# Patient Record
Sex: Female | Born: 1950
Health system: Southern US, Community
[De-identification: ages and names within clinical notes are randomized; demographics above are authoritative.]

## PROBLEM LIST (undated history)

## (undated) DIAGNOSIS — Z8042 Family history of malignant neoplasm of prostate: Secondary | ICD-10-CM

## (undated) DIAGNOSIS — C569 Malignant neoplasm of unspecified ovary: Secondary | ICD-10-CM

## (undated) DIAGNOSIS — J329 Chronic sinusitis, unspecified: Secondary | ICD-10-CM

## (undated) DIAGNOSIS — M779 Enthesopathy, unspecified: Secondary | ICD-10-CM

## (undated) DIAGNOSIS — M1990 Unspecified osteoarthritis, unspecified site: Secondary | ICD-10-CM

## (undated) DIAGNOSIS — C561 Malignant neoplasm of right ovary: Secondary | ICD-10-CM

## (undated) DIAGNOSIS — H04123 Dry eye syndrome of bilateral lacrimal glands: Secondary | ICD-10-CM

## (undated) DIAGNOSIS — K469 Unspecified abdominal hernia without obstruction or gangrene: Secondary | ICD-10-CM

## (undated) DIAGNOSIS — G629 Polyneuropathy, unspecified: Secondary | ICD-10-CM

## (undated) DIAGNOSIS — T451X5A Adverse effect of antineoplastic and immunosuppressive drugs, initial encounter: Principal | ICD-10-CM

## (undated) DIAGNOSIS — I2699 Other pulmonary embolism without acute cor pulmonale: Secondary | ICD-10-CM

## (undated) DIAGNOSIS — Z8051 Family history of malignant neoplasm of kidney: Secondary | ICD-10-CM

## (undated) DIAGNOSIS — I10 Essential (primary) hypertension: Secondary | ICD-10-CM

## (undated) DIAGNOSIS — D6481 Anemia due to antineoplastic chemotherapy: Secondary | ICD-10-CM

## (undated) HISTORY — DX: Essential (primary) hypertension: I10

## (undated) HISTORY — DX: Chronic sinusitis, unspecified: J32.9

## (undated) HISTORY — DX: Family history of malignant neoplasm of prostate: Z80.42

## (undated) HISTORY — DX: Anemia due to antineoplastic chemotherapy: D64.81

## (undated) HISTORY — PX: ROTATOR CUFF REPAIR: SHX139

## (undated) HISTORY — DX: Enthesopathy, unspecified: M77.9

## (undated) HISTORY — DX: Malignant neoplasm of unspecified ovary: C56.9

## (undated) HISTORY — DX: Malignant neoplasm of right ovary: C56.1

## (undated) HISTORY — DX: Adverse effect of antineoplastic and immunosuppressive drugs, initial encounter: T45.1X5A

## (undated) HISTORY — DX: Polyneuropathy, unspecified: G62.9

## (undated) HISTORY — DX: Family history of malignant neoplasm of kidney: Z80.51

## (undated) HISTORY — DX: Unspecified abdominal hernia without obstruction or gangrene: K46.9

## (undated) HISTORY — PX: TONSILLECTOMY: SUR1361

## (undated) HISTORY — DX: Other pulmonary embolism without acute cor pulmonale: I26.99

---

## 1979-01-05 HISTORY — PX: TUBAL LIGATION: SHX77

## 1988-01-05 HISTORY — PX: CHOLECYSTECTOMY: SHX55

## 2002-11-15 ENCOUNTER — Ambulatory Visit (HOSPITAL_COMMUNITY): Admission: RE | Admit: 2002-11-15 | Discharge: 2002-11-15 | Payer: Self-pay | Admitting: Orthopaedic Surgery

## 2006-03-15 ENCOUNTER — Emergency Department (HOSPITAL_COMMUNITY): Admission: EM | Admit: 2006-03-15 | Discharge: 2006-03-15 | Payer: Self-pay | Admitting: Emergency Medicine

## 2008-09-12 ENCOUNTER — Ambulatory Visit (HOSPITAL_COMMUNITY): Admission: RE | Admit: 2008-09-12 | Discharge: 2008-09-12 | Payer: Self-pay | Admitting: Orthopaedic Surgery

## 2010-12-09 ENCOUNTER — Telehealth: Payer: Self-pay

## 2010-12-09 NOTE — Telephone Encounter (Signed)
LMOM for pt to call. 

## 2010-12-22 NOTE — Telephone Encounter (Signed)
Letter mailed to call.  

## 2010-12-23 ENCOUNTER — Ambulatory Visit: Payer: Self-pay | Admitting: Gastroenterology

## 2011-01-06 ENCOUNTER — Ambulatory Visit: Payer: Self-pay | Admitting: Urgent Care

## 2011-01-13 ENCOUNTER — Ambulatory Visit: Payer: Self-pay | Admitting: Urgent Care

## 2011-01-18 ENCOUNTER — Ambulatory Visit: Payer: Self-pay | Admitting: Urgent Care

## 2011-01-21 ENCOUNTER — Other Ambulatory Visit (HOSPITAL_COMMUNITY): Payer: Self-pay | Admitting: Physician Assistant

## 2011-01-21 DIAGNOSIS — Z139 Encounter for screening, unspecified: Secondary | ICD-10-CM

## 2011-01-27 ENCOUNTER — Ambulatory Visit: Payer: Self-pay | Admitting: Gastroenterology

## 2011-01-28 ENCOUNTER — Ambulatory Visit (HOSPITAL_COMMUNITY)
Admission: RE | Admit: 2011-01-28 | Discharge: 2011-01-28 | Disposition: A | Discharge status: Home / Self Care | Payer: BC Managed Care – PPO | Source: Ambulatory Visit | Attending: Physician Assistant | Admitting: Physician Assistant

## 2011-01-28 DIAGNOSIS — Z1231 Encounter for screening mammogram for malignant neoplasm of breast: Secondary | ICD-10-CM | POA: Insufficient documentation

## 2011-01-28 DIAGNOSIS — Z139 Encounter for screening, unspecified: Secondary | ICD-10-CM

## 2011-03-11 ENCOUNTER — Telehealth: Payer: Self-pay

## 2011-03-11 NOTE — Telephone Encounter (Signed)
Letter sent to Dr. Sherwood Gambler in [place] regarding pt's appt. She had 5 appointments for OV scheduled and each one was cancelled.

## 2011-04-21 ENCOUNTER — Telehealth: Payer: Self-pay

## 2011-04-21 NOTE — Telephone Encounter (Signed)
Called pt. She is still not ready to schedule ov for diarrhea and colonoscopy. She is having some GYN problems and going to [place]. When she is ready she will call.

## 2011-05-24 HISTORY — PX: OTHER SURGICAL HISTORY: SHX169

## 2011-05-25 HISTORY — PX: ABDOMINAL HYSTERECTOMY: SHX81

## 2011-06-16 HISTORY — PX: PORTACATH PLACEMENT: SHX2246

## 2011-06-17 ENCOUNTER — Encounter (HOSPITAL_COMMUNITY): Payer: BC Managed Care – PPO | Attending: Hematology and Oncology

## 2011-06-17 ENCOUNTER — Encounter (HOSPITAL_COMMUNITY): Payer: Self-pay

## 2011-06-17 VITALS — BP 111/74 | HR 67 | Temp 98.0°F | Ht 62.0 in | Wt 212.2 lb

## 2011-06-17 DIAGNOSIS — I10 Essential (primary) hypertension: Secondary | ICD-10-CM | POA: Insufficient documentation

## 2011-06-17 DIAGNOSIS — C561 Malignant neoplasm of right ovary: Secondary | ICD-10-CM

## 2011-06-17 DIAGNOSIS — IMO0002 Reserved for concepts with insufficient information to code with codable children: Secondary | ICD-10-CM | POA: Insufficient documentation

## 2011-06-17 DIAGNOSIS — Z86711 Personal history of pulmonary embolism: Secondary | ICD-10-CM | POA: Insufficient documentation

## 2011-06-17 DIAGNOSIS — I2699 Other pulmonary embolism without acute cor pulmonale: Secondary | ICD-10-CM | POA: Insufficient documentation

## 2011-06-17 DIAGNOSIS — C569 Malignant neoplasm of unspecified ovary: Secondary | ICD-10-CM | POA: Insufficient documentation

## 2011-06-17 HISTORY — DX: Malignant neoplasm of right ovary: C56.1

## 2011-06-17 MED ORDER — LIDOCAINE-PRILOCAINE 2.5-2.5 % EX CREA: TOPICAL_CREAM | CUTANEOUS | Status: DC | PRN

## 2011-06-17 MED ORDER — DEXAMETHASONE 4 MG PO TABS: ORAL_TABLET | ORAL | Status: DC

## 2011-06-17 MED ORDER — PREDNISONE (PAK) 10 MG PO TABS: 20.0000 mg | ORAL_TABLET | ORAL | Status: DC

## 2011-06-17 MED ORDER — ONDANSETRON HCL 8 MG PO TABS: ORAL_TABLET | ORAL | Status: DC

## 2011-06-17 NOTE — Patient Instructions (Addendum)
Sarah Walker  [id number]  [facility] Discharge Instructions Sarah Walker  [id number] 1950-01-16 Dr. Glenford Peers    RECOMMENDATIONS MADE BY THE CONSULTANT AND ANY TEST RESULTS WILL BE SENT TO YOUR REFERRING DOCTOR.   EXAM FINDINGS BY MD TODAY AND SIGNS AND SYMPTOMS TO REPORT TO CLINIC OR PRIMARY MD:   Your current list of medications are: Current Outpatient Prescriptions  Medication Sig Dispense Refill  . bisoprolol-hydrochlorothiazide (ZIAC) 5-6.25 MG per tablet Take 1 tablet by mouth daily.      Marland Kitchen HYDROcodone-acetaminophen (NORCO) 5-325 MG per tablet Take 1 tablet by mouth every 8 (eight) hours as needed.      . valsartan (DIOVAN) 80 MG tablet Take 80 mg by mouth daily.      Marland Kitchen enoxaparin (LOVENOX) 150 MG/ML injection Inject 150 mg into the skin daily.       Marland Kitchen warfarin (COUMADIN) 5 MG tablet Starts 6/14         INSTRUCTIONS GIVEN AND DISCUSSED: Please refer to appt list for all appts. Chemo teaching same day as being seen by Dr. Mariel Sleet. Pre and post meds to be called in the same day.   SPECIAL INSTRUCTIONS/FOLLOW-UP:  See above.  I acknowledge that I have been informed and understand all the instructions given to me and received a copy. I do not have any more questions at this time, but understand that I may call the [facility] at [phone] during business hours should I have any further questions or need assistance in obtaining follow-up care.

## 2011-06-17 NOTE — Progress Notes (Signed)
CC:   [name], M.D. Jolayne Haines, M.D. Isabella Stalling II, M.D.  IDENTIFYING STATEMENT:  The patient is a 61 year old woman seen at request of Dr. Marcie Mowers with ovarian cancer.  HISTORY OF PRESENT ILLNESS:  The patient was diagnosed with ovarian cancer and had her initial evaluation and surgery at [facility].  The patient has requested to have treatment closer to home as she lives here in [place].  She is here with her husband to follow up.  In summary, in January 2013, she had presented with the vaginal bleeding.  She was seen by Dr. Regino Schultze at [facility] and a Pap smear at that time was unremarkable.  She was then referred to Dr. Arlana Pouch for further workup.  He performed an endometrial biopsy on 02/02/2011 that showed proliferative phase endometrium but no evidence of malignancy.  She was subsequently placed on a 2-week course of progesterone.  At the followup visit with him, she had complained of pelvic pain with bloating for 2 weeks.  An ultrasound performed at his office had shown enlargement of the right ovary with a complex cyst measuring 16 x 17 x 19 cm in size.  The left ovary was not visualized. The patient was subsequently referred to [facility] for further evaluation.  A CA 125 was elevated.  I do not presently have those results.  She then received a CT scan of the abdomen and pelvis on 05/10/2011 at [facility]  The CT scan did confirm the complex cystic right ovarian mass measuring 17.4 cm x 19 cm x 14 cm. The uterus was normal.  There was a 7 mm left ovarian cyst.  There is no evidence of hydronephrosis.  There was a 5 mm cystic lesion on the anterior superior spleen.  There was no ascites or adenopathy or omental thickening.  The pancreas, adrenal glands and kidneys were within normal limits.  There multiple cystic lesions scattered throughout all lobes of the liver.  The lung bases were clear but  there appeared to be a filling defect in the right lower lobe pulmonary artery.  No definite DVT in the pelvis.  As a result of this, the patient received a chest CT angiography.  This showed bilateral pulmonary emboli with multifocal right lower lobe pulmonary emboli, small right upper lobe pulmonary emboli, lingula and left lower lobe pulmonary emboli.  There was no thoracic adenopathy or pulmonary masses or infiltrates.  The patient was subsequently anticoagulated with Lovenox.  On 05/25/2011, she had an IVC filter placed and then proceeded to receive an exploratory laparotomy with excision of the abdominal pelvic mass with total abdominal hysterectomy, bilateral salpingo-oophorectomy and bilateral pelvic lymph node dissection, right periaortic lymph node dissection and multiple pelvic and abdominal peritoneal biopsies with aspiration of ascites was taken.  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  The capsule was intact.  The cyst was not involved.  Resection margins were negative for carcinoma.  There was no evidence of angiolymphatic invasion, omental or peritoneal metastases.  None of the 17 lymph nodes sampled had evidence of malignancy.  Multiple peritoneal biopsies were negative for carcinoma.  The uterus revealed the cervix with acute and chronic cervicitis, endometrium with simple hyperplasia without atypia and myometrium with adenomyosis.  The patient is recovering well from surgery, has very minimal pain at the surgical incision.  Denies vaginal discharge, is not short of breath and is moving her bowels without difficulty.  She is afebrile.  She is tolerating Lovenox with very minimal complaints.  PAST MEDICAL HISTORY: 1. Hypertension. 2. Status post cholecystectomy. 3. Status post tubal ligation. 4. Status post tonsillectomy. 5. Status post total hysterectomy 05/25/2011 for right ovarian  cancer.  ALLERGIES:  Codeine.  MEDICATIONS:  Ziac 5/6.25 one daily, Lovenox 150 mg daily, Norco 5/325 one daily, Diovan 80 mg daily.  SOCIAL HISTORY:  The patient is married with 2 children, son age 40, daughter age 74.  She lives in [place].  Denies alcohol or tobacco use.  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  FAMILY HISTORY:  Patient's mother had cervical cancer.  Father had renal cancer.  No family history for breast or ovarian cancer.  HEALTH MAINTENANCE:  Due for colonoscopy.  Had a mammogram in January.  REVIEW OF SYSTEMS:  Constitutional:  Denies fever, chills, night sweats, anorexia, or weight loss.  GI:  Denies nausea, vomiting, abdominal pain, diarrhea, melena, hematochezia.  Cardiovascular:  Denies chest pain, PND, orthopnea, ankle swelling.  Respirations:  Denies cough, hemoptysis, wheeze, shortness of breath.  Skin:  No bruising or bleeding.  Neurologic:  Denies headaches, vision changes, extremity weakness.  Besides minimal pain at the surgical site, rest of review of systems negative.  PHYSICAL EXAM:  General:  The patient is a well-appearing, well- nourished, woman in no distress.  Vitals:  Pulse 67, blood pressure 101/74, temperature 98, respirations 20, weight 212 pounds.  HEENT: Head is atraumatic, normocephalic.  Sclerae anicteric.  Pupils equal, round, reactive to light.  Mouth moist without ulcerations, thrush, or lesions.  Neck:  Supple without adenopathy.  Chest:  Clear to both percussion and auscultation.  Chest area revealed a Port-A-Cath recently placed with a little erythema but no discharge.  Cardiovascular:  First and second heart sounds present.  No added sounds or murmurs.  Abdomen: Soft, nontender.  Bowel sounds present.  Well-healed surgical incision. Extremities:  No calf tenderness.  Pulses present symmetrical.  Lymph nodes:  No palpable cervical, axillary, inguinal adenopathy.  CNS: Nonfocal.  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  She is recovering well from surgery.  She presents to discuss and receive adjuvant treatment in the form of carboplatin with an AUC of 5 and Taxol 175 mg/sq m dosed every 3 weeks for approximately 6 cycles.  We discussed the need for therapy to decrease relapse and improve upon survival.  We discussed the logistics and side effects of therapy.  Side effects include but are not limited to infusion-related reactions from Taxol, thus the patient will require to take prednisone 10 mg the night before and the morning off of chemotherapy, both drugs causing myelosuppression, nausea, vomiting, alopecia, dermatologic reactions, diarrhea, renal failure.  She was given literature to review and will also attend chemotherapy teaching. She has already had a Port-A-Cath placed at [facility] and thus will need a chest x-ray to locate placement.  A prescription will be written for antiemetics to be taken after chemotherapy and EMLA cream to be applied to port area prior to therapy.  Chemotherapy has tentatively been scheduled to begin on July 15, 2011.  Prior to that visit, the patient has been scheduled for a followup visit and she will be obtaining a CBC, C-MET and a CA 125.  She also has history of bilateral pulmonary  emboli status post IVC filter placement on 05/24/2011.  She is currently on Lovenox but due to begin Coumadin in a.m.  She tells me that she will be having her PT/INR levels monitored at [facility].  I spent more than half the time coordinating care.    ______________________________ Laurice Record, M.D. LIO/MEDQ  D:  06/17/2011  T:  06/17/2011  Job:  [id number]

## 2011-06-17 NOTE — Progress Notes (Signed)
This office note has been dictated.

## 2011-06-22 ENCOUNTER — Encounter (HOSPITAL_COMMUNITY): Payer: Self-pay | Admitting: Oncology

## 2011-06-22 NOTE — Progress Notes (Signed)
Sarah Walker stating that we get records for her from [facility] so that she does not need to repeat a CXR to confirm port placement as requested by Dr. Dalene Carrow.  Interventional radiology at [facility] contacted [phone]), and report was faxed over from them with the power port-a-cath placement confirmation - a copy was placed in the pt's chart and a copy was sent to be scanned into [facility].

## 2011-06-24 ENCOUNTER — Other Ambulatory Visit (HOSPITAL_COMMUNITY): Payer: Self-pay | Admitting: Oncology

## 2011-06-24 ENCOUNTER — Encounter (HOSPITAL_BASED_OUTPATIENT_CLINIC_OR_DEPARTMENT_OTHER): Payer: BC Managed Care – PPO

## 2011-06-24 DIAGNOSIS — C569 Malignant neoplasm of unspecified ovary: Secondary | ICD-10-CM

## 2011-06-24 DIAGNOSIS — I2699 Other pulmonary embolism without acute cor pulmonale: Secondary | ICD-10-CM

## 2011-06-24 LAB — PROTIME-INR
INR: 2.96 — ABNORMAL HIGH (ref 0.00–1.49)
Prothrombin Time: 31.3 seconds — ABNORMAL HIGH (ref 11.6–15.2)

## 2011-06-24 NOTE — Progress Notes (Signed)
Labs drawn today for pt.  Patient on 5mg .  Call patient on [phone]

## 2011-06-25 ENCOUNTER — Telehealth (HOSPITAL_COMMUNITY): Payer: Self-pay | Admitting: Oncology

## 2011-06-25 NOTE — Telephone Encounter (Signed)
[employer] [phone] ? IF CPT [phone] REQUIRE PER [facility] SERVICES DO NOT REQUIRE PRE Rudy Jew REF# [phone]  April Manson [facility] [phone]

## 2011-06-29 ENCOUNTER — Encounter (HOSPITAL_COMMUNITY): Payer: BC Managed Care – PPO

## 2011-06-29 ENCOUNTER — Other Ambulatory Visit (HOSPITAL_COMMUNITY): Payer: Self-pay | Admitting: Oncology

## 2011-06-29 DIAGNOSIS — I2699 Other pulmonary embolism without acute cor pulmonale: Secondary | ICD-10-CM

## 2011-06-29 DIAGNOSIS — C569 Malignant neoplasm of unspecified ovary: Secondary | ICD-10-CM

## 2011-06-29 LAB — PROTIME-INR
INR: 5.08 (ref 0.00–1.49)
Prothrombin Time: 55 seconds — ABNORMAL HIGH (ref 11.6–15.2)

## 2011-07-01 ENCOUNTER — Encounter (HOSPITAL_BASED_OUTPATIENT_CLINIC_OR_DEPARTMENT_OTHER): Payer: BC Managed Care – PPO

## 2011-07-01 ENCOUNTER — Other Ambulatory Visit (HOSPITAL_COMMUNITY): Payer: Self-pay | Admitting: Oncology

## 2011-07-01 DIAGNOSIS — I2699 Other pulmonary embolism without acute cor pulmonale: Secondary | ICD-10-CM

## 2011-07-01 LAB — PROTIME-INR
INR: 4.24 — ABNORMAL HIGH (ref 0.00–1.49)
Prothrombin Time: 41.4 seconds — ABNORMAL HIGH (ref 11.6–15.2)

## 2011-07-01 NOTE — Progress Notes (Signed)
Pt notified to hold coumadin x 3 more nights then take 2.5 mg on Sunday night. Pt/inr one Monday am

## 2011-07-02 ENCOUNTER — Other Ambulatory Visit (HOSPITAL_COMMUNITY): Payer: Self-pay | Admitting: Oncology

## 2011-07-02 NOTE — Patient Instructions (Signed)
[facility] Sarah Walker  [id number] [birth year] Dr. Glenford Peers    CHEMOTHERAPY INSTRUCTIONS Carboplatin - this medication can be hard on your kidneys - this is why we need you to drink 64 oz of fluid (preferably water/decaff fluids) 2 days prior to chemo and for up to 4-5 days after chemo. Drink more if you can. This will help to keep your kidneys flushed. This can cause mild hair loss, lower your platelets (which make your blood clot), lower your white blood cells (fight infection), and cause nausea/vomiting.    Taxol - the first time you receive this drug we will titrate it very slowly to ensure that you do not have or are not having an allergic reaction to the chemo. You will also be responsible for taking a steroid called Dexamethasone before and after chemo. This is to reduce your risk of having an allergic reaction to the Taxol. Side Effects: hair loss, lowers your white blood cells (fight infection), muscle aches, nausea/vomiting, irritation to the mouth (mouth sores, pain in your mouth) *neuropathy - numbness/tingling/burning in hands/fingers/feet/toes. We need to know as soon as this begins to happen so that we can monitor it and treat if necessary. The numbness generally begins in the fingertips of tips of toes and then begins to travel up the finger/toe/hand/foot. We never want you getting to where you can't pick up a pen, coin, zip a zipper, button a button, or have trouble walking. You must tell us immediately if you are experiencing peripheral neuropathy!   Neulasta - (not chemo) but given 20-24 hours after chemo to boost your body's white blood cells. White blood cells are what protect your body from infection. Chemo kills cancer cells and healthy cells. And so your white blood cells get destroyed in the process. This is why you need the Neulasta injection. The most common side effect reported with this injection is bone pain. Some patients have it and  some do not have it at all. If you develop bone pain, it can occur as soon as a few hours after the injection to several days after the injection. And the bone pain can last several days. Please start taking Aleve or Ibuprofen as prescribed on the bottle for the bone pain. This is the drug of choice. There is no other drug that will work as good as the Aleve or Ibuprofen. Please take this medication with food if you have to take. Also, we need to know if you develop bone pain and what the severity of it is.   POTENTIAL SIDE EFFECTS OF TREATMENT: Increased Susceptibility to Infection, Vomiting, Constipation, Hair Thinning, Changes in Character of Skin and Nails (brittleness, dryness,etc.), Pigment Changes (darkening of veins, nail beds, palms of hands, soles of feet, etc.), Bone Marrow Suppression, Abdominal Cramping, Urinary Frequency, Blood in Urine, Complete Hair Loss, Nausea, Diarrhea, Painful Urination, Pus in Urine, Sun Sensitivity and Mouth Sores   SELF IMAGE NEEDS AND REFERRALS MADE: Obtain hair accessories as soon as possible (wigs, scarves, turbans,caps,etc.) and Referral to Look Good, Feel Better consultant   EDUCATIONAL MATERIALS GIVEN AND REVIEWED: Chemotherapy and You and Specific Instructions Sheets on chemo drugs and pre/post meds   SELF CARE ACTIVITIES WHILE ON CHEMOTHERAPY: Increase your fluid intake 48 hours prior to treatment and drink at least 2 quarts per day after treatment., No alcohol intake., No aspirin or other medications unless approved by your oncologist., Eat foods that are light and easy to digest., Eat foods  at cold or room temperature., No fried, fatty, or spicy foods immediately before or after treatment., Have teeth cleaned professionally before starting treatment. Keep dentures and partial plates clean., Use soft toothbrush and do not use mouthwashes that contain alcohol. Biotene is a good mouthwash that is available at most pharmacies or may be ordered by calling  [phone]., Use warm salt water gargles (1 teaspoon salt per 1 quart warm water) before and after meals and at bedtime. Or you may rinse with 2 tablespoons of three -percent hydrogen peroxide mixed in eight ounces of water., Always use sunscreen with SPF (Sun Protection Factor) of 30 or higher., Use your nausea medication as directed to prevent nausea., Use your stool softener or laxative as directed to prevent constipation. and Use your anti-diarrheal medication as directed to stop diarrhea.  Please wash your hands for at least 30 seconds using warm soapy water. Handwashing is the #1 way to prevent the spread of germs. Stay away from sick people or people who are getting over a cold. If you develop respiratory systems such as green/yellow mucus production or productive cough or persistent cough let us know and we will see if you need an antibiotic. It is a good idea to keep a pair of gloves on when going into grocery stores/[employer] to decrease your risk of coming into contact with germs on the carts, etc. Carry alcohol hand gel with you at all times and use it frequently if out in public. All foods need to be cooked thoroughly. No raw foods. No medium or undercooked meats, eggs. If your food is cooked medium well, it does not need to be hot pink or saturated with bloody liquid at all. Vegetables and fruits need to be washed/rinsed under the faucet with a dish detergent before being consumed. You can eat raw fruits and vegetables unless we tell you otherwise but it would be best if you cooked them or bought frozen. Do not eat off of salad bars or hot bars unless you really trust the cleanliness of the restaurant. If you need dental work, please let Dr. Mariel Sleet know before you go for your appointment so that we can coordinate the best possible time for you in regards to your chemo regimen. You need to also let your dentist know that you are actively taking chemo. We may need to do labs prior to your dental  appointment. We also want your bowels moving at least every other day. If this is not happening, we need to know so that we can get you on a bowel regimen to help you go.     MEDICATIONS:  Dexamethasone 4mg  tablet. Starting the night before chemo, take 2 tablets at 9pm. Then take 2 tablets @ 3am the morning of chemo. Starting the day after chemo, take 2 tablets in the am and 2 tablets in the pm for 3 days. This medication can cause you to be irritable, nervous, jittery, anxious, and to have difficulty sleeping. This medication can also cause you to turn red in your face/neck/chest area. This will go away. You are taking this drug before chemo to lessen your likelihood of developing an allergic reaction to the Taxol. You are taking it after chemo to lessen your likelihood of developing severe nausea/vomiting. Take as prescribed.  Zofran 8mg  tablet. Starting the day after chemo, take 1 tablet in the am and 1 tablet in the pm for 3 days. Then may take up to two times a day if needed for nausea/vomiting.  This medication is to decrease your nausea/vomiting. This medication can also constipate you.   Ativan 1 mg tablet. May take 1 tablet every 4 hours if needed for nausea/vomiting. This medication is being prescribed for its ability to decrease nausea/vomiting. It will make you sleepy. Do not drive while taking this medication. If the whole tablet is too much for you to take (d/t side effect of being sleepy) take 1/2 a tablet. However, if you are severely nauseated we would rather you take the whole tablet and sleep rather than be awake and vomiting.  Compazine 25mg  suppository. Insert 1 rectally every 6 hours if needed for nausea/vomiting. Keep refrigerated until ready to use.   EMLA cream. Apply a quarter sized amount to port site 1 hour prior to chemotherapy. Do not rub in . Cover with plastic.  (All of these nausea medications given to you work differently in your body to prevent and treat  nausea/vomiting. Please stagger the times that you give these medications - and if you need to take these medications often - do so as the prescription tells you to. If this doesn't work -call the [facility] and we will instruct you on a different way to take your medications).  Over-the-Counter Meds that you can take:  Colace 100mg  capsules. May take up to 6 a day. If you need to take more call the Cancer Center. This simply softens your stool.  Senna - S. May take 2 tablets a day. This is a mild laxative.  Milk of Magnesia. May take up to a 1x dose of 8 tablespoons for constipation. If no BM, call the Cancer Center. It is best to start with a lower dose and increase if needed. For example, you may take 1-2 tbsp every 6-8 hours if needed and if no BM increase to 2-4 tbsp every 6-8 hours if needed and if no BM increase to 4-6 tbsp every 6-8 hours. Then may increase to a 1x dose of 8 tbsp.   SYMPTOMS TO REPORT AS SOON AS POSSIBLE AFTER TREATMENT:  FEVER GREATER THAN 100.5 F  CHILLS WITH OR WITHOUT FEVER  NAUSEA AND VOMITING THAT IS NOT CONTROLLED WITH YOUR NAUSEA MEDICATION  UNUSUAL SHORTNESS OF BREATH  UNUSUAL BRUISING OR BLEEDING  TENDERNESS IN MOUTH AND THROAT WITH OR WITHOUT PRESENCE OF ULCERS  URINARY PROBLEMS  BOWEL PROBLEMS  UNUSUAL RASH    Wear comfortable clothing and clothing appropriate for easy access to any Portacath or PICC line. Let us know if there is anything that we can do to make your therapy better!      I have been informed and understand all of the instructions given to me and have received a copy. I have been instructed to call the clinic [phone] or my family physician as soon as possible for continued medical care, if indicated. I do not have any more questions at this time but understand that I may call the Cancer Center or the Patient Navigator at [phone] during office hours should I have questions or need assistance in obtaining  follow-up care.      _________________________________________      _______________     __________ Signature of Patient or Authorized Representative        Date                            Time      _________________________________________ Nurse's Signature

## 2011-07-02 NOTE — Progress Notes (Signed)
meds called into [facility] today. Taxol/carbo consent signed and teaching done.

## 2011-07-05 ENCOUNTER — Encounter (HOSPITAL_COMMUNITY): Payer: Self-pay | Admitting: Oncology

## 2011-07-05 ENCOUNTER — Other Ambulatory Visit (HOSPITAL_COMMUNITY): Payer: Self-pay | Admitting: Oncology

## 2011-07-05 ENCOUNTER — Encounter (HOSPITAL_COMMUNITY): Payer: BC Managed Care – PPO

## 2011-07-05 ENCOUNTER — Encounter (HOSPITAL_COMMUNITY): Payer: BC Managed Care – PPO | Attending: Oncology | Admitting: Oncology

## 2011-07-05 VITALS — BP 100/70 | HR 62 | Temp 97.7°F | Ht 60.75 in | Wt 210.8 lb

## 2011-07-05 DIAGNOSIS — C569 Malignant neoplasm of unspecified ovary: Secondary | ICD-10-CM | POA: Insufficient documentation

## 2011-07-05 DIAGNOSIS — I2699 Other pulmonary embolism without acute cor pulmonale: Secondary | ICD-10-CM | POA: Insufficient documentation

## 2011-07-05 DIAGNOSIS — C561 Malignant neoplasm of right ovary: Secondary | ICD-10-CM

## 2011-07-05 LAB — PROTIME-INR
INR: 1.44 (ref 0.00–1.49)
Prothrombin Time: 17.8 seconds — ABNORMAL HIGH (ref 11.6–15.2)

## 2011-07-05 NOTE — Progress Notes (Signed)
Problem #1 stage I a ovarian cancer status post resection.  Problem #2 history of hypertension  Problem #3 pulmonary emboli on Coumadin and we will continue that for at least 6 months possibly 12 months.  On the saw Dr.Odogwu in consultation who recommended adjuvant chemotherapy with carboplatin and Taxol. She's had a Port-A-Cath placed and is here for chemotherapy teaching and to meet me.  Her vital signs are in the chart. She is in no acute distress. She is alert and oriented. Her Port-A-Cath is intact. Her lungs are clear. Lymph nodes are negative throughout. Heart shows a regular rhythm and rate without murmur rub or gallop. Abdomen is obese but without obvious masses or ascites. Her incision is healing nicely. She has no peripheral edema.  Her review of systems is positive for vaginal fluid discharge starting Friday continuing partially in the Saturday early Sunday and disappearing Sunday night and no longer present today. It was not foul-smelling it was clear in color she states. She has not been running fevers or chills.  Other than that her review of systems oncologicall he is negative. She of course was also treated for her pulmonary emboli and we will continue that therapy. Her INR was high last week, he is low today. She was on 5 mg we will change her to 5 mg alternating with 2.5 mg. Her next INR will be this Friday and we will do her pre-chemotherapy blood work then. We will see her in 3 weeks but I agree with Dr. Dalene Carrow that we should aim for 6 cycles of chemotherapy and a minimum of 3- 4 unless she has severe toxicity. She is agreeable to this plan

## 2011-07-05 NOTE — Progress Notes (Signed)
Patient to alternate 5/2.5 daily til next INR

## 2011-07-05 NOTE — Progress Notes (Signed)
  Sarah Walker presented for labwork. Labs per MD order drawn via Peripheral Line 25 gauge needle inserted in rt ac.  Pt held coumadin thursday- fri-sat- and took 2.5mg  on Sunday Procedure without incident.  Needle removed intact. Patient tolerated procedure well.

## 2011-07-09 ENCOUNTER — Encounter (HOSPITAL_BASED_OUTPATIENT_CLINIC_OR_DEPARTMENT_OTHER): Payer: BC Managed Care – PPO

## 2011-07-09 ENCOUNTER — Telehealth (HOSPITAL_COMMUNITY): Payer: Self-pay

## 2011-07-09 ENCOUNTER — Other Ambulatory Visit (HOSPITAL_COMMUNITY): Payer: BC Managed Care – PPO

## 2011-07-09 DIAGNOSIS — I2699 Other pulmonary embolism without acute cor pulmonale: Secondary | ICD-10-CM

## 2011-07-09 DIAGNOSIS — C569 Malignant neoplasm of unspecified ovary: Secondary | ICD-10-CM

## 2011-07-09 LAB — COMPREHENSIVE METABOLIC PANEL
ALT: 15 U/L (ref 0–35)
AST: 15 U/L (ref 0–37)
Albumin: 3.1 g/dL — ABNORMAL LOW (ref 3.5–5.2)
Alkaline Phosphatase: 59 U/L (ref 39–117)
BUN: 14 mg/dL (ref 6–23)
CO2: 27 mEq/L (ref 19–32)
Calcium: 9 mg/dL (ref 8.4–10.5)
Chloride: 106 mEq/L (ref 96–112)
Creatinine, Ser: 0.69 mg/dL (ref 0.50–1.10)
GFR calc Af Amer: >90 mL/min (ref >90)
GFR calc non Af Amer: >90 mL/min (ref >90)
Glucose, Bld: 88 mg/dL (ref 70–99)
Potassium: 3.9 mEq/L (ref 3.5–5.1)
Sodium: 141 mEq/L (ref 135–145)
Total Bilirubin: 0.2 mg/dL — ABNORMAL LOW (ref 0.3–1.2)
Total Protein: 6.2 g/dL (ref 6.0–8.3)

## 2011-07-09 LAB — DIFFERENTIAL
Basophils Absolute: 0 10*3/uL (ref 0.0–0.1)
Basophils Relative: 0 % (ref 0–1)
Eosinophils Absolute: 0.2 10*3/uL (ref 0.0–0.7)
Eosinophils Relative: 5 % (ref 0–5)
Lymphocytes Relative: 37 % (ref 12–46)
Lymphs Abs: 1.5 10*3/uL (ref 0.7–4.0)
Monocytes Absolute: 0.3 10*3/uL (ref 0.1–1.0)
Monocytes Relative: 8 % (ref 3–12)
Neutro Abs: 2.1 10*3/uL (ref 1.7–7.7)
Neutrophils Relative %: 50 % (ref 43–77)

## 2011-07-09 LAB — PROTIME-INR
INR: 2.29 — ABNORMAL HIGH (ref 0.00–1.49)
Prothrombin Time: 25.6 seconds — ABNORMAL HIGH (ref 11.6–15.2)

## 2011-07-09 LAB — CBC
HCT: 36.9 % (ref 36.0–46.0)
Hemoglobin: 11.8 g/dL — ABNORMAL LOW (ref 12.0–15.0)
MCH: 28.8 pg (ref 26.0–34.0)
MCHC: 32 g/dL (ref 30.0–36.0)
MCV: 90 fL (ref 78.0–100.0)
Platelets: 203 10*3/uL (ref 150–400)
RBC: 4.1 MIL/uL (ref 3.87–5.11)
RDW: 15.3 % (ref 11.5–15.5)
WBC: 4.1 10*3/uL (ref 4.0–10.5)

## 2011-07-09 LAB — CA 125: CA 125: 165.1 U/mL — ABNORMAL HIGH (ref 0.0–30.2)

## 2011-07-09 NOTE — Telephone Encounter (Signed)
Patient instructed to continue coumadin 5mg  alternating with 2.5mg  and to return for next PT/INR on 07/15/11.  Verbalizes understanding.

## 2011-07-09 NOTE — Progress Notes (Signed)
Lab draw

## 2011-07-15 ENCOUNTER — Encounter (HOSPITAL_BASED_OUTPATIENT_CLINIC_OR_DEPARTMENT_OTHER): Payer: BC Managed Care – PPO

## 2011-07-15 DIAGNOSIS — Z5111 Encounter for antineoplastic chemotherapy: Secondary | ICD-10-CM

## 2011-07-15 DIAGNOSIS — I2699 Other pulmonary embolism without acute cor pulmonale: Secondary | ICD-10-CM

## 2011-07-15 DIAGNOSIS — C569 Malignant neoplasm of unspecified ovary: Secondary | ICD-10-CM

## 2011-07-15 LAB — PROTIME-INR: INR: 3.34 — ABNORMAL HIGH (ref 0.00–1.49)

## 2011-07-15 MED ORDER — SODIUM CHLORIDE 0.9 % IV SOLN
Freq: Once | INTRAVENOUS | Status: AC
  Administered 2011-07-15: 16 mg via INTRAVENOUS
  Filled 2011-07-15: qty 8

## 2011-07-15 MED ORDER — DEXAMETHASONE SODIUM PHOSPHATE 4 MG/ML IJ SOLN: 20.0000 mg | Freq: Once | INTRAMUSCULAR | Status: DC

## 2011-07-15 MED ORDER — SODIUM CHLORIDE 0.9 % IJ SOLN
10.0000 mL | INTRAMUSCULAR | Status: DC | PRN
  Filled 2011-07-15: qty 10

## 2011-07-15 MED ORDER — SODIUM CHLORIDE 0.9 % IV SOLN
750.0000 mg | Freq: Once | INTRAVENOUS | Status: AC
  Administered 2011-07-15: 750 mg via INTRAVENOUS
  Filled 2011-07-15: qty 75

## 2011-07-15 MED ORDER — FAMOTIDINE IN NACL 20-0.9 MG/50ML-% IV SOLN
INTRAVENOUS | Status: AC
  Filled 2011-07-15: qty 50

## 2011-07-15 MED ORDER — SODIUM CHLORIDE 0.9 % IV SOLN
Freq: Once | INTRAVENOUS | Status: AC
  Administered 2011-07-15: 09:00:00 via INTRAVENOUS

## 2011-07-15 MED ORDER — SODIUM CHLORIDE 0.9 % IJ SOLN
INTRAMUSCULAR | Status: AC
  Filled 2011-07-15: qty 10

## 2011-07-15 MED ORDER — SODIUM CHLORIDE 0.9 % IV SOLN: 16.0000 mg | Freq: Once | INTRAVENOUS | Status: DC

## 2011-07-15 MED ORDER — FAMOTIDINE IN NACL 20-0.9 MG/50ML-% IV SOLN
20.0000 mg | Freq: Once | INTRAVENOUS | Status: AC
  Administered 2011-07-15: 20 mg via INTRAVENOUS

## 2011-07-15 MED ORDER — DIPHENHYDRAMINE HCL 50 MG/ML IJ SOLN
50.0000 mg | Freq: Once | INTRAMUSCULAR | Status: AC
  Administered 2011-07-15: 50 mg via INTRAVENOUS

## 2011-07-15 MED ORDER — HEPARIN SOD (PORK) LOCK FLUSH 100 UNIT/ML IV SOLN
INTRAVENOUS | Status: AC
  Filled 2011-07-15: qty 5

## 2011-07-15 MED ORDER — DIPHENHYDRAMINE HCL 50 MG/ML IJ SOLN
INTRAMUSCULAR | Status: AC
  Filled 2011-07-15: qty 1

## 2011-07-15 MED ORDER — PACLITAXEL CHEMO INJECTION 300 MG/50ML
175.0000 mg/m2 | Freq: Once | INTRAVENOUS | Status: AC
  Administered 2011-07-15: 360 mg via INTRAVENOUS
  Filled 2011-07-15: qty 60

## 2011-07-15 MED ORDER — HEPARIN SOD (PORK) LOCK FLUSH 100 UNIT/ML IV SOLN
500.0000 [IU] | Freq: Once | INTRAVENOUS | Status: AC | PRN
  Administered 2011-07-15: 500 [IU]
  Filled 2011-07-15: qty 5

## 2011-07-15 NOTE — Progress Notes (Signed)
Taxol infusion infusing and tolerating well.  Titrated and vitals taken per protocol.  Vitals as follows:  5 minutes-97.5, 64, 16, 111/69, 15 minutes-97.3, 61, 18, 123/68, 30 minutes-97.6, 52, 18, 116/63, 45 minutes-98.0, 67, 18, 102/56.  Infusion running at full rate at this time.     Coumadin addressed with patient.  Written instructions given to patient about Coumadin dosage changes and follow up labs.

## 2011-07-16 ENCOUNTER — Other Ambulatory Visit: Payer: Self-pay | Admitting: Certified Registered Nurse Anesthetist

## 2011-07-16 ENCOUNTER — Encounter (HOSPITAL_BASED_OUTPATIENT_CLINIC_OR_DEPARTMENT_OTHER): Payer: BC Managed Care – PPO

## 2011-07-16 ENCOUNTER — Other Ambulatory Visit (HOSPITAL_COMMUNITY): Payer: Self-pay | Admitting: Oncology

## 2011-07-16 VITALS — BP 128/78 | HR 60

## 2011-07-16 DIAGNOSIS — C569 Malignant neoplasm of unspecified ovary: Secondary | ICD-10-CM

## 2011-07-16 DIAGNOSIS — Z5189 Encounter for other specified aftercare: Secondary | ICD-10-CM

## 2011-07-16 MED ORDER — PEGFILGRASTIM INJECTION 6 MG/0.6ML
6.0000 mg | Freq: Once | SUBCUTANEOUS | Status: AC
  Administered 2011-07-16: 6 mg via SUBCUTANEOUS

## 2011-07-16 NOTE — Progress Notes (Signed)
Tolerated injection well. 

## 2011-07-19 ENCOUNTER — Telehealth (HOSPITAL_COMMUNITY): Payer: Self-pay | Admitting: *Deleted

## 2011-07-19 NOTE — Telephone Encounter (Signed)
Dr. Mariel Sleet wants patient to call me next Monday July 22 and let me know how bad the pain is in her feet. She was instructed that she could take her nabumetone and that she was not to rub her feet in anything that trapped heat such as vaseline, to avoid friction and heat to her feet, to wear loose fitting shoes that allowed her feet plenty of room, and to wear cotton socks. I told patient that if her feet felt like the linament was making her feet hot to quit using it. Patient verbalized understanding of all of the above instructions.

## 2011-07-19 NOTE — Telephone Encounter (Signed)
I talked to patient on Saturday and today regarding post chemo follow up. Patient was having some pain on Saturday all over and wanted to know if it was safe for her to take her Hydrocodone in which I told her yes. She was otherwise doing good. I talked to her this am and she was wondering if she could take her pain med prescribed by Dr. Hilda Lias called nabumetone 750mg  bid. I passed this along to Dr. Mariel Sleet. She states that her pain is settling in from her knees down to her feet. Her feet pain was rated at a 9/10 on Sunday pm and it had her in tears. Her pain was bad whether she had her feet elevated or putting pressure on them. Today the pain in her feet is a 5/10 at the moment. Her husband rubbed a linament-type rub on them this am. She is currently at work. No other symptoms that she has voiced. No nausea, etc. I am awaiting a response from Dr. Mariel Sleet regarding the foot pain & the nabumetone medication.

## 2011-07-21 ENCOUNTER — Ambulatory Visit (HOSPITAL_COMMUNITY): Payer: BC Managed Care – PPO | Attending: Oncology

## 2011-07-21 ENCOUNTER — Telehealth (HOSPITAL_COMMUNITY): Payer: Self-pay | Admitting: *Deleted

## 2011-07-21 ENCOUNTER — Telehealth (HOSPITAL_COMMUNITY): Payer: Self-pay

## 2011-07-21 VITALS — BP 114/84 | HR 78 | Temp 98.0°F | Wt 205.3 lb

## 2011-07-21 DIAGNOSIS — I2699 Other pulmonary embolism without acute cor pulmonale: Secondary | ICD-10-CM

## 2011-07-21 DIAGNOSIS — C569 Malignant neoplasm of unspecified ovary: Secondary | ICD-10-CM

## 2011-07-21 NOTE — Progress Notes (Signed)
CRITICAL VALUE ALERT Critical value received: INR of 5.62   /Date of notification:  07/21/11 Time of notification: 10:24am Critical value read back:  yes Nurse who received alert:  Tobie Lords, RN MD notified (1st page):  Spoke with PA and instructed to call in  Vitamin K 10 mg and Hold coumadin x 2 days and to repeat PT/INR in 2 days.

## 2011-07-21 NOTE — Telephone Encounter (Signed)
Patient notified that INR was 5.2 and that she needed to hold her coumadin for 2 days and to pick up Vitamin K 10 mg from her pharmacy and take as soon as she gets it and to return on 7/19 for next INR as instructed by Dellis Anes, PA.

## 2011-07-21 NOTE — Progress Notes (Signed)
Current dose of Coumadin 2.5mg /2.5mg /5mg . Pt states she missed 5mg  dose last night. Reports she just wasn't feeling good and forgot. Complaints post chemo are "puffy" feet and fingertips are numb. Appetite and fluid intake are good. States she doesn't feel well and is maybe a little tired because of that.

## 2011-07-21 NOTE — Telephone Encounter (Signed)
Sarah Walker has been keeping me informed on her pain in her feet status. As of today, her feet are feeling puffy "like she is walking on cotton" and the pain is a 5/10. Pain is better when she is off of them. Also, she has developed numbness in her fingers that is "aggravating". It is not affecting her ability to pick of things but she does have to pay extra special attention to her writing so "she doesn't mess up".   Just letting you know!

## 2011-07-23 ENCOUNTER — Other Ambulatory Visit (HOSPITAL_COMMUNITY): Payer: Self-pay | Admitting: Oncology

## 2011-07-23 ENCOUNTER — Telehealth (HOSPITAL_COMMUNITY): Payer: Self-pay

## 2011-07-23 ENCOUNTER — Encounter (HOSPITAL_BASED_OUTPATIENT_CLINIC_OR_DEPARTMENT_OTHER): Payer: BC Managed Care – PPO

## 2011-07-23 DIAGNOSIS — I2699 Other pulmonary embolism without acute cor pulmonale: Secondary | ICD-10-CM

## 2011-07-23 LAB — PROTIME-INR: Prothrombin Time: 13.7 seconds (ref 11.6–15.2)

## 2011-07-23 NOTE — Progress Notes (Signed)
Labs drawn today for pt.  Patient has been off coud for 3days.  Call patient at [phone]

## 2011-07-23 NOTE — Telephone Encounter (Signed)
Patient  instructed to restart coumadin @ 2.5 mg each evening and to return for next PT/INR on 07/28/11.  Verbalizes understanding.

## 2011-07-26 ENCOUNTER — Encounter (HOSPITAL_BASED_OUTPATIENT_CLINIC_OR_DEPARTMENT_OTHER): Payer: BC Managed Care – PPO | Admitting: Oncology

## 2011-07-26 VITALS — BP 114/76 | HR 86 | Temp 98.4°F | Wt 210.4 lb

## 2011-07-26 DIAGNOSIS — I2699 Other pulmonary embolism without acute cor pulmonale: Secondary | ICD-10-CM

## 2011-07-26 DIAGNOSIS — C569 Malignant neoplasm of unspecified ovary: Secondary | ICD-10-CM

## 2011-07-26 MED ORDER — HYDROCODONE-ACETAMINOPHEN 5-325 MG PO TABS: 1.0000 | ORAL_TABLET | Freq: Three times a day (TID) | ORAL | Status: DC | PRN

## 2011-07-26 MED ORDER — WARFARIN SODIUM 5 MG PO TABS: 5.0000 mg | ORAL_TABLET | Freq: Every day | ORAL | Status: DC

## 2011-07-26 NOTE — Progress Notes (Signed)
Pt states that she did have another gush of water from her vagina that lasted a half a day. This happened a few weeks ago but lasted over a period of 2-3 days. Upon returning to Dr. Raford Pitcher, he said that this was due to cutting out the lymph nodes. The fluid gathers in your body and has to be expelled from somewhere and it evidently gathered and was expelled through the vagina. He said that this could happen again. Patient noticed this when pushing to have a BM.

## 2011-07-26 NOTE — Patient Instructions (Signed)
[facility]  Discharge Instructions Sarah Walker  [id number] [birth year] Dr. Glenford Peers    RECOMMENDATIONS MADE BY THE CONSULTANT AND ANY TEST RESULTS WILL BE SENT TO YOUR REFERRING DOCTOR.  Avoid buffets and fresh vegetables especially around the 7-10 days after chemo.   Handwash!!!!!! Carry alcohol hand gel around or wash your hands for at least 30 seconds with warm soap and water after coming into contact with people.   We want you to take all 6 cycles of chemo. This is the goal.  We will especially be looking at the cancer marker (Ca 125) after the 3rd cycle of chemo. Sometimes after the 1st or 2nd cycle of chemo the proteins in the cancer cells get released into the bloodstream as the cancer cell is dying and this is why the Ca 125 increases.   Return August 1 for chemo and labs.  I acknowledge that I have been informed and understand all the instructions given to me and received a copy. I do not have any more questions at this time, but understand that I may call the Specialty Clinic at [facility] at [phone] during business hours should I have any further questions or need assistance in obtaining follow-up care.    __________________________________________  _____________  __________ Signature of Patient or Authorized Representative            Date                   Time    __________________________________________ Nurse's Signature

## 2011-07-26 NOTE — Progress Notes (Signed)
[name], MD [address] [place] [address]  1. Ovarian cancer     CURRENT THERAPY: S/P Cycle 1 Carbo/Taxol followed by Neulasta support. Started on 07/15/2011  INTERVAL HISTORY: Sarah Walker 61 y.o. female returns for  regular  visit for followup of Stage IA Ovarian Ca.  S/P total hysterectomy and now undergoing systemic chemotherapy  With Carbo/Taxol.  In summary, in January 2013, she had presented with the vaginal bleeding. She was seen by Dr. Regino Schultze at [facility] and a Pap smear at that time was unremarkable. She was then referred to Dr. Arlana Pouch for further workup. He performed an endometrial biopsy on 02/02/2011 that showed proliferative phase endometrium but no evidence of malignancy. She was subsequently placed on a 2-week course of progesterone. At the followup visit with him, she had complained of pelvic pain with bloating for 2 weeks. An ultrasound performed at his office had shown enlargement of the right ovary with a complex cyst measuring 16 x 17 x 19 cm in size. The left ovary was not visualized. The patient was subsequently referred to [facility] for further evaluation. A CA 125 was elevated. She then received a CT scan of the abdomen and pelvis on 05/10/2011 at [facility]. The CT scan did confirm the complex cystic right ovarian mass measuring 17.4 cm x 19 cm x 14 cm. The uterus was normal. There was a 7 mm left ovarian cyst. There is no evidence of hydronephrosis. There was a 5 mm cystic lesion on the anterior superior spleen. There was no ascites or adenopathy or omental thickening. The pancreas, adrenal glands and kidneys were within normal limits. There multiple cystic lesions scattered throughout all lobes of the liver. The lung bases were clear but there appeared to be a filling defect in the right lower lobe pulmonary artery. No definite DVT in the pelvis. As a result of this, the patient received a chest CT  angiography. This showed bilateral pulmonary emboli with multifocal right lower lobe pulmonary emboli, small right upper lobe pulmonary emboli, lingula and left lower lobe pulmonary emboli. There was no thoracic adenopathy or pulmonary masses or infiltrates. The patient was subsequently anticoagulated with Lovenox. On 05/25/2011, she had an IVC filter placed and then proceeded to receive an exploratory laparotomy with excision of the abdominal pelvic mass with total abdominal hysterectomy, bilateral salpingo-oophorectomy and bilateral pelvic lymph node dissection, right periaortic lymph node dissection and multiple pelvic and abdominal peritoneal biopsies with aspiration of ascites was taken. 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 The capsule was intact. The cyst was not involved. Resection margins were negative for carcinoma. There was no evidence of angiolymphatic invasion, omental or peritoneal metastases. None of the 17 lymph nodes sampled had evidence of malignancy. Multiple peritoneal biopsies were negative for carcinoma. The uterus revealed the cervix with acute and chronic cervicitis, endometrium with simple hyperplasia without atypia and myometrium with adenomyosis.   Jesseca has a number of issues that we will address today.  1. Unfortunately she has some fleeting lites on her back. There are multiple erythematous small lesions. Not appear to be infected. I've encouraged the patient to have her husband notify me of the to these areas to prevent any infection. He also has sleep it on his lower extremities. Their dog was recently treated for fleas.    2. The patient reports that after Neulasta, she had difficulty with seeing. She felt  as though she was in the daze.  This is likely the dexamethasone induced and not from Neulasta. Therefore we will decrease her by mouth intake of dexamethasone for future cycles. She will  continue with the same dexamethasone regimen prior to chemotherapy. She will continue to get dexamethasone zone at the same dose twice a day the day of therapy. However, we will decrease her oral intake of dexamethasone following chemotherapy administration. She will decrease to 12 mg daily (compared to 60 mg daily). She was doing this for 3 days and we will decrease this to 2 days. Therefore, her dexamethasone regimen will change following chemotherapy administration to 12 mg daily for 2 days. She does admit to some muscle soreness which was relieved with anti-inflammatories such as Aleve and ibuprofen.  3. She requests a refill on her warfarin and hydrocodone. A hydrocodone prescription was provided to her with 1 refill #30 and her warfarin was E. scribed to [facility].  4. She admits to an episode of vaginal leaking. This was her second episode but only lasted one half of a day. She followed up with Dr. Raford Pitcher, at [facility], and he reported to her that this could happen. According to patient, Dr. Raford Pitcher told her this was lymphatic build up and he is not surprised that this happened. Discharge from her vagina was clear without any smell.  5. She has some peripheral neuropathy that occurred following her first chemotherapy administration. It has not resolved to date. She reports it the peripheral neuropathy is localized to her fingertips and tips of toes. She was tested today, and she is able to button the buttons of her blouse on her own without any difficulties. I explained to her this is a side effect of the Taxol chemotherapy and is to be expected. We'll keep a close eye on her peripheral neuropathy. Right now it is a grade 1 peripheral neuropathy.  I have asked her to inform us of her peripheral neuropathy before each cycle of chemotherapy.  6.  She wants to know if she needs to take all 6 cycles of chemotherapy. We spent some time reviewing her laboratory work.  I personally reviewed and  went over laboratory results with the patient.  Her CEA 125 was impressive and elevated at 165.  With this information, I informed her that despite having surgery, there remains some cancerous cells within her body and therefore she will require 6 full cycles chemotherapy. She is agreeable to this.  We spent some time going over education regarding the nature of her chemotherapy regimen. An informed her that between day 7 and 10 return later at this point time she will require superb hand hygiene and neutropenic diet. She was also informed to avoid large crowds particular people who are sick. She is also asked to avoid buffet foods.  I've also provided the patient with education regarding Neulasta. I explained how this can affect her nadir and hopefully keep it from being severe and shortened the light of her nadir.  I also provided patient with education regarding her CA 125.   We will see the patient in 3 weeks. She would like to continue getting her prechemotherapy laboratory work the day of therapy. I place those orders for her prechemotherapy laboratory work. She is due for her cycle 2 on 08/05/2011. We will push along with 6 full cycles of chemotherapy.   Past Medical History  Diagnosis Date  . Allergic rhinitis   . Sinusitis   . Hypertension   .  Ovarian cancer   . Pulmonary embolism     has Ovarian cancer; Hypertension; H/O hysterectomy with oophorectomy; and Pulmonary embolism on her problem list.     is allergic to codeine.  Ms. Strough does not currently have medications on file.  Past Surgical History  Procedure Date  . Tonsillectomy     age 78's  . Cholecystectomy 1990  . Abdominal hysterectomy 05/25/2011    [place] bs&o and cancer staging  . Tubal ligation 1981  . Portacath placement 06/16/11    Done at [facility]  . Ivc filter 05/24/11    Denies any headaches, dizziness, double vision, fevers, chills, night sweats, nausea, vomiting, diarrhea, constipation, chest pain,  heart palpitations, shortness of breath, blood in stool, black tarry stool, urinary pain, urinary burning, urinary frequency, hematuria.   PHYSICAL EXAMINATION  ECOG PERFORMANCE STATUS: 1 - Symptomatic but completely ambulatory  Filed Vitals:   07/26/11 1335  BP: 114/76  Pulse: 86  Temp: 98.4 F (36.9 C)    GENERAL:alert, no distress, well nourished, well developed, comfortable, cooperative, obese and smiling SKIN: skin color, texture, turgor are normal, no rashes or significant lesions HEAD: Normocephalic, No masses, lesions, tenderness or abnormalities EYES: normal, Conjunctiva are pink and non-injected EARS: External ears normal OROPHARYNX:lips, buccal mucosa, and tongue normal and mucous membranes are moist  NECK: supple, no adenopathy, trachea midline LYMPH:  no palpable lymphadenopathy, no hepatosplenomegaly BREAST:not examined LUNGS: clear to auscultation and percussion HEART: regular rate & rhythm, no murmurs, no gallops, S1 normal and S2 normal ABDOMEN:abdomen soft, non-tender, obese, normal bowel sounds, no masses or organomegaly, abdominal incision is well-healed and no hepatosplenomegaly BACK: Back symmetric, no curvature., No CVA tenderness.  Multiple erythematous lesions that do not appear to be infected and are healing. EXTREMITIES:less then 2 second capillary refill, no joint deformities, effusion, or inflammation, no edema, no skin discoloration, no clubbing, no cyanosis  NEURO: alert & oriented x 3 with fluent speech, no focal motor/sensory deficits, gait normal    LABORATORY DATA: CBC    Component Value Date/Time   WBC 4.1 07/09/2011 0822   RBC 4.10 07/09/2011 0822   HGB 11.8* 07/09/2011 0822   HCT 36.9 07/09/2011 0822   PLT 203 07/09/2011 0822   MCV 90.0 07/09/2011 0822   MCH 28.8 07/09/2011 0822   MCHC 32.0 07/09/2011 0822   RDW 15.3 07/09/2011 0822   LYMPHSABS 1.5 07/09/2011 0822   MONOABS 0.3 07/09/2011 0822   EOSABS 0.2 07/09/2011 0822   BASOSABS 0.0 07/09/2011 0822       Chemistry      Component Value Date/Time   NA 141 07/09/2011 0822   K 3.9 07/09/2011 0822   CL 106 07/09/2011 0822   CO2 27 07/09/2011 0822   BUN 14 07/09/2011 0822   CREATININE 0.69 07/09/2011 0822      Component Value Date/Time   CALCIUM 9.0 07/09/2011 0822   ALKPHOS 59 07/09/2011 0822   AST 15 07/09/2011 0822   ALT 15 07/09/2011 0822   BILITOT 0.2* 07/09/2011 0822     Lab Results  Component Value Date   CA125 165.1* 07/09/2011       ASSESSMENT:  1. Stage I a ovarian cancer status post resection with elevated CA 125 (165.1) at baseline before chemotherapy administration.  2. History of hypertension  3. Pulmonary emboli on Coumadin and we will continue that for at least 6 months possibly 12 months. 4. Grade 1 peripheral neuropathy 5. Flea bites on back, non-infected 6. Steroid induced worsened  vision, resolved.   PLAN:  1. I personally reviewed and went over laboratory results with the patient. 2. Patient education regarding her CA 125 and our recommendation for 6 full cycles of chemotherapy.  3. Patient education regarding her steroid regimen 4. Patient educated regarding side effects of high dose Decadron 5. Patient education regarding Neulasta and its role in her therapy.  6. Patient education regarding chemotherapy nadir. 7. Recommended neosporin to flea bites to back.  8. Pre-chemo lab work ordered: CBC diff, BMET, CA-125 9. Decrease Decadron regimen.  All pre-chemo Decadron and day of therapy decadron will remain unaltered.  PO Decadron following chemotherapy will be reduced to 12 mg PO daily (compared to 16 mg) and for two days following chemotherapy (compared to 3 days).  10. Return in 3 weeks for follow-up.  We will move on with cycle 2 of chemotherapy as scheduled.  11. Patient education regarding peripheral neuropathy and she is asked to keep informing us of her peripheral neuropathy.   All questions were answered. The patient knows to call the clinic with any problems,  questions or concerns. We can certainly see the patient much sooner if necessary.  The patient and plan discussed with Glenford Peers, MD and he is in agreement with the aforementioned.  I spent 30 minutes counseling the patient face to face. The total time spent in the appointment was 40 minutes.  Kaleea Penner

## 2011-07-28 ENCOUNTER — Other Ambulatory Visit (HOSPITAL_COMMUNITY): Payer: Self-pay | Admitting: Oncology

## 2011-07-28 ENCOUNTER — Encounter (HOSPITAL_BASED_OUTPATIENT_CLINIC_OR_DEPARTMENT_OTHER): Payer: BC Managed Care – PPO

## 2011-07-28 ENCOUNTER — Other Ambulatory Visit (HOSPITAL_COMMUNITY): Payer: BC Managed Care – PPO

## 2011-07-28 DIAGNOSIS — I2699 Other pulmonary embolism without acute cor pulmonale: Secondary | ICD-10-CM

## 2011-07-28 NOTE — Progress Notes (Signed)
Labs drawn today for pt.  Patient on 2.5mg  of coud.  Call patient at [phone]

## 2011-08-02 ENCOUNTER — Encounter (HOSPITAL_BASED_OUTPATIENT_CLINIC_OR_DEPARTMENT_OTHER): Payer: BC Managed Care – PPO

## 2011-08-02 ENCOUNTER — Other Ambulatory Visit (HOSPITAL_COMMUNITY): Payer: Self-pay | Admitting: Oncology

## 2011-08-02 DIAGNOSIS — I2699 Other pulmonary embolism without acute cor pulmonale: Secondary | ICD-10-CM

## 2011-08-02 LAB — PROTIME-INR
INR: 2.19 — ABNORMAL HIGH (ref 0.00–1.49)
Prothrombin Time: 24.7 seconds — ABNORMAL HIGH (ref 11.6–15.2)

## 2011-08-02 NOTE — Progress Notes (Signed)
Labs drawn today for pt 

## 2011-08-05 ENCOUNTER — Encounter (HOSPITAL_COMMUNITY): Payer: BC Managed Care – PPO

## 2011-08-05 ENCOUNTER — Other Ambulatory Visit (HOSPITAL_COMMUNITY): Payer: Self-pay | Admitting: Oncology

## 2011-08-05 ENCOUNTER — Encounter (HOSPITAL_COMMUNITY): Payer: BC Managed Care – PPO | Attending: Oncology

## 2011-08-05 VITALS — BP 107/70 | HR 76 | Temp 98.1°F | Ht 60.75 in | Wt 209.4 lb

## 2011-08-05 DIAGNOSIS — C569 Malignant neoplasm of unspecified ovary: Secondary | ICD-10-CM | POA: Insufficient documentation

## 2011-08-05 DIAGNOSIS — G609 Hereditary and idiopathic neuropathy, unspecified: Secondary | ICD-10-CM | POA: Insufficient documentation

## 2011-08-05 DIAGNOSIS — I2699 Other pulmonary embolism without acute cor pulmonale: Secondary | ICD-10-CM

## 2011-08-05 DIAGNOSIS — Z5111 Encounter for antineoplastic chemotherapy: Secondary | ICD-10-CM

## 2011-08-05 DIAGNOSIS — Z7901 Long term (current) use of anticoagulants: Secondary | ICD-10-CM | POA: Insufficient documentation

## 2011-08-05 DIAGNOSIS — I10 Essential (primary) hypertension: Secondary | ICD-10-CM | POA: Insufficient documentation

## 2011-08-05 LAB — BASIC METABOLIC PANEL
CO2: 25 mEq/L (ref 19–32)
Calcium: 9.4 mg/dL (ref 8.4–10.5)
Chloride: 103 mEq/L (ref 96–112)
GFR calc Af Amer: >90 mL/min (ref >90)
GFR calc non Af Amer: >90 mL/min (ref >90)
Glucose, Bld: 144 mg/dL — ABNORMAL HIGH (ref 70–99)
Potassium: 3.6 mEq/L (ref 3.5–5.1)
Sodium: 138 mEq/L (ref 135–145)

## 2011-08-05 LAB — CBC
MCV: 86 fL (ref 78.0–100.0)
Platelets: 248 10*3/uL (ref 150–400)
RBC: 3.64 MIL/uL — ABNORMAL LOW (ref 3.87–5.11)
WBC: 3.6 10*3/uL — ABNORMAL LOW (ref 4.0–10.5)

## 2011-08-05 LAB — CA 125: CA 125: 193.1 U/mL — ABNORMAL HIGH (ref 0.0–30.2)

## 2011-08-05 LAB — DIFFERENTIAL
Eosinophils Relative: 0 % (ref 0–5)
Lymphocytes Relative: 16 % (ref 12–46)
Lymphs Abs: 0.6 10*3/uL — ABNORMAL LOW (ref 0.7–4.0)
Neutro Abs: 3 10*3/uL (ref 1.7–7.7)

## 2011-08-05 MED ORDER — SODIUM CHLORIDE 0.9 % IJ SOLN
INTRAMUSCULAR | Status: AC
  Filled 2011-08-05: qty 10

## 2011-08-05 MED ORDER — DEXAMETHASONE SODIUM PHOSPHATE 4 MG/ML IJ SOLN: 20.0000 mg | Freq: Once | INTRAMUSCULAR | Status: DC

## 2011-08-05 MED ORDER — HEPARIN SOD (PORK) LOCK FLUSH 100 UNIT/ML IV SOLN
INTRAVENOUS | Status: AC
  Filled 2011-08-05: qty 5

## 2011-08-05 MED ORDER — SODIUM CHLORIDE 0.9 % IV SOLN
750.0000 mg | Freq: Once | INTRAVENOUS | Status: AC
  Administered 2011-08-05: 750 mg via INTRAVENOUS
  Filled 2011-08-05: qty 75

## 2011-08-05 MED ORDER — SODIUM CHLORIDE 0.9 % IV SOLN
Freq: Once | INTRAVENOUS | Status: AC
  Administered 2011-08-05: 09:00:00 via INTRAVENOUS

## 2011-08-05 MED ORDER — SODIUM CHLORIDE 0.9 % IJ SOLN
10.0000 mL | INTRAMUSCULAR | Status: DC | PRN
  Administered 2011-08-05: 10 mL
  Filled 2011-08-05: qty 10

## 2011-08-05 MED ORDER — PACLITAXEL CHEMO INJECTION 300 MG/50ML
175.0000 mg/m2 | Freq: Once | INTRAVENOUS | Status: AC
  Administered 2011-08-05: 360 mg via INTRAVENOUS
  Filled 2011-08-05: qty 60

## 2011-08-05 MED ORDER — DIPHENHYDRAMINE HCL 50 MG/ML IJ SOLN
50.0000 mg | Freq: Once | INTRAMUSCULAR | Status: AC
  Administered 2011-08-05: 50 mg via INTRAVENOUS

## 2011-08-05 MED ORDER — SODIUM CHLORIDE 0.9 % IV SOLN
Freq: Once | INTRAVENOUS | Status: AC
  Administered 2011-08-05: 16 mg via INTRAVENOUS
  Filled 2011-08-05: qty 8

## 2011-08-05 MED ORDER — SODIUM CHLORIDE 0.9 % IV SOLN: 16.0000 mg | Freq: Once | INTRAVENOUS | Status: DC

## 2011-08-05 MED ORDER — FAMOTIDINE IN NACL 20-0.9 MG/50ML-% IV SOLN
INTRAVENOUS | Status: AC
  Filled 2011-08-05: qty 50

## 2011-08-05 MED ORDER — FAMOTIDINE IN NACL 20-0.9 MG/50ML-% IV SOLN
20.0000 mg | Freq: Once | INTRAVENOUS | Status: AC
  Administered 2011-08-05: 20 mg via INTRAVENOUS

## 2011-08-05 MED ORDER — HEPARIN SOD (PORK) LOCK FLUSH 100 UNIT/ML IV SOLN
500.0000 [IU] | Freq: Once | INTRAVENOUS | Status: AC | PRN
  Administered 2011-08-05: 500 [IU]
  Filled 2011-08-05: qty 5

## 2011-08-05 MED ORDER — DIPHENHYDRAMINE HCL 50 MG/ML IJ SOLN
INTRAMUSCULAR | Status: AC
  Filled 2011-08-05: qty 1

## 2011-08-05 NOTE — Progress Notes (Signed)
Tolerated well

## 2011-08-05 NOTE — Progress Notes (Signed)
Patient informed of PT/INR results and instructed on Coumadin dosage.  Appointment for PT/INR follow up given as well.  Patient verbalized understanding.

## 2011-08-06 ENCOUNTER — Other Ambulatory Visit (HOSPITAL_COMMUNITY): Payer: BC Managed Care – PPO

## 2011-08-06 ENCOUNTER — Encounter (HOSPITAL_BASED_OUTPATIENT_CLINIC_OR_DEPARTMENT_OTHER): Payer: BC Managed Care – PPO

## 2011-08-06 VITALS — BP 132/70 | HR 68 | Temp 96.9°F | Resp 16

## 2011-08-06 DIAGNOSIS — C569 Malignant neoplasm of unspecified ovary: Secondary | ICD-10-CM

## 2011-08-06 DIAGNOSIS — Z5189 Encounter for other specified aftercare: Secondary | ICD-10-CM

## 2011-08-06 MED ORDER — PEGFILGRASTIM INJECTION 6 MG/0.6ML
SUBCUTANEOUS | Status: AC
  Filled 2011-08-06: qty 0.6

## 2011-08-06 MED ORDER — PEGFILGRASTIM INJECTION 6 MG/0.6ML
6.0000 mg | Freq: Once | SUBCUTANEOUS | Status: AC
  Administered 2011-08-06: 6 mg via SUBCUTANEOUS

## 2011-08-06 NOTE — Progress Notes (Signed)
Sarah Walker presents today for injection per MD orders. Neulasta 6mg  administered SQ in right Abdomen. Administration without incident. Patient tolerated well.

## 2011-08-12 ENCOUNTER — Encounter (HOSPITAL_COMMUNITY): Payer: BC Managed Care – PPO

## 2011-08-12 ENCOUNTER — Other Ambulatory Visit (HOSPITAL_COMMUNITY): Payer: Self-pay | Admitting: Oncology

## 2011-08-12 DIAGNOSIS — I2699 Other pulmonary embolism without acute cor pulmonale: Secondary | ICD-10-CM

## 2011-08-12 NOTE — Progress Notes (Signed)
Labs drawn today for pt 

## 2011-08-17 ENCOUNTER — Encounter (HOSPITAL_COMMUNITY): Payer: Self-pay | Admitting: Oncology

## 2011-08-17 ENCOUNTER — Encounter (HOSPITAL_COMMUNITY): Payer: BC Managed Care – PPO | Admitting: Oncology

## 2011-08-17 ENCOUNTER — Encounter (HOSPITAL_BASED_OUTPATIENT_CLINIC_OR_DEPARTMENT_OTHER): Payer: BC Managed Care – PPO | Admitting: Oncology

## 2011-08-17 VITALS — BP 128/82 | HR 75 | Temp 97.7°F | Resp 18 | Wt 205.4 lb

## 2011-08-17 DIAGNOSIS — C569 Malignant neoplasm of unspecified ovary: Secondary | ICD-10-CM

## 2011-08-17 DIAGNOSIS — G609 Hereditary and idiopathic neuropathy, unspecified: Secondary | ICD-10-CM

## 2011-08-17 DIAGNOSIS — I2699 Other pulmonary embolism without acute cor pulmonale: Secondary | ICD-10-CM

## 2011-08-17 NOTE — Patient Instructions (Addendum)
Sarah Walker  DOB 08-08-1950 CSN [id number]  MRN [id number] Dr. Glenford Peers   [facility]  Discharge Instructions  RECOMMENDATIONS MADE BY THE CONSULTANT AND ANY TEST RESULTS WILL BE SENT TO YOUR REFERRING DOCTOR.   EXAM FINDINGS BY MD TODAY AND SIGNS AND SYMPTOMS TO REPORT TO CLINIC OR PRIMARY MD: Exam and discussion by PA.  You are doing well.  MEDICATIONS PRESCRIBED: none   INSTRUCTIONS GIVEN AND DISCUSSED: Other :  Report uncontrolled nausea, vomiting, pain, shortness of breath, increased fatigue or fevers.   SPECIAL INSTRUCTIONS/FOLLOW-UP: Return to Clinic as scheduled.   I acknowledge that I have been informed and understand all the instructions given to me and received a copy. I do not have any more questions at this time, but understand that I may call the Specialty Clinic at [facility] at [phone] during business hours should I have any further questions or need assistance in obtaining follow-up care.    __________________________________________  _____________  __________ Signature of Patient or Authorized Representative            Date                   Time    __________________________________________ Nurse's Signature

## 2011-08-17 NOTE — Progress Notes (Signed)
[name], MD [address] [place] [address]  1. Ovarian cancer     CURRENT THERAPY:S/P Cycle 2 Carbo/Taxol followed by Neulasta support. Started on 07/15/2011   INTERVAL HISTORY: Sarah Walker 61 y.o. female returns for  regular  visit for followup of Stage IA Ovarian Ca. S/P total hysterectomy and now undergoing systemic chemotherapy With Carbo/Taxol.   The patient is here for follow-up to see how cycle 2 of chemotherapy went.  She is due for cycle 3 on 08/26/2011.  She tolerated cycle II very well with the decreased dose of PO Decadron.  She was pleased with her tolerance of cycle II.    She questions when she may have her IVC filter removed and I have asked her to wait until after chemotherapy.  She is agreeable to this.   I personally reviewed and went over laboratory results with the patient.  We did discuss her increased CA 125 but she understands that this may be from an increased release of this protein with the cytotoxic chemotherapy.    She denies any complaints.  Her peripheral neuropathy is stable and she is able to button buttons and it does not interfere with ADLs.  She reports that around this time of the year, she becomes ill with sputum production.  Usually she is treated by her PCP with an antibiotic injection, likley Rocephin.  She is free to call us or her PCP if she becomes ill.  We can treat accordingly.   Past Medical History  Diagnosis Date  . Allergic rhinitis   . Sinusitis   . Hypertension   . Ovarian cancer   . Pulmonary embolism     has Ovarian cancer; Hypertension; H/O hysterectomy with oophorectomy; and Pulmonary embolism on her problem list.     is allergic to codeine.  Ms. Sharpless had no medications administered during this visit.  Past Surgical History  Procedure Date  . Tonsillectomy     age 4's  . Cholecystectomy 1990  . Abdominal hysterectomy 05/25/2011    [place] bs&o and cancer staging  . Tubal ligation  1981  . Portacath placement 06/16/11    Done at [facility]  . Ivc filter 05/24/11    Denies any headaches, dizziness, double vision, fevers, chills, night sweats, nausea, vomiting, diarrhea, constipation, chest pain, heart palpitations, shortness of breath, blood in stool, black tarry stool, urinary pain, urinary burning, urinary frequency, hematuria.   PHYSICAL EXAMINATION  ECOG PERFORMANCE STATUS: 1 - Symptomatic but completely ambulatory  Filed Vitals:   08/17/11 1516  BP: 128/82  Pulse: 75  Temp: 97.7 F (36.5 C)  Resp: 18    GENERAL:alert, no distress, well nourished, well developed, comfortable, cooperative, obese, smiling and alopecia SKIN: skin color, texture, turgor are normal, no rashes or significant lesions HEAD: Normocephalic, No masses, lesions, tenderness or abnormalities EYES: normal, Conjunctiva are pink and non-injected EARS: External ears normal OROPHARYNX:lips, buccal mucosa, and tongue normal and mucous membranes are moist  NECK: supple, no adenopathy, thyroid normal size, non-tender, without nodularity, no stridor, non-tender, trachea midline LYMPH:  no palpable lymphadenopathy, no hepatosplenomegaly BREAST:not examined LUNGS: clear to auscultation and percussion HEART: regular rate & rhythm, no murmurs, no gallops, S1 normal and S2 normal ABDOMEN:abdomen soft, non-tender, obese, normal bowel sounds, no masses or organomegaly and no hepatosplenomegaly BACK: Back symmetric, no curvature. EXTREMITIES:less then 2 second capillary refill, no joint deformities, effusion, or inflammation, no edema, no skin discoloration, no clubbing, no cyanosis  NEURO: alert & oriented x 3 with fluent speech, no focal motor/sensory deficits, gait normal   LABORATORY DATA: Lab Results  Component Value Date   INR 2.42* 08/12/2011   INR 2.45* 08/05/2011   INR 2.19* 08/02/2011   CBC    Component Value Date/Time   WBC 3.6* 08/05/2011 0824   RBC 3.64* 08/05/2011 0824   HGB 10.4*  08/05/2011 0824   HCT 31.3* 08/05/2011 0824   PLT 248 08/05/2011 0824   MCV 86.0 08/05/2011 0824   MCH 28.6 08/05/2011 0824   MCHC 33.2 08/05/2011 0824   RDW 15.9* 08/05/2011 0824   LYMPHSABS 0.6* 08/05/2011 0824   MONOABS 0.0* 08/05/2011 0824   EOSABS 0.0 08/05/2011 0824   BASOSABS 0.0 08/05/2011 0824      Chemistry      Component Value Date/Time   NA 138 08/05/2011 0824   K 3.6 08/05/2011 0824   CL 103 08/05/2011 0824   CO2 25 08/05/2011 0824   BUN 21 08/05/2011 0824   CREATININE 0.65 08/05/2011 0824      Component Value Date/Time   CALCIUM 9.4 08/05/2011 0824   ALKPHOS 59 07/09/2011 0822   AST 15 07/09/2011 0822   ALT 15 07/09/2011 0822   BILITOT 0.2* 07/09/2011 0822     Lab Results  Component Value Date   CA125 193.1* 08/05/2011      ASSESSMENT:  1. Stage I a ovarian cancer status post resection with elevated CA 125 (165.1) at baseline before chemotherapy administration.  2. History of hypertension  3. Pulmonary emboli on Coumadin and we will continue that for at least 6 months possibly 12 months.  4. Grade 1 peripheral neuropathy  5. Flea bites on back, non-infected  6. Steroid induced worsened vision, resolved.   PLAN:  1. I personally reviewed and went over laboratory results with the patient. 2. She tolerated therapy this time better than her 1st cycle.  Will continue with decreased dose of decadron at home.  3. Recommend IVC filter removal wait until after chemotherapy.  4. Cycle III of chemotherapy as scheduled with pre-chemo lab work. 5. Return as scheduled following cycle III of chemtherapy for follow-up.   All questions were answered. The patient knows to call the clinic with any problems, questions or concerns. We can certainly see the patient much sooner if necessary.  Baley Shands

## 2011-08-23 ENCOUNTER — Other Ambulatory Visit (HOSPITAL_COMMUNITY): Payer: Self-pay | Admitting: Oncology

## 2011-08-23 ENCOUNTER — Encounter (HOSPITAL_BASED_OUTPATIENT_CLINIC_OR_DEPARTMENT_OTHER): Payer: BC Managed Care – PPO

## 2011-08-23 DIAGNOSIS — I2699 Other pulmonary embolism without acute cor pulmonale: Secondary | ICD-10-CM

## 2011-08-23 LAB — PROTIME-INR
INR: 1.57 — ABNORMAL HIGH (ref 0.00–1.49)
Prothrombin Time: 19.1 seconds — ABNORMAL HIGH (ref 11.6–15.2)

## 2011-08-23 NOTE — Progress Notes (Signed)
Labs drawn today for pt 

## 2011-08-23 NOTE — Progress Notes (Signed)
INR 1.57. New dose of coumadin 2.5/2.5/5. Pt notified. INR return appt made for 8/26

## 2011-08-26 ENCOUNTER — Encounter (HOSPITAL_BASED_OUTPATIENT_CLINIC_OR_DEPARTMENT_OTHER): Payer: BC Managed Care – PPO

## 2011-08-26 ENCOUNTER — Encounter (HOSPITAL_COMMUNITY): Payer: BC Managed Care – PPO

## 2011-08-26 ENCOUNTER — Encounter (HOSPITAL_COMMUNITY): Payer: Self-pay

## 2011-08-26 ENCOUNTER — Other Ambulatory Visit (HOSPITAL_COMMUNITY): Payer: Self-pay | Admitting: Oncology

## 2011-08-26 DIAGNOSIS — Z5111 Encounter for antineoplastic chemotherapy: Secondary | ICD-10-CM

## 2011-08-26 DIAGNOSIS — G609 Hereditary and idiopathic neuropathy, unspecified: Secondary | ICD-10-CM

## 2011-08-26 DIAGNOSIS — E876 Hypokalemia: Secondary | ICD-10-CM

## 2011-08-26 DIAGNOSIS — G629 Polyneuropathy, unspecified: Secondary | ICD-10-CM

## 2011-08-26 DIAGNOSIS — R42 Dizziness and giddiness: Secondary | ICD-10-CM

## 2011-08-26 DIAGNOSIS — C569 Malignant neoplasm of unspecified ovary: Secondary | ICD-10-CM

## 2011-08-26 HISTORY — DX: Polyneuropathy, unspecified: G62.9

## 2011-08-26 LAB — CBC WITH DIFFERENTIAL/PLATELET
Basophils Absolute: 0 10*3/uL (ref 0.0–0.1)
Basophils Relative: 0 % (ref 0–1)
Hemoglobin: 9.3 g/dL — ABNORMAL LOW (ref 12.0–15.0)
MCHC: 34.1 g/dL (ref 30.0–36.0)
Monocytes Relative: 1 % — ABNORMAL LOW (ref 3–12)
Neutro Abs: 3.2 10*3/uL (ref 1.7–7.7)
Neutrophils Relative %: 84 % — ABNORMAL HIGH (ref 43–77)
RDW: 17.4 % — ABNORMAL HIGH (ref 11.5–15.5)

## 2011-08-26 LAB — COMPREHENSIVE METABOLIC PANEL
AST: 13 U/L (ref 0–37)
BUN: 21 mg/dL (ref 6–23)
CO2: 26 mEq/L (ref 19–32)
Calcium: 9.7 mg/dL (ref 8.4–10.5)
Chloride: 102 mEq/L (ref 96–112)
Creatinine, Ser: 0.93 mg/dL (ref 0.50–1.10)
GFR calc Af Amer: 75 mL/min — ABNORMAL LOW (ref >90)
GFR calc non Af Amer: 65 mL/min — ABNORMAL LOW (ref >90)
Glucose, Bld: 159 mg/dL — ABNORMAL HIGH (ref 70–99)
Total Bilirubin: 0.2 mg/dL — ABNORMAL LOW (ref 0.3–1.2)

## 2011-08-26 MED ORDER — SODIUM CHLORIDE 0.9 % IV SOLN: 16.0000 mg | Freq: Once | INTRAVENOUS | Status: DC

## 2011-08-26 MED ORDER — HEPARIN SOD (PORK) LOCK FLUSH 100 UNIT/ML IV SOLN
500.0000 [IU] | Freq: Once | INTRAVENOUS | Status: DC | PRN
  Filled 2011-08-26: qty 5

## 2011-08-26 MED ORDER — SODIUM CHLORIDE 0.9 % IV SOLN
750.0000 mg | Freq: Once | INTRAVENOUS | Status: AC
  Administered 2011-08-26: 750 mg via INTRAVENOUS
  Filled 2011-08-26: qty 75

## 2011-08-26 MED ORDER — DIPHENHYDRAMINE HCL 50 MG/ML IJ SOLN
INTRAMUSCULAR | Status: AC
  Filled 2011-08-26: qty 1

## 2011-08-26 MED ORDER — DEXTROSE 5 % IV SOLN
175.0000 mg/m2 | Freq: Once | INTRAVENOUS | Status: AC
  Administered 2011-08-26: 360 mg via INTRAVENOUS
  Filled 2011-08-26: qty 60

## 2011-08-26 MED ORDER — SODIUM CHLORIDE 0.9 % IV SOLN
Freq: Once | INTRAVENOUS | Status: AC
  Administered 2011-08-26: 10:00:00 via INTRAVENOUS

## 2011-08-26 MED ORDER — DIPHENHYDRAMINE HCL 50 MG/ML IJ SOLN
50.0000 mg | Freq: Once | INTRAMUSCULAR | Status: AC
  Administered 2011-08-26: 50 mg via INTRAVENOUS

## 2011-08-26 MED ORDER — HEPARIN SOD (PORK) LOCK FLUSH 100 UNIT/ML IV SOLN
INTRAVENOUS | Status: AC
  Filled 2011-08-26: qty 5

## 2011-08-26 MED ORDER — DEXAMETHASONE SODIUM PHOSPHATE 10 MG/ML IJ SOLN: 20.0000 mg | Freq: Once | INTRAMUSCULAR | Status: DC

## 2011-08-26 MED ORDER — POTASSIUM CHLORIDE ER 10 MEQ PO TBCR: EXTENDED_RELEASE_TABLET | ORAL | Status: DC

## 2011-08-26 MED ORDER — GABAPENTIN 300 MG PO CAPS: ORAL_CAPSULE | ORAL | Status: DC

## 2011-08-26 MED ORDER — FAMOTIDINE IN NACL 20-0.9 MG/50ML-% IV SOLN
20.0000 mg | Freq: Once | INTRAVENOUS | Status: AC
  Administered 2011-08-26: 20 mg via INTRAVENOUS

## 2011-08-26 MED ORDER — SODIUM CHLORIDE 0.9 % IV SOLN
Freq: Once | INTRAVENOUS | Status: AC
  Administered 2011-08-26: 16 mg via INTRAVENOUS
  Filled 2011-08-26: qty 8

## 2011-08-26 MED ORDER — FAMOTIDINE IN NACL 20-0.9 MG/50ML-% IV SOLN
INTRAVENOUS | Status: AC
  Filled 2011-08-26: qty 50

## 2011-08-26 NOTE — Progress Notes (Signed)
Sarah Walker reports that her allergies become an issue for her this time of year.  She has not been taking her usual allegra.  She reports some inner ear issues including dizziness.  I encouraged her to take her allegra daily and she should consider benadryl at HS as well.  Physical exam was unimpressive with a pearly white tympanic membrane that was not bulging or retracted.  No external canal pain or erythema.  Minimal nonocclusive cerumen appreciated.   She also notes some grade 1-2 peripheral neuropathy.  She explains that it is only in her fingertips most of the time, but occasionally she reports that her fingers are involved.  More importantly, her tips of her toes are affected and this migrates to the balls of her feet.  She reports that "I feel like I am walking on cotton."  So we will start Gabapentin and titrate up to a dose of 1200 mg at HS. She was informed to take this at [facility] because it can cause drowsiness.  This medication was e-scribed.  I encouraged her to continue to inform us of her peripheral neuropathy and this should be a topic of conversation at each encounter.   KEFALAS,THOMAS

## 2011-08-27 ENCOUNTER — Encounter (HOSPITAL_BASED_OUTPATIENT_CLINIC_OR_DEPARTMENT_OTHER): Payer: BC Managed Care – PPO

## 2011-08-27 DIAGNOSIS — C569 Malignant neoplasm of unspecified ovary: Secondary | ICD-10-CM

## 2011-08-27 DIAGNOSIS — Z5189 Encounter for other specified aftercare: Secondary | ICD-10-CM

## 2011-08-27 MED ORDER — PEGFILGRASTIM INJECTION 6 MG/0.6ML
6.0000 mg | Freq: Once | SUBCUTANEOUS | Status: AC
  Administered 2011-08-27: 6 mg via SUBCUTANEOUS

## 2011-08-27 MED ORDER — PEGFILGRASTIM INJECTION 6 MG/0.6ML
SUBCUTANEOUS | Status: AC
  Filled 2011-08-27: qty 0.6

## 2011-08-27 NOTE — Progress Notes (Signed)
Sarah Walker presents today for injection per MD orders. Neulasta 6mg  administered SQ in left Abdomen. Administration without incident. Patient tolerated well.

## 2011-08-30 ENCOUNTER — Ambulatory Visit (HOSPITAL_COMMUNITY): Payer: BC Managed Care – PPO

## 2011-08-30 ENCOUNTER — Encounter (HOSPITAL_BASED_OUTPATIENT_CLINIC_OR_DEPARTMENT_OTHER): Payer: BC Managed Care – PPO

## 2011-08-30 ENCOUNTER — Other Ambulatory Visit (HOSPITAL_COMMUNITY): Payer: BC Managed Care – PPO

## 2011-08-30 ENCOUNTER — Other Ambulatory Visit (HOSPITAL_COMMUNITY): Payer: Self-pay | Admitting: Oncology

## 2011-08-30 DIAGNOSIS — I2699 Other pulmonary embolism without acute cor pulmonale: Secondary | ICD-10-CM

## 2011-08-30 LAB — PROTIME-INR: Prothrombin Time: 36.7 seconds — ABNORMAL HIGH (ref 11.6–15.2)

## 2011-08-30 NOTE — Progress Notes (Signed)
Labs drawn today for pt 

## 2011-08-30 NOTE — Progress Notes (Signed)
Patient  instructed to hold coumadin tonight then take 2.5 mg daily and to return for next PT/INR on 09/07/11.  Verbalizes understanding.

## 2011-08-31 ENCOUNTER — Other Ambulatory Visit (HOSPITAL_COMMUNITY): Payer: BC Managed Care – PPO

## 2011-09-07 ENCOUNTER — Encounter (HOSPITAL_BASED_OUTPATIENT_CLINIC_OR_DEPARTMENT_OTHER): Payer: BC Managed Care – PPO

## 2011-09-07 ENCOUNTER — Other Ambulatory Visit (HOSPITAL_COMMUNITY): Payer: BC Managed Care – PPO

## 2011-09-07 ENCOUNTER — Ambulatory Visit (HOSPITAL_COMMUNITY): Payer: BC Managed Care – PPO | Admitting: Oncology

## 2011-09-07 ENCOUNTER — Encounter (HOSPITAL_COMMUNITY): Payer: BC Managed Care – PPO | Attending: Oncology | Admitting: Oncology

## 2011-09-07 VITALS — BP 121/84 | HR 84 | Temp 98.1°F | Resp 18 | Wt 205.6 lb

## 2011-09-07 DIAGNOSIS — I2699 Other pulmonary embolism without acute cor pulmonale: Secondary | ICD-10-CM

## 2011-09-07 DIAGNOSIS — C569 Malignant neoplasm of unspecified ovary: Secondary | ICD-10-CM

## 2011-09-07 DIAGNOSIS — G609 Hereditary and idiopathic neuropathy, unspecified: Secondary | ICD-10-CM

## 2011-09-07 LAB — PROTIME-INR: INR: 1.52 — ABNORMAL HIGH (ref 0.00–1.49)

## 2011-09-07 NOTE — Patient Instructions (Addendum)
Sarah Walker  DOB 02/17/1950 CSN [id number]  MRN [id number] Dr. Glenford Peers   [facility]  Discharge Instructions  RECOMMENDATIONS MADE BY THE CONSULTANT AND ANY TEST RESULTS WILL BE SENT TO YOUR REFERRING DOCTOR.   EXAM FINDINGS BY MD TODAY AND SIGNS AND SYMPTOMS TO REPORT TO CLINIC OR PRIMARY MD: you are doing well.  We will change your injection from neulasta to neupogen for 7 days beginning with your next cycle.  Report fevers, chills, uncontrolled nausea or vomiting, etc.  MEDICATIONS PRESCRIBED: non3   INSTRUCTIONS GIVEN AND DISCUSSED: Other :  Continue coumadin same dosage and come back on Friday for your next PT level.  SPECIAL INSTRUCTIONS/FOLLOW-UP: Lab work Needed Friday and Return to Clinic as scheduled.   I acknowledge that I have been informed and understand all the instructions given to me and received a copy. I do not have any more questions at this time, but understand that I may call the Specialty Clinic at [facility] at [phone] during business hours should I have any further questions or need assistance in obtaining follow-up care.    __________________________________________  _____________  __________ Signature of Patient or Authorized Representative            Date                   Time    __________________________________________ Nurse's Signature

## 2011-09-07 NOTE — Progress Notes (Deleted)
Labs drawn today for cbc/diff,cmp,cea 

## 2011-09-07 NOTE — Progress Notes (Signed)
Problem #1 ovarian cancer stage IA though she had a very elevated CA 125 level postoperatively which actually rose prior to the start of chemotherapy. She is now status post 3 cycles of carboplatin and Taxol. Problem #2 grade 1 peripheral neuropathy Problem #3 pulmonary emboli on Coumadin. We had to hold her dose last week and reduced her dosage. Her INR is not therapeutic but we will stay with the reduced dose and check it again Friday.  She has significant pain after the Neulasta shot. We will change her to Neupogen 480 mcg daily for 7 days starting this Saturday morning after her chemotherapy. She states that Motrin and Aleve have not helped her. She took 2 Aleve twice a day without any improvement.  She otherwise is doing well no breathing get difficulties. She is not aware of any bowel problems.  Vital signs are very stable she is in no acute distress nail color is intact vital signs are without change. She has no lymphadenopathy in the cervical, supraclavicular, infraclavicular, axillary, or inguinal areas. She has no leg edema. Skin color is normal though she is slightly pale. Lungs are clear to auscultation and percussion. Heart shows a regular rhythm and rate without murmur rub or gallop. Port-A-Cath is intact. Abdomen is obese but without obvious ascites or hepatosplenomegaly. She has no tenderness. Incisions well-healed.  We will continue therapy check her cancer marker Which was not done with cycle 3. We'll see what Neupogen does for her this time and see if it is tolerated better. She's also on the gabapentin and we'll start 1200 mg tonight.  We will adjust her Coumadin based upon her INR this Friday.

## 2011-09-07 NOTE — Progress Notes (Signed)
Labs drawn today for pt 

## 2011-09-10 ENCOUNTER — Encounter (HOSPITAL_BASED_OUTPATIENT_CLINIC_OR_DEPARTMENT_OTHER): Payer: BC Managed Care – PPO

## 2011-09-10 ENCOUNTER — Other Ambulatory Visit (HOSPITAL_COMMUNITY): Payer: Self-pay | Admitting: Oncology

## 2011-09-10 ENCOUNTER — Telehealth (HOSPITAL_COMMUNITY): Payer: Self-pay | Admitting: Oncology

## 2011-09-10 DIAGNOSIS — C569 Malignant neoplasm of unspecified ovary: Secondary | ICD-10-CM

## 2011-09-10 DIAGNOSIS — I2699 Other pulmonary embolism without acute cor pulmonale: Secondary | ICD-10-CM

## 2011-09-10 LAB — PROTIME-INR: INR: 1.48 (ref 0.00–1.49)

## 2011-09-10 NOTE — Progress Notes (Signed)
Labs drawn today for pt 

## 2011-09-10 NOTE — Telephone Encounter (Signed)
[employer] OOS [phone] ? IF NEUPOGEN ([id number]) REQUIRES AUTH AND WHAT THE BENEFITS ARE FOR OUT PT/PHARMACY. CSR-AMY FOR OT PT PLAN PAYS 20 % AFTER DED AND 100% AFTER OOP IS MET. PT HAS MET BOTH DED/OOP FOR THE CALENDER YR. FOR OT PT RX THE PT MUST PAY 25% OF THE DRUG COST AND AFTER THE 3RD REFILL IT MUST BE PRESCRIBED TO A MAIL ORDER RX FOR A 90 DAY SUPPLY AFTER WHICH THE PT WILL PAY 20% OF THE DRUG COST. I WILL CALL PT AND LEAVE A VM RQSTING SHE CALL ME BACK FOR THE DETAILS LISTED ABOVE.  April Manson Database administrator Medical Oncology [phone]

## 2011-09-10 NOTE — Addendum Note (Signed)
Addended by: Evelena Leyden on: 09/10/2011 05:28 PM   Modules accepted: Orders

## 2011-09-14 ENCOUNTER — Other Ambulatory Visit (HOSPITAL_COMMUNITY): Payer: BC Managed Care – PPO

## 2011-09-15 ENCOUNTER — Telehealth (HOSPITAL_COMMUNITY): Payer: Self-pay | Admitting: Oncology

## 2011-09-15 NOTE — Telephone Encounter (Signed)
RCVD A CALL FROM FELISA -CSR @ [facility] ASKING IF WE HAVE RCVD THE PRE AUTH FORM THEY FAXED TO [place]. I ADVSD HER THAT THE PT WILL BE GIVEN THE DRUG HERE AND NOT AT HOME. PER  FELISA [phone] NO PRE AUTH IS NEEDED. PT [phone].  April Manson Database administrator Medical Oncology [phone]

## 2011-09-16 ENCOUNTER — Encounter (HOSPITAL_COMMUNITY): Payer: Self-pay | Admitting: Oncology

## 2011-09-16 ENCOUNTER — Other Ambulatory Visit (HOSPITAL_COMMUNITY): Payer: BC Managed Care – PPO

## 2011-09-16 ENCOUNTER — Encounter (HOSPITAL_BASED_OUTPATIENT_CLINIC_OR_DEPARTMENT_OTHER): Payer: BC Managed Care – PPO

## 2011-09-16 ENCOUNTER — Other Ambulatory Visit (HOSPITAL_COMMUNITY): Payer: Self-pay | Admitting: Oncology

## 2011-09-16 VITALS — BP 113/75 | HR 85 | Temp 97.9°F | Resp 16 | Wt 205.4 lb

## 2011-09-16 DIAGNOSIS — Z5111 Encounter for antineoplastic chemotherapy: Secondary | ICD-10-CM

## 2011-09-16 DIAGNOSIS — I2699 Other pulmonary embolism without acute cor pulmonale: Secondary | ICD-10-CM

## 2011-09-16 DIAGNOSIS — C569 Malignant neoplasm of unspecified ovary: Secondary | ICD-10-CM

## 2011-09-16 DIAGNOSIS — D6481 Anemia due to antineoplastic chemotherapy: Secondary | ICD-10-CM

## 2011-09-16 DIAGNOSIS — D649 Anemia, unspecified: Secondary | ICD-10-CM

## 2011-09-16 DIAGNOSIS — T451X5A Adverse effect of antineoplastic and immunosuppressive drugs, initial encounter: Secondary | ICD-10-CM

## 2011-09-16 HISTORY — DX: Adverse effect of antineoplastic and immunosuppressive drugs, initial encounter: D64.81

## 2011-09-16 LAB — CBC WITH DIFFERENTIAL/PLATELET
Basophils Absolute: 0 10*3/uL (ref 0.0–0.1)
Basophils Relative: 0 % (ref 0–1)
Eosinophils Absolute: 0 10*3/uL (ref 0.0–0.7)
Eosinophils Relative: 0 % (ref 0–5)
HCT: 22.8 % — ABNORMAL LOW (ref 36.0–46.0)
Hemoglobin: 7.8 g/dL — ABNORMAL LOW (ref 12.0–15.0)
MCH: 30.7 pg (ref 26.0–34.0)
MCHC: 34.2 g/dL (ref 30.0–36.0)
MCV: 89.8 fL (ref 78.0–100.0)
Monocytes Absolute: 0 10*3/uL — ABNORMAL LOW (ref 0.1–1.0)
Monocytes Relative: 0 % — ABNORMAL LOW (ref 3–12)
RDW: 22 % — ABNORMAL HIGH (ref 11.5–15.5)

## 2011-09-16 LAB — COMPREHENSIVE METABOLIC PANEL
AST: 12 U/L (ref 0–37)
Albumin: 3.4 g/dL — ABNORMAL LOW (ref 3.5–5.2)
BUN: 22 mg/dL (ref 6–23)
Calcium: 9.3 mg/dL (ref 8.4–10.5)
Chloride: 102 mEq/L (ref 96–112)
Creatinine, Ser: 0.66 mg/dL (ref 0.50–1.10)
Total Bilirubin: 0.2 mg/dL — ABNORMAL LOW (ref 0.3–1.2)

## 2011-09-16 LAB — PROTIME-INR
INR: 2.07 — ABNORMAL HIGH (ref 0.00–1.49)
Prothrombin Time: 23.7 seconds — ABNORMAL HIGH (ref 11.6–15.2)

## 2011-09-16 MED ORDER — SODIUM CHLORIDE 0.9 % IJ SOLN
10.0000 mL | INTRAMUSCULAR | Status: DC | PRN
  Administered 2011-09-16: 10 mL
  Filled 2011-09-16: qty 10

## 2011-09-16 MED ORDER — EPOETIN ALFA 40000 UNIT/ML IJ SOLN
INTRAMUSCULAR | Status: AC
  Filled 2011-09-16: qty 1

## 2011-09-16 MED ORDER — HEPARIN SOD (PORK) LOCK FLUSH 100 UNIT/ML IV SOLN
500.0000 [IU] | Freq: Once | INTRAVENOUS | Status: AC | PRN
  Administered 2011-09-16: 500 [IU]
  Filled 2011-09-16: qty 5

## 2011-09-16 MED ORDER — DIPHENHYDRAMINE HCL 50 MG/ML IJ SOLN
50.0000 mg | Freq: Once | INTRAMUSCULAR | Status: AC
  Administered 2011-09-16: 50 mg via INTRAVENOUS

## 2011-09-16 MED ORDER — EPOETIN ALFA 20000 UNIT/ML IJ SOLN
INTRAMUSCULAR | Status: AC
  Filled 2011-09-16: qty 1

## 2011-09-16 MED ORDER — EPOETIN ALFA 20000 UNIT/ML IJ SOLN
60000.0000 [IU] | Freq: Once | INTRAMUSCULAR | Status: AC
  Administered 2011-09-16: 60000 [IU] via SUBCUTANEOUS

## 2011-09-16 MED ORDER — DIPHENHYDRAMINE HCL 50 MG/ML IJ SOLN
INTRAMUSCULAR | Status: AC
  Filled 2011-09-16: qty 1

## 2011-09-16 MED ORDER — SODIUM CHLORIDE 0.9 % IV SOLN
Freq: Once | INTRAVENOUS | Status: AC
  Administered 2011-09-16: 16 mg via INTRAVENOUS
  Filled 2011-09-16: qty 8

## 2011-09-16 MED ORDER — SODIUM CHLORIDE 0.9 % IJ SOLN
INTRAMUSCULAR | Status: AC
  Filled 2011-09-16: qty 10

## 2011-09-16 MED ORDER — DEXAMETHASONE SODIUM PHOSPHATE 10 MG/ML IJ SOLN: 20.0000 mg | Freq: Once | INTRAMUSCULAR | Status: DC

## 2011-09-16 MED ORDER — SODIUM CHLORIDE 0.9 % IV SOLN
750.0000 mg | Freq: Once | INTRAVENOUS | Status: AC
  Administered 2011-09-16: 750 mg via INTRAVENOUS
  Filled 2011-09-16: qty 75

## 2011-09-16 MED ORDER — HEPARIN SOD (PORK) LOCK FLUSH 100 UNIT/ML IV SOLN
INTRAVENOUS | Status: AC
  Filled 2011-09-16: qty 5

## 2011-09-16 MED ORDER — FAMOTIDINE IN NACL 20-0.9 MG/50ML-% IV SOLN
20.0000 mg | Freq: Once | INTRAVENOUS | Status: AC
  Administered 2011-09-16: 20 mg via INTRAVENOUS

## 2011-09-16 MED ORDER — FAMOTIDINE IN NACL 20-0.9 MG/50ML-% IV SOLN
INTRAVENOUS | Status: AC
  Filled 2011-09-16: qty 50

## 2011-09-16 MED ORDER — SODIUM CHLORIDE 0.9 % IV SOLN
Freq: Once | INTRAVENOUS | Status: AC
  Administered 2011-09-16: 09:00:00 via INTRAVENOUS

## 2011-09-16 MED ORDER — PACLITAXEL CHEMO INJECTION 300 MG/50ML
175.0000 mg/m2 | Freq: Once | INTRAVENOUS | Status: AC
  Administered 2011-09-16: 360 mg via INTRAVENOUS
  Filled 2011-09-16: qty 60

## 2011-09-16 MED ORDER — SODIUM CHLORIDE 0.9 % IV SOLN: 16.0000 mg | Freq: Once | INTRAVENOUS | Status: DC

## 2011-09-16 NOTE — Progress Notes (Signed)
Hemoglobin discussed with T. Kefalas PA and he will discuss aranesp/procrit with pt.   Sarah Walker presents today for injection per MD orders. Procrit [id number] administered SQ in divided doses right Abdomen. Administration without incident. Patient tolerated  injection and chemo well.

## 2011-09-16 NOTE — Progress Notes (Deleted)
Patient is here to get chemotherapy.  Labs reviewed and decision was to push forward with therapy.  I personally reviewed and went over laboratory results with the patient. Her Hgb is low at 7.8 g/dL.  She reports that she is asymptomatic.  We discussed a blood transfusion but she declined.  We spoke about Procrit injections.  We discussed the risks, benefits, alternatives, and adverse reactions per FDA guidelines.  Consent was attained.  Supportive therapy plan built to consist of 60,000 units Procrit weekly.  CBC the Wednesday before Procrit since she will be here for a neupogen injection.   Sarah Walker

## 2011-09-16 NOTE — Progress Notes (Signed)
Patient is here to get chemotherapy.  Labs reviewed and decision was to push forward with therapy.  I personally reviewed and went over laboratory results with the patient. Her Hgb is low at 7.8 g/dL.  She reports that she is asymptomatic.  We discussed a blood transfusion but she declined.  We spoke about Procrit injections.  We discussed the risks, benefits, alternatives, and adverse reactions per FDA guidelines.  Consent was attained.  Supportive therapy plan built to consist of 60,000 units Procrit weekly.  CBC the Wednesday before Procrit since she will be here for a neupogen injection.   Knight Oelkers  

## 2011-09-17 ENCOUNTER — Ambulatory Visit (HOSPITAL_COMMUNITY): Payer: BC Managed Care – PPO

## 2011-09-17 LAB — CA 125: CA 125: 77.4 U/mL — ABNORMAL HIGH (ref 0.0–30.2)

## 2011-09-18 ENCOUNTER — Encounter (HOSPITAL_BASED_OUTPATIENT_CLINIC_OR_DEPARTMENT_OTHER): Payer: BC Managed Care – PPO

## 2011-09-18 DIAGNOSIS — Z5111 Encounter for antineoplastic chemotherapy: Secondary | ICD-10-CM

## 2011-09-18 DIAGNOSIS — C569 Malignant neoplasm of unspecified ovary: Secondary | ICD-10-CM

## 2011-09-18 MED ORDER — FILGRASTIM 480 MCG/0.8ML IJ SOLN
480.0000 ug | Freq: Once | INTRAMUSCULAR | Status: AC
  Administered 2011-09-18: 480 ug via SUBCUTANEOUS

## 2011-09-18 NOTE — Progress Notes (Signed)
Sarah Walker presents today for injection per the provider's orders.  Neupogen administered administration without incident; see MAR for injection details.  Patient tolerated procedure well and without incident.  No questions or complaints noted at this time; patient did convey that she is feeling better with less shortness of breath and fatigue.

## 2011-09-19 ENCOUNTER — Encounter (HOSPITAL_BASED_OUTPATIENT_CLINIC_OR_DEPARTMENT_OTHER): Payer: BC Managed Care – PPO

## 2011-09-19 DIAGNOSIS — C569 Malignant neoplasm of unspecified ovary: Secondary | ICD-10-CM

## 2011-09-19 DIAGNOSIS — Z5111 Encounter for antineoplastic chemotherapy: Secondary | ICD-10-CM

## 2011-09-19 MED ORDER — FILGRASTIM 480 MCG/0.8ML IJ SOLN
480.0000 ug | Freq: Once | INTRAMUSCULAR | Status: AC
  Administered 2011-09-19: 480 ug via SUBCUTANEOUS

## 2011-09-19 NOTE — Progress Notes (Signed)
Sarah Walker presents today for injection per the provider's orders.  Neupogen administered administration without incident; see MAR for injection details.  Patient tolerated procedure well and without incident.  No questions or complaints noted at this time.

## 2011-09-20 ENCOUNTER — Encounter (HOSPITAL_BASED_OUTPATIENT_CLINIC_OR_DEPARTMENT_OTHER): Payer: BC Managed Care – PPO

## 2011-09-20 VITALS — BP 107/81 | HR 87 | Temp 97.3°F | Resp 18

## 2011-09-20 DIAGNOSIS — Z5189 Encounter for other specified aftercare: Secondary | ICD-10-CM

## 2011-09-20 DIAGNOSIS — C569 Malignant neoplasm of unspecified ovary: Secondary | ICD-10-CM

## 2011-09-20 MED ORDER — FILGRASTIM 480 MCG/1.6ML IJ SOLN
480.0000 ug | Freq: Once | INTRAMUSCULAR | Status: AC
  Administered 2011-09-20: 480 ug via SUBCUTANEOUS
  Filled 2011-09-20: qty 1.6

## 2011-09-20 MED FILL — Filgrastim Inj 480 MCG/1.6ML (300 MCG/ML): INTRAMUSCULAR | Qty: 1.6 | Status: AC

## 2011-09-20 NOTE — Progress Notes (Signed)
Sarah Walker presents today for injection per MD orders. Neupogen 480 mcg administered SQ in left Abdomen. Administration without incident. Patient tolerated well.  

## 2011-09-21 ENCOUNTER — Encounter (HOSPITAL_BASED_OUTPATIENT_CLINIC_OR_DEPARTMENT_OTHER): Payer: BC Managed Care – PPO

## 2011-09-21 VITALS — BP 104/78 | HR 77

## 2011-09-21 DIAGNOSIS — Z5189 Encounter for other specified aftercare: Secondary | ICD-10-CM

## 2011-09-21 DIAGNOSIS — C569 Malignant neoplasm of unspecified ovary: Secondary | ICD-10-CM

## 2011-09-21 MED ORDER — FILGRASTIM 480 MCG/1.6ML IJ SOLN
480.0000 ug | Freq: Once | INTRAMUSCULAR | Status: AC
  Administered 2011-09-21: 480 ug via SUBCUTANEOUS
  Filled 2011-09-21: qty 1.6

## 2011-09-21 NOTE — Progress Notes (Signed)
Tolerated injection to right lower abd well.

## 2011-09-22 ENCOUNTER — Other Ambulatory Visit (HOSPITAL_COMMUNITY): Payer: Self-pay | Admitting: Oncology

## 2011-09-22 ENCOUNTER — Encounter (HOSPITAL_COMMUNITY): Payer: BC Managed Care – PPO

## 2011-09-22 ENCOUNTER — Encounter (HOSPITAL_BASED_OUTPATIENT_CLINIC_OR_DEPARTMENT_OTHER): Payer: BC Managed Care – PPO

## 2011-09-22 VITALS — BP 97/68 | HR 75 | Temp 97.9°F | Resp 16

## 2011-09-22 DIAGNOSIS — I2699 Other pulmonary embolism without acute cor pulmonale: Secondary | ICD-10-CM

## 2011-09-22 DIAGNOSIS — C569 Malignant neoplasm of unspecified ovary: Secondary | ICD-10-CM

## 2011-09-22 DIAGNOSIS — Z5189 Encounter for other specified aftercare: Secondary | ICD-10-CM

## 2011-09-22 LAB — CBC
HCT: 23.8 % — ABNORMAL LOW (ref 36.0–46.0)
MCV: 93.3 fL (ref 78.0–100.0)
Platelets: 287 10*3/uL (ref 150–400)
RBC: 2.55 MIL/uL — ABNORMAL LOW (ref 3.87–5.11)
WBC: 6.3 10*3/uL (ref 4.0–10.5)

## 2011-09-22 LAB — PROTIME-INR
INR: 2.54 — ABNORMAL HIGH (ref 0.00–1.49)
Prothrombin Time: 26.1 seconds — ABNORMAL HIGH (ref 11.6–15.2)

## 2011-09-22 MED ORDER — FILGRASTIM 480 MCG/1.6ML IJ SOLN
480.0000 ug | Freq: Once | INTRAMUSCULAR | Status: AC
  Administered 2011-09-22: 480 ug via SUBCUTANEOUS
  Filled 2011-09-22: qty 1.6

## 2011-09-22 NOTE — Progress Notes (Signed)
Sarah Walker presented for labwork. Labs per MD order drawn via Peripheral Line 23 gauge needle inserted in left antecubital.  Good blood return present. Procedure without incident.  Needle removed intact. Patient tolerated procedure well.  Sarah Walker presents today for injection per MD orders. Neupogen 480 mcg administered SQ in right Abdomen. Administration without incident. Patient tolerated well.  Current Coumadin dose 2.5 mg daily, denies any missed doses. Pt reports tolerating Neupogen much better than Neulasta.  Spoke with pt instruct to continue same dose Coumadin 2.5 mg daily. Verbalized understanding. Pt/inr 09/30/11.

## 2011-09-23 ENCOUNTER — Encounter (HOSPITAL_BASED_OUTPATIENT_CLINIC_OR_DEPARTMENT_OTHER): Payer: BC Managed Care – PPO

## 2011-09-23 VITALS — BP 109/75 | HR 79

## 2011-09-23 DIAGNOSIS — D649 Anemia, unspecified: Secondary | ICD-10-CM

## 2011-09-23 DIAGNOSIS — C569 Malignant neoplasm of unspecified ovary: Secondary | ICD-10-CM

## 2011-09-23 DIAGNOSIS — Z5189 Encounter for other specified aftercare: Secondary | ICD-10-CM

## 2011-09-23 MED ORDER — EPOETIN ALFA 20000 UNIT/ML IJ SOLN
60000.0000 [IU] | Freq: Once | INTRAMUSCULAR | Status: AC
  Administered 2011-09-23: 60000 [IU] via SUBCUTANEOUS

## 2011-09-23 MED ORDER — EPOETIN ALFA 20000 UNIT/ML IJ SOLN
INTRAMUSCULAR | Status: AC
  Filled 2011-09-23: qty 1

## 2011-09-23 MED ORDER — EPOETIN ALFA 40000 UNIT/ML IJ SOLN
INTRAMUSCULAR | Status: AC
  Filled 2011-09-23: qty 1

## 2011-09-23 MED ORDER — FILGRASTIM 480 MCG/1.6ML IJ SOLN
480.0000 ug | Freq: Once | INTRAMUSCULAR | Status: AC
  Administered 2011-09-23: 480 ug via SUBCUTANEOUS
  Filled 2011-09-23: qty 1.6

## 2011-09-23 NOTE — Progress Notes (Signed)
Sarah Walker presents today for injection per MD orders. Procrit 60,000 units administered SQ in left Abdomen in divided doses.  Neupogen 480mg  given SQ in right abdomen.   Administration without incident. Patient tolerated well.

## 2011-09-24 ENCOUNTER — Encounter (HOSPITAL_BASED_OUTPATIENT_CLINIC_OR_DEPARTMENT_OTHER): Payer: BC Managed Care – PPO

## 2011-09-24 DIAGNOSIS — C569 Malignant neoplasm of unspecified ovary: Secondary | ICD-10-CM

## 2011-09-24 DIAGNOSIS — Z5189 Encounter for other specified aftercare: Secondary | ICD-10-CM

## 2011-09-24 MED ORDER — FILGRASTIM 480 MCG/1.6ML IJ SOLN
480.0000 ug | Freq: Once | INTRAMUSCULAR | Status: AC
  Administered 2011-09-24: 480 ug via SUBCUTANEOUS
  Filled 2011-09-24: qty 1.6

## 2011-09-24 NOTE — Progress Notes (Signed)
Sarah Walker presents today for injection per MD orders. Neupogen 480 mcg administered SQ in left Abdomen. Administration without incident. Patient tolerated well.  

## 2011-09-25 ENCOUNTER — Encounter (HOSPITAL_COMMUNITY): Payer: BC Managed Care – PPO

## 2011-09-30 ENCOUNTER — Encounter (HOSPITAL_COMMUNITY): Payer: BC Managed Care – PPO

## 2011-09-30 ENCOUNTER — Encounter (HOSPITAL_BASED_OUTPATIENT_CLINIC_OR_DEPARTMENT_OTHER): Payer: BC Managed Care – PPO

## 2011-09-30 ENCOUNTER — Other Ambulatory Visit (HOSPITAL_COMMUNITY): Payer: Self-pay | Admitting: Oncology

## 2011-09-30 DIAGNOSIS — I2699 Other pulmonary embolism without acute cor pulmonale: Secondary | ICD-10-CM

## 2011-09-30 DIAGNOSIS — C569 Malignant neoplasm of unspecified ovary: Secondary | ICD-10-CM

## 2011-09-30 DIAGNOSIS — Z5189 Encounter for other specified aftercare: Secondary | ICD-10-CM

## 2011-09-30 LAB — CBC WITH DIFFERENTIAL/PLATELET
Basophils Relative: 0 % (ref 0–1)
HCT: 21.8 % — ABNORMAL LOW (ref 36.0–46.0)
Hemoglobin: 7.2 g/dL — ABNORMAL LOW (ref 12.0–15.0)
MCHC: 33 g/dL (ref 30.0–36.0)
Monocytes Absolute: 0.3 10*3/uL (ref 0.1–1.0)
Monocytes Relative: 9 % (ref 3–12)
Neutro Abs: 1.1 10*3/uL — ABNORMAL LOW (ref 1.7–7.7)

## 2011-09-30 LAB — PREPARE RBC (CROSSMATCH)

## 2011-09-30 LAB — ABO/RH: ABO/RH(D): A POS

## 2011-09-30 MED ORDER — EPOETIN ALFA 40000 UNIT/ML IJ SOLN
INTRAMUSCULAR | Status: AC
  Filled 2011-09-30: qty 2

## 2011-09-30 MED ORDER — EPOETIN ALFA 40000 UNIT/ML IJ SOLN
80000.0000 [IU] | Freq: Once | INTRAMUSCULAR | Status: AC
  Administered 2011-09-30: 80000 [IU] via SUBCUTANEOUS

## 2011-09-30 NOTE — Progress Notes (Signed)
Sarah Walker presents today for injection per MD orders. Procrit [id number] units administered SQ in left and right Abdomen in divided doses. Administration without incident. Patient tolerated well. Sarah Walker presented for labwork. Labs per MD order drawn via Peripheral Line 25 gauge needle inserted in left forearm.  Procedure without incident.  Patient tolerated procedure well.

## 2011-10-01 ENCOUNTER — Encounter (HOSPITAL_BASED_OUTPATIENT_CLINIC_OR_DEPARTMENT_OTHER): Payer: BC Managed Care – PPO

## 2011-10-01 VITALS — BP 115/65 | HR 68 | Temp 98.1°F | Resp 18

## 2011-10-01 DIAGNOSIS — C569 Malignant neoplasm of unspecified ovary: Secondary | ICD-10-CM

## 2011-10-01 DIAGNOSIS — D649 Anemia, unspecified: Secondary | ICD-10-CM

## 2011-10-01 MED ORDER — SODIUM CHLORIDE 0.9 % IJ SOLN
10.0000 mL | INTRAMUSCULAR | Status: AC | PRN
  Administered 2011-10-01: 10 mL
  Filled 2011-10-01: qty 10

## 2011-10-01 MED ORDER — HEPARIN SOD (PORK) LOCK FLUSH 100 UNIT/ML IV SOLN
500.0000 [IU] | Freq: Every day | INTRAVENOUS | Status: DC | PRN
Start: 2011-10-01 — End: 2011-10-01
  Filled 2011-10-01: qty 5

## 2011-10-01 MED ORDER — SODIUM CHLORIDE 0.9 % IV SOLN
250.0000 mL | Freq: Once | INTRAVENOUS | Status: AC
  Administered 2011-10-01: 250 mL via INTRAVENOUS

## 2011-10-01 MED ORDER — HEPARIN SOD (PORK) LOCK FLUSH 100 UNIT/ML IV SOLN
INTRAVENOUS | Status: AC
  Filled 2011-10-01: qty 5

## 2011-10-01 NOTE — Progress Notes (Signed)
Tolerated well

## 2011-10-02 LAB — TYPE AND SCREEN: Unit division: 0

## 2011-10-07 ENCOUNTER — Ambulatory Visit (HOSPITAL_COMMUNITY): Payer: BC Managed Care – PPO | Admitting: Oncology

## 2011-10-07 ENCOUNTER — Encounter (HOSPITAL_COMMUNITY): Payer: BC Managed Care – PPO | Attending: Oncology | Admitting: Oncology

## 2011-10-07 ENCOUNTER — Encounter (HOSPITAL_BASED_OUTPATIENT_CLINIC_OR_DEPARTMENT_OTHER): Payer: BC Managed Care – PPO

## 2011-10-07 VITALS — BP 98/57 | HR 71 | Wt 205.4 lb

## 2011-10-07 DIAGNOSIS — D702 Other drug-induced agranulocytosis: Secondary | ICD-10-CM | POA: Insufficient documentation

## 2011-10-07 DIAGNOSIS — C569 Malignant neoplasm of unspecified ovary: Secondary | ICD-10-CM | POA: Insufficient documentation

## 2011-10-07 DIAGNOSIS — D649 Anemia, unspecified: Secondary | ICD-10-CM

## 2011-10-07 DIAGNOSIS — I2699 Other pulmonary embolism without acute cor pulmonale: Secondary | ICD-10-CM | POA: Insufficient documentation

## 2011-10-07 DIAGNOSIS — R110 Nausea: Secondary | ICD-10-CM | POA: Insufficient documentation

## 2011-10-07 DIAGNOSIS — G629 Polyneuropathy, unspecified: Secondary | ICD-10-CM

## 2011-10-07 DIAGNOSIS — T451X5A Adverse effect of antineoplastic and immunosuppressive drugs, initial encounter: Secondary | ICD-10-CM | POA: Insufficient documentation

## 2011-10-07 DIAGNOSIS — G609 Hereditary and idiopathic neuropathy, unspecified: Secondary | ICD-10-CM

## 2011-10-07 DIAGNOSIS — Z5111 Encounter for antineoplastic chemotherapy: Secondary | ICD-10-CM

## 2011-10-07 LAB — CBC WITH DIFFERENTIAL/PLATELET
Eosinophils Relative: 0 % (ref 0–5)
HCT: 27.9 % — ABNORMAL LOW (ref 36.0–46.0)
Lymphocytes Relative: 15 % (ref 12–46)
Lymphs Abs: 0.7 10*3/uL (ref 0.7–4.0)
MCV: 96.9 fL (ref 78.0–100.0)
Platelets: 123 10*3/uL — ABNORMAL LOW (ref 150–400)
RBC: 2.88 MIL/uL — ABNORMAL LOW (ref 3.87–5.11)
WBC: 4.8 10*3/uL (ref 4.0–10.5)

## 2011-10-07 LAB — COMPREHENSIVE METABOLIC PANEL
ALT: 14 U/L (ref 0–35)
Alkaline Phosphatase: 56 U/L (ref 39–117)
CO2: 23 mEq/L (ref 19–32)
Calcium: 9.3 mg/dL (ref 8.4–10.5)
Chloride: 101 mEq/L (ref 96–112)
GFR calc Af Amer: >90 mL/min (ref >90)
GFR calc non Af Amer: >90 mL/min (ref >90)
Glucose, Bld: 160 mg/dL — ABNORMAL HIGH (ref 70–99)
Sodium: 138 mEq/L (ref 135–145)
Total Bilirubin: 0.2 mg/dL — ABNORMAL LOW (ref 0.3–1.2)

## 2011-10-07 LAB — PROTIME-INR: INR: 1.65 — ABNORMAL HIGH (ref 0.00–1.49)

## 2011-10-07 MED ORDER — FAMOTIDINE IN NACL 20-0.9 MG/50ML-% IV SOLN
INTRAVENOUS | Status: AC
  Filled 2011-10-07: qty 50

## 2011-10-07 MED ORDER — DEXAMETHASONE SODIUM PHOSPHATE 10 MG/ML IJ SOLN: 20.0000 mg | Freq: Once | INTRAMUSCULAR | Status: DC

## 2011-10-07 MED ORDER — HEPARIN SOD (PORK) LOCK FLUSH 100 UNIT/ML IV SOLN
INTRAVENOUS | Status: AC
  Filled 2011-10-07: qty 5

## 2011-10-07 MED ORDER — DIPHENHYDRAMINE HCL 50 MG/ML IJ SOLN
50.0000 mg | Freq: Once | INTRAMUSCULAR | Status: AC
  Administered 2011-10-07: 50 mg via INTRAVENOUS

## 2011-10-07 MED ORDER — SODIUM CHLORIDE 0.9 % IV SOLN
Freq: Once | INTRAVENOUS | Status: AC
  Administered 2011-10-07: 10:00:00 via INTRAVENOUS

## 2011-10-07 MED ORDER — SODIUM CHLORIDE 0.9 % IV SOLN
750.0000 mg | Freq: Once | INTRAVENOUS | Status: AC
  Administered 2011-10-07: 750 mg via INTRAVENOUS
  Filled 2011-10-07: qty 75

## 2011-10-07 MED ORDER — HEPARIN SOD (PORK) LOCK FLUSH 100 UNIT/ML IV SOLN
500.0000 [IU] | Freq: Once | INTRAVENOUS | Status: AC | PRN
  Administered 2011-10-07: 500 [IU]
  Filled 2011-10-07: qty 5

## 2011-10-07 MED ORDER — SODIUM CHLORIDE 0.9 % IJ SOLN
10.0000 mL | INTRAMUSCULAR | Status: DC | PRN
  Filled 2011-10-07: qty 10

## 2011-10-07 MED ORDER — EPOETIN ALFA 40000 UNIT/ML IJ SOLN
80000.0000 [IU] | Freq: Once | INTRAMUSCULAR | Status: AC
  Administered 2011-10-07: 80000 [IU] via SUBCUTANEOUS

## 2011-10-07 MED ORDER — FAMOTIDINE IN NACL 20-0.9 MG/50ML-% IV SOLN: 20.0000 mg | Freq: Once | INTRAVENOUS | Status: DC

## 2011-10-07 MED ORDER — SODIUM CHLORIDE 0.9 % IJ SOLN
INTRAMUSCULAR | Status: AC
  Filled 2011-10-07: qty 10

## 2011-10-07 MED ORDER — DIPHENHYDRAMINE HCL 50 MG/ML IJ SOLN
INTRAMUSCULAR | Status: AC
  Filled 2011-10-07: qty 1

## 2011-10-07 MED ORDER — SODIUM CHLORIDE 0.9 % IV SOLN: 16.0000 mg | Freq: Once | INTRAVENOUS | Status: DC

## 2011-10-07 MED ORDER — FAMOTIDINE IN NACL 20-0.9 MG/50ML-% IV SOLN
20.0000 mg | Freq: Two times a day (BID) | INTRAVENOUS | Status: DC
  Administered 2011-10-07: 20 mg via INTRAVENOUS

## 2011-10-07 MED ORDER — EPOETIN ALFA 40000 UNIT/ML IJ SOLN
INTRAMUSCULAR | Status: AC
  Filled 2011-10-07: qty 2

## 2011-10-07 MED ORDER — SODIUM CHLORIDE 0.9 % IV SOLN
Freq: Once | INTRAVENOUS | Status: AC
  Administered 2011-10-07: 16 mg via INTRAVENOUS
  Filled 2011-10-07: qty 8

## 2011-10-07 MED ORDER — PACLITAXEL CHEMO INJECTION 300 MG/50ML
175.0000 mg/m2 | Freq: Once | INTRAVENOUS | Status: AC
  Administered 2011-10-07: 360 mg via INTRAVENOUS
  Filled 2011-10-07: qty 60

## 2011-10-07 NOTE — Progress Notes (Signed)
Sarah Ruths, MD [address] [place] [address]  1. Ovarian cancer  Protime-INR    CURRENT THERAPY: S/P Cycle 4 Carbo/Taxol followed by Neupogen support. Started on 07/15/2011   INTERVAL HISTORY: Sarah Walker 61 y.o. female returns for  regular  visit for followup of Stage IA Ovarian Ca. S/P total hysterectomy and had an elevated CA 125 level postoperatively which actually rose prior to the start of chemotherapy.  Now undergoing systemic chemotherapy with Carbo/Taxol.  Dezirea continues to tolerate therapy well.  She denies any nausea or vomiting.  She recently received 2 units of PRBCs for her anemia and she reports that she feels much better.    I personally reviewed and went over laboratory results with the patient.  Her CA 125 is still pending and we will likely get that tomorrow.  Her Hgb increased appropriately after the blood transfusion.  Her K+ is mildly low at 3.2 or 3.3.  We will address in the plan below.  She is also sub therapeutic with regards to her Coumadin so this will be adjusted and is described in the plan below.   She does reports mild fingertip and tips of toes peripheral neuropathy that resolved 2-3 days prior to her next round of chemotherapy.  It does not interfer with ADLs and she was able to button  The button of her blouse today.  She denies any gait disturbances.   She is due for her 6th cycle in 3 weeks.  Complete ROS questioning is negative. She continues to work.     Past Medical History  Diagnosis Date  . Allergic rhinitis   . Sinusitis   . Hypertension   . Ovarian cancer   . Pulmonary embolism   . Peripheral neuropathy 08/26/2011    Chemotherapy-induced  . Anemia associated with chemotherapy 09/16/2011    has Ovarian cancer; Hypertension; H/O hysterectomy with oophorectomy; Pulmonary embolism; Peripheral neuropathy; and Anemia associated with chemotherapy on her problem list.     is allergic to codeine.  Ms.  Walker does not currently have medications on file.  Past Surgical History  Procedure Date  . Tonsillectomy     age 37's  . Cholecystectomy 1990  . Abdominal hysterectomy 05/25/2011    [place] bs&o and cancer staging  . Tubal ligation 1981  . Portacath placement 06/16/11    Done at [facility]  . Ivc filter 05/24/11    Denies any headaches, dizziness, double vision, fevers, chills, night sweats, nausea, vomiting, diarrhea, constipation, chest pain, heart palpitations, shortness of breath, blood in stool, black tarry stool, urinary pain, urinary burning, urinary frequency, hematuria.   PHYSICAL EXAMINATION  ECOG PERFORMANCE STATUS: 1 - Symptomatic but completely ambulatory  There were no vitals filed for this visit.  GENERAL:alert, no distress, well nourished, well developed, comfortable, cooperative, obese and smiling SKIN: skin color, texture, turgor are normal, no rashes or significant lesions HEAD: Normocephalic, No masses, lesions, tenderness or abnormalities EYES: normal, Conjunctiva are pink and non-injected EARS: External ears normal OROPHARYNX:lips, buccal mucosa, and tongue normal and mucous membranes are moist  NECK: supple, no adenopathy, trachea midline LYMPH:  no palpable lymphadenopathy BREAST:not examined LUNGS: clear to auscultation and percussion HEART: regular rate & rhythm, no murmurs, no gallops, S1 normal and S2 normal ABDOMEN:abdomen soft, non-tender, obese, normal bowel sounds, no masses or organomegaly and no hepatosplenomegaly BACK: Back symmetric, no curvature., No CVA tenderness EXTREMITIES:less then 2 second capillary refill, no joint deformities, effusion, or inflammation, no  edema, no skin discoloration, no clubbing, no cyanosis  NEURO: alert & oriented x 3 with fluent speech, no focal motor/sensory deficits, gait normal   LABORATORY DATA: CBC    Component Value Date/Time   WBC 4.8 10/07/2011 0913   RBC 2.88* 10/07/2011 0913   HGB 9.4* 10/07/2011  0913   HCT 27.9* 10/07/2011 0913   PLT 123* 10/07/2011 0913   MCV 96.9 10/07/2011 0913   MCH 32.6 10/07/2011 0913   MCHC 33.7 10/07/2011 0913   RDW 24.4* 10/07/2011 0913   LYMPHSABS 0.7 10/07/2011 0913   MONOABS 0.1 10/07/2011 0913   EOSABS 0.0 10/07/2011 0913   BASOSABS 0.0 10/07/2011 0913      Chemistry      Component Value Date/Time   NA 138 10/07/2011 0913   K 3.3* 10/07/2011 0913   CL 101 10/07/2011 0913   CO2 23 10/07/2011 0913   BUN 18 10/07/2011 0913   CREATININE 0.65 10/07/2011 0913      Component Value Date/Time   CALCIUM 9.3 10/07/2011 0913   ALKPHOS 56 10/07/2011 0913   AST 13 10/07/2011 0913   ALT 14 10/07/2011 0913   BILITOT 0.2* 10/07/2011 0913     Lab Results  Component Value Date   CA125 77.4* 09/16/2011    Lab Results  Component Value Date   INR 1.65* 10/07/2011   INR 1.93* 09/30/2011   INR 2.54* 09/22/2011     PENDING LABS: CA-125   ASSESSMENT:  1. Ovarian cancer stage IA though she had a very elevated CA 125 level postoperatively which actually rose prior to the start of chemotherapy. She is now status post 4 cycles of carboplatin and Taxol.  2. Grade 1 peripheral neuropathy  3. Pulmonary emboli on Coumadin.  4. Anemia, S/P 2 units PRBC transfusion on 09/30/2011 when her Hgb was 7.2 g/dL  PLAN:  1. I personally reviewed and went over laboratory results with the patient. 2. Patient is receiving cycle 5 of chemotherapy today.  3. Pre-chemo lab work today: CBC diff, CMET, CA 125. 4. Cycle 6 is scheduled for 10/28/2011. 5. Pre-chemo lab work ordered as a standing order for cycle 6. 6. Continue with weekly Procrit as ordered in the supportive therapy plan. 7. Take Coumadin 5 mg x 2 days and then 2.5 / 2.5 / 5 mg repeating. 8. Increase K+ to two daily x 14 days then back to daily.  9. INR on 10/10 with last Neupogen. 10. Return in 3 weeks for follow-up.    All questions were answered. The patient knows to call the clinic with any problems, questions or concerns.  We can certainly see the patient much sooner if necessary.  KEFALAS,THOMAS

## 2011-10-07 NOTE — Progress Notes (Signed)
Pt tolerated infusion well.

## 2011-10-07 NOTE — Patient Instructions (Addendum)
[facility]  Discharge Instructions Sarah Walker  DOB 1950-09-06 CSN [id number]  MRN [id number] Dr. Glenford Peers    RECOMMENDATIONS MADE BY THE CONSULTANT AND ANY TEST RESULTS WILL BE SENT TO YOUR REFERRING DOCTOR.   EXAM FINDINGS BY MD TODAY AND SIGNS AND SYMPTOMS TO REPORT TO CLINIC OR PRIMARY MD:   Coumadin 5mg  x 2 days then Coumadin 2.5/2.5/5 alternating.   Return for INR (coumadin check) on Thursday October 14, 2011  Neupogen daily at 8:30 (Fr, M, T, W, Th)  Return in 3 weeks to see Dr. Mariel Sleet  Increase your Potassium to 1 tablet twice a day x 14 days then return to taking 1 tablet a day   I acknowledge that I have been informed and understand all the instructions given to me and received a copy. I do not have any more questions at this time, but understand that I may call the Specialty Clinic at [facility] at [phone] during business hours should I have any further questions or need assistance in obtaining follow-up care.    __________________________________________  _____________  __________ Signature of Patient or Authorized Representative            Date                   Time    __________________________________________ Nurse's Signature

## 2011-10-08 ENCOUNTER — Encounter (HOSPITAL_BASED_OUTPATIENT_CLINIC_OR_DEPARTMENT_OTHER): Payer: BC Managed Care – PPO

## 2011-10-08 ENCOUNTER — Ambulatory Visit (HOSPITAL_COMMUNITY): Payer: BC Managed Care – PPO

## 2011-10-08 VITALS — BP 124/70 | HR 60

## 2011-10-08 DIAGNOSIS — C569 Malignant neoplasm of unspecified ovary: Secondary | ICD-10-CM

## 2011-10-08 DIAGNOSIS — Z5189 Encounter for other specified aftercare: Secondary | ICD-10-CM

## 2011-10-08 MED ORDER — FILGRASTIM 480 MCG/1.6ML IJ SOLN
480.0000 ug | Freq: Once | INTRAMUSCULAR | Status: AC
  Administered 2011-10-08: 480 ug via SUBCUTANEOUS
  Filled 2011-10-08: qty 1.6

## 2011-10-08 NOTE — Progress Notes (Signed)
Tolerated injection well. 

## 2011-10-09 ENCOUNTER — Encounter (HOSPITAL_BASED_OUTPATIENT_CLINIC_OR_DEPARTMENT_OTHER): Payer: BC Managed Care – PPO

## 2011-10-09 VITALS — BP 97/65 | HR 65 | Temp 97.1°F | Resp 18

## 2011-10-09 DIAGNOSIS — C569 Malignant neoplasm of unspecified ovary: Secondary | ICD-10-CM

## 2011-10-09 DIAGNOSIS — Z5189 Encounter for other specified aftercare: Secondary | ICD-10-CM

## 2011-10-09 MED ORDER — FILGRASTIM 480 MCG/1.6ML IJ SOLN
480.0000 ug | Freq: Once | INTRAMUSCULAR | Status: AC
  Administered 2011-10-09: 480 ug via SUBCUTANEOUS
  Filled 2011-10-09: qty 1.6

## 2011-10-09 NOTE — Progress Notes (Signed)
Sarah Walker presents today for injection per MD orders. Neupogen 480 mcg administered SQ in right Abdomen. Administration without incident. Patient tolerated well. No problems post chemo

## 2011-10-10 ENCOUNTER — Encounter (HOSPITAL_COMMUNITY): Payer: BC Managed Care – PPO

## 2011-10-10 DIAGNOSIS — T451X5A Adverse effect of antineoplastic and immunosuppressive drugs, initial encounter: Secondary | ICD-10-CM

## 2011-10-10 DIAGNOSIS — D701 Agranulocytosis secondary to cancer chemotherapy: Secondary | ICD-10-CM

## 2011-10-10 DIAGNOSIS — C569 Malignant neoplasm of unspecified ovary: Secondary | ICD-10-CM

## 2011-10-10 MED ORDER — FILGRASTIM 480 MCG/1.6ML IJ SOLN
480.0000 ug | Freq: Once | INTRAMUSCULAR | Status: AC
  Administered 2011-10-10: 480 ug via SUBCUTANEOUS

## 2011-10-10 MED ORDER — FILGRASTIM 480 MCG/1.6ML IJ SOLN: 480.0000 ug | Freq: Once | INTRAMUSCULAR | Status: AC

## 2011-10-10 NOTE — Progress Notes (Signed)
Sarah Walker presents today for injection per MD orders. Neupogen 480 mcg administered SQ in left Abdomen. Administration without incident. Patient tolerated well.  

## 2011-10-11 ENCOUNTER — Encounter (HOSPITAL_BASED_OUTPATIENT_CLINIC_OR_DEPARTMENT_OTHER): Payer: BC Managed Care – PPO

## 2011-10-11 VITALS — BP 97/66 | HR 69 | Temp 96.8°F | Resp 18

## 2011-10-11 DIAGNOSIS — Z5189 Encounter for other specified aftercare: Secondary | ICD-10-CM

## 2011-10-11 DIAGNOSIS — C569 Malignant neoplasm of unspecified ovary: Secondary | ICD-10-CM

## 2011-10-11 MED ORDER — FILGRASTIM 480 MCG/1.6ML IJ SOLN
480.0000 ug | Freq: Once | INTRAMUSCULAR | Status: AC
  Administered 2011-10-11: 480 ug via SUBCUTANEOUS
  Filled 2011-10-11: qty 1.6

## 2011-10-11 NOTE — Progress Notes (Signed)
Sarah Walker presents today for injection per MD orders. Neupogen 480 mcg administered SQ in right Abdomen. Administration without incident. Patient tolerated well.  

## 2011-10-12 ENCOUNTER — Encounter (HOSPITAL_BASED_OUTPATIENT_CLINIC_OR_DEPARTMENT_OTHER): Payer: BC Managed Care – PPO

## 2011-10-12 VITALS — BP 119/71 | HR 86 | Temp 98.1°F | Resp 16

## 2011-10-12 DIAGNOSIS — C569 Malignant neoplasm of unspecified ovary: Secondary | ICD-10-CM

## 2011-10-12 DIAGNOSIS — Z5189 Encounter for other specified aftercare: Secondary | ICD-10-CM

## 2011-10-12 MED ORDER — FILGRASTIM 480 MCG/1.6ML IJ SOLN
480.0000 ug | Freq: Once | INTRAMUSCULAR | Status: AC
  Administered 2011-10-12: 480 ug via SUBCUTANEOUS
  Filled 2011-10-12: qty 0.8

## 2011-10-12 NOTE — Progress Notes (Signed)
Tolerated injection well. 

## 2011-10-13 ENCOUNTER — Encounter (HOSPITAL_BASED_OUTPATIENT_CLINIC_OR_DEPARTMENT_OTHER): Payer: BC Managed Care – PPO

## 2011-10-13 VITALS — BP 114/74 | HR 80 | Temp 97.7°F | Resp 20

## 2011-10-13 DIAGNOSIS — C569 Malignant neoplasm of unspecified ovary: Secondary | ICD-10-CM

## 2011-10-13 DIAGNOSIS — Z5189 Encounter for other specified aftercare: Secondary | ICD-10-CM

## 2011-10-13 MED ORDER — FILGRASTIM 480 MCG/1.6ML IJ SOLN
480.0000 ug | Freq: Once | INTRAMUSCULAR | Status: AC
  Administered 2011-10-13: 480 ug via SUBCUTANEOUS
  Filled 2011-10-13: qty 1.6

## 2011-10-13 NOTE — Addendum Note (Signed)
Addended by: Evelena Leyden on: 10/13/2011 07:02 PM   Modules accepted: Orders

## 2011-10-13 NOTE — Progress Notes (Signed)
Sarah Walker presents today for injection per MD orders. Neupogen 480 mcg administered SQ in right Abdomen. Administration without incident. Patient tolerated well.  

## 2011-10-14 ENCOUNTER — Other Ambulatory Visit (HOSPITAL_COMMUNITY): Payer: Self-pay | Admitting: Oncology

## 2011-10-14 ENCOUNTER — Encounter (HOSPITAL_BASED_OUTPATIENT_CLINIC_OR_DEPARTMENT_OTHER): Payer: BC Managed Care – PPO

## 2011-10-14 ENCOUNTER — Encounter (HOSPITAL_COMMUNITY): Payer: BC Managed Care – PPO

## 2011-10-14 DIAGNOSIS — C569 Malignant neoplasm of unspecified ovary: Secondary | ICD-10-CM

## 2011-10-14 DIAGNOSIS — I2699 Other pulmonary embolism without acute cor pulmonale: Secondary | ICD-10-CM

## 2011-10-14 DIAGNOSIS — Z5189 Encounter for other specified aftercare: Secondary | ICD-10-CM

## 2011-10-14 LAB — CBC WITH DIFFERENTIAL/PLATELET
Hemoglobin: 10.1 g/dL — ABNORMAL LOW (ref 12.0–15.0)
Lymphocytes Relative: 31 % (ref 12–46)
Lymphs Abs: 1.6 10*3/uL (ref 0.7–4.0)
Monocytes Relative: 29 % — ABNORMAL HIGH (ref 3–12)
Neutro Abs: 2.1 10*3/uL (ref 1.7–7.7)
Neutrophils Relative %: 40 % — ABNORMAL LOW (ref 43–77)
RBC: 3.01 MIL/uL — ABNORMAL LOW (ref 3.87–5.11)

## 2011-10-14 LAB — PROTIME-INR
INR: 3.01 — ABNORMAL HIGH (ref 0.00–1.49)
Prothrombin Time: 29.6 seconds — ABNORMAL HIGH (ref 11.6–15.2)

## 2011-10-14 MED ORDER — FILGRASTIM 480 MCG/1.6ML IJ SOLN
480.0000 ug | Freq: Once | INTRAMUSCULAR | Status: AC
  Administered 2011-10-14: 480 ug via SUBCUTANEOUS
  Filled 2011-10-14: qty 1.6

## 2011-10-14 NOTE — Progress Notes (Signed)
Sarah Walker presents today for injection per MD orders. Neupogen 480 mcg administered SQ in left Abdomen. Administration without incident. Patient tolerated well.  

## 2011-10-14 NOTE — Progress Notes (Signed)
Labs drawn today for pt,cbc/diff 

## 2011-10-15 ENCOUNTER — Encounter (HOSPITAL_COMMUNITY): Payer: BC Managed Care – PPO

## 2011-10-18 ENCOUNTER — Encounter (HOSPITAL_COMMUNITY): Payer: BC Managed Care – PPO

## 2011-10-21 ENCOUNTER — Encounter (HOSPITAL_BASED_OUTPATIENT_CLINIC_OR_DEPARTMENT_OTHER): Payer: BC Managed Care – PPO

## 2011-10-21 ENCOUNTER — Encounter (HOSPITAL_COMMUNITY): Payer: BC Managed Care – PPO

## 2011-10-21 ENCOUNTER — Other Ambulatory Visit (HOSPITAL_COMMUNITY): Payer: Self-pay | Admitting: Oncology

## 2011-10-21 VITALS — BP 117/80 | HR 78 | Temp 98.1°F | Resp 16

## 2011-10-21 DIAGNOSIS — I2699 Other pulmonary embolism without acute cor pulmonale: Secondary | ICD-10-CM

## 2011-10-21 DIAGNOSIS — C569 Malignant neoplasm of unspecified ovary: Secondary | ICD-10-CM

## 2011-10-21 DIAGNOSIS — D649 Anemia, unspecified: Secondary | ICD-10-CM

## 2011-10-21 LAB — CBC WITH DIFFERENTIAL/PLATELET
Eosinophils Relative: 0 % (ref 0–5)
HCT: 28.4 % — ABNORMAL LOW (ref 36.0–46.0)
Hemoglobin: 9.4 g/dL — ABNORMAL LOW (ref 12.0–15.0)
Lymphs Abs: 1.5 10*3/uL (ref 0.7–4.0)
MCV: 102.2 fL — ABNORMAL HIGH (ref 78.0–100.0)
Monocytes Relative: 9 % (ref 3–12)
Neutro Abs: 0.8 10*3/uL — ABNORMAL LOW (ref 1.7–7.7)
Platelets: 44 10*3/uL — ABNORMAL LOW (ref 150–400)
RBC: 2.78 MIL/uL — ABNORMAL LOW (ref 3.87–5.11)
WBC: 2.5 10*3/uL — ABNORMAL LOW (ref 4.0–10.5)

## 2011-10-21 MED ORDER — EPOETIN ALFA 40000 UNIT/ML IJ SOLN
80000.0000 [IU] | Freq: Once | INTRAMUSCULAR | Status: AC
  Administered 2011-10-21: 80000 [IU] via SUBCUTANEOUS

## 2011-10-21 MED ORDER — EPOETIN ALFA 40000 UNIT/ML IJ SOLN
INTRAMUSCULAR | Status: AC
  Filled 2011-10-21: qty 2

## 2011-10-21 NOTE — Progress Notes (Signed)
Sarah Walker presents today for injection per MD orders. Procrit 80,000 units administered SQ in 2 equal divided doses in right Abdomen. Administration without incident. Patient tolerated well.  Pt instructed to continue Coumadin dose 2.5mg  2.5mg  5mg  alternating. Pt/inr next week with pre chemo labs. Verbalized understanding.

## 2011-10-21 NOTE — Progress Notes (Signed)
Labs drawn today for cbc/diff,pt 

## 2011-10-25 ENCOUNTER — Other Ambulatory Visit (HOSPITAL_COMMUNITY): Payer: Self-pay | Admitting: Oncology

## 2011-10-28 ENCOUNTER — Encounter (HOSPITAL_BASED_OUTPATIENT_CLINIC_OR_DEPARTMENT_OTHER): Payer: BC Managed Care – PPO

## 2011-10-28 ENCOUNTER — Other Ambulatory Visit (HOSPITAL_COMMUNITY): Payer: Self-pay | Admitting: Oncology

## 2011-10-28 VITALS — BP 121/74 | HR 88 | Temp 98.0°F | Resp 18

## 2011-10-28 DIAGNOSIS — I2699 Other pulmonary embolism without acute cor pulmonale: Secondary | ICD-10-CM

## 2011-10-28 DIAGNOSIS — D649 Anemia, unspecified: Secondary | ICD-10-CM

## 2011-10-28 DIAGNOSIS — E876 Hypokalemia: Secondary | ICD-10-CM

## 2011-10-28 DIAGNOSIS — R110 Nausea: Secondary | ICD-10-CM

## 2011-10-28 DIAGNOSIS — C569 Malignant neoplasm of unspecified ovary: Secondary | ICD-10-CM

## 2011-10-28 LAB — COMPREHENSIVE METABOLIC PANEL
ALT: 18 U/L (ref 0–35)
BUN: 16 mg/dL (ref 6–23)
CO2: 24 mEq/L (ref 19–32)
Calcium: 9.1 mg/dL (ref 8.4–10.5)
Creatinine, Ser: 0.7 mg/dL (ref 0.50–1.10)
GFR calc Af Amer: >90 mL/min (ref >90)
GFR calc non Af Amer: >90 mL/min (ref >90)
Glucose, Bld: 185 mg/dL — ABNORMAL HIGH (ref 70–99)
Sodium: 139 mEq/L (ref 135–145)
Total Protein: 6.6 g/dL (ref 6.0–8.3)

## 2011-10-28 LAB — CBC WITH DIFFERENTIAL/PLATELET
Eosinophils Absolute: 0 10*3/uL (ref 0.0–0.7)
Eosinophils Relative: 0 % (ref 0–5)
Lymphs Abs: 0.4 10*3/uL — ABNORMAL LOW (ref 0.7–4.0)
MCH: 34.1 pg — ABNORMAL HIGH (ref 26.0–34.0)
MCHC: 33.2 g/dL (ref 30.0–36.0)
MCV: 102.7 fL — ABNORMAL HIGH (ref 78.0–100.0)
Monocytes Absolute: 0 10*3/uL — ABNORMAL LOW (ref 0.1–1.0)
Neutrophils Relative %: 80 % — ABNORMAL HIGH (ref 43–77)
Platelets: 72 10*3/uL — ABNORMAL LOW (ref 150–400)
Smear Review: DECREASED

## 2011-10-28 LAB — PROTIME-INR: Prothrombin Time: 24.6 seconds — ABNORMAL HIGH (ref 11.6–15.2)

## 2011-10-28 LAB — CA 125: CA 125: 64.8 U/mL — ABNORMAL HIGH (ref 0.0–30.2)

## 2011-10-28 MED ORDER — EPOETIN ALFA 40000 UNIT/ML IJ SOLN
INTRAMUSCULAR | Status: AC
  Filled 2011-10-28: qty 2

## 2011-10-28 MED ORDER — HEPARIN SOD (PORK) LOCK FLUSH 100 UNIT/ML IV SOLN
500.0000 [IU] | Freq: Once | INTRAVENOUS | Status: AC | PRN
  Administered 2011-10-28: 500 [IU]
  Filled 2011-10-28: qty 5

## 2011-10-28 MED ORDER — EPOETIN ALFA 40000 UNIT/ML IJ SOLN
80000.0000 [IU] | Freq: Once | INTRAMUSCULAR | Status: AC
  Administered 2011-10-28: 80000 [IU] via SUBCUTANEOUS

## 2011-10-28 MED ORDER — POTASSIUM CHLORIDE CRYS ER 20 MEQ PO TBCR: EXTENDED_RELEASE_TABLET | ORAL | Status: DC

## 2011-10-28 MED ORDER — SODIUM CHLORIDE 0.9 % IV SOLN
Freq: Once | INTRAVENOUS | Status: AC
  Administered 2011-10-28: 09:00:00 via INTRAVENOUS

## 2011-10-28 MED ORDER — LORAZEPAM 1 MG PO TABS: ORAL_TABLET | ORAL | Status: DC

## 2011-10-28 MED ORDER — SODIUM CHLORIDE 0.9 % IJ SOLN
10.0000 mL | INTRAMUSCULAR | Status: DC | PRN
  Filled 2011-10-28: qty 10

## 2011-10-28 MED ORDER — HEPARIN SOD (PORK) LOCK FLUSH 100 UNIT/ML IV SOLN
INTRAVENOUS | Status: AC
  Filled 2011-10-28: qty 5

## 2011-10-28 NOTE — Addendum Note (Signed)
Addended by: Edythe Lynn A on: 10/28/2011 12:04 PM   Modules accepted: Orders

## 2011-10-28 NOTE — Progress Notes (Signed)
Chemo held due to platelets 72000.   Sarah Walker presents today for injection per MD orders. Procrit 16109 units administered SQ in left Abdomen. Administration without incident. Patient tolerated well.

## 2011-10-28 NOTE — Patient Instructions (Addendum)
[facility] Discharge Instructions for Patients Receiving Chemotherapy  Today your chemo was held because your platelets were 72000, they need to be 100000. Your pt level is good. INR is 2.34. Take your same dose coumadin. Will recheck next week. Your potassium was low and we want you to take your potassium pill 3 times a day for 2 weeks then take twice a day.     If you develop nausea and vomiting that is not controlled by your nausea medication, call the clinic. If it is after clinic hours your family physician or the after hours number for the clinic or go to the Emergency Department.   BELOW ARE SYMPTOMS THAT SHOULD BE REPORTED IMMEDIATELY:  *FEVER GREATER THAN 101.0 F  *CHILLS WITH OR WITHOUT FEVER  NAUSEA AND VOMITING THAT IS NOT CONTROLLED WITH YOUR NAUSEA MEDICATION  *UNUSUAL SHORTNESS OF BREATH  *UNUSUAL BRUISING OR BLEEDING  TENDERNESS IN MOUTH AND THROAT WITH OR WITHOUT PRESENCE OF ULCERS  *URINARY PROBLEMS  *BOWEL PROBLEMS  UNUSUAL RASH Items with * indicate a potential emergency and should be followed up as soon as possible.  One of the nurses will contact you 24 hours after your treatment. Please let the nurse know about any problems that you may have experienced. Feel free to call the clinic you have any questions or concerns. The clinic phone number is [phone].   I have been informed and understand all the instructions given to me. I know to contact the clinic, my physician, or go to the Emergency Department if any problems should occur. I do not have any questions at this time, but understand that I may call the clinic during office hours or the Patient Navigator at [phone] should I have any questions or need assistance in obtaining follow up care.    __________________________________________  _____________  __________ Signature of Patient or Authorized Representative            Date                    Time    __________________________________________ Nurse's Signature

## 2011-10-29 ENCOUNTER — Ambulatory Visit (HOSPITAL_COMMUNITY): Payer: BC Managed Care – PPO

## 2011-10-30 ENCOUNTER — Ambulatory Visit (HOSPITAL_COMMUNITY): Payer: BC Managed Care – PPO

## 2011-10-31 ENCOUNTER — Ambulatory Visit (HOSPITAL_COMMUNITY): Payer: BC Managed Care – PPO

## 2011-11-01 ENCOUNTER — Encounter (HOSPITAL_BASED_OUTPATIENT_CLINIC_OR_DEPARTMENT_OTHER): Payer: BC Managed Care – PPO | Admitting: Oncology

## 2011-11-01 ENCOUNTER — Ambulatory Visit (HOSPITAL_COMMUNITY): Payer: BC Managed Care – PPO | Admitting: Oncology

## 2011-11-01 ENCOUNTER — Ambulatory Visit (HOSPITAL_COMMUNITY): Payer: BC Managed Care – PPO

## 2011-11-01 VITALS — BP 111/78 | HR 72 | Temp 97.0°F | Resp 20 | Wt 203.0 lb

## 2011-11-01 DIAGNOSIS — D696 Thrombocytopenia, unspecified: Secondary | ICD-10-CM

## 2011-11-01 DIAGNOSIS — G620 Drug-induced polyneuropathy: Secondary | ICD-10-CM

## 2011-11-01 DIAGNOSIS — R971 Elevated cancer antigen 125 [CA 125]: Secondary | ICD-10-CM

## 2011-11-01 DIAGNOSIS — C569 Malignant neoplasm of unspecified ovary: Secondary | ICD-10-CM

## 2011-11-01 DIAGNOSIS — Z86711 Personal history of pulmonary embolism: Secondary | ICD-10-CM

## 2011-11-01 NOTE — Patient Instructions (Addendum)
[facility] Specialty Clinic  Discharge Instructions  RECOMMENDATIONS MADE BY THE CONSULTANT AND ANY TEST RESULTS WILL BE SENT TO YOUR REFERRING DOCTOR.   EXAM FINDINGS BY MD TODAY AND SIGNS AND SYMPTOMS TO REPORT TO CLINIC OR PRIMARY MD: Exam per Dr. Mariel Sleet. When they call you about removal of filter, ask them to call Dr. Mariel Sleet- [phone] You may have flu shot 2 weeks after chemo Pneumonia shot is optional for you INSTRUCTIONS GIVEN AND DISCUSSED: Pt level in 3 weeks Ct scan in 4 weeks See Dr. Mariel Sleet in 6 weeks SPECIAL INSTRUCTIONS/FOLLOW-UP:    I acknowledge that I have been informed and understand all the instructions given to me and received a copy. I do not have any more questions at this time, but understand that I may call the Specialty Clinic at [facility] at [phone] during business hours should I have any further questions or need assistance in obtaining follow-up care.    __________________________________________  _____________  __________ Signature of Patient or Authorized Representative            Date                   Time    __________________________________________ Nurse's Signature

## 2011-11-01 NOTE — Progress Notes (Signed)
Problem #1 stage IA ovarian cancer though she had a persistently elevated CA 125 level. It remains mildly elevated however status post 5 cycles of carboplatin and Taxol Problem #2 thrombocytopenia delaying her chemotherapy one week Problem #3 grade 1 peripheral neuropathy secondary to Taxol Problem #4 pulmonary emboli at presentation with her ovarian cancer. There are multiple pulmonary emboli and she is on Coumadin. She is status post IVC filter placement in May 2013 and it will be removed in the next 4-5 weeks. She is at much less risk for recurrent disease and she is had surgery and chemotherapy. When she receives a phone call from the physician who we will remove it I have asked her to have them call me since we could either to stop the Coumadin for a few days or transition her based upon our conversation. Problem #5 obesity Very pleasant lady who had to have chemotherapy delayed last week due to thrombocytopenia. Her platelets hopefully will be recovered by Thursday. She looks good today without shortness of breath or leg swelling etc. Bowels are working well she states. She has no bowel pain. Vital signs are stable and recorded. Lymph nodes are negative throughout. She is slightly pale. Nails are unremarkable. Lungs are clear. Heart shows a regular rhythm and rate without murmur rub or gallop. Abdomen is soft nontender without organomegaly without ascites and without obvious masses. She has no leg edema. She still has tingling of her fingertips sometimes down to the MCP joints and upper toes down to the MTP joints but not upper ankles or upper arms.  The neuropathy should improve hopefully.  I am concerned about her elevated CA 125 level but we will perform a CT of the abdomen and pelvis with contrast 4 weeks after cycle 6 as our new baseline. We will then have to monitor her counts on a regular basis. I will see her in 6 weeks. By then she should have had her chemotherapy, her CAT scan, and her IVC  filter removed. I personally would recommend anticoagulation for 12 months since she had multiple pulmonary emboli

## 2011-11-03 ENCOUNTER — Ambulatory Visit (HOSPITAL_COMMUNITY): Payer: BC Managed Care – PPO

## 2011-11-04 ENCOUNTER — Ambulatory Visit (HOSPITAL_COMMUNITY): Payer: BC Managed Care – PPO

## 2011-11-04 ENCOUNTER — Other Ambulatory Visit (HOSPITAL_COMMUNITY): Payer: Self-pay | Admitting: Oncology

## 2011-11-04 ENCOUNTER — Encounter (HOSPITAL_BASED_OUTPATIENT_CLINIC_OR_DEPARTMENT_OTHER): Payer: BC Managed Care – PPO

## 2011-11-04 VITALS — BP 104/60 | HR 91 | Temp 97.7°F | Resp 18 | Wt 203.7 lb

## 2011-11-04 DIAGNOSIS — I2699 Other pulmonary embolism without acute cor pulmonale: Secondary | ICD-10-CM

## 2011-11-04 DIAGNOSIS — Z5111 Encounter for antineoplastic chemotherapy: Secondary | ICD-10-CM

## 2011-11-04 DIAGNOSIS — D649 Anemia, unspecified: Secondary | ICD-10-CM

## 2011-11-04 DIAGNOSIS — C569 Malignant neoplasm of unspecified ovary: Secondary | ICD-10-CM

## 2011-11-04 LAB — CBC WITH DIFFERENTIAL/PLATELET
Basophils Absolute: 0 10*3/uL (ref 0.0–0.1)
Basophils Relative: 0 % (ref 0–1)
Eosinophils Relative: 0 % (ref 0–5)
HCT: 30.4 % — ABNORMAL LOW (ref 36.0–46.0)
Hemoglobin: 10 g/dL — ABNORMAL LOW (ref 12.0–15.0)
Lymphocytes Relative: 16 % (ref 12–46)
MCHC: 32.9 g/dL (ref 30.0–36.0)
MCV: 107.4 fL — ABNORMAL HIGH (ref 78.0–100.0)
Monocytes Absolute: 0 10*3/uL — ABNORMAL LOW (ref 0.1–1.0)
Monocytes Relative: 1 % — ABNORMAL LOW (ref 3–12)
Neutro Abs: 2.1 10*3/uL (ref 1.7–7.7)
RDW: 23.9 % — ABNORMAL HIGH (ref 11.5–15.5)

## 2011-11-04 LAB — BASIC METABOLIC PANEL
BUN: 14 mg/dL (ref 6–23)
CO2: 24 mEq/L (ref 19–32)
Calcium: 9 mg/dL (ref 8.4–10.5)
Chloride: 103 mEq/L (ref 96–112)
Creatinine, Ser: 0.77 mg/dL (ref 0.50–1.10)

## 2011-11-04 LAB — PROTIME-INR: INR: 2.54 — ABNORMAL HIGH (ref 0.00–1.49)

## 2011-11-04 MED ORDER — PACLITAXEL CHEMO INJECTION 300 MG/50ML
175.0000 mg/m2 | Freq: Once | INTRAVENOUS | Status: AC
  Administered 2011-11-04: 360 mg via INTRAVENOUS
  Filled 2011-11-04: qty 60

## 2011-11-04 MED ORDER — HEPARIN SOD (PORK) LOCK FLUSH 100 UNIT/ML IV SOLN
INTRAVENOUS | Status: AC
  Filled 2011-11-04: qty 5

## 2011-11-04 MED ORDER — DIPHENHYDRAMINE HCL 50 MG/ML IJ SOLN
50.0000 mg | Freq: Once | INTRAMUSCULAR | Status: AC
  Administered 2011-11-04: 50 mg via INTRAVENOUS

## 2011-11-04 MED ORDER — EPOETIN ALFA 40000 UNIT/ML IJ SOLN
INTRAMUSCULAR | Status: AC
  Filled 2011-11-04: qty 2

## 2011-11-04 MED ORDER — SODIUM CHLORIDE 0.9 % IV SOLN: 16.0000 mg | Freq: Once | INTRAVENOUS | Status: DC

## 2011-11-04 MED ORDER — FAMOTIDINE IN NACL 20-0.9 MG/50ML-% IV SOLN
20.0000 mg | Freq: Once | INTRAVENOUS | Status: AC
  Administered 2011-11-04: 20 mg via INTRAVENOUS

## 2011-11-04 MED ORDER — SODIUM CHLORIDE 0.9 % IJ SOLN
INTRAMUSCULAR | Status: AC
  Filled 2011-11-04: qty 10

## 2011-11-04 MED ORDER — SODIUM CHLORIDE 0.9 % IV SOLN
750.0000 mg | Freq: Once | INTRAVENOUS | Status: AC
  Administered 2011-11-04: 750 mg via INTRAVENOUS
  Filled 2011-11-04: qty 75

## 2011-11-04 MED ORDER — DEXAMETHASONE SODIUM PHOSPHATE 10 MG/ML IJ SOLN: 20.0000 mg | Freq: Once | INTRAMUSCULAR | Status: DC

## 2011-11-04 MED ORDER — SODIUM CHLORIDE 0.9 % IV SOLN
Freq: Once | INTRAVENOUS | Status: AC
  Administered 2011-11-04: 10:00:00 via INTRAVENOUS

## 2011-11-04 MED ORDER — HEPARIN SOD (PORK) LOCK FLUSH 100 UNIT/ML IV SOLN
500.0000 [IU] | Freq: Once | INTRAVENOUS | Status: AC | PRN
  Administered 2011-11-04: 500 [IU]
  Filled 2011-11-04: qty 5

## 2011-11-04 MED ORDER — SODIUM CHLORIDE 0.9 % IV SOLN
Freq: Once | INTRAVENOUS | Status: AC
  Administered 2011-11-04: 16 mg via INTRAVENOUS
  Filled 2011-11-04: qty 8

## 2011-11-04 MED ORDER — EPOETIN ALFA 40000 UNIT/ML IJ SOLN
80000.0000 [IU] | Freq: Once | INTRAMUSCULAR | Status: AC
  Administered 2011-11-04: 80000 [IU] via SUBCUTANEOUS

## 2011-11-04 MED ORDER — FAMOTIDINE IN NACL 20-0.9 MG/50ML-% IV SOLN
INTRAVENOUS | Status: AC
  Filled 2011-11-04: qty 50

## 2011-11-04 MED ORDER — DIPHENHYDRAMINE HCL 50 MG/ML IJ SOLN
INTRAMUSCULAR | Status: AC
  Filled 2011-11-04: qty 1

## 2011-11-05 ENCOUNTER — Encounter (HOSPITAL_COMMUNITY): Payer: BC Managed Care – PPO | Attending: Oncology

## 2011-11-05 VITALS — BP 109/79 | HR 65 | Temp 97.9°F | Resp 18

## 2011-11-05 DIAGNOSIS — C569 Malignant neoplasm of unspecified ovary: Secondary | ICD-10-CM

## 2011-11-05 DIAGNOSIS — I2699 Other pulmonary embolism without acute cor pulmonale: Secondary | ICD-10-CM | POA: Insufficient documentation

## 2011-11-05 DIAGNOSIS — Z5189 Encounter for other specified aftercare: Secondary | ICD-10-CM

## 2011-11-05 DIAGNOSIS — T451X5A Adverse effect of antineoplastic and immunosuppressive drugs, initial encounter: Secondary | ICD-10-CM | POA: Insufficient documentation

## 2011-11-05 MED ORDER — FILGRASTIM 480 MCG/1.6ML IJ SOLN
480.0000 ug | Freq: Once | INTRAMUSCULAR | Status: AC
  Administered 2011-11-05: 480 ug via SUBCUTANEOUS
  Filled 2011-11-05: qty 1.6

## 2011-11-05 NOTE — Progress Notes (Signed)
Sarah Walker presents today for injection per MD orders. Neupogen 480 mcg administered SQ in left Abdomen. Administration without incident. Patient tolerated well.

## 2011-11-06 ENCOUNTER — Encounter (HOSPITAL_BASED_OUTPATIENT_CLINIC_OR_DEPARTMENT_OTHER): Payer: BC Managed Care – PPO

## 2011-11-06 VITALS — BP 114/73 | HR 65

## 2011-11-06 DIAGNOSIS — C569 Malignant neoplasm of unspecified ovary: Secondary | ICD-10-CM

## 2011-11-06 DIAGNOSIS — Z5189 Encounter for other specified aftercare: Secondary | ICD-10-CM

## 2011-11-06 MED ORDER — FILGRASTIM 480 MCG/1.6ML IJ SOLN
480.0000 ug | Freq: Once | INTRAMUSCULAR | Status: AC
  Administered 2011-11-06: 480 ug via SUBCUTANEOUS

## 2011-11-06 NOTE — Progress Notes (Signed)
Neupogen 480 mcg to lower abd tissue.  Tolerated well.

## 2011-11-07 ENCOUNTER — Encounter (HOSPITAL_BASED_OUTPATIENT_CLINIC_OR_DEPARTMENT_OTHER): Payer: BC Managed Care – PPO

## 2011-11-07 VITALS — BP 118/78 | HR 70

## 2011-11-07 DIAGNOSIS — Z5189 Encounter for other specified aftercare: Secondary | ICD-10-CM

## 2011-11-07 DIAGNOSIS — C569 Malignant neoplasm of unspecified ovary: Secondary | ICD-10-CM

## 2011-11-07 MED ORDER — FILGRASTIM 480 MCG/1.6ML IJ SOLN
480.0000 ug | Freq: Once | INTRAMUSCULAR | Status: AC
  Administered 2011-11-07: 480 ug via SUBCUTANEOUS

## 2011-11-07 NOTE — Progress Notes (Signed)
Tolerated inj well.

## 2011-11-08 ENCOUNTER — Encounter (HOSPITAL_BASED_OUTPATIENT_CLINIC_OR_DEPARTMENT_OTHER): Payer: BC Managed Care – PPO

## 2011-11-08 ENCOUNTER — Telehealth (HOSPITAL_COMMUNITY): Payer: Self-pay | Admitting: *Deleted

## 2011-11-08 VITALS — BP 113/77 | HR 72 | Temp 97.3°F | Resp 18

## 2011-11-08 DIAGNOSIS — C569 Malignant neoplasm of unspecified ovary: Secondary | ICD-10-CM

## 2011-11-08 DIAGNOSIS — Z5189 Encounter for other specified aftercare: Secondary | ICD-10-CM

## 2011-11-08 MED ORDER — FILGRASTIM 480 MCG/1.6ML IJ SOLN
480.0000 ug | Freq: Once | INTRAMUSCULAR | Status: AC
  Administered 2011-11-08: 480 ug via SUBCUTANEOUS
  Filled 2011-11-08: qty 1.6

## 2011-11-08 NOTE — Telephone Encounter (Signed)
Pt is set up for flu vaccine on 11/15. She wants to know when and if she should take shingles vaccine.

## 2011-11-08 NOTE — Progress Notes (Signed)
[name] presents today for injection per MD orders. Neupogen 480 mcg administered SQ in right Abdomen. Administration without incident. Patient tolerated well.

## 2011-11-08 NOTE — Telephone Encounter (Signed)
Pt notified that she could get the shingles vaccination 3-6 months from the completion of chemo. She said ok. Pt verified that she had chickenpox as a child.

## 2011-11-09 ENCOUNTER — Encounter (HOSPITAL_BASED_OUTPATIENT_CLINIC_OR_DEPARTMENT_OTHER): Payer: BC Managed Care – PPO

## 2011-11-09 VITALS — BP 117/78 | HR 83 | Temp 97.3°F | Resp 18

## 2011-11-09 DIAGNOSIS — Z5189 Encounter for other specified aftercare: Secondary | ICD-10-CM

## 2011-11-09 DIAGNOSIS — C569 Malignant neoplasm of unspecified ovary: Secondary | ICD-10-CM

## 2011-11-09 MED ORDER — FILGRASTIM 480 MCG/1.6ML IJ SOLN
480.0000 ug | Freq: Once | INTRAMUSCULAR | Status: AC
  Administered 2011-11-09: 480 ug via SUBCUTANEOUS
  Filled 2011-11-09: qty 1.6

## 2011-11-09 NOTE — Progress Notes (Signed)
Sarah Walker presents today for injection per MD orders. Neupogen 480 mcg administered SQ in left Abdomen. Administration without incident. Patient tolerated well.  

## 2011-11-10 ENCOUNTER — Encounter (HOSPITAL_BASED_OUTPATIENT_CLINIC_OR_DEPARTMENT_OTHER): Payer: BC Managed Care – PPO

## 2011-11-10 VITALS — BP 119/80 | HR 82 | Temp 98.4°F | Resp 18

## 2011-11-10 DIAGNOSIS — Z5189 Encounter for other specified aftercare: Secondary | ICD-10-CM

## 2011-11-10 DIAGNOSIS — C569 Malignant neoplasm of unspecified ovary: Secondary | ICD-10-CM

## 2011-11-10 MED ORDER — FILGRASTIM 480 MCG/1.6ML IJ SOLN
480.0000 ug | Freq: Once | INTRAMUSCULAR | Status: AC
  Administered 2011-11-10: 480 ug via SUBCUTANEOUS
  Filled 2011-11-10: qty 1.6

## 2011-11-10 NOTE — Progress Notes (Signed)
Sarah Walker presents today for injection per MD orders. Neupogen 480 mcg administered SQ in left Abdomen. Administration without incident. Patient tolerated well.  

## 2011-11-11 ENCOUNTER — Encounter (HOSPITAL_BASED_OUTPATIENT_CLINIC_OR_DEPARTMENT_OTHER): Payer: BC Managed Care – PPO

## 2011-11-11 VITALS — BP 111/69 | HR 79 | Temp 97.7°F | Resp 16

## 2011-11-11 DIAGNOSIS — Z5189 Encounter for other specified aftercare: Secondary | ICD-10-CM

## 2011-11-11 DIAGNOSIS — C569 Malignant neoplasm of unspecified ovary: Secondary | ICD-10-CM

## 2011-11-11 MED ORDER — FILGRASTIM 480 MCG/1.6ML IJ SOLN
480.0000 ug | Freq: Once | INTRAMUSCULAR | Status: AC
  Administered 2011-11-11: 480 ug via SUBCUTANEOUS
  Filled 2011-11-11: qty 1.6

## 2011-11-11 NOTE — Progress Notes (Signed)
Sarah Walker presents today for injection per MD orders. Neupogen 480 mcg administered SQ in left Abdomen. Administration without incident. Patient tolerated well.  

## 2011-11-17 ENCOUNTER — Other Ambulatory Visit (HOSPITAL_COMMUNITY): Payer: Self-pay | Admitting: Oncology

## 2011-11-17 DIAGNOSIS — C569 Malignant neoplasm of unspecified ovary: Secondary | ICD-10-CM

## 2011-11-17 DIAGNOSIS — J329 Chronic sinusitis, unspecified: Secondary | ICD-10-CM

## 2011-11-17 MED ORDER — AMOXICILLIN-POT CLAVULANATE 875-125 MG PO TABS: 1.0000 | ORAL_TABLET | Freq: Two times a day (BID) | ORAL | Status: AC

## 2011-11-17 MED ORDER — FLUCONAZOLE 100 MG PO TABS: ORAL_TABLET | ORAL | Status: AC

## 2011-11-19 ENCOUNTER — Encounter (HOSPITAL_BASED_OUTPATIENT_CLINIC_OR_DEPARTMENT_OTHER): Payer: BC Managed Care – PPO

## 2011-11-19 ENCOUNTER — Encounter (HOSPITAL_COMMUNITY): Payer: BC Managed Care – PPO

## 2011-11-19 VITALS — BP 118/84 | HR 79 | Resp 18

## 2011-11-19 DIAGNOSIS — C569 Malignant neoplasm of unspecified ovary: Secondary | ICD-10-CM

## 2011-11-19 DIAGNOSIS — D649 Anemia, unspecified: Secondary | ICD-10-CM

## 2011-11-19 DIAGNOSIS — I2699 Other pulmonary embolism without acute cor pulmonale: Secondary | ICD-10-CM

## 2011-11-19 LAB — CBC WITH DIFFERENTIAL/PLATELET
Basophils Absolute: 0 10*3/uL (ref 0.0–0.1)
Basophils Relative: 0 % (ref 0–1)
Lymphocytes Relative: 52 % — ABNORMAL HIGH (ref 12–46)
MCHC: 33.5 g/dL (ref 30.0–36.0)
Neutro Abs: 0.9 10*3/uL — ABNORMAL LOW (ref 1.7–7.7)
Neutrophils Relative %: 40 % — ABNORMAL LOW (ref 43–77)
Platelets: 26 10*3/uL — CL (ref 150–400)
RDW: 19.9 % — ABNORMAL HIGH (ref 11.5–15.5)
WBC: 2.3 10*3/uL — ABNORMAL LOW (ref 4.0–10.5)

## 2011-11-19 LAB — COMPREHENSIVE METABOLIC PANEL
ALT: 18 U/L (ref 0–35)
AST: 14 U/L (ref 0–37)
Albumin: 3.7 g/dL (ref 3.5–5.2)
Calcium: 9.2 mg/dL (ref 8.4–10.5)
GFR calc Af Amer: >90 mL/min (ref >90)
Glucose, Bld: 92 mg/dL (ref 70–99)
Potassium: 3.8 mEq/L (ref 3.5–5.1)
Sodium: 138 mEq/L (ref 135–145)
Total Protein: 6.6 g/dL (ref 6.0–8.3)

## 2011-11-19 LAB — PROTIME-INR
INR: 2.42 — ABNORMAL HIGH (ref 0.00–1.49)
Prothrombin Time: 25.2 seconds — ABNORMAL HIGH (ref 11.6–15.2)

## 2011-11-19 MED ORDER — EPOETIN ALFA 40000 UNIT/ML IJ SOLN
80000.0000 [IU] | Freq: Once | INTRAMUSCULAR | Status: AC
  Administered 2011-11-19: 80000 [IU] via SUBCUTANEOUS

## 2011-11-19 MED ORDER — INFLUENZA VIRUS VACC SPLIT PF IM SUSP
INTRAMUSCULAR | Status: AC
  Filled 2011-11-19: qty 0.5

## 2011-11-19 MED ORDER — EPOETIN ALFA 40000 UNIT/ML IJ SOLN
INTRAMUSCULAR | Status: AC
  Filled 2011-11-19: qty 2

## 2011-11-19 NOTE — Addendum Note (Signed)
Addended by: Corena Herter D on: 11/19/2011 10:49 AM   Modules accepted: Orders, SmartSet

## 2011-11-19 NOTE — Progress Notes (Signed)
Labs drawn today for pt,cmp,cbc/diff

## 2011-11-19 NOTE — Progress Notes (Signed)
Sarah Walker presents today for injection per MD orders. Procrit 80,000 administered SQ in right Abdomen. Administration without incident. Patient tolerated well.

## 2011-11-19 NOTE — Progress Notes (Signed)
CRITICAL VALUE ALERT Critical value received:  Platelets 26K Date of notification:  11/19/2011  Time of notification: 1020 Critical value read back:  yes Nurse who received alert:  Payton Doughty, RN MD notified (1st page):  Dr. Mariel Sleet  11/19/2011 1025 Per Dr. Mariel Sleet, patient needs to be placed on bleeding precautions and informed of appropriate follow-up should bleeding occur; assess for bleeding as well.  I spoke w/ [name], and she reports only a small amt of occasional bleeding from her nose, which resolves quickly.  Sarah Walker was also advised to come in for a repeat CBC on Monday, repeat INR in 3 weeks, and to continue current coumadin dosage.  Patient scheduled accordingly and verbalized understanding of education and follow-up.  She is also aware she needs to come in for her procrit injection this afternoon.

## 2011-11-22 ENCOUNTER — Other Ambulatory Visit (HOSPITAL_COMMUNITY): Payer: BC Managed Care – PPO

## 2011-11-22 ENCOUNTER — Encounter (HOSPITAL_BASED_OUTPATIENT_CLINIC_OR_DEPARTMENT_OTHER): Payer: BC Managed Care – PPO

## 2011-11-22 ENCOUNTER — Other Ambulatory Visit (HOSPITAL_COMMUNITY): Payer: Self-pay | Admitting: Oncology

## 2011-11-22 DIAGNOSIS — C569 Malignant neoplasm of unspecified ovary: Secondary | ICD-10-CM

## 2011-11-22 LAB — CBC
MCH: 36.3 pg — ABNORMAL HIGH (ref 26.0–34.0)
MCHC: 34 g/dL (ref 30.0–36.0)
Platelets: 30 10*3/uL — ABNORMAL LOW (ref 150–400)
RDW: 19.9 % — ABNORMAL HIGH (ref 11.5–15.5)

## 2011-11-22 NOTE — Progress Notes (Signed)
Labs drawn today for cbc/diff 

## 2011-11-26 ENCOUNTER — Encounter (HOSPITAL_BASED_OUTPATIENT_CLINIC_OR_DEPARTMENT_OTHER): Payer: BC Managed Care – PPO

## 2011-11-26 VITALS — BP 117/70 | HR 78

## 2011-11-26 DIAGNOSIS — D649 Anemia, unspecified: Secondary | ICD-10-CM

## 2011-11-26 DIAGNOSIS — C569 Malignant neoplasm of unspecified ovary: Secondary | ICD-10-CM

## 2011-11-26 LAB — CBC WITH DIFFERENTIAL/PLATELET
Basophils Absolute: 0 10*3/uL (ref 0.0–0.1)
Eosinophils Relative: 1 % (ref 0–5)
Lymphocytes Relative: 53 % — ABNORMAL HIGH (ref 12–46)
MCV: 108.5 fL — ABNORMAL HIGH (ref 78.0–100.0)
Neutro Abs: 0.7 10*3/uL — ABNORMAL LOW (ref 1.7–7.7)
Neutrophils Relative %: 32 % — ABNORMAL LOW (ref 43–77)
Platelets: 127 10*3/uL — ABNORMAL LOW (ref 150–400)
RDW: 21.2 % — ABNORMAL HIGH (ref 11.5–15.5)
WBC: 2.2 10*3/uL — ABNORMAL LOW (ref 4.0–10.5)

## 2011-11-26 MED ORDER — EPOETIN ALFA 40000 UNIT/ML IJ SOLN
80000.0000 [IU] | Freq: Once | INTRAMUSCULAR | Status: AC
  Administered 2011-11-26: 80000 [IU] via SUBCUTANEOUS

## 2011-11-26 MED ORDER — EPOETIN ALFA 40000 UNIT/ML IJ SOLN
INTRAMUSCULAR | Status: AC
Start: 2011-11-26 — End: 2011-11-26
  Filled 2011-11-26: qty 2

## 2011-11-26 NOTE — Progress Notes (Signed)
Labs drawn today for cbc/diff 

## 2011-11-26 NOTE — Progress Notes (Signed)
Tolerated injection well. 

## 2011-11-29 ENCOUNTER — Other Ambulatory Visit (HOSPITAL_COMMUNITY): Payer: BC Managed Care – PPO

## 2011-12-01 ENCOUNTER — Other Ambulatory Visit (HOSPITAL_COMMUNITY): Payer: Self-pay | Admitting: Oncology

## 2011-12-01 ENCOUNTER — Ambulatory Visit (HOSPITAL_COMMUNITY)
Admission: RE | Admit: 2011-12-01 | Discharge: 2011-12-01 | Disposition: A | Discharge status: Home / Self Care | Payer: BC Managed Care – PPO | Source: Ambulatory Visit | Attending: Oncology | Admitting: Oncology

## 2011-12-01 ENCOUNTER — Telehealth (HOSPITAL_COMMUNITY): Payer: Self-pay | Admitting: Oncology

## 2011-12-01 DIAGNOSIS — R188 Other ascites: Secondary | ICD-10-CM | POA: Insufficient documentation

## 2011-12-01 DIAGNOSIS — C569 Malignant neoplasm of unspecified ovary: Secondary | ICD-10-CM

## 2011-12-01 MED ORDER — IOHEXOL 300 MG/ML  SOLN
100.0000 mL | Freq: Once | INTRAMUSCULAR | Status: AC | PRN
  Administered 2011-12-01: 100 mL via INTRAVENOUS

## 2011-12-01 NOTE — Progress Notes (Signed)
I spoke with the patient today about her CAT scan results. She does have a clean CAT scan except for an 8 x 9 mm area close to the so as muscle on the right that is nonspecific. I reviewed the CAT scan with Dr. Denny Levy in radiology and he also agrees with Dr. Rito Ehrlich in radiology that this is nonspecific and may need followup. So I told the patient that we need to see what her cancer markers to and may need to repeat this CT scan in 4-6 months based upon what they do. She was quite fine with this discussion and will continue see me scheduled in early December

## 2011-12-01 NOTE — Telephone Encounter (Signed)
See other note

## 2011-12-05 ENCOUNTER — Other Ambulatory Visit (HOSPITAL_COMMUNITY): Payer: Self-pay | Admitting: Oncology

## 2011-12-06 ENCOUNTER — Other Ambulatory Visit (HOSPITAL_COMMUNITY): Payer: Self-pay | Admitting: Oncology

## 2011-12-06 ENCOUNTER — Encounter (HOSPITAL_COMMUNITY): Payer: BC Managed Care – PPO | Attending: Oncology

## 2011-12-06 ENCOUNTER — Ambulatory Visit (HOSPITAL_COMMUNITY): Payer: BC Managed Care – PPO

## 2011-12-06 DIAGNOSIS — Z0000 Encounter for general adult medical examination without abnormal findings: Secondary | ICD-10-CM | POA: Insufficient documentation

## 2011-12-06 DIAGNOSIS — T451X5A Adverse effect of antineoplastic and immunosuppressive drugs, initial encounter: Secondary | ICD-10-CM | POA: Insufficient documentation

## 2011-12-06 DIAGNOSIS — C569 Malignant neoplasm of unspecified ovary: Secondary | ICD-10-CM | POA: Insufficient documentation

## 2011-12-06 DIAGNOSIS — I2699 Other pulmonary embolism without acute cor pulmonale: Secondary | ICD-10-CM | POA: Insufficient documentation

## 2011-12-06 LAB — CBC WITH DIFFERENTIAL/PLATELET
Basophils Absolute: 0 10*3/uL (ref 0.0–0.1)
Basophils Relative: 1 % (ref 0–1)
Eosinophils Absolute: 0 10*3/uL (ref 0.0–0.7)
Eosinophils Relative: 1 % (ref 0–5)
HCT: 34.4 % — ABNORMAL LOW (ref 36.0–46.0)
Lymphocytes Relative: 44 % (ref 12–46)
MCH: 35.7 pg — ABNORMAL HIGH (ref 26.0–34.0)
MCHC: 32.3 g/dL (ref 30.0–36.0)
MCV: 110.6 fL — ABNORMAL HIGH (ref 78.0–100.0)
Monocytes Absolute: 0.4 10*3/uL (ref 0.1–1.0)
RDW: 20.2 % — ABNORMAL HIGH (ref 11.5–15.5)

## 2011-12-06 NOTE — Progress Notes (Signed)
Labs drawn today for cbc/diff 

## 2011-12-10 ENCOUNTER — Ambulatory Visit (HOSPITAL_COMMUNITY): Payer: BC Managed Care – PPO

## 2011-12-10 ENCOUNTER — Encounter (HOSPITAL_COMMUNITY): Payer: BC Managed Care – PPO

## 2011-12-10 DIAGNOSIS — C569 Malignant neoplasm of unspecified ovary: Secondary | ICD-10-CM

## 2011-12-10 DIAGNOSIS — I2699 Other pulmonary embolism without acute cor pulmonale: Secondary | ICD-10-CM

## 2011-12-10 NOTE — Progress Notes (Unsigned)
Sarah Walker presented for labwork. Labs per MD order drawn via Peripheral Line 23 gauge needle inserted in right forearm  Good blood return present. Procedure without incident.  Needle removed intact. Patient tolerated procedure well.

## 2011-12-13 ENCOUNTER — Other Ambulatory Visit (HOSPITAL_COMMUNITY): Payer: Self-pay | Admitting: *Deleted

## 2011-12-13 ENCOUNTER — Encounter (HOSPITAL_BASED_OUTPATIENT_CLINIC_OR_DEPARTMENT_OTHER): Payer: BC Managed Care – PPO | Admitting: Oncology

## 2011-12-13 VITALS — BP 140/91 | HR 65 | Temp 97.6°F | Resp 20 | Wt 202.0 lb

## 2011-12-13 DIAGNOSIS — Z803 Family history of malignant neoplasm of breast: Secondary | ICD-10-CM

## 2011-12-13 DIAGNOSIS — G620 Drug-induced polyneuropathy: Secondary | ICD-10-CM

## 2011-12-13 DIAGNOSIS — Z86711 Personal history of pulmonary embolism: Secondary | ICD-10-CM

## 2011-12-13 DIAGNOSIS — C569 Malignant neoplasm of unspecified ovary: Secondary | ICD-10-CM

## 2011-12-13 NOTE — Progress Notes (Signed)
Problem #1 stage IA papillary serous cystadenocarcinoma [place] status post surgery followed by adjuvant chemotherapy consisting of 6 cycles carboplatin and paclitaxel. Interestingly her cancer marker remains mildly elevated.  Problem #2 thrombocytopenia which delayed her chemotherapy one week and that has resolved Problem #3 pulmonary emboli presentation status post IVC filter placement in May 2013 which will be removed this month and the interventional radiologist at [facility] will transition her. Problem #4 grade 1 peripheral neuropathy secondary to paclitaxel Problem #5 obesity weighing 202 pounds on a 5 foot frame  She feels good, energy starting to come back. She is wondering when her hair will grow back I suspect it will start quite nicely in January. She's had her IVC filter removed in the near future. She did bring up the fact that one of her cousins who is my patient and her cousins mother both had breast cancer. She was wondering about BRCA1 and BRCA2 testing. I did discuss her case with our geneticist and the patient will call for appointment. Other than that her oncology review systems is negative. I do believe her Coumadin should be continued for total 12 months. Her vital signs are otherwise stable. She has no lymphadenopathy. Lungs clear. Heart shows a regular rhythm and rate. Port-A-Cath is intact. Abdomen is obese but without organomegaly or ascites. There is no tenderness. Shows no leg edema. She has no arm edema. She is alert and oriented.  We will see her in 3 months and she will continue mammography yearly which is due in January. I want to check her cancer marker once a month for the next 6 months and consider repeating her CT scan in 6 months or sooner based upon her tests

## 2011-12-13 NOTE — Patient Instructions (Addendum)
[facility] Specialty Clinic  Discharge Instructions  RECOMMENDATIONS MADE BY THE CONSULTANT AND ANY TEST RESULTS WILL BE SENT TO YOUR REFERRING DOCTOR.   EXAM FINDINGS BY MD TODAY AND SIGNS AND SYMPTOMS TO REPORT TO CLINIC OR PRIMARY MD: Exam today per Dr. Mariel Sleet.  No procrit shot today. We will recheck in one month with other labs INSTRUCTIONS GIVEN AND DISCUSSED: You should be transitioned with lovenox coverage at time of surgery.  SPECIAL INSTRUCTIONS/FOLLOW-UP: Labs monthly and see Dr. Mariel Sleet in 3 months.   I acknowledge that I have been informed and understand all the instructions given to me and received a copy. I do not have any more questions at this time, but understand that I may call the Specialty Clinic at [facility] at [phone] during business hours should I have any further questions or need assistance in obtaining follow-up care.    __________________________________________  _____________  __________ Signature of Patient or Authorized Representative            Date                   Time    __________________________________________ Nurse's Signature

## 2011-12-14 ENCOUNTER — Encounter (HOSPITAL_BASED_OUTPATIENT_CLINIC_OR_DEPARTMENT_OTHER): Payer: BC Managed Care – PPO

## 2011-12-14 DIAGNOSIS — C569 Malignant neoplasm of unspecified ovary: Secondary | ICD-10-CM

## 2011-12-14 DIAGNOSIS — Z0000 Encounter for general adult medical examination without abnormal findings: Secondary | ICD-10-CM

## 2011-12-14 DIAGNOSIS — Z452 Encounter for adjustment and management of vascular access device: Secondary | ICD-10-CM

## 2011-12-14 MED ORDER — INFLUENZA VIRUS VACC SPLIT PF IM SUSP: 0.5000 mL | Freq: Once | INTRAMUSCULAR | Status: DC

## 2011-12-14 MED ORDER — SODIUM CHLORIDE 0.9 % IJ SOLN
INTRAMUSCULAR | Status: AC
  Filled 2011-12-14: qty 10

## 2011-12-14 MED ORDER — INFLUENZA VIRUS VACC SPLIT PF IM SUSP
INTRAMUSCULAR | Status: AC
  Filled 2011-12-14: qty 0.5

## 2011-12-14 MED ORDER — HEPARIN SOD (PORK) LOCK FLUSH 100 UNIT/ML IV SOLN
500.0000 [IU] | Freq: Once | INTRAVENOUS | Status: AC
  Administered 2011-12-14: 500 [IU] via INTRAVENOUS
  Filled 2011-12-14: qty 5

## 2011-12-14 MED ORDER — SODIUM CHLORIDE 0.9 % IJ SOLN
10.0000 mL | INTRAMUSCULAR | Status: DC | PRN
  Administered 2011-12-14: 10 mL via INTRAVENOUS
  Filled 2011-12-14: qty 10

## 2011-12-14 MED ORDER — HEPARIN SOD (PORK) LOCK FLUSH 100 UNIT/ML IV SOLN
INTRAVENOUS | Status: AC
  Filled 2011-12-14: qty 5

## 2011-12-14 NOTE — Progress Notes (Signed)
Sarah Walker presented for Portacath access and flush.  Proper placement of portacath confirmed by CXR.  Portacath located right chest wall accessed with  H 20 needle.  Good blood return present. Portacath flushed with 20ml NS and 500U/31ml Heparin and needle removed intact.  Procedure tolerated well and without incident.  Pt requested flu vaccine, however, she reported history of allergic reaction (redness and swelling) to the same arm for which she had received the vaccine x 3-4 yrs ago.  Sarah Walker also reports h/o allergy to chicken with rash, redness, swelling symptoms when receiving chicken/chicken by-products; she states she can eat chicken/egg now w/o "any problems."

## 2011-12-27 ENCOUNTER — Telehealth (HOSPITAL_COMMUNITY): Payer: Self-pay | Admitting: *Deleted

## 2011-12-27 ENCOUNTER — Encounter (HOSPITAL_BASED_OUTPATIENT_CLINIC_OR_DEPARTMENT_OTHER): Payer: BC Managed Care – PPO

## 2011-12-27 ENCOUNTER — Other Ambulatory Visit (HOSPITAL_COMMUNITY): Payer: Self-pay | Admitting: Oncology

## 2011-12-27 DIAGNOSIS — I2699 Other pulmonary embolism without acute cor pulmonale: Secondary | ICD-10-CM

## 2011-12-27 DIAGNOSIS — C569 Malignant neoplasm of unspecified ovary: Secondary | ICD-10-CM

## 2011-12-27 LAB — PROTIME-INR: INR: 1.47 (ref 0.00–1.49)

## 2011-12-27 NOTE — Telephone Encounter (Signed)
Bactrim DS called to [facility] Aid # [phone] and #3 diflucan called in as well.

## 2011-12-27 NOTE — Progress Notes (Signed)
Sarah Walker presented for labwork. Labs per MD order drawn via Peripheral Line 25 gauge needle inserted in rt ac.  Good blood return present. Procedure without incident.  Needle removed intact. Patient tolerated procedure well.   Pt c/o yellow drainage, sinus congestion and sore throat. Had Augmentin 875-125 on 11/13 with same symptoms and worked well for her. If called in she will need diflucan as before also. Temp 98.2 today.

## 2011-12-27 NOTE — Telephone Encounter (Signed)
Pt c/o yellow drainage, sinus congestion and sore throat. Had Augmentin 875-125 on 11/13 with same symptoms and worked well for her. If called in she will need diflucan as before also. Temp 98.2 today.

## 2011-12-27 NOTE — Telephone Encounter (Signed)
Per Mr. Lippold, their understanding was for [place] to manage coumadin from this point on.

## 2011-12-31 ENCOUNTER — Ambulatory Visit (HOSPITAL_COMMUNITY): Payer: Self-pay | Admitting: Oncology

## 2011-12-31 ENCOUNTER — Encounter (HOSPITAL_BASED_OUTPATIENT_CLINIC_OR_DEPARTMENT_OTHER): Payer: BC Managed Care – PPO

## 2011-12-31 ENCOUNTER — Other Ambulatory Visit (HOSPITAL_COMMUNITY): Payer: Self-pay | Admitting: Oncology

## 2011-12-31 DIAGNOSIS — I2699 Other pulmonary embolism without acute cor pulmonale: Secondary | ICD-10-CM

## 2011-12-31 LAB — PROTIME-INR: INR: 2.58 — ABNORMAL HIGH (ref 0.00–1.49)

## 2011-12-31 NOTE — Progress Notes (Signed)
Labs drawn today for pt 

## 2012-01-04 ENCOUNTER — Encounter (HOSPITAL_BASED_OUTPATIENT_CLINIC_OR_DEPARTMENT_OTHER): Payer: BC Managed Care – PPO

## 2012-01-04 DIAGNOSIS — I2699 Other pulmonary embolism without acute cor pulmonale: Secondary | ICD-10-CM

## 2012-01-04 DIAGNOSIS — C569 Malignant neoplasm of unspecified ovary: Secondary | ICD-10-CM

## 2012-01-04 LAB — PROTIME-INR
INR: 2.96 — ABNORMAL HIGH (ref 0.00–1.49)
Prothrombin Time: 29.3 seconds — ABNORMAL HIGH (ref 11.6–15.2)

## 2012-01-04 NOTE — Progress Notes (Signed)
Labs drawn today for pt,ca125

## 2012-01-04 NOTE — Addendum Note (Signed)
Addended by: Evelena Leyden on: 01/04/2012 06:07 PM   Modules accepted: Orders

## 2012-01-07 ENCOUNTER — Other Ambulatory Visit (HOSPITAL_COMMUNITY): Payer: BC Managed Care – PPO

## 2012-01-11 ENCOUNTER — Encounter (HOSPITAL_COMMUNITY): Payer: BC Managed Care – PPO | Attending: Oncology

## 2012-01-11 DIAGNOSIS — C569 Malignant neoplasm of unspecified ovary: Secondary | ICD-10-CM

## 2012-01-11 DIAGNOSIS — T451X5A Adverse effect of antineoplastic and immunosuppressive drugs, initial encounter: Secondary | ICD-10-CM | POA: Insufficient documentation

## 2012-01-11 DIAGNOSIS — I2699 Other pulmonary embolism without acute cor pulmonale: Secondary | ICD-10-CM | POA: Insufficient documentation

## 2012-01-11 LAB — CBC WITH DIFFERENTIAL/PLATELET
Basophils Relative: 0 % (ref 0–1)
Eosinophils Absolute: 0.2 10*3/uL (ref 0.0–0.7)
Eosinophils Relative: 6 % — ABNORMAL HIGH (ref 0–5)
HCT: 36.9 % (ref 36.0–46.0)
Hemoglobin: 11.9 g/dL — ABNORMAL LOW (ref 12.0–15.0)
Lymphs Abs: 1.3 10*3/uL (ref 0.7–4.0)
MCH: 33.1 pg (ref 26.0–34.0)
MCHC: 32.2 g/dL (ref 30.0–36.0)
MCV: 102.8 fL — ABNORMAL HIGH (ref 78.0–100.0)
Monocytes Absolute: 0.3 10*3/uL (ref 0.1–1.0)
Monocytes Relative: 9 % (ref 3–12)
Neutrophils Relative %: 51 % (ref 43–77)

## 2012-01-11 LAB — PROTIME-INR: INR: 2.75 — ABNORMAL HIGH (ref 0.00–1.49)

## 2012-01-11 NOTE — Progress Notes (Signed)
Sarah Walker presented for labwork. Labs per MD order drawn via Peripheral Line 25 gauge needle inserted in rt ac.  Good blood return present. Procedure without incident.  Needle removed intact. Patient tolerated procedure well. Coumadin dose is 2.5/2.5/5

## 2012-01-19 ENCOUNTER — Other Ambulatory Visit (HOSPITAL_COMMUNITY): Payer: Self-pay | Admitting: Oncology

## 2012-01-19 DIAGNOSIS — M79673 Pain in unspecified foot: Secondary | ICD-10-CM

## 2012-01-25 ENCOUNTER — Encounter (HOSPITAL_COMMUNITY): Payer: BC Managed Care – PPO

## 2012-01-25 ENCOUNTER — Other Ambulatory Visit (HOSPITAL_COMMUNITY): Payer: Self-pay | Admitting: Oncology

## 2012-01-25 ENCOUNTER — Ambulatory Visit (HOSPITAL_COMMUNITY)
Admission: RE | Admit: 2012-01-25 | Discharge: 2012-01-25 | Disposition: A | Discharge status: Home / Self Care | Payer: BC Managed Care – PPO | Source: Ambulatory Visit | Attending: Oncology | Admitting: Oncology

## 2012-01-25 ENCOUNTER — Encounter (HOSPITAL_BASED_OUTPATIENT_CLINIC_OR_DEPARTMENT_OTHER): Payer: BC Managed Care – PPO

## 2012-01-25 DIAGNOSIS — M79673 Pain in unspecified foot: Secondary | ICD-10-CM

## 2012-01-25 DIAGNOSIS — M7730 Calcaneal spur, unspecified foot: Secondary | ICD-10-CM | POA: Insufficient documentation

## 2012-01-25 DIAGNOSIS — I2699 Other pulmonary embolism without acute cor pulmonale: Secondary | ICD-10-CM

## 2012-01-25 DIAGNOSIS — Z139 Encounter for screening, unspecified: Secondary | ICD-10-CM

## 2012-01-25 DIAGNOSIS — M79609 Pain in unspecified limb: Secondary | ICD-10-CM | POA: Insufficient documentation

## 2012-01-25 DIAGNOSIS — C569 Malignant neoplasm of unspecified ovary: Secondary | ICD-10-CM

## 2012-01-25 MED ORDER — HEPARIN SOD (PORK) LOCK FLUSH 100 UNIT/ML IV SOLN
INTRAVENOUS | Status: AC
  Filled 2012-01-25: qty 5

## 2012-01-25 MED ORDER — SODIUM CHLORIDE 0.9 % IJ SOLN
10.0000 mL | INTRAMUSCULAR | Status: DC | PRN
  Administered 2012-01-25: 10 mL via INTRAVENOUS
  Filled 2012-01-25: qty 10

## 2012-01-25 MED ORDER — HEPARIN SOD (PORK) LOCK FLUSH 100 UNIT/ML IV SOLN
500.0000 [IU] | Freq: Once | INTRAVENOUS | Status: AC
  Administered 2012-01-25: 500 [IU] via INTRAVENOUS
  Filled 2012-01-25: qty 5

## 2012-01-25 NOTE — Progress Notes (Signed)
Sarah Walker presented for Portacath access and flush. Proper placement of portacath confirmed by CXR. Portacath located right chest wall accessed with  H 20 needle. Good blood return present. Portacath flushed with 20ml NS and 500U/5ml Heparin and needle removed intact. Procedure without incident. Patient tolerated procedure well.   

## 2012-02-01 ENCOUNTER — Ambulatory Visit (HOSPITAL_COMMUNITY): Payer: BC Managed Care – PPO

## 2012-02-03 ENCOUNTER — Other Ambulatory Visit (HOSPITAL_COMMUNITY): Payer: BC Managed Care – PPO

## 2012-02-08 ENCOUNTER — Ambulatory Visit (HOSPITAL_COMMUNITY)
Admission: RE | Admit: 2012-02-08 | Discharge: 2012-02-08 | Disposition: A | Discharge status: Home / Self Care | Payer: BC Managed Care – PPO | Source: Ambulatory Visit | Attending: Oncology | Admitting: Oncology

## 2012-02-08 ENCOUNTER — Other Ambulatory Visit (HOSPITAL_COMMUNITY): Payer: BC Managed Care – PPO

## 2012-02-08 DIAGNOSIS — Z139 Encounter for screening, unspecified: Secondary | ICD-10-CM

## 2012-02-08 DIAGNOSIS — Z1231 Encounter for screening mammogram for malignant neoplasm of breast: Secondary | ICD-10-CM | POA: Insufficient documentation

## 2012-02-10 ENCOUNTER — Encounter (HOSPITAL_COMMUNITY): Payer: BC Managed Care – PPO | Attending: Oncology

## 2012-02-10 DIAGNOSIS — C569 Malignant neoplasm of unspecified ovary: Secondary | ICD-10-CM

## 2012-02-10 DIAGNOSIS — T451X5A Adverse effect of antineoplastic and immunosuppressive drugs, initial encounter: Secondary | ICD-10-CM | POA: Insufficient documentation

## 2012-02-10 DIAGNOSIS — I2699 Other pulmonary embolism without acute cor pulmonale: Secondary | ICD-10-CM

## 2012-02-10 LAB — CBC
Hemoglobin: 12.1 g/dL (ref 12.0–15.0)
MCH: 33.7 pg (ref 26.0–34.0)
MCV: 98.3 fL (ref 78.0–100.0)
RBC: 3.59 MIL/uL — ABNORMAL LOW (ref 3.87–5.11)

## 2012-02-10 LAB — PROTIME-INR: Prothrombin Time: 24.1 seconds — ABNORMAL HIGH (ref 11.6–15.2)

## 2012-02-10 NOTE — Progress Notes (Signed)
Cont same dose of coumadin 2.5/2.5/4. INR on 2/20 @ 8:30. Pt verbalized understanding.

## 2012-02-10 NOTE — Progress Notes (Signed)
Labs drawn today for pt,ca125,cbc

## 2012-02-24 ENCOUNTER — Encounter (HOSPITAL_COMMUNITY): Payer: BC Managed Care – PPO

## 2012-02-24 DIAGNOSIS — I2699 Other pulmonary embolism without acute cor pulmonale: Secondary | ICD-10-CM

## 2012-02-24 LAB — PROTIME-INR: Prothrombin Time: 23.7 seconds — ABNORMAL HIGH (ref 11.6–15.2)

## 2012-02-24 NOTE — Progress Notes (Signed)
Labs drawn today for pt 

## 2012-03-02 ENCOUNTER — Other Ambulatory Visit (HOSPITAL_COMMUNITY): Payer: BC Managed Care – PPO

## 2012-03-07 ENCOUNTER — Encounter (HOSPITAL_COMMUNITY): Payer: BC Managed Care – PPO

## 2012-03-09 ENCOUNTER — Encounter (HOSPITAL_COMMUNITY): Payer: BC Managed Care – PPO | Attending: Oncology

## 2012-03-09 DIAGNOSIS — T451X5A Adverse effect of antineoplastic and immunosuppressive drugs, initial encounter: Secondary | ICD-10-CM | POA: Insufficient documentation

## 2012-03-09 DIAGNOSIS — C569 Malignant neoplasm of unspecified ovary: Secondary | ICD-10-CM | POA: Insufficient documentation

## 2012-03-09 DIAGNOSIS — I10 Essential (primary) hypertension: Secondary | ICD-10-CM

## 2012-03-09 DIAGNOSIS — I2699 Other pulmonary embolism without acute cor pulmonale: Secondary | ICD-10-CM | POA: Insufficient documentation

## 2012-03-09 LAB — PROTIME-INR
INR: 1.94 — ABNORMAL HIGH (ref 0.00–1.49)
Prothrombin Time: 21.4 seconds — ABNORMAL HIGH (ref 11.6–15.2)

## 2012-03-09 MED ORDER — HEPARIN SOD (PORK) LOCK FLUSH 100 UNIT/ML IV SOLN
INTRAVENOUS | Status: AC
  Filled 2012-03-09: qty 5

## 2012-03-09 NOTE — Progress Notes (Unsigned)
Sarah Walker presented for Portacath access and flush. Proper placement of portacath confirmed by CXR. Portacath located rt chest wall accessed with  H 20 needle. Good blood return present. Portacath flushed with 20ml NS and 500U/76ml Heparin and needle removed intact. Procedure without incident. Patient tolerated procedure well.  Patient taking Coumadin 2.5/2.5/4

## 2012-03-10 ENCOUNTER — Other Ambulatory Visit (HOSPITAL_COMMUNITY): Payer: BC Managed Care – PPO

## 2012-03-12 NOTE — Progress Notes (Addendum)
Sarah Ruths, MD [address] [place] [address]  Ovarian cancer, unspecified laterality - Plan: CT Abdomen Pelvis W Contrast, warfarin (COUMADIN) 4 MG tablet, Fexofenadine-Pseudoephedrine (ALLEGRA-D 12 HOUR PO), Basic metabolic panel  CURRENT THERAPY: Observation with CA 125  INTERVAL HISTORY: Sarah Walker 62 y.o. female returns for  regular  visit for followup of Stage IA papillary serous cystadenocarcinoma status post surgery followed by adjuvant chemotherapy consisting of 6 cycles carboplatin and paclitaxel (07/15/2011- 11/04/2011). Interestingly her cancer marker remained mildly elevated for some time, but is now WNL.  Sarah Walker is here for followup. We spent some time reviewing her laboratory work.  Her INR was 1.94 so we'll keep her on the same dose and recheck her Coumadin level next week. Her cancer marker is within normal limits at 13.8. We've been watching this closely since following the completion of therapy her cancer marker has been mildly elevated. Most recently however, it has been within normal limits. We'll continue to monitor her cancer marker, namely her CA 125, every 4 weeks. We will restage her with a CT scan of abdomen and pelvis mid-May 2014. She is agreeable to this plan.  Sarah Walker questions how long she'll in require Coumadin. She was diagnosed with a pulmonary embolism in May 2013 which I believe led to her diagnosis. She had an IVC filter placed at that time as well. She completed therapy in October of 2013. Her IVC filter was removed on 12/22/2011 at [facility]. I suspect she will require anticoagulation from the one year of diagnosis which would be May of 2014, but I will defer this to Dr. Mariel Sleet.  She has been seeing Dr. Hilda Lias for an orthopedic issue. She reports to him that her right elbow was discomforting her and he blamed it on chemotherapy. I rectified this with the patient and told her that her right elbow discomfort has  nothing to do with chemotherapy. I believe we'll have to keep an eye on this discomfort. I do not think anything serious and will likely resolve on its own with symptomatically management. I've encouraged her to utilize ice/heat plus or minus NSAIDs for pain control. I suspect this is muscular in nature since it is worsened with supination of forearm.  Sarah Walker reports that she's never had a screening colonoscopy and therefore she will require a referral to Dr. Karilyn Cota for this health maintenance. I sent a medication to her schedule her for this referral.  The patient has a few questions regarding her weight. He weighs 208 pounds on a 5 foot 1 inch frame. She can certainly use to lose weight for her future health. She reports that her primary care physician and reports that her cholesterol is elevated. I provided the patient with education regarding increased exercising which will be healthy for her. I've recommended low impact exercising such as yoga, walking, Pool exercises, and at home exercise that she can pause when needed. We also discussed healthy eating habits.  Sarah Walker requested a shingles vaccination. This would be appropriate for her. I contacted our pharmacy here at the [facility] and we do not have that available but we are able to ordered if needed. The patient will contact her local pharmacy and/or her primary care physician to see if she can have it performed there. She will contact us if she needs [place] to order it and administer her the injection.  Oncologically, the patient denies any complaints and ROS questioning is negative.  Past Medical  History  Diagnosis Date  . Allergic rhinitis   . Sinusitis   . Hypertension   . Ovarian cancer   . Pulmonary embolism   . Peripheral neuropathy 08/26/2011    Chemotherapy-induced  . Anemia associated with chemotherapy 09/16/2011  . Bone spur of other site     left foot    has Ovarian cancer; Hypertension; H/O hysterectomy with  oophorectomy; Pulmonary embolism; Peripheral neuropathy; and Anemia associated with chemotherapy on her problem list.     is allergic to codeine and chicken allergy.  Sarah Walker had no medications administered during this visit.  Past Surgical History  Procedure Laterality Date  . Tonsillectomy      age 88's  . Cholecystectomy  1990  . Abdominal hysterectomy  05/25/2011    [facility] and cancer staging  . Tubal ligation  1981  . Portacath placement  06/16/11    Done at [facility]  . Ivc filter  05/24/11    Denies any headaches, dizziness, double vision, fevers, chills, night sweats, nausea, vomiting, diarrhea, constipation, chest pain, heart palpitations, shortness of breath, blood in stool, black tarry stool, urinary pain, urinary burning, urinary frequency, hematuria.   PHYSICAL EXAMINATION  ECOG PERFORMANCE STATUS: 0 - Asymptomatic  Filed Vitals:   03/13/12 1400  BP: 126/73  Pulse: 67  Temp: 98 F (36.7 C)  Resp: 18    GENERAL:alert, no distress, well nourished, well developed, comfortable, cooperative, obese and smiling SKIN: skin color, texture, turgor are normal, no rashes or significant lesions HEAD: Normocephalic, No masses, lesions, tenderness or abnormalities EYES: normal, Conjunctiva are pink and non-injected EARS: External ears normal OROPHARYNX:lips, buccal mucosa, and tongue normal and mucous membranes are moist  NECK: supple, no adenopathy, thyroid normal size, non-tender, without nodularity, no stridor, non-tender, trachea midline LYMPH:  no palpable lymphadenopathy, no hepatosplenomegaly BREAST:not examined LUNGS: clear to auscultation and percussion HEART: regular rate & rhythm, no murmurs, no gallops, S1 normal and S2 normal ABDOMEN:abdomen soft, non-tender, obese, normal bowel sounds, no masses or organomegaly and no hepatosplenomegaly BACK: Back symmetric, no curvature., No CVA tenderness EXTREMITIES:less then 2 second capillary refill, no joint  deformities, effusion, or inflammation, no edema, no skin discoloration, no clubbing, no cyanosis  NEURO: alert & oriented x 3 with fluent speech, no focal motor/sensory deficits, gait normal    LABORATORY DATA: CBC    Component Value Date/Time   WBC 4.3 02/10/2012 0900   RBC 3.59* 02/10/2012 0900   HGB 12.1 02/10/2012 0900   HCT 35.3* 02/10/2012 0900   PLT 205 02/10/2012 0900   MCV 98.3 02/10/2012 0900   MCH 33.7 02/10/2012 0900   MCHC 34.3 02/10/2012 0900   RDW 13.9 02/10/2012 0900   LYMPHSABS 1.3 01/11/2012 0820   MONOABS 0.3 01/11/2012 0820   EOSABS 0.2 01/11/2012 0820   BASOSABS 0.0 01/11/2012 0820      Chemistry      Component Value Date/Time   NA 138 11/19/2011 0858   K 3.8 11/19/2011 0858   CL 100 11/19/2011 0858   CO2 26 11/19/2011 0858   BUN 19 11/19/2011 0858   CREATININE 0.64 11/19/2011 0858      Component Value Date/Time   CALCIUM 9.2 11/19/2011 0858   ALKPHOS 59 11/19/2011 0858   AST 14 11/19/2011 0858   ALT 18 11/19/2011 0858   BILITOT 0.3 11/19/2011 0858     Lab Results  Component Value Date   CA125 13.8 03/09/2012   Lab Results  Component Value Date   INR  1.94* 03/09/2012   INR 2.23* 02/24/2012   INR 2.28* 02/10/2012     RADIOGRAPHIC STUDIES:  02/08/2012  *RADIOLOGY REPORT*  Clinical Data: Screening.  DIGITAL BILATERAL SCREENING MAMMOGRAM WITH CAD  Comparison: Previous exams.  FINDINGS:  ACR Breast Density Category 2: There is a scattered fibroglandular  pattern.  No suspicious masses, architectural distortion, or calcifications  are present.  Images were processed with CAD.  IMPRESSION:  No mammographic evidence of malignancy.  A result letter of this screening mammogram will be mailed directly  to the patient.  RECOMMENDATION:  Screening mammogram in one year. (Code:SM-B-01Y)  BI-RADS CATEGORY 1: Negative.  Original Report Authenticated By: Esperanza Heir, M.D.     ASSESSMENT:  1. Stage IA papillary serous cystadenocarcinoma status post surgery followed  by adjuvant chemotherapy consisting of 6 cycles carboplatin and paclitaxel (07/15/2011- 11/04/2011). Interestingly her cancer marker remained mildly elevated for some time, but is now WNL.  2. Thrombocytopenia which delayed her chemotherapy one week and that has resolved  3. Pulmonary emboli presentation status post IVC filter placement in May 2013 which was removed on 12/22/2011.  4. Grade 1 peripheral neuropathy secondary to paclitaxel  5. Obesity weighing 202 pounds on a 5 foot frame   PLAN:  1. I personally reviewed and went over laboratory results with the patient. 2. I personally reviewed and went over radiographic studies with the patient. 3. Screening mammogram will be due again in Feb 2015 4. CA 125 every 4 weeks. 5. CT abd/pelvis with contrast in May 2014. 6. Referral to Dr. Karilyn Cota for screening colonoscopy. 7. BMET added to next week's PT/INR 8. Patient education regarding weight loss and healthy eating habits.  9. Return in May after CT scan.   All questions were answered. The patient knows to call the clinic with any problems, questions or concerns. We can certainly see the patient much sooner if necessary.  Patient and plan will be discussed with Dr. Mariel Sleet within the next 24 hours.   KEFALAS,THOMAS   ADDENDUM:  Discussed anticoagulation with Dr. Mariel Sleet.  Sarah Walker will need to continue Coumadin through May 2014 and then D/C after completing 1 year worth of Coumadin.  KEFALAS,THOMAS

## 2012-03-13 ENCOUNTER — Encounter (HOSPITAL_BASED_OUTPATIENT_CLINIC_OR_DEPARTMENT_OTHER): Payer: BC Managed Care – PPO | Admitting: Oncology

## 2012-03-13 ENCOUNTER — Encounter (HOSPITAL_COMMUNITY): Payer: Self-pay | Admitting: Oncology

## 2012-03-13 VITALS — BP 126/73 | HR 67 | Temp 98.0°F | Resp 18 | Wt 208.2 lb

## 2012-03-13 DIAGNOSIS — Z7901 Long term (current) use of anticoagulants: Secondary | ICD-10-CM

## 2012-03-13 DIAGNOSIS — Z86711 Personal history of pulmonary embolism: Secondary | ICD-10-CM

## 2012-03-13 DIAGNOSIS — Z8543 Personal history of malignant neoplasm of ovary: Secondary | ICD-10-CM

## 2012-03-13 DIAGNOSIS — G620 Drug-induced polyneuropathy: Secondary | ICD-10-CM

## 2012-03-13 DIAGNOSIS — C569 Malignant neoplasm of unspecified ovary: Secondary | ICD-10-CM

## 2012-03-13 NOTE — Patient Instructions (Addendum)
[facility] Discharge Instructions  RECOMMENDATIONS MADE BY THE CONSULTANT AND ANY TEST RESULTS WILL BE SENT TO YOUR REFERRING PHYSICIAN.  EXAM FINDINGS BY THE PHYSICIAN TODAY AND SIGNS OR SYMPTOMS TO REPORT TO CLINIC OR PRIMARY PHYSICIAN: Exam and discussion by PA.  You are doing well your blood work is stable.  We will make a referral to Dr. Karilyn Cota for screening colonoscopy. Will check your CA 125 every 4 weeks, and do CT scans of your abdomen and pelvis in May.  MEDICATIONS PRESCRIBED:  none  INSTRUCTIONS GIVEN AND DISCUSSED: Report abdominal discomfort, swelling in abdomen, shortness of breath or other problems.  SPECIAL INSTRUCTIONS/FOLLOW-UP: CA 125 every 4 weeks, Port flush every 6 weeks, CT of Abdomen and pelvis in May and to be seen in follow-up after scans in May.  Thank you for choosing [facility] to provide your oncology and hematology care.  To afford each patient quality time with our providers, please arrive at least 15 minutes before your scheduled appointment time.  With your help, our goal is to use those 15 minutes to complete the necessary work-up to ensure our physicians have the information they need to help with your evaluation and healthcare recommendations.    Effective January 1st, 2014, we ask that you re-schedule your appointment with our physicians should you arrive 10 or more minutes late for your appointment.  We strive to give you quality time with our providers, and arriving late affects you and other patients whose appointments are after yours.    Again, thank you for choosing [facility].  Our hope is that these requests will decrease the amount of time that you wait before being seen by our physicians.       _____________________________________________________________  Should you have questions after your visit to [facility], please contact our office at [phone] between the hours of 8:30  a.m. and 5:00 p.m.  Voicemails left after 4:30 p.m. will not be returned until the following business day.  For prescription refill requests, have your pharmacy contact our office with your prescription refill request.

## 2012-03-14 ENCOUNTER — Telehealth (HOSPITAL_COMMUNITY): Payer: Self-pay

## 2012-03-14 NOTE — Telephone Encounter (Signed)
Message copied by Evelena Leyden on Tue Mar 14, 2012  6:15 PM ------      Message from: Ellouise Newer      Created: Tue Mar 14, 2012  6:11 PM       Someone may have already called her, but Dr. Mariel Sleet wants to stop Coumadin after her CT scan in May.  She should continue with Coumadin and we will talk about a D/C date when she returns after CT scan ------

## 2012-03-14 NOTE — Telephone Encounter (Signed)
Patient notified that she will need to continue her coumadin until after scans are done in May and that when seen by MD after scans he will let her know when to stop it.  Verbalized understanding of instructions.

## 2012-03-16 ENCOUNTER — Encounter (INDEPENDENT_AMBULATORY_CARE_PROVIDER_SITE_OTHER): Payer: Self-pay | Admitting: *Deleted

## 2012-03-20 ENCOUNTER — Other Ambulatory Visit (HOSPITAL_COMMUNITY): Payer: BC Managed Care – PPO

## 2012-03-22 ENCOUNTER — Other Ambulatory Visit (HOSPITAL_COMMUNITY): Payer: Self-pay | Admitting: Oncology

## 2012-03-22 ENCOUNTER — Encounter (HOSPITAL_COMMUNITY): Payer: BC Managed Care – PPO

## 2012-03-22 DIAGNOSIS — C569 Malignant neoplasm of unspecified ovary: Secondary | ICD-10-CM

## 2012-03-22 DIAGNOSIS — I2699 Other pulmonary embolism without acute cor pulmonale: Secondary | ICD-10-CM

## 2012-03-22 LAB — BASIC METABOLIC PANEL
CO2: 30 mEq/L (ref 19–32)
Chloride: 105 mEq/L (ref 96–112)
GFR calc non Af Amer: 88 mL/min — ABNORMAL LOW (ref >90)
Glucose, Bld: 99 mg/dL (ref 70–99)
Potassium: 4.7 mEq/L (ref 3.5–5.1)
Sodium: 142 mEq/L (ref 135–145)

## 2012-03-22 LAB — CBC
HCT: 36 % (ref 36.0–46.0)
Hemoglobin: 12 g/dL (ref 12.0–15.0)
MCH: 32.3 pg (ref 26.0–34.0)
RBC: 3.71 MIL/uL — ABNORMAL LOW (ref 3.87–5.11)

## 2012-03-22 NOTE — Progress Notes (Signed)
Labs drawn today for cbc,pt,bmp

## 2012-03-22 NOTE — Progress Notes (Signed)
Patient notified to change coumadin to 4/4/2.5  Inr on 3/28

## 2012-03-28 ENCOUNTER — Other Ambulatory Visit (HOSPITAL_COMMUNITY): Payer: Self-pay | Admitting: Oncology

## 2012-03-31 ENCOUNTER — Other Ambulatory Visit (HOSPITAL_COMMUNITY): Payer: Self-pay | Admitting: Oncology

## 2012-03-31 ENCOUNTER — Encounter (HOSPITAL_BASED_OUTPATIENT_CLINIC_OR_DEPARTMENT_OTHER): Payer: BC Managed Care – PPO

## 2012-03-31 DIAGNOSIS — C569 Malignant neoplasm of unspecified ovary: Secondary | ICD-10-CM

## 2012-03-31 DIAGNOSIS — I2699 Other pulmonary embolism without acute cor pulmonale: Secondary | ICD-10-CM

## 2012-03-31 LAB — CBC
HCT: 36.5 % (ref 36.0–46.0)
Hemoglobin: 12.2 g/dL (ref 12.0–15.0)
MCH: 32.3 pg (ref 26.0–34.0)
RBC: 3.78 MIL/uL — ABNORMAL LOW (ref 3.87–5.11)

## 2012-03-31 LAB — PROTIME-INR: Prothrombin Time: 26.9 seconds — ABNORMAL HIGH (ref 11.6–15.2)

## 2012-03-31 NOTE — Progress Notes (Signed)
Spoke with husband and instructed that Chasta to continue coumadin same dosage and to return for next PT/INR on 04/05/12.  Verbalizes understanding.

## 2012-03-31 NOTE — Progress Notes (Signed)
Labs drawn today for cbc,pt 

## 2012-04-05 ENCOUNTER — Encounter: Payer: Self-pay | Admitting: Oncology

## 2012-04-05 ENCOUNTER — Encounter (HOSPITAL_COMMUNITY): Payer: BC Managed Care – PPO | Attending: Oncology

## 2012-04-05 DIAGNOSIS — I2699 Other pulmonary embolism without acute cor pulmonale: Secondary | ICD-10-CM

## 2012-04-05 DIAGNOSIS — T451X5A Adverse effect of antineoplastic and immunosuppressive drugs, initial encounter: Secondary | ICD-10-CM | POA: Insufficient documentation

## 2012-04-05 DIAGNOSIS — Z9889 Other specified postprocedural states: Secondary | ICD-10-CM | POA: Insufficient documentation

## 2012-04-05 DIAGNOSIS — C569 Malignant neoplasm of unspecified ovary: Secondary | ICD-10-CM

## 2012-04-05 LAB — PROTIME-INR
INR: 2.82 — ABNORMAL HIGH (ref 0.00–1.49)
Prothrombin Time: 28.2 seconds — ABNORMAL HIGH (ref 11.6–15.2)

## 2012-04-05 LAB — CA 125: CA 125: 10.6 U/mL (ref 0.0–30.2)

## 2012-04-05 NOTE — Progress Notes (Signed)
Labs drawn today for CA125, pt

## 2012-04-10 ENCOUNTER — Encounter (HOSPITAL_COMMUNITY): Payer: BC Managed Care – PPO

## 2012-04-19 ENCOUNTER — Encounter (HOSPITAL_BASED_OUTPATIENT_CLINIC_OR_DEPARTMENT_OTHER): Payer: BC Managed Care – PPO

## 2012-04-19 DIAGNOSIS — I2699 Other pulmonary embolism without acute cor pulmonale: Secondary | ICD-10-CM

## 2012-04-19 LAB — PROTIME-INR: INR: 2.26 — ABNORMAL HIGH (ref 0.00–1.49)

## 2012-04-19 NOTE — Progress Notes (Signed)
Labs drawn today for pt 

## 2012-04-27 ENCOUNTER — Encounter (HOSPITAL_BASED_OUTPATIENT_CLINIC_OR_DEPARTMENT_OTHER): Payer: BC Managed Care – PPO

## 2012-04-27 DIAGNOSIS — Z452 Encounter for adjustment and management of vascular access device: Secondary | ICD-10-CM

## 2012-04-27 DIAGNOSIS — C569 Malignant neoplasm of unspecified ovary: Secondary | ICD-10-CM

## 2012-04-27 DIAGNOSIS — Z95828 Presence of other vascular implants and grafts: Secondary | ICD-10-CM

## 2012-04-27 MED ORDER — HEPARIN SOD (PORK) LOCK FLUSH 100 UNIT/ML IV SOLN
INTRAVENOUS | Status: AC
  Filled 2012-04-27: qty 5

## 2012-04-27 MED ORDER — HEPARIN SOD (PORK) LOCK FLUSH 100 UNIT/ML IV SOLN
500.0000 [IU] | Freq: Once | INTRAVENOUS | Status: AC
  Administered 2012-04-27: 500 [IU] via INTRAVENOUS
  Filled 2012-04-27: qty 5

## 2012-04-27 MED ORDER — SODIUM CHLORIDE 0.9 % IJ SOLN
10.0000 mL | INTRAMUSCULAR | Status: DC | PRN
  Administered 2012-04-27: 10 mL via INTRAVENOUS
  Filled 2012-04-27: qty 10

## 2012-04-27 NOTE — Progress Notes (Signed)
Shaleen M Ernster presented for Portacath access and flush. Proper placement of portacath confirmed by CXR. Portacath located rt chest wall accessed with  H 20 needle. Good blood return present. Portacath flushed with 20ml NS and 500U/5ml Heparin and needle removed intact. Procedure without incident. Patient tolerated procedure well.   

## 2012-05-08 ENCOUNTER — Encounter (HOSPITAL_COMMUNITY): Payer: BC Managed Care – PPO | Attending: Oncology

## 2012-05-08 DIAGNOSIS — I2699 Other pulmonary embolism without acute cor pulmonale: Secondary | ICD-10-CM

## 2012-05-08 DIAGNOSIS — T451X5A Adverse effect of antineoplastic and immunosuppressive drugs, initial encounter: Secondary | ICD-10-CM | POA: Insufficient documentation

## 2012-05-08 DIAGNOSIS — C569 Malignant neoplasm of unspecified ovary: Secondary | ICD-10-CM | POA: Insufficient documentation

## 2012-05-08 LAB — PROTIME-INR: Prothrombin Time: 34.1 seconds — ABNORMAL HIGH (ref 11.6–15.2)

## 2012-05-08 LAB — CA 125: CA 125: 11.2 U/mL (ref 0.0–30.2)

## 2012-05-08 NOTE — Progress Notes (Signed)
Labs drawn today CA125,pt

## 2012-05-09 ENCOUNTER — Other Ambulatory Visit (HOSPITAL_COMMUNITY): Payer: BC Managed Care – PPO

## 2012-05-15 ENCOUNTER — Ambulatory Visit (HOSPITAL_COMMUNITY)
Admission: RE | Admit: 2012-05-15 | Discharge: 2012-05-15 | Disposition: A | Discharge status: Home / Self Care | Payer: BC Managed Care – PPO | Source: Ambulatory Visit | Attending: Oncology | Admitting: Oncology

## 2012-05-15 DIAGNOSIS — Z9221 Personal history of antineoplastic chemotherapy: Secondary | ICD-10-CM | POA: Insufficient documentation

## 2012-05-15 DIAGNOSIS — C569 Malignant neoplasm of unspecified ovary: Secondary | ICD-10-CM

## 2012-05-15 LAB — POCT I-STAT, CHEM 8
BUN: 24 mg/dL — ABNORMAL HIGH (ref 6–23)
Hemoglobin: 11.9 g/dL — ABNORMAL LOW (ref 12.0–15.0)
Sodium: 138 mEq/L (ref 135–145)
TCO2: 27 mmol/L (ref 0–100)

## 2012-05-15 MED ORDER — IOHEXOL 300 MG/ML  SOLN
100.0000 mL | Freq: Once | INTRAMUSCULAR | Status: AC | PRN
  Administered 2012-05-15: 100 mL via INTRAVENOUS

## 2012-05-15 NOTE — Progress Notes (Signed)
Blood sample obtained from right arm IV for Creatnine level.  

## 2012-05-16 ENCOUNTER — Encounter (HOSPITAL_COMMUNITY): Payer: Self-pay | Admitting: Oncology

## 2012-05-16 ENCOUNTER — Encounter (HOSPITAL_BASED_OUTPATIENT_CLINIC_OR_DEPARTMENT_OTHER): Payer: BC Managed Care – PPO | Admitting: Oncology

## 2012-05-16 VITALS — BP 135/87 | HR 70 | Temp 98.5°F | Resp 18 | Wt 215.0 lb

## 2012-05-16 DIAGNOSIS — C569 Malignant neoplasm of unspecified ovary: Secondary | ICD-10-CM

## 2012-05-16 DIAGNOSIS — I2699 Other pulmonary embolism without acute cor pulmonale: Secondary | ICD-10-CM

## 2012-05-16 NOTE — Patient Instructions (Addendum)
.  [facility] Discharge Instructions  RECOMMENDATIONS MADE BY THE CONSULTANT AND ANY TEST RESULTS WILL BE SENT TO YOUR REFERRING PHYSICIAN.  EXAM FINDINGS BY THE PHYSICIAN TODAY AND SIGNS OR SYMPTOMS TO REPORT TO CLINIC OR PRIMARY PHYSICIAN: Exam is great   MEDICATIONS PRESCRIBED:  You can stop coumadin now Let us know if you experience swelling in legs or shortness of breath or chest pain  INSTRUCTIONS GIVEN AND DISCUSSED:  We will do your port flush every 6 weeks and labs  SPECIAL INSTRUCTIONS/FOLLOW-UP: 3 months to see Elijah Birk  Thank you for choosing [facility] to provide your oncology and hematology care.  To afford each patient quality time with our providers, please arrive at least 15 minutes before your scheduled appointment time.  With your help, our goal is to use those 15 minutes to complete the necessary work-up to ensure our physicians have the information they need to help with your evaluation and healthcare recommendations.    Effective January 1st, 2014, we ask that you re-schedule your appointment with our physicians should you arrive 10 or more minutes late for your appointment.  We strive to give you quality time with our providers, and arriving late affects you and other patients whose appointments are after yours.    Again, thank you for choosing [facility].  Our hope is that these requests will decrease the amount of time that you wait before being seen by our physicians.       _____________________________________________________________  Should you have questions after your visit to [facility], please contact our office at [phone] between the hours of 8:30 a.m. and 5:00 p.m.  Voicemails left after 4:30 p.m. will not be returned until the following business day.  For prescription refill requests, have your pharmacy contact our office with your prescription refill request.

## 2012-05-16 NOTE — Progress Notes (Signed)
This lady had stage IA papillary serous cystadenocarcinoma status post surgical resection followed by adjuvant chemotherapy consisting of 6 cycles of carboplatin and paclitaxel from 07/15/2011 through 11/04/2011. At the time of presentation her cancer marker was elevated and remained elevated after surgery. It normalized with chemotherapy.  This lady's presentation in May of 2013 was complicated by pulmonary emboli. An IVC filter was placed initially prior surgery and was removed in December 2013. She has now been on anti-coagulation therapy since 05/10/2011. She has been very therapeutic the entire year essentially.  Her cancer marker remains well within the normal range and a CAT scan of the day shows no evidence for persistence of disease, ascites, etc.  Her physical exam today shows stable vital signs but still excessive weight. She is in no acute distress. She remains mildly pale. She has no obvious thyromegaly. Her lungs are clear to auscultation and percussion. Respiratory rate is 16 and unlabored at rest. Pulse right around 8286 and regular. She has no lymphadenopathy in any location heart shows a regular rhythm and rate without murmur rub or gallop. Port-A-Cath in the right upper chest walls intact. Abdomen is obese, nontender, nondistended, without obvious ascites or masses. She also has no inguinal nodes no leg edema.  She would like to switch her followup ovarian oncology care to Dr. Duard Brady or Dr. Loree Fee in [place]. I think we can arrange for consultation. In the meantime we will continue CA 125 levels every 6 weeks with a port flush, a cmet CBC every 12 weeks for right now but that we'll change in the future to every 6 months, followup visit in 3 months with [place], and I think she can discontinue her Coumadin since she has been on therapy for at least 12 months and a week now. Her risk has been diminished  since the treatment of her ovarian cancer has been put in remission.

## 2012-05-19 ENCOUNTER — Other Ambulatory Visit (HOSPITAL_COMMUNITY): Payer: BC Managed Care – PPO

## 2012-06-05 ENCOUNTER — Encounter (HOSPITAL_COMMUNITY): Payer: BC Managed Care – PPO | Attending: Oncology

## 2012-06-05 DIAGNOSIS — T451X5A Adverse effect of antineoplastic and immunosuppressive drugs, initial encounter: Secondary | ICD-10-CM | POA: Insufficient documentation

## 2012-06-05 DIAGNOSIS — I2699 Other pulmonary embolism without acute cor pulmonale: Secondary | ICD-10-CM | POA: Insufficient documentation

## 2012-06-05 DIAGNOSIS — C569 Malignant neoplasm of unspecified ovary: Secondary | ICD-10-CM | POA: Insufficient documentation

## 2012-07-13 ENCOUNTER — Ambulatory Visit: Payer: BC Managed Care – PPO | Admitting: Gynecologic Oncology

## 2012-07-17 ENCOUNTER — Encounter (HOSPITAL_COMMUNITY): Payer: BC Managed Care – PPO | Attending: Oncology

## 2012-07-17 DIAGNOSIS — C569 Malignant neoplasm of unspecified ovary: Secondary | ICD-10-CM | POA: Insufficient documentation

## 2012-07-17 DIAGNOSIS — T451X5A Adverse effect of antineoplastic and immunosuppressive drugs, initial encounter: Secondary | ICD-10-CM | POA: Insufficient documentation

## 2012-07-17 DIAGNOSIS — I2699 Other pulmonary embolism without acute cor pulmonale: Secondary | ICD-10-CM | POA: Insufficient documentation

## 2012-07-17 LAB — CBC WITH DIFFERENTIAL/PLATELET
Basophils Relative: 0 % (ref 0–1)
Eosinophils Absolute: 0.2 10*3/uL (ref 0.0–0.7)
HCT: 37.2 % (ref 36.0–46.0)
Hemoglobin: 12.7 g/dL (ref 12.0–15.0)
MCH: 33.3 pg (ref 26.0–34.0)
MCHC: 34.1 g/dL (ref 30.0–36.0)
Monocytes Absolute: 0.4 10*3/uL (ref 0.1–1.0)
Monocytes Relative: 7 % (ref 3–12)

## 2012-07-17 LAB — COMPREHENSIVE METABOLIC PANEL
ALT: 19 U/L (ref 0–35)
AST: 16 U/L (ref 0–37)
Albumin: 3.8 g/dL (ref 3.5–5.2)
Calcium: 9.8 mg/dL (ref 8.4–10.5)
Sodium: 139 mEq/L (ref 135–145)
Total Protein: 7.5 g/dL (ref 6.0–8.3)

## 2012-07-17 MED ORDER — HEPARIN SOD (PORK) LOCK FLUSH 100 UNIT/ML IV SOLN
INTRAVENOUS | Status: AC
  Filled 2012-07-17: qty 5

## 2012-07-17 MED ORDER — LIDOCAINE-PRILOCAINE 2.5-2.5 % EX CREA: TOPICAL_CREAM | CUTANEOUS | Status: DC

## 2012-07-17 MED ORDER — SODIUM CHLORIDE 0.9 % IJ SOLN
10.0000 mL | INTRAMUSCULAR | Status: DC | PRN
  Administered 2012-07-17: 10 mL via INTRAVENOUS
  Filled 2012-07-17: qty 10

## 2012-07-17 MED ORDER — HEPARIN SOD (PORK) LOCK FLUSH 100 UNIT/ML IV SOLN
500.0000 [IU] | Freq: Once | INTRAVENOUS | Status: AC
  Administered 2012-07-17: 500 [IU] via INTRAVENOUS
  Filled 2012-07-17: qty 5

## 2012-07-17 NOTE — Progress Notes (Signed)
Sarah Walker presented for Portacath access and flush. Proper placement of portacath confirmed by CXR. Portacath located left chest wall accessed with  H 20 needle. Good blood return present. Portacath flushed with 20ml NS and 500U/31ml Heparin and needle removed intact. Procedure without incident. Patient tolerated procedure well.

## 2012-07-18 ENCOUNTER — Other Ambulatory Visit (HOSPITAL_COMMUNITY): Payer: Self-pay | Admitting: Oncology

## 2012-07-18 DIAGNOSIS — E876 Hypokalemia: Secondary | ICD-10-CM

## 2012-07-18 LAB — CA 125: CA 125: 9.3 U/mL (ref 0.0–30.2)

## 2012-07-18 MED ORDER — POTASSIUM CHLORIDE CRYS ER 20 MEQ PO TBCR: 20.0000 meq | EXTENDED_RELEASE_TABLET | Freq: Every day | ORAL | Status: DC

## 2012-07-26 ENCOUNTER — Telehealth (HOSPITAL_COMMUNITY): Payer: Self-pay | Admitting: Oncology

## 2012-07-26 ENCOUNTER — Other Ambulatory Visit (HOSPITAL_COMMUNITY): Payer: Self-pay | Admitting: Oncology

## 2012-07-26 DIAGNOSIS — C569 Malignant neoplasm of unspecified ovary: Secondary | ICD-10-CM

## 2012-07-26 MED ORDER — HYDROCODONE-ACETAMINOPHEN 7.5-750 MG PO TABS: 1.0000 | ORAL_TABLET | Freq: Four times a day (QID) | ORAL | Status: DC | PRN

## 2012-08-02 ENCOUNTER — Other Ambulatory Visit (HOSPITAL_COMMUNITY): Payer: Self-pay | Admitting: Oncology

## 2012-08-02 DIAGNOSIS — C569 Malignant neoplasm of unspecified ovary: Secondary | ICD-10-CM

## 2012-08-02 MED ORDER — HYDROCODONE-ACETAMINOPHEN 7.5-300 MG PO TABS: 1.0000 | ORAL_TABLET | Freq: Four times a day (QID) | ORAL | Status: DC | PRN

## 2012-08-15 ENCOUNTER — Ambulatory Visit (HOSPITAL_COMMUNITY): Payer: BC Managed Care – PPO

## 2012-08-15 ENCOUNTER — Telehealth (HOSPITAL_COMMUNITY): Payer: Self-pay | Admitting: Oncology

## 2012-08-15 NOTE — Telephone Encounter (Signed)
Called to check on patient and follow-up about missed appt today - Sarah Walker states she simply forgot.  Sarah Walker was rescheduled and verbalized need for follow-up.

## 2012-08-23 ENCOUNTER — Ambulatory Visit: Payer: BC Managed Care – PPO | Admitting: Gynecologic Oncology

## 2012-08-25 ENCOUNTER — Ambulatory Visit: Payer: BC Managed Care – PPO | Admitting: Gynecology

## 2012-08-25 ENCOUNTER — Ambulatory Visit (HOSPITAL_COMMUNITY): Payer: BC Managed Care – PPO | Admitting: Oncology

## 2012-08-31 ENCOUNTER — Encounter (HOSPITAL_BASED_OUTPATIENT_CLINIC_OR_DEPARTMENT_OTHER): Payer: BC Managed Care – PPO

## 2012-08-31 ENCOUNTER — Encounter (HOSPITAL_COMMUNITY): Payer: BC Managed Care – PPO | Attending: Oncology | Admitting: Oncology

## 2012-08-31 VITALS — BP 143/80 | HR 64

## 2012-08-31 VITALS — BP 134/76 | HR 58 | Temp 97.4°F | Resp 18 | Wt 223.3 lb

## 2012-08-31 DIAGNOSIS — C569 Malignant neoplasm of unspecified ovary: Secondary | ICD-10-CM

## 2012-08-31 DIAGNOSIS — G609 Hereditary and idiopathic neuropathy, unspecified: Secondary | ICD-10-CM

## 2012-08-31 DIAGNOSIS — I2699 Other pulmonary embolism without acute cor pulmonale: Secondary | ICD-10-CM

## 2012-08-31 DIAGNOSIS — G629 Polyneuropathy, unspecified: Secondary | ICD-10-CM

## 2012-08-31 DIAGNOSIS — Z86711 Personal history of pulmonary embolism: Secondary | ICD-10-CM

## 2012-08-31 DIAGNOSIS — T451X5A Adverse effect of antineoplastic and immunosuppressive drugs, initial encounter: Secondary | ICD-10-CM | POA: Insufficient documentation

## 2012-08-31 DIAGNOSIS — Z452 Encounter for adjustment and management of vascular access device: Secondary | ICD-10-CM

## 2012-08-31 LAB — CBC WITH DIFFERENTIAL/PLATELET
Basophils Absolute: 0 10*3/uL (ref 0.0–0.1)
Basophils Relative: 0 % (ref 0–1)
HCT: 37.1 % (ref 36.0–46.0)
Lymphocytes Relative: 42 % (ref 12–46)
Monocytes Absolute: 0.3 10*3/uL (ref 0.1–1.0)
Neutro Abs: 2.2 10*3/uL (ref 1.7–7.7)
Neutrophils Relative %: 49 % (ref 43–77)
Platelets: 166 10*3/uL (ref 150–400)
RDW: 13.9 % (ref 11.5–15.5)
WBC: 4.4 10*3/uL (ref 4.0–10.5)

## 2012-08-31 LAB — COMPREHENSIVE METABOLIC PANEL
Alkaline Phosphatase: 67 U/L (ref 39–117)
BUN: 18 mg/dL (ref 6–23)
CO2: 25 mEq/L (ref 19–32)
Calcium: 9.6 mg/dL (ref 8.4–10.5)
GFR calc Af Amer: >90 mL/min (ref >90)
GFR calc non Af Amer: >90 mL/min (ref >90)
Glucose, Bld: 94 mg/dL (ref 70–99)
Potassium: 3.6 mEq/L (ref 3.5–5.1)
Total Protein: 7.1 g/dL (ref 6.0–8.3)

## 2012-08-31 MED ORDER — TRAMADOL HCL 50 MG PO TABS: 50.0000 mg | ORAL_TABLET | Freq: Three times a day (TID) | ORAL | Status: DC | PRN

## 2012-08-31 MED ORDER — SODIUM CHLORIDE 0.9 % IJ SOLN
10.0000 mL | INTRAMUSCULAR | Status: AC | PRN
  Administered 2012-08-31: 10 mL via INTRAVENOUS
  Filled 2012-08-31: qty 10

## 2012-08-31 MED ORDER — HEPARIN SOD (PORK) LOCK FLUSH 100 UNIT/ML IV SOLN
INTRAVENOUS | Status: AC
  Filled 2012-08-31: qty 5

## 2012-08-31 MED ORDER — HEPARIN SOD (PORK) LOCK FLUSH 100 UNIT/ML IV SOLN
500.0000 [IU] | Freq: Once | INTRAVENOUS | Status: AC
  Administered 2012-08-31: 500 [IU] via INTRAVENOUS
  Filled 2012-08-31: qty 5

## 2012-08-31 NOTE — Progress Notes (Signed)
[name], MD [address] [place] [address]  Ovarian cancer, unspecified laterality  CURRENT THERAPY: Surveillance  INTERVAL HISTORY: Sarah Walker 62 y.o. female returns for  regular  visit for followup of stage IA papillary serous cystadenocarcinoma status post surgical resection followed by adjuvant chemotherapy consisting of 6 cycles of carboplatin and paclitaxel from 07/15/2011 through 11/04/2011. At the time of presentation her cancer marker was elevated and remained elevated after surgery. It normalized with chemotherapy.   This lady's presentation in May of 2013 was complicated by pulmonary emboli. An IVC filter was placed initially prior surgery and was removed in December 2013. She has now been on anti-coagulation therapy since 05/10/2011. She has been very therapeutic the entire year essentially.   She is doing well from a oncology standpoint.  She was referred to Dr. Royetta Car for oncology follow-up, but somehow the appointment got cancelled.  She is going to check on her insurance to see if they will cover a referral back to Dr. Royetta Car.  If not, will refer the patient to Dr. Sanjuan Dame for Pap Smear.   She reports that she rarely take Hydrocodone for her peripheral neuropathy, but when she does take it, it is effective for her, but causes some itching.  Codeine is lists as an allergy on her list and therefore she may be experiencing a mild reaction to this medication.  She asks if there is anything else I can give her so I recommended Tramadol.  She reports that it is improving with consecutive days of resolution and then 1 day when it is bothersome.  She denies this side effect interfering with ADLs and IADLs.  She otherwise, denies any complaints.   I reviewed the NCCN guidelines of surveillance with her.  They are outlined below in the "Therapy plan" section.   She asks when she can have her port removed  and I will refer her to a local surgeon for port removal when I see her back in 3 months.    Oncologically, she denies any complaints and ROS questioning is negative.   Past Medical History  Diagnosis Date  . Allergic rhinitis   . Sinusitis   . Hypertension   . Ovarian cancer   . Pulmonary embolism   . Peripheral neuropathy 08/26/2011    Chemotherapy-induced  . Anemia associated with chemotherapy 09/16/2011  . Bone spur of other site     left foot    has Ovarian cancer; Hypertension; H/O hysterectomy with oophorectomy; Pulmonary embolism; Peripheral neuropathy; and Anemia associated with chemotherapy on her problem list.     is allergic to codeine and chicken allergy.  Sarah Walker had no medications administered during this visit.  Past Surgical History  Procedure Laterality Date  . Tonsillectomy      age 38's  . Cholecystectomy  1990  . Abdominal hysterectomy  05/25/2011    [place] bs&o and cancer staging  . Tubal ligation  1981  . Portacath placement  06/16/11    Done at [facility]  . Ivc filter  05/24/11    Denies any headaches, dizziness, double vision, fevers, chills, night sweats, nausea, vomiting, diarrhea, constipation, chest pain, heart palpitations, shortness of breath, blood in stool, black tarry stool, urinary pain, urinary burning, urinary frequency, hematuria.   PHYSICAL EXAMINATION  ECOG PERFORMANCE STATUS: 0 - Asymptomatic  Filed Vitals:   08/31/12 1333  Temp: 97.4 F (36.3 C)  Resp: 18    GENERAL:alert, no distress,  well nourished, well developed, comfortable, cooperative, obese and smiling SKIN: skin color, texture, turgor are normal, no rashes or significant lesions HEAD: Normocephalic, No masses, lesions, tenderness or abnormalities EYES: normal, PERRLA, EOMI, Conjunctiva are pink and non-injected EARS: External ears normal OROPHARYNX:mucous membranes are moist  NECK: supple, no adenopathy, thyroid normal size, non-tender, without nodularity, no  stridor, non-tender, trachea midline LYMPH:  no palpable lymphadenopathy BREAST:not examined LUNGS: clear to auscultation and percussion HEART: regular rate & rhythm, no murmurs, no gallops, S1 normal and S2 normal ABDOMEN:abdomen soft, non-tender, obese, normal bowel sounds, no masses or organomegaly and no hepatosplenomegaly BACK: Back symmetric, no curvature., No CVA tenderness EXTREMITIES:less then 2 second capillary refill, no joint deformities, effusion, or inflammation, no edema, no skin discoloration, no clubbing, no cyanosis  NEURO: alert & oriented x 3 with fluent speech, no focal motor/sensory deficits, gait normal    LABORATORY DATA: CBC    Component Value Date/Time   WBC 5.8 07/17/2012 1352   RBC 3.81* 07/17/2012 1352   HGB 12.7 07/17/2012 1352   HCT 37.2 07/17/2012 1352   PLT 210 07/17/2012 1352   MCV 97.6 07/17/2012 1352   MCH 33.3 07/17/2012 1352   MCHC 34.1 07/17/2012 1352   RDW 13.8 07/17/2012 1352   LYMPHSABS 2.2 07/17/2012 1352   MONOABS 0.4 07/17/2012 1352   EOSABS 0.2 07/17/2012 1352   BASOSABS 0.0 07/17/2012 1352      Chemistry      Component Value Date/Time   NA 139 07/17/2012 1352   K 3.3* 07/17/2012 1352   CL 101 07/17/2012 1352   CO2 31 07/17/2012 1352   BUN 21 07/17/2012 1352   CREATININE 0.71 07/17/2012 1352      Component Value Date/Time   CALCIUM 9.8 07/17/2012 1352   ALKPHOS 82 07/17/2012 1352   AST 16 07/17/2012 1352   ALT 19 07/17/2012 1352   BILITOT 0.2* 07/17/2012 1352        ASSESSMENT:  1. stage IA papillary serous cystadenocarcinoma status post surgical resection followed by adjuvant chemotherapy consisting of 6 cycles of carboplatin and paclitaxel from 07/15/2011 through 11/04/2011. At the time of presentation her cancer marker was elevated and remained elevated after surgery. It normalized with chemotherapy 2. H/O of PE following surgery at [facility] for #1, treated with 12 months of Coumadin anticoagulation (finishing in May 2014).  Patient Active  Problem List   Diagnosis Date Noted  . Anemia associated with chemotherapy 09/16/2011  . Peripheral neuropathy 08/26/2011  . Ovarian cancer 06/17/2011  . Hypertension 06/17/2011  . H/O hysterectomy with oophorectomy 06/17/2011  . Pulmonary embolism 06/17/2011     PLAN:  1. I personally reviewed and went over laboratory results with the patient. 2. Labs today: CBC diff, CMET, Hypercoag panel (per Dr. Mariel Sleet) 3. Labs every 6 weeks with port flushes: CA 125 (while patient has port) 4. Labs every 12 weeks: CBC diff, CMET (with CA 125) 5. Reviewed NCCN guidelines for surveillance.  6. Hypercoagulable panel per Dr. Thornton Papas orders which is reasonable now that she is off Coumadin and had a PE. 7. Return in 3 months for follow-up.  Will refer the patient to a local surgeon at that time for port-a-cath removal since she will be 1 year out from chemotherapy complation.   THERAPY PLAN:  Will continue surveillance by NCCN guidelines:  A. Visits every 2-4 mo x 2 years, then 3-6 months for 3 years, then annually after 5 years  B. CA 125 with each port flush for the  next 3 months, then we will transition to CA-125 every 3 months (after port is removed).   C. Imaging studies as clinically indicated.   D. Pap smears as indicated by Gyn.   All questions were answered. The patient knows to call the clinic with any problems, questions or concerns. We can certainly see the patient much sooner if necessary.  Patient and plan discussed with Dr. Erline Hau and he is in agreement with the aforementioned.   Sarah Walker

## 2012-08-31 NOTE — Progress Notes (Signed)
Numbness in feet are still @ the level of 6 no a scale of 0-10 with 10 being the worst numbness possible. Patient states that the feet no longer hurt.

## 2012-08-31 NOTE — Patient Instructions (Addendum)
.  [facility] Discharge Instructions  RECOMMENDATIONS MADE BY THE CONSULTANT AND ANY TEST RESULTS WILL BE SENT TO YOUR REFERRING PHYSICIAN.  EXAM FINDINGS BY THE PHYSICIAN TODAY AND SIGNS OR SYMPTOMS TO REPORT TO CLINIC OR PRIMARY PHYSICIAN: exam per T. Jacalyn Lefevre PA   INSTRUCTIONS GIVEN AND DISCUSSED: Please follow up with Dr. Duard Brady about your pap smear  SPECIAL INSTRUCTIONS/FOLLOW-UP: Labs every 6 weeks with port flush 3 months   Thank you for choosing [facility] to provide your oncology and hematology care.  To afford each patient quality time with our providers, please arrive at least 15 minutes before your scheduled appointment time.  With your help, our goal is to use those 15 minutes to complete the necessary work-up to ensure our physicians have the information they need to help with your evaluation and healthcare recommendations.    Effective January 1st, 2014, we ask that you re-schedule your appointment with our physicians should you arrive 10 or more minutes late for your appointment.  We strive to give you quality time with our providers, and arriving late affects you and other patients whose appointments are after yours.    Again, thank you for choosing [facility].  Our hope is that these requests will decrease the amount of time that you wait before being seen by our physicians.       _____________________________________________________________  Should you have questions after your visit to [facility], please contact our office at [phone] between the hours of 8:30 a.m. and 5:00 p.m.  Voicemails left after 4:30 p.m. will not be returned until the following business day.  For prescription refill requests, have your pharmacy contact our office with your prescription refill request.

## 2012-08-31 NOTE — Progress Notes (Unsigned)
Sarah Walker presented for Portacath access and flush. Proper placement of portacath confirmed by CXR. Portacath located rt chest wall accessed with  H 20 needle. Good blood return present. Portacath flushed with 20ml NS and 500U/5ml Heparin and needle removed intact. Procedure without incident. Patient tolerated procedure well.   

## 2012-09-01 LAB — PROTEIN C ACTIVITY: Protein C Activity: 169 % — ABNORMAL HIGH (ref 75–133)

## 2012-09-01 LAB — ANTITHROMBIN III: AntiThromb III Func: 115 % (ref 75–120)

## 2012-09-01 LAB — PROTEIN S ACTIVITY: Protein S Activity: 91 % (ref 69–129)

## 2012-09-01 LAB — LUPUS ANTICOAGULANT PANEL: Lupus Anticoagulant: NOT DETECTED

## 2012-09-01 LAB — BETA-2-GLYCOPROTEIN I ABS, IGG/M/A
Beta-2 Glyco I IgG: 1 G Units (ref <20)
Beta-2-Glycoprotein I IgA: 0 A Units (ref <20)
Beta-2-Glycoprotein I IgM: 0 M Units (ref <20)

## 2012-09-01 LAB — CARDIOLIPIN ANTIBODIES, IGG, IGM, IGA: Anticardiolipin IgA: 4 APL U/mL — ABNORMAL LOW (ref <22)

## 2012-09-01 LAB — PROTEIN C, TOTAL: Protein C, Total: 108 % (ref 72–160)

## 2012-09-25 ENCOUNTER — Other Ambulatory Visit (HOSPITAL_COMMUNITY): Payer: Self-pay | Admitting: Oncology

## 2012-10-12 ENCOUNTER — Encounter (HOSPITAL_COMMUNITY): Payer: BC Managed Care – PPO | Attending: Oncology

## 2012-10-12 DIAGNOSIS — I2699 Other pulmonary embolism without acute cor pulmonale: Secondary | ICD-10-CM

## 2012-10-12 DIAGNOSIS — C569 Malignant neoplasm of unspecified ovary: Secondary | ICD-10-CM | POA: Insufficient documentation

## 2012-10-12 DIAGNOSIS — Z452 Encounter for adjustment and management of vascular access device: Secondary | ICD-10-CM

## 2012-10-12 DIAGNOSIS — T451X5A Adverse effect of antineoplastic and immunosuppressive drugs, initial encounter: Secondary | ICD-10-CM | POA: Insufficient documentation

## 2012-10-12 MED ORDER — HEPARIN SOD (PORK) LOCK FLUSH 100 UNIT/ML IV SOLN
500.0000 [IU] | Freq: Once | INTRAVENOUS | Status: AC
  Administered 2012-10-12: 500 [IU] via INTRAVENOUS
  Filled 2012-10-12: qty 5

## 2012-10-12 MED ORDER — SODIUM CHLORIDE 0.9 % IJ SOLN
10.0000 mL | INTRAMUSCULAR | Status: DC | PRN
  Administered 2012-10-12: 10 mL via INTRAVENOUS

## 2012-10-12 NOTE — Progress Notes (Signed)
Sarah Walker presented for Portacath access and flush. Proper placement of portacath confirmed by CXR. Portacath located right chest wall accessed with  H 20 needle. Good blood return present. Portacath flushed with 20ml NS and 500U/5ml Heparin and needle removed intact. Procedure without incident. Patient tolerated procedure well.   

## 2012-10-13 LAB — CA 125: CA 125: 10.6 U/mL (ref 0.0–30.2)

## 2012-11-09 ENCOUNTER — Other Ambulatory Visit: Payer: Self-pay

## 2012-11-23 ENCOUNTER — Encounter (HOSPITAL_COMMUNITY): Payer: BC Managed Care – PPO | Attending: Oncology

## 2012-11-23 DIAGNOSIS — I2699 Other pulmonary embolism without acute cor pulmonale: Secondary | ICD-10-CM | POA: Insufficient documentation

## 2012-11-23 DIAGNOSIS — C569 Malignant neoplasm of unspecified ovary: Secondary | ICD-10-CM | POA: Insufficient documentation

## 2012-11-23 DIAGNOSIS — Z452 Encounter for adjustment and management of vascular access device: Secondary | ICD-10-CM

## 2012-11-23 DIAGNOSIS — T451X5A Adverse effect of antineoplastic and immunosuppressive drugs, initial encounter: Secondary | ICD-10-CM | POA: Insufficient documentation

## 2012-11-23 LAB — COMPREHENSIVE METABOLIC PANEL
BUN: 17 mg/dL (ref 6–23)
CO2: 27 mEq/L (ref 19–32)
Calcium: 9.6 mg/dL (ref 8.4–10.5)
Chloride: 101 mEq/L (ref 96–112)
Creatinine, Ser: 0.74 mg/dL (ref 0.50–1.10)
GFR calc non Af Amer: 89 mL/min — ABNORMAL LOW (ref >90)
Glucose, Bld: 86 mg/dL (ref 70–99)
Total Bilirubin: 0.2 mg/dL — ABNORMAL LOW (ref 0.3–1.2)

## 2012-11-23 LAB — CBC WITH DIFFERENTIAL/PLATELET
Basophils Relative: 0 % (ref 0–1)
HCT: 38.3 % (ref 36.0–46.0)
Hemoglobin: 12.9 g/dL (ref 12.0–15.0)
Lymphocytes Relative: 37 % (ref 12–46)
Lymphs Abs: 1.8 10*3/uL (ref 0.7–4.0)
MCV: 99.5 fL (ref 78.0–100.0)
Monocytes Absolute: 0.4 10*3/uL (ref 0.1–1.0)
Neutro Abs: 2.6 10*3/uL (ref 1.7–7.7)
Neutrophils Relative %: 52 % (ref 43–77)
Platelets: 186 10*3/uL (ref 150–400)
RBC: 3.85 MIL/uL — ABNORMAL LOW (ref 3.87–5.11)
WBC: 4.9 10*3/uL (ref 4.0–10.5)

## 2012-11-23 MED ORDER — HEPARIN SOD (PORK) LOCK FLUSH 100 UNIT/ML IV SOLN
500.0000 [IU] | Freq: Once | INTRAVENOUS | Status: AC
  Administered 2012-11-23: 500 [IU] via INTRAVENOUS

## 2012-11-23 MED ORDER — HEPARIN SOD (PORK) LOCK FLUSH 100 UNIT/ML IV SOLN
INTRAVENOUS | Status: AC
  Filled 2012-11-23: qty 5

## 2012-11-23 MED ORDER — SODIUM CHLORIDE 0.9 % IJ SOLN
10.0000 mL | INTRAMUSCULAR | Status: DC | PRN
  Administered 2012-11-23: 10 mL via INTRAVENOUS

## 2012-11-23 NOTE — Progress Notes (Signed)
Sarah Walker presented for Portacath access and flush. Proper placement of portacath confirmed by CXR. Portacath located lt chest wall accessed with  H 20 needle. Good blood return present. Portacath flushed with 20ml NS and 500U/23ml Heparin and needle removed intact. Procedure without incident. Patient tolerated procedure well.

## 2012-12-03 ENCOUNTER — Encounter (HOSPITAL_COMMUNITY): Payer: Self-pay | Admitting: Oncology

## 2012-12-03 NOTE — Progress Notes (Signed)
[name], MD [address] [place] [address]  Ovarian cancer, unspecified laterality - Plan: CBC with Differential, Comprehensive metabolic panel, CA 125  Hypokalemia - Plan: potassium chloride SA (K-DUR,KLOR-CON) 20 MEQ tablet  Peripheral neuropathy - Plan: HYDROcodone-acetaminophen (NORCO) 7.5-325 MG per tablet  CURRENT THERAPY:Surveillance per NCCN guidelines  INTERVAL HISTORY: Sarah Walker 62 y.o. female returns for  regular  visit for followup of stage IA papillary serous cystadenocarcinoma status post surgical resection followed by adjuvant chemotherapy consisting of 6 cycles of carboplatin and paclitaxel from 07/15/2011 through 11/04/2011. At the time of presentation her cancer marker was elevated and remained elevated after surgery. It normalized with chemotherapy.   This lady's presentation in May of 2013 was complicated by pulmonary emboli. An IVC filter was placed initially prior surgery and was removed in December 2013. She has now been on anti-coagulation therapy since 05/10/2011. She has been very therapeutic the entire year essentially.     Ovarian cancer on right   05/25/2011 Surgery Debulking surgery at [facility] in [facility]   05/25/2011 Initial Diagnosis Ovarian cancer   07/15/2011 - 11/04/2011 Chemotherapy Carboplatin/Pacliatexel x 6 cycles   12/01/2011 Remission CT scan negative for disease   I personally reviewed and went over laboratory results with the patient.  Labs from 11/23/2012 which demonstrated a K+ of 3.1.  She was started on Kdur appropriately.  CBC was unremarkable.   CA 125 is slowly increasing, but remains WNL at 12.0.  We reviewed the NCCN guidelines. NCCN guidelines for surveillance of Stage I-IV Epithelial Ovarian Cancer/ Fallopian Tube Cancer/ Primary Peritoneal Cancer recommends the following:  A. Vists every 2-4 months for first 2 years, then 3-6 months for years 3-5, and then annually after 5  years.  B. Physical exam including pelvic exam  C. CA-125 or other tumor markers every visit if initially elevated.  D. Refer for genetic risk evaluation if not previously performed.   E. CBC and chemistry profile as indicated.   F. CT Chest/Abdomen/Pelvis, MRI, PET-CT, or PET (Category 2B for PET) as clinically indicated.  G. Chest X-ray as indicted.   She does not want her port removed at this time as we will be performing lab work frequently for her 1st three years.    She had a few questions about cancer markers and therefore, I spent time reviewing cancer marker information with her.   Oncologically, she denies any complaints and ROS questioning is negative.   Past Medical History  Diagnosis Date  . Allergic rhinitis   . Sinusitis   . Hypertension   . Ovarian cancer   . Pulmonary embolism   . Peripheral neuropathy 08/26/2011    Chemotherapy-induced  . Anemia associated with chemotherapy 09/16/2011  . Bone spur of other site     left foot  . Ovarian cancer on right 06/17/2011    has Ovarian cancer on right; Hypertension; H/O hysterectomy with oophorectomy; Pulmonary embolism; Peripheral neuropathy; and Anemia associated with chemotherapy on her problem list.     is allergic to codeine and chicken allergy.  Ms. Nass had no medications administered during this visit.  Past Surgical History  Procedure Laterality Date  . Tonsillectomy      age 58's  . Cholecystectomy  1990  . Abdominal hysterectomy  05/25/2011    [place] bs&o and cancer staging  . Tubal ligation  1981  . Portacath placement  06/16/11    Done at [facility]  . Ivc  filter  05/24/11    Denies any headaches, dizziness, double vision, fevers, chills, night sweats, nausea, vomiting, diarrhea, constipation, chest pain, heart palpitations, shortness of breath, blood in stool, black tarry stool, urinary pain, urinary burning, urinary frequency, hematuria.   PHYSICAL EXAMINATION  ECOG PERFORMANCE STATUS: 0 -  Asymptomatic  Filed Vitals:   12/04/12 1500  BP: 122/74  Pulse: 70  Temp: 98.9 F (37.2 C)  Resp: 16    GENERAL:alert, no distress, well nourished, well developed, comfortable, cooperative, obese and smiling SKIN: skin color, texture, turgor are normal, no rashes or significant lesions HEAD: Normocephalic, No masses, lesions, tenderness or abnormalities EYES: normal, PERRLA, EOMI, Conjunctiva are pink and non-injected EARS: External ears normal OROPHARYNX:mucous membranes are moist  NECK: supple, no adenopathy, thyroid normal size, non-tender, without nodularity, no stridor, non-tender, trachea midline LYMPH:  no palpable lymphadenopathy, no hepatosplenomegaly BREAST:not examined LUNGS: clear to auscultation and percussion HEART: regular rate & rhythm, no murmurs, no gallops, S1 normal and S2 normal ABDOMEN:abdomen soft, non-tender, obese, normal bowel sounds, no masses or organomegaly and no hepatosplenomegaly BACK: Back symmetric, no curvature., No CVA tenderness EXTREMITIES:less then 2 second capillary refill, no joint deformities, effusion, or inflammation, no edema, no skin discoloration, no clubbing, no cyanosis  NEURO: alert & oriented x 3 with fluent speech, no focal motor/sensory deficits, gait normal   LABORATORY DATA: CBC    Component Value Date/Time   WBC 4.9 11/23/2012 1520   RBC 3.85* 11/23/2012 1520   HGB 12.9 11/23/2012 1520   HCT 38.3 11/23/2012 1520   PLT 186 11/23/2012 1520   MCV 99.5 11/23/2012 1520   MCH 33.5 11/23/2012 1520   MCHC 33.7 11/23/2012 1520   RDW 13.6 11/23/2012 1520   LYMPHSABS 1.8 11/23/2012 1520   MONOABS 0.4 11/23/2012 1520   EOSABS 0.1 11/23/2012 1520   BASOSABS 0.0 11/23/2012 1520      Chemistry      Component Value Date/Time   NA 137 11/23/2012 1520   K 3.1* 11/23/2012 1520   CL 101 11/23/2012 1520   CO2 27 11/23/2012 1520   BUN 17 11/23/2012 1520   CREATININE 0.74 11/23/2012 1520      Component Value Date/Time    CALCIUM 9.6 11/23/2012 1520   ALKPHOS 69 11/23/2012 1520   AST 14 11/23/2012 1520   ALT 17 11/23/2012 1520   BILITOT 0.2* 11/23/2012 1520     Lab Results  Component Value Date   CA125 12.0 11/23/2012      ASSESSMENT:  1. Stage IA papillary serous cystadenocarcinoma status post surgical resection followed by adjuvant chemotherapy consisting of 6 cycles of carboplatin and paclitaxel from 07/15/2011 through 11/04/2011. At the time of presentation her cancer marker was elevated and remained elevated after surgery. It normalized with chemotherapy  2. H/O of PE following surgery at [facility] for #1, treated with 12 months of Coumadin anticoagulation (finishing in May 2014). 3. Grade 1, peripheral neuropathy, improving.  Patient Active Problem List   Diagnosis Date Noted  . Anemia associated with chemotherapy 09/16/2011  . Peripheral neuropathy 08/26/2011  . Ovarian cancer on right 06/17/2011  . Hypertension 06/17/2011  . H/O hysterectomy with oophorectomy 06/17/2011  . Pulmonary embolism 06/17/2011     PLAN:  1. I personally reviewed and went over laboratory results with the patient. 2. Rx for Kdur 10 mEq 3. Rx for Hydrocodone 7.5 mg 4. Reviewed NCCN guidelines regarding surveillance 5. Patient education regarding cancer markers 6. Labs in 3 months: CBC diff, CMET, CA-125 7.  Return in 3 months for follow-up   THERAPY PLAN:  We will continue to follow NCCN guidelines for surveillance. NCCN guidelines for surveillance of Stage I-IV Epithelial Ovarian Cancer/ Fallopian Tube Cancer/ Primary Peritoneal Cancer recommends the following:  A. Vists every 2-4 months for first 2 years, then 3-6 months for years 3-5, and then annually after 5 years.  B. Physical exam including pelvic exam  C. CA-125 or other tumor markers every visit if initially elevated.  D. Refer for genetic risk evaluation if not previously performed.   E. CBC and chemistry profile as indicated.   F. CT  Chest/Abdomen/Pelvis, MRI, PET-CT, or PET (Category 2B for PET) as clinically indicated.  G. Chest X-ray as indicted.    All questions were answered. The patient knows to call the clinic with any problems, questions or concerns. We can certainly see the patient much sooner if necessary.  Patient and plan discussed with Dr. Alla German and he is in agreement with the aforementioned.   KEFALAS,THOMAS

## 2012-12-04 ENCOUNTER — Encounter (HOSPITAL_COMMUNITY): Payer: Self-pay | Admitting: Oncology

## 2012-12-04 ENCOUNTER — Encounter (HOSPITAL_COMMUNITY): Payer: BC Managed Care – PPO | Attending: Oncology | Admitting: Oncology

## 2012-12-04 VITALS — BP 122/74 | HR 70 | Temp 98.9°F | Resp 16 | Wt 220.3 lb

## 2012-12-04 DIAGNOSIS — Z86711 Personal history of pulmonary embolism: Secondary | ICD-10-CM

## 2012-12-04 DIAGNOSIS — C569 Malignant neoplasm of unspecified ovary: Secondary | ICD-10-CM | POA: Insufficient documentation

## 2012-12-04 DIAGNOSIS — G629 Polyneuropathy, unspecified: Secondary | ICD-10-CM

## 2012-12-04 DIAGNOSIS — T451X5A Adverse effect of antineoplastic and immunosuppressive drugs, initial encounter: Secondary | ICD-10-CM | POA: Insufficient documentation

## 2012-12-04 DIAGNOSIS — G609 Hereditary and idiopathic neuropathy, unspecified: Secondary | ICD-10-CM

## 2012-12-04 DIAGNOSIS — E876 Hypokalemia: Secondary | ICD-10-CM

## 2012-12-04 MED ORDER — POTASSIUM CHLORIDE CRYS ER 20 MEQ PO TBCR: EXTENDED_RELEASE_TABLET | ORAL | Status: DC

## 2012-12-04 MED ORDER — HYDROCODONE-ACETAMINOPHEN 7.5-325 MG PO TABS: 1.0000 | ORAL_TABLET | Freq: Four times a day (QID) | ORAL | Status: DC | PRN

## 2012-12-04 NOTE — Patient Instructions (Addendum)
  [facility] Discharge Instructions  RECOMMENDATIONS MADE BY THE CONSULTANT AND ANY TEST RESULTS WILL BE SENT TO YOUR REFERRING PHYSICIAN.  EXAM FINDINGS BY THE PHYSICIAN TODAY AND SIGNS OR SYMPTOMS TO REPORT TO CLINIC OR PRIMARY PHYSICIAN: Exam and findings as discussed by Dellis Anes, PA- C.  You are doing well.  Report unexplained weight loss or weigh gain, abdominal discomfort or other problems.  MEDICATIONS PRESCRIBED:  Refills for potassium and hydrocodone  INSTRUCTIONS/FOLLOW-UP: Port flushes every 6 weeks and follow-up in 3 months.  Thank you for choosing [facility] to provide your oncology and hematology care.  To afford each patient quality time with our providers, please arrive at least 15 minutes before your scheduled appointment time.  With your help, our goal is to use those 15 minutes to complete the necessary work-up to ensure our physicians have the information they need to help with your evaluation and healthcare recommendations.    Effective January 1st, 2014, we ask that you re-schedule your appointment with our physicians should you arrive 10 or more minutes late for your appointment.  We strive to give you quality time with our providers, and arriving late affects you and other patients whose appointments are after yours.    Again, thank you for choosing [facility].  Our hope is that these requests will decrease the amount of time that you wait before being seen by our physicians.       _____________________________________________________________  Should you have questions after your visit to [facility], please contact our office at [phone] between the hours of 8:30 a.m. and 5:00 p.m.  Voicemails left after 4:30 p.m. will not be returned until the following business day.  For prescription refill requests, have your pharmacy contact our office with your prescription refill request.

## 2013-01-03 ENCOUNTER — Encounter (HOSPITAL_BASED_OUTPATIENT_CLINIC_OR_DEPARTMENT_OTHER): Payer: BC Managed Care – PPO

## 2013-01-03 ENCOUNTER — Encounter (HOSPITAL_COMMUNITY): Payer: BC Managed Care – PPO

## 2013-01-03 DIAGNOSIS — C569 Malignant neoplasm of unspecified ovary: Secondary | ICD-10-CM

## 2013-01-03 DIAGNOSIS — Z452 Encounter for adjustment and management of vascular access device: Secondary | ICD-10-CM

## 2013-01-03 MED ORDER — HEPARIN SOD (PORK) LOCK FLUSH 100 UNIT/ML IV SOLN
INTRAVENOUS | Status: AC
  Filled 2013-01-03: qty 5

## 2013-01-03 MED ORDER — HEPARIN SOD (PORK) LOCK FLUSH 100 UNIT/ML IV SOLN
500.0000 [IU] | Freq: Once | INTRAVENOUS | Status: AC
  Administered 2013-01-03: 500 [IU] via INTRAVENOUS

## 2013-01-03 MED ORDER — SODIUM CHLORIDE 0.9 % IJ SOLN
10.0000 mL | INTRAMUSCULAR | Status: DC | PRN
  Administered 2013-01-03: 10 mL via INTRAVENOUS

## 2013-01-03 NOTE — Progress Notes (Signed)
Sarah Walker presented for Portacath access and flush. Portacath located rt chest wall accessed with  H 20 needle. Good blood return present. Portacath flushed with 20ml NS and 500U/69ml Heparin and needle removed intact. Procedure without incident. Patient tolerated procedure well.

## 2013-01-11 ENCOUNTER — Other Ambulatory Visit (HOSPITAL_COMMUNITY): Payer: Self-pay | Admitting: Oncology

## 2013-01-11 ENCOUNTER — Encounter (HOSPITAL_COMMUNITY): Payer: BC Managed Care – PPO

## 2013-01-11 DIAGNOSIS — J069 Acute upper respiratory infection, unspecified: Secondary | ICD-10-CM

## 2013-01-11 MED ORDER — AZITHROMYCIN 250 MG PO TABS: ORAL_TABLET | ORAL | Status: AC

## 2013-01-19 ENCOUNTER — Other Ambulatory Visit (HOSPITAL_COMMUNITY): Payer: Self-pay | Admitting: Oncology

## 2013-01-24 ENCOUNTER — Other Ambulatory Visit (HOSPITAL_COMMUNITY): Payer: Self-pay | Admitting: Oncology

## 2013-01-24 DIAGNOSIS — Z139 Encounter for screening, unspecified: Secondary | ICD-10-CM

## 2013-02-12 ENCOUNTER — Ambulatory Visit (HOSPITAL_COMMUNITY)
Admission: RE | Admit: 2013-02-12 | Discharge: 2013-02-12 | Disposition: A | Discharge status: Home / Self Care | Payer: BC Managed Care – PPO | Source: Ambulatory Visit | Attending: Oncology | Admitting: Oncology

## 2013-02-12 DIAGNOSIS — Z1231 Encounter for screening mammogram for malignant neoplasm of breast: Secondary | ICD-10-CM | POA: Insufficient documentation

## 2013-02-12 DIAGNOSIS — Z139 Encounter for screening, unspecified: Secondary | ICD-10-CM

## 2013-02-14 ENCOUNTER — Other Ambulatory Visit (HOSPITAL_COMMUNITY): Payer: BC Managed Care – PPO

## 2013-02-15 ENCOUNTER — Encounter (HOSPITAL_COMMUNITY): Payer: BC Managed Care – PPO | Attending: Oncology

## 2013-02-15 DIAGNOSIS — T451X5A Adverse effect of antineoplastic and immunosuppressive drugs, initial encounter: Secondary | ICD-10-CM | POA: Insufficient documentation

## 2013-02-15 DIAGNOSIS — Z95828 Presence of other vascular implants and grafts: Secondary | ICD-10-CM

## 2013-02-15 DIAGNOSIS — Z9889 Other specified postprocedural states: Secondary | ICD-10-CM | POA: Insufficient documentation

## 2013-02-15 DIAGNOSIS — Z452 Encounter for adjustment and management of vascular access device: Secondary | ICD-10-CM

## 2013-02-15 DIAGNOSIS — E876 Hypokalemia: Secondary | ICD-10-CM

## 2013-02-15 DIAGNOSIS — C569 Malignant neoplasm of unspecified ovary: Secondary | ICD-10-CM | POA: Insufficient documentation

## 2013-02-15 LAB — CBC WITH DIFFERENTIAL/PLATELET
Basophils Absolute: 0 10*3/uL (ref 0.0–0.1)
Basophils Relative: 0 % (ref 0–1)
EOS ABS: 0.1 10*3/uL (ref 0.0–0.7)
Eosinophils Relative: 3 % (ref 0–5)
HCT: 37 % (ref 36.0–46.0)
Hemoglobin: 12.5 g/dL (ref 12.0–15.0)
Lymphocytes Relative: 34 % (ref 12–46)
Lymphs Abs: 1.6 10*3/uL (ref 0.7–4.0)
MCH: 34 pg (ref 26.0–34.0)
MCHC: 33.8 g/dL (ref 30.0–36.0)
MCV: 100.5 fL — AB (ref 78.0–100.0)
MONOS PCT: 6 % (ref 3–12)
Monocytes Absolute: 0.3 10*3/uL (ref 0.1–1.0)
Neutro Abs: 2.6 10*3/uL (ref 1.7–7.7)
Neutrophils Relative %: 56 % (ref 43–77)
PLATELETS: 174 10*3/uL (ref 150–400)
RBC: 3.68 MIL/uL — ABNORMAL LOW (ref 3.87–5.11)
RDW: 14.1 % (ref 11.5–15.5)
WBC: 4.7 10*3/uL (ref 4.0–10.5)

## 2013-02-15 LAB — COMPREHENSIVE METABOLIC PANEL
ALT: 21 U/L (ref 0–35)
AST: 26 U/L (ref 0–37)
Albumin: 3.6 g/dL (ref 3.5–5.2)
Alkaline Phosphatase: 67 U/L (ref 39–117)
BUN: 21 mg/dL (ref 6–23)
CALCIUM: 9.2 mg/dL (ref 8.4–10.5)
CO2: 23 mEq/L (ref 19–32)
Chloride: 101 mEq/L (ref 96–112)
Creatinine, Ser: 0.68 mg/dL (ref 0.50–1.10)
GLUCOSE: 155 mg/dL — AB (ref 70–99)
Potassium: 3.8 mEq/L (ref 3.7–5.3)
SODIUM: 139 meq/L (ref 137–147)
TOTAL PROTEIN: 6.8 g/dL (ref 6.0–8.3)
Total Bilirubin: 0.3 mg/dL (ref 0.3–1.2)

## 2013-02-15 MED ORDER — HEPARIN SOD (PORK) LOCK FLUSH 100 UNIT/ML IV SOLN
500.0000 [IU] | Freq: Once | INTRAVENOUS | Status: AC
  Administered 2013-02-15: 500 [IU] via INTRAVENOUS
  Filled 2013-02-15: qty 5

## 2013-02-15 MED ORDER — SODIUM CHLORIDE 0.9 % IJ SOLN
10.0000 mL | INTRAMUSCULAR | Status: DC | PRN
  Administered 2013-02-15: 10 mL via INTRAVENOUS

## 2013-02-15 NOTE — Progress Notes (Signed)
Sarah Walker presented for Portacath access and flush. Proper placement of portacath confirmed by CXR. Portacath located right chest wall accessed with  H 20 needle. Good blood return present. Portacath flushed with 20ml NS and 500U/5ml Heparin and needle removed intact. Procedure without incident. Patient tolerated procedure well.   

## 2013-02-16 LAB — CA 125: CA 125: 13.1 U/mL (ref 0.0–30.2)

## 2013-02-22 ENCOUNTER — Encounter (HOSPITAL_COMMUNITY): Payer: BC Managed Care – PPO

## 2013-02-22 ENCOUNTER — Other Ambulatory Visit (HOSPITAL_COMMUNITY): Payer: Self-pay | Admitting: Oncology

## 2013-03-04 NOTE — Progress Notes (Signed)
[name], MD [address] [place] [address]  Ovarian cancer on right - Plan: CBC with Differential, Comprehensive metabolic panel, CA 125  Peripheral neuropathy - Plan: HYDROcodone-acetaminophen (NORCO) 7.5-325 MG per tablet  CURRENT THERAPY: Surveillance per NCCN guidelines  INTERVAL HISTORY: Sarah Walker 63 y.o. female returns for  regular  visit for followup of stage IA papillary serous cystadenocarcinoma status post surgical resection followed by adjuvant chemotherapy consisting of 6 cycles of carboplatin and paclitaxel from 07/15/2011 through 11/04/2011. At the time of presentation her cancer marker was elevated and remained elevated after surgery. It normalized with chemotherapy.   This lady's presentation in May of 2013 was complicated by pulmonary emboli. An IVC filter was placed initially prior surgery and was removed in December 2013. She has now been on anti-coagulation therapy since 05/10/2011. She has been very therapeutic the entire year essentially.    Ovarian cancer on right   05/25/2011 Surgery Debulking surgery at [facility] in [facility]   05/25/2011 Initial Diagnosis Ovarian cancer   07/15/2011 - 11/04/2011 Chemotherapy Carboplatin/Pacliatexel x 6 cycles   12/01/2011 Remission CT scan negative for disease   I personally reviewed and went over laboratory results with the patient.  The results are noted within this dictation.  I personally reviewed and went over radiographic studies with the patient.  The results are noted within this dictation.  Screening mammogram performed on 02/12/2013 was BIRADS 1.  She will be due for her next screening mammogram in Feb 2016.   NCCN guidelines for surveillance of Stage I-IV Epithelial Ovarian Cancer/ Fallopian Tube Cancer/ Primary Peritoneal Cancer recommends the following:  A. Vists every 2-4 months for first 2 years, then 3-6 months for years 3-5, and then annually after 5  years.  B. Physical exam including pelvic exam  C. CA-125 or other tumor markers every visit if initially elevated.  D. Refer for genetic risk evaluation if not previously performed.   E. CBC and chemistry profile as indicated.   F. CT Chest/Abdomen/Pelvis, MRI, PET-CT, or PET (Category 2B for PET) as clinically indicated.  G. Chest X-ray as indicted.   The patient is doing very well. Her only complaint is continued peripheral neuropathy which she admits is much better today compared to last year. I provided patient education regarding peripheral neuropathy. Her peripheral neuropathy is chemotherapy induced from prior therapy for her ovarian cancer. She notes that it affects her fingertips and her toes. She denies any interference with ADLs. This seems like a grade 1 peripheral neuropathy. She does take hydrocodone for this discomfort which is beneficial for her. She was given 60 tablets of hydrocodone and has lasted over 3 months.  She needs to see a gynecologic oncologist for an annual internal vaginal exam. She reports that she will make an appointment with her physician at [facility].  I educated the patient regarding the importance of having a pelvic examination. If she has any issues, I would not hesitate refer her to [facility] to see the gynecologic oncologists at [facility].  She notes that her husband has had bilateral arthroplasty of knees. It sounds like her husband is due to see Dr. Thurston Hole at [facility]  Oncologically, the patient otherwise denies any complaints and ROS questioning is negative.  Past Medical History  Diagnosis Date  . Allergic rhinitis   . Sinusitis   . Hypertension   . Ovarian cancer   . Pulmonary embolism   .  Peripheral neuropathy 08/26/2011    Chemotherapy-induced  . Anemia associated with chemotherapy 09/16/2011  . Bone spur of other site     left foot  . Ovarian cancer on right 06/17/2011    has Ovarian cancer on right;  Hypertension; H/O hysterectomy with oophorectomy; Pulmonary embolism; and Peripheral neuropathy on her problem list.     is allergic to codeine and chicken allergy.  Sarah Walker had no medications administered during this visit.  Past Surgical History  Procedure Laterality Date  . Tonsillectomy      age 11's  . Cholecystectomy  1990  . Abdominal hysterectomy  05/25/2011    [facility] bs&o and cancer staging  . Tubal ligation  1981  . Portacath placement  06/16/11    Done at [facility]  . Ivc filter  05/24/11    Denies any headaches, dizziness, double vision, fevers, chills, night sweats, nausea, vomiting, diarrhea, constipation, chest pain, heart palpitations, shortness of breath, blood in stool, black tarry stool, urinary pain, urinary burning, urinary frequency, hematuria.   PHYSICAL EXAMINATION  ECOG PERFORMANCE STATUS: 0 - Asymptomatic  Filed Vitals:   03/05/13 1400  BP: 119/79  Pulse: 66  Temp: 98.4 F (36.9 C)  Resp: 20    GENERAL:alert, no distress, well nourished, well developed, comfortable, cooperative, obese and smiling SKIN: skin color, texture, turgor are normal, no rashes or significant lesions HEAD: Normocephalic, No masses, lesions, tenderness or abnormalities EYES: normal, PERRLA, EOMI, Conjunctiva are pink and non-injected EARS: External ears normal OROPHARYNX:mucous membranes are moist  NECK: supple, no adenopathy, thyroid normal size, non-tender, without nodularity, no stridor, non-tender, trachea midline LYMPH:  no palpable lymphadenopathy BREAST:not examined LUNGS: clear to auscultation and percussion HEART: regular rate & rhythm, no murmurs, no gallops, S1 normal and S2 normal ABDOMEN:abdomen soft, non-tender, obese and normal bowel sounds BACK: Back symmetric, no curvature. EXTREMITIES:less then 2 second capillary refill, no joint deformities, effusion, or inflammation, no edema, no skin discoloration, no clubbing, no cyanosis  NEURO: alert & oriented  x 3 with fluent speech, no focal motor/sensory deficits, gait normal    LABORATORY DATA: CBC    Component Value Date/Time   WBC 4.7 02/15/2013 1440   RBC 3.68* 02/15/2013 1440   HGB 12.5 02/15/2013 1440   HCT 37.0 02/15/2013 1440   PLT 174 02/15/2013 1440   MCV 100.5* 02/15/2013 1440   MCH 34.0 02/15/2013 1440   MCHC 33.8 02/15/2013 1440   RDW 14.1 02/15/2013 1440   LYMPHSABS 1.6 02/15/2013 1440   MONOABS 0.3 02/15/2013 1440   EOSABS 0.1 02/15/2013 1440   BASOSABS 0.0 02/15/2013 1440      Chemistry      Component Value Date/Time   NA 139 02/15/2013 1440   K 3.8 02/15/2013 1440   CL 101 02/15/2013 1440   CO2 23 02/15/2013 1440   BUN 21 02/15/2013 1440   CREATININE 0.68 02/15/2013 1440      Component Value Date/Time   CALCIUM 9.2 02/15/2013 1440   ALKPHOS 67 02/15/2013 1440   AST 26 02/15/2013 1440   ALT 21 02/15/2013 1440   BILITOT 0.3 02/15/2013 1440     Lab Results  Component Value Date   CA125 13.1 02/15/2013     RADIOGRAPHIC STUDIES:  02/12/2013  CLINICAL DATA: Screening.  EXAM:  DIGITAL SCREENING BILATERAL MAMMOGRAM WITH CAD  COMPARISON: Previous exam(s).  ACR Breast Density Category b: There are scattered areas of  fibroglandular density.  FINDINGS:  There are no findings suspicious for malignancy. Images were  processed with CAD.  IMPRESSION:  No mammographic evidence of malignancy. A result letter of this  screening mammogram will be mailed directly to the patient.  RECOMMENDATION:  Screening mammogram in one year. (Code:[id number]-B-01Y)  BI-RADS CATEGORY 1: Negative.  Electronically Signed  By: Luberta Robertson M.D.  On: 02/14/2013 09:25    ASSESSMENT:  1. Stage IA papillary serous cystadenocarcinoma status post surgical resection followed by adjuvant chemotherapy consisting of 6 cycles of carboplatin and paclitaxel from 07/15/2011 through 11/04/2011. At the time of presentation her cancer marker was elevated and remained elevated after surgery. It normalized with  chemotherapy  2. H/O of PE following surgery at [facility] for #1, treated with 12 months of Coumadin anticoagulation (finishing in May 2014).  3. Grade 1, peripheral neuropathy, improving. 4. Increasing CA 125, but WNL and only small increases 5. Grade 1 peripheral neuropathy  Patient Active Problem List   Diagnosis Date Noted  . Peripheral neuropathy 08/26/2011  . Ovarian cancer on right 06/17/2011  . Hypertension 06/17/2011  . H/O hysterectomy with oophorectomy 06/17/2011  . Pulmonary embolism 06/17/2011      PLAN:  1. I personally reviewed and went over laboratory results with the patient.  The results are noted within this dictation. 2. I personally reviewed and went over radiographic studies with the patient.  The results are noted within this dictation.   3. Next screening mammogram is due in Feb 2016. 4. Review of NCCN guidelines pertaining to surveillance 5. Labs in 12 weeks: CBC diff, CMET, CA-125 6. Return in 12 weeks for follow-up.  Near Sept/Oct 2015 we will space out appointments to every 3-6 months per NCCN guidelines for years 3-5.    THERAPY PLAN:  CA-125 is WNL, but trending upwards in very small increments.  Will continue to monitor closely.  Will continue with surveillance per NCCN guidelines at this time.  I would have a low threshold for performing CT CAP with contrast for an increasing CA-125.  NCCN guidelines for surveillance of Stage I-IV Epithelial Ovarian Cancer/ Fallopian Tube Cancer/ Primary Peritoneal Cancer recommends the following:  A. Vists every 2-4 months for first 2 years, then 3-6 months for years 3-5, and then annually after 5 years.  B. Physical exam including pelvic exam  C. CA-125 or other tumor markers every visit if initially elevated.  D. Refer for genetic risk evaluation if not previously performed.   E. CBC and chemistry profile as indicated.   F. CT Chest/Abdomen/Pelvis, MRI, PET-CT, or PET (Category 2B for PET) as clinically indicated.  G.  Chest X-ray as indicted.    All questions were answered. The patient knows to call the clinic with any problems, questions or concerns. We can certainly see the patient much sooner if necessary.  Patient and plan discussed with Dr. Farrel Gobble and he is in agreement with the aforementioned.   Sarah Walker

## 2013-03-05 ENCOUNTER — Encounter (HOSPITAL_COMMUNITY): Payer: Self-pay | Admitting: Oncology

## 2013-03-05 ENCOUNTER — Ambulatory Visit (HOSPITAL_COMMUNITY): Payer: BC Managed Care – PPO | Admitting: Oncology

## 2013-03-05 ENCOUNTER — Encounter (HOSPITAL_COMMUNITY): Payer: BC Managed Care – PPO | Attending: Oncology | Admitting: Oncology

## 2013-03-05 VITALS — BP 119/79 | HR 66 | Temp 98.4°F | Resp 20 | Wt 226.1 lb

## 2013-03-05 DIAGNOSIS — C569 Malignant neoplasm of unspecified ovary: Secondary | ICD-10-CM | POA: Insufficient documentation

## 2013-03-05 DIAGNOSIS — G629 Polyneuropathy, unspecified: Secondary | ICD-10-CM

## 2013-03-05 DIAGNOSIS — T451X5A Adverse effect of antineoplastic and immunosuppressive drugs, initial encounter: Secondary | ICD-10-CM | POA: Insufficient documentation

## 2013-03-05 DIAGNOSIS — Z9889 Other specified postprocedural states: Secondary | ICD-10-CM | POA: Insufficient documentation

## 2013-03-05 DIAGNOSIS — C561 Malignant neoplasm of right ovary: Secondary | ICD-10-CM

## 2013-03-05 DIAGNOSIS — G609 Hereditary and idiopathic neuropathy, unspecified: Secondary | ICD-10-CM | POA: Insufficient documentation

## 2013-03-05 MED ORDER — HYDROCODONE-ACETAMINOPHEN 7.5-325 MG PO TABS: 1.0000 | ORAL_TABLET | Freq: Four times a day (QID) | ORAL | Status: DC | PRN

## 2013-03-05 NOTE — Patient Instructions (Signed)
[facility] Discharge Instructions  RECOMMENDATIONS MADE BY THE CONSULTANT AND ANY TEST RESULTS WILL BE SENT TO YOUR REFERRING PHYSICIAN.  EXAM FINDINGS BY THE PHYSICIAN TODAY AND SIGNS OR SYMPTOMS TO REPORT TO CLINIC OR PRIMARY PHYSICIAN: Exam and findings as discussed by Robynn Pane, PA-C.  Blood work is stable.  You are doing well.  Continue follow-up with your GYN oncologist and see Dr. Laural Golden for your colonoscopy.  Report any unusual symptoms or concerns.  MEDICATIONS PRESCRIBED:  none  INSTRUCTIONS/FOLLOW-UP: Follow-up with blood work and office visit in 3 months.  Thank you for choosing [facility] to provide your oncology and hematology care.  To afford each patient quality time with our providers, please arrive at least 15 minutes before your scheduled appointment time.  With your help, our goal is to use those 15 minutes to complete the necessary work-up to ensure our physicians have the information they need to help with your evaluation and healthcare recommendations.    Effective January 1st, 2014, we ask that you re-schedule your appointment with our physicians should you arrive 10 or more minutes late for your appointment.  We strive to give you quality time with our providers, and arriving late affects you and other patients whose appointments are after yours.    Again, thank you for choosing [facility].  Our hope is that these requests will decrease the amount of time that you wait before being seen by our physicians.       _____________________________________________________________  Should you have questions after your visit to [facility], please contact our office at [phone] between the hours of 8:30 a.m. and 5:00 p.m.  Voicemails left after 4:30 p.m. will not be returned until the following business day.  For prescription refill requests, have your pharmacy contact our office with your prescription  refill request.

## 2013-03-26 ENCOUNTER — Telehealth (HOSPITAL_COMMUNITY): Payer: Self-pay | Admitting: *Deleted

## 2013-03-26 ENCOUNTER — Other Ambulatory Visit (HOSPITAL_COMMUNITY): Payer: Self-pay | Admitting: Oncology

## 2013-03-26 DIAGNOSIS — C561 Malignant neoplasm of right ovary: Secondary | ICD-10-CM

## 2013-03-26 DIAGNOSIS — E876 Hypokalemia: Secondary | ICD-10-CM

## 2013-03-26 MED ORDER — POTASSIUM CHLORIDE CRYS ER 20 MEQ PO TBCR: 20.0000 meq | EXTENDED_RELEASE_TABLET | Freq: Once | ORAL | Status: DC

## 2013-03-26 NOTE — Telephone Encounter (Signed)
Sarah Walker called and said she needs refill on her Potassium (the kind that melts in your mouth, as she cannot swallow the other kind) called in to [facility].

## 2013-03-28 ENCOUNTER — Encounter (HOSPITAL_BASED_OUTPATIENT_CLINIC_OR_DEPARTMENT_OTHER): Payer: BC Managed Care – PPO

## 2013-03-28 DIAGNOSIS — C569 Malignant neoplasm of unspecified ovary: Secondary | ICD-10-CM

## 2013-03-28 DIAGNOSIS — Z5111 Encounter for antineoplastic chemotherapy: Secondary | ICD-10-CM

## 2013-03-28 DIAGNOSIS — Z95828 Presence of other vascular implants and grafts: Secondary | ICD-10-CM

## 2013-03-28 MED ORDER — HEPARIN SOD (PORK) LOCK FLUSH 100 UNIT/ML IV SOLN
500.0000 [IU] | Freq: Once | INTRAVENOUS | Status: AC
  Administered 2013-03-28: 500 [IU] via INTRAVENOUS

## 2013-03-28 MED ORDER — SODIUM CHLORIDE 0.9 % IJ SOLN
10.0000 mL | INTRAMUSCULAR | Status: DC | PRN
  Administered 2013-03-28: 10 mL via INTRAVENOUS

## 2013-03-28 MED ORDER — HEPARIN SOD (PORK) LOCK FLUSH 100 UNIT/ML IV SOLN
INTRAVENOUS | Status: AC
  Filled 2013-03-28: qty 5

## 2013-03-28 NOTE — Progress Notes (Signed)
Sarah Walker presented for Portacath access and flush.  Proper placement of portacath confirmed by CXR.  Portacath located right  chest wall accessed with  H 20 needle.  Good blood return present. Portacath flushed with 27ml NS and 500U/66ml Heparin and needle removed intact.  Procedure tolerated well and without incident.

## 2013-04-05 ENCOUNTER — Encounter (HOSPITAL_COMMUNITY): Payer: BC Managed Care – PPO

## 2013-05-09 ENCOUNTER — Other Ambulatory Visit (HOSPITAL_COMMUNITY): Payer: BC Managed Care – PPO

## 2013-05-21 ENCOUNTER — Other Ambulatory Visit (HOSPITAL_COMMUNITY): Payer: Self-pay | Admitting: Oncology

## 2013-05-29 ENCOUNTER — Ambulatory Visit (HOSPITAL_COMMUNITY): Payer: BC Managed Care – PPO | Admitting: Oncology

## 2013-05-29 ENCOUNTER — Encounter (HOSPITAL_COMMUNITY): Payer: BC Managed Care – PPO | Attending: Oncology

## 2013-05-29 DIAGNOSIS — Z452 Encounter for adjustment and management of vascular access device: Secondary | ICD-10-CM

## 2013-05-29 DIAGNOSIS — C561 Malignant neoplasm of right ovary: Secondary | ICD-10-CM

## 2013-05-29 DIAGNOSIS — T451X5A Adverse effect of antineoplastic and immunosuppressive drugs, initial encounter: Secondary | ICD-10-CM | POA: Insufficient documentation

## 2013-05-29 DIAGNOSIS — C569 Malignant neoplasm of unspecified ovary: Secondary | ICD-10-CM | POA: Insufficient documentation

## 2013-05-29 LAB — CBC WITH DIFFERENTIAL/PLATELET
BASOS ABS: 0 10*3/uL (ref 0.0–0.1)
Basophils Relative: 1 % (ref 0–1)
EOS PCT: 2 % (ref 0–5)
Eosinophils Absolute: 0.1 10*3/uL (ref 0.0–0.7)
HEMATOCRIT: 38.2 % (ref 36.0–46.0)
HEMOGLOBIN: 12.9 g/dL (ref 12.0–15.0)
LYMPHS PCT: 42 % (ref 12–46)
Lymphs Abs: 1.8 10*3/uL (ref 0.7–4.0)
MCH: 33.2 pg (ref 26.0–34.0)
MCHC: 33.8 g/dL (ref 30.0–36.0)
MCV: 98.5 fL (ref 78.0–100.0)
MONO ABS: 0.3 10*3/uL (ref 0.1–1.0)
MONOS PCT: 8 % (ref 3–12)
Neutro Abs: 2 10*3/uL (ref 1.7–7.7)
Neutrophils Relative %: 47 % (ref 43–77)
Platelets: 169 10*3/uL (ref 150–400)
RBC: 3.88 MIL/uL (ref 3.87–5.11)
RDW: 13.8 % (ref 11.5–15.5)
WBC: 4.3 10*3/uL (ref 4.0–10.5)

## 2013-05-29 LAB — COMPREHENSIVE METABOLIC PANEL
ALT: 17 U/L (ref 0–35)
AST: 16 U/L (ref 0–37)
Albumin: 3.8 g/dL (ref 3.5–5.2)
Alkaline Phosphatase: 69 U/L (ref 39–117)
BILIRUBIN TOTAL: 0.5 mg/dL (ref 0.3–1.2)
BUN: 19 mg/dL (ref 6–23)
CALCIUM: 9.3 mg/dL (ref 8.4–10.5)
CHLORIDE: 100 meq/L (ref 96–112)
CO2: 26 mEq/L (ref 19–32)
CREATININE: 0.7 mg/dL (ref 0.50–1.10)
GFR calc Af Amer: >90 mL/min (ref >90)
GFR, EST NON AFRICAN AMERICAN: 90 mL/min — AB (ref >90)
Glucose, Bld: 89 mg/dL (ref 70–99)
Potassium: 3.5 mEq/L — ABNORMAL LOW (ref 3.7–5.3)
Sodium: 140 mEq/L (ref 137–147)
Total Protein: 6.9 g/dL (ref 6.0–8.3)

## 2013-05-29 MED ORDER — SODIUM CHLORIDE 0.9 % IJ SOLN
10.0000 mL | INTRAMUSCULAR | Status: DC | PRN
  Administered 2013-05-29: 10 mL via INTRAVENOUS

## 2013-05-29 MED ORDER — HEPARIN SOD (PORK) LOCK FLUSH 100 UNIT/ML IV SOLN
500.0000 [IU] | Freq: Once | INTRAVENOUS | Status: AC
  Administered 2013-05-29: 500 [IU] via INTRAVENOUS
  Filled 2013-05-29: qty 5

## 2013-05-29 NOTE — Progress Notes (Signed)
Sarah Walker presented for Portacath access and flush.  Portacath located right chest wall accessed with  H 20 needle.  Good blood return present. Portacath flushed with 18ml NS and 500U/21ml Heparin and needle removed intact.  Procedure tolerated well and without incident.

## 2013-05-30 LAB — CA 125: CA 125: 9.8 U/mL (ref 0.0–30.2)

## 2013-06-03 NOTE — Progress Notes (Signed)
Sarah Grills, MD [place] [address]  Ovarian cancer on right  URI (upper respiratory infection) - Plan: amoxicillin-clavulanate (AUGMENTIN) 875-125 MG per tablet  Vaginal candidiasis - Plan: fluconazole (DIFLUCAN) 100 MG tablet  CURRENT THERAPY: Surveillance per NCCN guidelines  INTERVAL HISTORY: Sarah Walker 63 y.o. female returns for  regular  visit for followup of stage IA papillary serous cystadenocarcinoma status post surgical resection followed by adjuvant chemotherapy consisting of 6 cycles of carboplatin and paclitaxel from 07/15/2011 through 11/04/2011. At the time of presentation her cancer marker was elevated and remained elevated after surgery. It normalized with chemotherapy.   This lady's presentation in May of 1191 was complicated by pulmonary emboli. An IVC filter was placed initially prior surgery and was removed in December 2013. She has now been on anti-coagulation therapy since 05/10/2011. She has been very therapeutic the entire year essentially.  Anticoagulation was discontinued following one year's worth of anticoagulation.    Ovarian cancer on right   05/25/2011 Surgery Debulking surgery at [facility] in [facility]   05/25/2011 Initial Diagnosis Ovarian cancer   07/15/2011 - 11/04/2011 Chemotherapy Carboplatin/Pacliatexel x 6 cycles   12/01/2011 Remission CT scan negative for disease   05/15/2012 Imaging CT abd/pelvis- Negative for mass or adenopathy.     I personally reviewed and went over laboratory results with the patient.  The results are noted within this dictation.  Ileta complains of URI-symptoms including a sore throat and a productive cough of yellow sputum.  She reports that it began 4 days ago and she started Augmentin at home from a previous Rx she did not finish.  She took 3 tablets starting yesterday of Augmentin.  Since she has started Augmentin, I will give her a 7 day supply of this antibiotic.  She notes that  when she takes an antibiotic, she usually gets vaginal candidiasis, therefore, I gave her an Rx for diflucan.    She questions the role of deodorant and its association with breast cancer.  There is not clear-cut data regarding this association and therefore I will defer that decision to the patient.   With this being said, she should maintain recommended breast cancer surveillance as directed with annual mammograms and self-breast exams.   Her husband recently underwent knee arthroplasty and he was given Hydrocodone 10 mg.  She reports that she took one of those tablets and this was more effective for her than the 7.5/325 we have been prescribing her.  As a result, she requests a change to his dose of medication.  I have declined to do so for two reasons: 1. There is lack of data demonstrating cancer related pain and 2. There will come a time when we discontinue prescribing this medication.  She is agreeable to this plan.  She take the medication for neuropathy discomfort after failing gabapentin therapy.  From an oncology standpoint, she is very stable and she denies any worrisome complaints.    Past Medical History  Diagnosis Date  . Allergic rhinitis   . Sinusitis   . Hypertension   . Ovarian cancer   . Pulmonary embolism   . Peripheral neuropathy 08/26/2011    Chemotherapy-induced  . Anemia associated with chemotherapy 09/16/2011  . Bone spur of other site     left foot  . Ovarian cancer on right 06/17/2011    has Ovarian cancer on right; Hypertension; H/O hysterectomy with oophorectomy; and Peripheral neuropathy on her problem list.     is allergic to  codeine and chicken allergy.  [name] does not currently have medications on file.  Past Surgical History  Procedure Laterality Date  . Tonsillectomy      age 12's  . Cholecystectomy  1990  . Abdominal hysterectomy  05/25/2011    [place] bs&o and cancer staging  . Tubal ligation  1981  . Portacath placement  06/16/11    Done at  [facility]  . Ivc filter  05/24/11    Denies any headaches, dizziness, double vision, fevers, chills, night sweats, nausea, vomiting, diarrhea, constipation, chest pain, heart palpitations, shortness of breath, blood in stool, black tarry stool, urinary pain, urinary burning, urinary frequency, hematuria.   PHYSICAL EXAMINATION  ECOG PERFORMANCE STATUS: 0 - Asymptomatic  Filed Vitals:   06/05/13 1435  BP: 149/92  Pulse: 67  Temp: 98 F (36.7 C)  Resp: 18    GENERAL:alert, no distress, well nourished, well developed, comfortable, cooperative, obese and smiling SKIN: skin color, texture, turgor are normal, no rashes or significant lesions HEAD: Normocephalic, No masses, lesions, tenderness or abnormalities EYES: normal, PERRLA, EOMI, Conjunctiva are pink and non-injected EARS: External ears normal OROPHARYNX:mucous membranes are moist, posterior pharynx erythema NECK: supple, trachea midline, right anterior cervical chain lymph node that measures 1 cm that is soft and mobile. LYMPH:  See neck exam BREAST:not examined LUNGS: clear to auscultation  HEART: regular rate & rhythm and no murmurs ABDOMEN:abdomen soft, non-tender, obese, normal bowel sounds and no hepatosplenomegaly BACK: Back symmetric, no curvature. EXTREMITIES:less then 2 second capillary refill, no joint deformities, effusion, or inflammation, no edema, no skin discoloration, no clubbing, no cyanosis  NEURO: alert & oriented x 3 with fluent speech, no focal motor/sensory deficits, gait normal    LABORATORY DATA: CBC    Component Value Date/Time   WBC 4.3 05/29/2013 1412   RBC 3.88 05/29/2013 1412   HGB 12.9 05/29/2013 1412   HCT 38.2 05/29/2013 1412   PLT 169 05/29/2013 1412   MCV 98.5 05/29/2013 1412   MCH 33.2 05/29/2013 1412   MCHC 33.8 05/29/2013 1412   RDW 13.8 05/29/2013 1412   LYMPHSABS 1.8 05/29/2013 1412   MONOABS 0.3 05/29/2013 1412   EOSABS 0.1 05/29/2013 1412   BASOSABS 0.0 05/29/2013 1412       Chemistry      Component Value Date/Time   NA 140 05/29/2013 1412   K 3.5* 05/29/2013 1412   CL 100 05/29/2013 1412   CO2 26 05/29/2013 1412   BUN 19 05/29/2013 1412   CREATININE 0.70 05/29/2013 1412      Component Value Date/Time   CALCIUM 9.3 05/29/2013 1412   ALKPHOS 69 05/29/2013 1412   AST 16 05/29/2013 1412   ALT 17 05/29/2013 1412   BILITOT 0.5 05/29/2013 1412        ASSESSMENT:  1. Stage IA papillary serous cystadenocarcinoma status post surgical resection followed by adjuvant chemotherapy consisting of 6 cycles of carboplatin and paclitaxel from 07/15/2011 through 11/04/2011. At the time of presentation her cancer marker was elevated and remained elevated after surgery. It normalized with chemotherapy  2. H/O of PE following surgery at [facility] for #1, treated with 12 months of Coumadin anticoagulation (finishing in May 2014).  3. Grade 1, peripheral neuropathy, improving.  4. Fluctuating CA 125, but WNL  5. Grade 1 peripheral neuropathy 6. URI with reactive right anterior chain lymph node  Patient Active Problem List   Diagnosis Date Noted  . Peripheral neuropathy 08/26/2011  . Ovarian cancer on right 06/17/2011  .  Hypertension 06/17/2011  . H/O hysterectomy with oophorectomy 06/17/2011    PLAN:  1. I personally reviewed and went over laboratory results with the patient.  The results are noted within this dictation. 2. I personally reviewed and went over radiographic studies with the patient.  The results are noted within this dictation.   3. Next screening mammogram is due in Feb 2016.  4. Labs in 12 weeks: CBC diff, CMET, CA-125 5. She can have her port-a-cath removed at any time. 6. Rx for Augmentin 7. Rx for Diflucan 8. Refused to increase Hydrocodone dose 9. Defer deodorant use to the patient; I am indifferent regarding that debate. 10. Return in 3 months for follow-up.  If all is well at that time, we will see her every 3-6 months per NCCN guidelines for years  3-5.   THERAPY PLAN:  NCCN guidelines for surveillance of Stage I-IV Epithelial Ovarian Cancer/ Fallopian Tube Cancer/ Primary Peritoneal Cancer recommends the following:  A. Vists every 2-4 months for first 2 years, then 3-6 months for years 3-5, and then annually after 5 years.  B. Physical exam including pelvic exam  C. CA-125 or other tumor markers every visit if initially elevated.  D. Refer for genetic risk evaluation if not previously performed.   E. CBC and chemistry profile as indicated.   F. CT Chest/Abdomen/Pelvis, MRI, PET-CT, or PET (Category 2B for PET) as clinically indicated.  G. Chest X-ray as indicted.   All questions were answered. The patient knows to call the clinic with any problems, questions or concerns. We can certainly see the patient much sooner if necessary.  Patient and plan discussed with Dr. Farrel Gobble and he is in agreement with the aforementioned.   [facility] 06/05/2013

## 2013-06-05 ENCOUNTER — Encounter (HOSPITAL_COMMUNITY): Payer: BC Managed Care – PPO | Attending: Oncology | Admitting: Oncology

## 2013-06-05 VITALS — BP 149/92 | HR 67 | Temp 98.0°F | Resp 18 | Wt 226.0 lb

## 2013-06-05 DIAGNOSIS — B373 Candidiasis of vulva and vagina: Secondary | ICD-10-CM

## 2013-06-05 DIAGNOSIS — C569 Malignant neoplasm of unspecified ovary: Secondary | ICD-10-CM

## 2013-06-05 DIAGNOSIS — C561 Malignant neoplasm of right ovary: Secondary | ICD-10-CM

## 2013-06-05 DIAGNOSIS — G609 Hereditary and idiopathic neuropathy, unspecified: Secondary | ICD-10-CM

## 2013-06-05 DIAGNOSIS — B3731 Acute candidiasis of vulva and vagina: Secondary | ICD-10-CM | POA: Insufficient documentation

## 2013-06-05 DIAGNOSIS — Z86711 Personal history of pulmonary embolism: Secondary | ICD-10-CM

## 2013-06-05 DIAGNOSIS — G629 Polyneuropathy, unspecified: Secondary | ICD-10-CM

## 2013-06-05 DIAGNOSIS — J069 Acute upper respiratory infection, unspecified: Secondary | ICD-10-CM | POA: Insufficient documentation

## 2013-06-05 DIAGNOSIS — T451X5A Adverse effect of antineoplastic and immunosuppressive drugs, initial encounter: Secondary | ICD-10-CM | POA: Insufficient documentation

## 2013-06-05 MED ORDER — HYDROCODONE-ACETAMINOPHEN 7.5-325 MG PO TABS: 1.0000 | ORAL_TABLET | Freq: Four times a day (QID) | ORAL | Status: DC | PRN

## 2013-06-05 MED ORDER — AMOXICILLIN-POT CLAVULANATE 875-125 MG PO TABS: 1.0000 | ORAL_TABLET | Freq: Two times a day (BID) | ORAL | Status: AC

## 2013-06-05 MED ORDER — FLUCONAZOLE 100 MG PO TABS: 100.0000 mg | ORAL_TABLET | Freq: Every day | ORAL | Status: AC

## 2013-06-05 NOTE — Patient Instructions (Signed)
[facility] Discharge Instructions  RECOMMENDATIONS MADE BY THE CONSULTANT AND ANY TEST RESULTS WILL BE SENT TO YOUR REFERRING PHYSICIAN.  Return in 3 months for blood work and office visit.  Prescriptions for Augmentin and Diflucan sent to your pharmacy.   Thank you for choosing [facility] to provide your oncology and hematology care.  To afford each patient quality time with our providers, please arrive at least 15 minutes before your scheduled appointment time.  With your help, our goal is to use those 15 minutes to complete the necessary work-up to ensure our physicians have the information they need to help with your evaluation and healthcare recommendations.    Effective January 1st, 2014, we ask that you re-schedule your appointment with our physicians should you arrive 10 or more minutes late for your appointment.  We strive to give you quality time with our providers, and arriving late affects you and other patients whose appointments are after yours.    Again, thank you for choosing [facility].  Our hope is that these requests will decrease the amount of time that you wait before being seen by our physicians.       _____________________________________________________________  Should you have questions after your visit to [facility], please contact our office at [phone] between the hours of 8:30 a.m. and 5:00 p.m.  Voicemails left after 4:30 p.m. will not be returned until the following business day.  For prescription refill requests, have your pharmacy contact our office with your prescription refill request.

## 2013-06-15 ENCOUNTER — Encounter (INDEPENDENT_AMBULATORY_CARE_PROVIDER_SITE_OTHER): Payer: Self-pay | Admitting: *Deleted

## 2013-07-11 ENCOUNTER — Encounter (HOSPITAL_COMMUNITY): Payer: BC Managed Care – PPO | Attending: Oncology

## 2013-07-11 ENCOUNTER — Encounter (HOSPITAL_COMMUNITY): Payer: BC Managed Care – PPO

## 2013-07-11 DIAGNOSIS — C569 Malignant neoplasm of unspecified ovary: Secondary | ICD-10-CM

## 2013-07-11 DIAGNOSIS — Z9889 Other specified postprocedural states: Secondary | ICD-10-CM | POA: Insufficient documentation

## 2013-07-11 DIAGNOSIS — Z452 Encounter for adjustment and management of vascular access device: Secondary | ICD-10-CM

## 2013-07-11 DIAGNOSIS — T451X5A Adverse effect of antineoplastic and immunosuppressive drugs, initial encounter: Secondary | ICD-10-CM | POA: Insufficient documentation

## 2013-07-11 DIAGNOSIS — Z95828 Presence of other vascular implants and grafts: Secondary | ICD-10-CM

## 2013-07-11 MED ORDER — SODIUM CHLORIDE 0.9 % IJ SOLN
10.0000 mL | Freq: Once | INTRAMUSCULAR | Status: AC
  Administered 2013-07-11: 10 mL via INTRAVENOUS

## 2013-07-11 MED ORDER — HEPARIN SOD (PORK) LOCK FLUSH 100 UNIT/ML IV SOLN
500.0000 [IU] | Freq: Once | INTRAVENOUS | Status: AC
  Administered 2013-07-11: 500 [IU] via INTRAVENOUS

## 2013-07-11 MED ORDER — HEPARIN SOD (PORK) LOCK FLUSH 100 UNIT/ML IV SOLN
INTRAVENOUS | Status: AC
  Filled 2013-07-11: qty 5

## 2013-07-11 NOTE — Progress Notes (Signed)
Sarah Walker presented for Portacath access and flush. Proper placement of portacath confirmed by CXR. Portacath located right chest wall accessed with  H 20 needle. Good blood return present. Portacath flushed with 51ml NS and 500U/32ml Heparin and needle removed intact. Procedure without incident. Patient tolerated procedure well.

## 2013-07-18 ENCOUNTER — Other Ambulatory Visit (HOSPITAL_COMMUNITY): Payer: Self-pay | Admitting: Oncology

## 2013-08-07 ENCOUNTER — Ambulatory Visit (HOSPITAL_COMMUNITY)
Admission: RE | Admit: 2013-08-07 | Discharge: 2013-08-07 | Disposition: A | Discharge status: Home / Self Care | Payer: BC Managed Care – PPO | Source: Ambulatory Visit | Attending: Family Medicine | Admitting: Family Medicine

## 2013-08-07 ENCOUNTER — Other Ambulatory Visit (HOSPITAL_COMMUNITY): Payer: Self-pay | Admitting: Family Medicine

## 2013-08-07 DIAGNOSIS — R059 Cough, unspecified: Secondary | ICD-10-CM | POA: Insufficient documentation

## 2013-08-07 DIAGNOSIS — R05 Cough: Secondary | ICD-10-CM

## 2013-09-04 ENCOUNTER — Encounter (HOSPITAL_COMMUNITY): Payer: BC Managed Care – PPO

## 2013-09-05 ENCOUNTER — Ambulatory Visit (HOSPITAL_COMMUNITY): Payer: BC Managed Care – PPO | Admitting: Oncology

## 2013-09-05 ENCOUNTER — Encounter (HOSPITAL_COMMUNITY): Payer: BC Managed Care – PPO

## 2013-09-05 ENCOUNTER — Ambulatory Visit (HOSPITAL_COMMUNITY): Payer: BC Managed Care – PPO

## 2013-09-12 ENCOUNTER — Encounter (HOSPITAL_COMMUNITY): Payer: Self-pay

## 2013-09-12 ENCOUNTER — Encounter (HOSPITAL_COMMUNITY): Payer: BC Managed Care – PPO | Attending: Oncology

## 2013-09-12 VITALS — BP 127/58 | HR 68 | Temp 97.9°F | Resp 16

## 2013-09-12 DIAGNOSIS — C569 Malignant neoplasm of unspecified ovary: Secondary | ICD-10-CM

## 2013-09-12 DIAGNOSIS — Z9889 Other specified postprocedural states: Secondary | ICD-10-CM | POA: Diagnosis present

## 2013-09-12 DIAGNOSIS — Z452 Encounter for adjustment and management of vascular access device: Secondary | ICD-10-CM

## 2013-09-12 DIAGNOSIS — C561 Malignant neoplasm of right ovary: Secondary | ICD-10-CM

## 2013-09-12 LAB — CBC WITH DIFFERENTIAL/PLATELET
BASOS PCT: 0 % (ref 0–1)
Basophils Absolute: 0 10*3/uL (ref 0.0–0.1)
EOS ABS: 0.1 10*3/uL (ref 0.0–0.7)
Eosinophils Relative: 2 % (ref 0–5)
HCT: 40.3 % (ref 36.0–46.0)
HEMOGLOBIN: 13.5 g/dL (ref 12.0–15.0)
Lymphocytes Relative: 34 % (ref 12–46)
Lymphs Abs: 2.1 10*3/uL (ref 0.7–4.0)
MCH: 33.8 pg (ref 26.0–34.0)
MCHC: 33.5 g/dL (ref 30.0–36.0)
MCV: 100.8 fL — ABNORMAL HIGH (ref 78.0–100.0)
Monocytes Absolute: 0.4 10*3/uL (ref 0.1–1.0)
Monocytes Relative: 7 % (ref 3–12)
NEUTROS ABS: 3.5 10*3/uL (ref 1.7–7.7)
Neutrophils Relative %: 57 % (ref 43–77)
PLATELETS: 206 10*3/uL (ref 150–400)
RBC: 4 MIL/uL (ref 3.87–5.11)
RDW: 14.2 % (ref 11.5–15.5)
WBC: 6.2 10*3/uL (ref 4.0–10.5)

## 2013-09-12 LAB — COMPREHENSIVE METABOLIC PANEL
ALK PHOS: 66 U/L (ref 39–117)
ALT: 18 U/L (ref 0–35)
AST: 16 U/L (ref 0–37)
Albumin: 3.7 g/dL (ref 3.5–5.2)
Anion gap: 12 (ref 5–15)
BILIRUBIN TOTAL: 0.3 mg/dL (ref 0.3–1.2)
BUN: 17 mg/dL (ref 6–23)
CHLORIDE: 103 meq/L (ref 96–112)
CO2: 26 mEq/L (ref 19–32)
Calcium: 9.2 mg/dL (ref 8.4–10.5)
Creatinine, Ser: 0.73 mg/dL (ref 0.50–1.10)
GFR calc Af Amer: >90 mL/min (ref >90)
GFR calc non Af Amer: 88 mL/min — ABNORMAL LOW (ref >90)
Glucose, Bld: 86 mg/dL (ref 70–99)
Potassium: 3.7 mEq/L (ref 3.7–5.3)
SODIUM: 141 meq/L (ref 137–147)
TOTAL PROTEIN: 6.8 g/dL (ref 6.0–8.3)

## 2013-09-12 MED ORDER — HEPARIN SOD (PORK) LOCK FLUSH 100 UNIT/ML IV SOLN
500.0000 [IU] | Freq: Once | INTRAVENOUS | Status: AC
  Administered 2013-09-12: 500 [IU] via INTRAVENOUS
  Filled 2013-09-12: qty 5

## 2013-09-12 MED ORDER — SODIUM CHLORIDE 0.9 % IJ SOLN
10.0000 mL | INTRAMUSCULAR | Status: AC | PRN
  Administered 2013-09-12: 10 mL via INTRAVENOUS

## 2013-09-12 NOTE — Progress Notes (Unsigned)
Roberts Gaudy presented for Portacath access and flush. Proper placement of portacath confirmed by CXR. Portacath located rt chest wall accessed with  H 20 needle. Good blood return present. Portacath flushed with 35ml NS and 500U/55ml Heparin and needle removed intact. Procedure without incident. Patient tolerated procedure well.

## 2013-09-17 ENCOUNTER — Encounter (HOSPITAL_COMMUNITY): Payer: Self-pay

## 2013-09-17 LAB — CA 125

## 2013-09-18 ENCOUNTER — Telehealth (HOSPITAL_COMMUNITY): Payer: Self-pay

## 2013-09-18 ENCOUNTER — Encounter (HOSPITAL_COMMUNITY): Payer: Self-pay

## 2013-09-18 ENCOUNTER — Encounter (HOSPITAL_BASED_OUTPATIENT_CLINIC_OR_DEPARTMENT_OTHER): Payer: BC Managed Care – PPO

## 2013-09-18 VITALS — BP 139/85 | HR 66 | Temp 97.5°F | Resp 18 | Wt 227.3 lb

## 2013-09-18 DIAGNOSIS — C569 Malignant neoplasm of unspecified ovary: Secondary | ICD-10-CM

## 2013-09-18 DIAGNOSIS — G629 Polyneuropathy, unspecified: Secondary | ICD-10-CM

## 2013-09-18 DIAGNOSIS — C561 Malignant neoplasm of right ovary: Secondary | ICD-10-CM

## 2013-09-18 DIAGNOSIS — G609 Hereditary and idiopathic neuropathy, unspecified: Secondary | ICD-10-CM

## 2013-09-18 DIAGNOSIS — I2699 Other pulmonary embolism without acute cor pulmonale: Secondary | ICD-10-CM

## 2013-09-18 NOTE — Patient Instructions (Signed)
[facility] Discharge Instructions  RECOMMENDATIONS MADE BY THE CONSULTANT AND ANY TEST RESULTS WILL BE SENT TO YOUR REFERRING PHYSICIAN.  We will see you in 6 months for repeat labs and a doctor's appointment. Written order was given to have lifeport removed.  Quinine water 6 oz. Every night to help with leg cramps.    Thank you for choosing [facility] to provide your oncology and hematology care.  To afford each patient quality time with our providers, please arrive at least 15 minutes before your scheduled appointment time.  With your help, our goal is to use those 15 minutes to complete the necessary work-up to ensure our physicians have the information they need to help with your evaluation and healthcare recommendations.    Effective January 1st, 2014, we ask that you re-schedule your appointment with our physicians should you arrive 10 or more minutes late for your appointment.  We strive to give you quality time with our providers, and arriving late affects you and other patients whose appointments are after yours.    Again, thank you for choosing [facility].  Our hope is that these requests will decrease the amount of time that you wait before being seen by our physicians.       _____________________________________________________________  Should you have questions after your visit to [facility], please contact our office at [phone] between the hours of 8:30 a.m. and 4:30 p.m.  Voicemails left after 4:30 p.m. will not be returned until the following business day.  For prescription refill requests, have your pharmacy contact our office with your prescription refill request.    _______________________________________________________________  We hope that we have given you very good care.  You may receive a patient satisfaction survey in the mail, please complete it and return it as soon as possible.  We value your  feedback!  _______________________________________________________________  Have you asked about our STAR program?  STAR stands for Survivorship Training and Rehabilitation, and this is a nationally recognized cancer care program that focuses on survivorship and rehabilitation.  Cancer and cancer treatments may cause problems, such as, pain, making you feel tired and keeping you from doing the things that you need or want to do. Cancer rehabilitation can help. Our goal is to reduce these troubling effects and help you have the best quality of life possible.  You may receive a survey from a nurse that asks questions about your current state of health.  Based on the survey results, all eligible patients will be referred to the [facility] program for an evaluation so we can better serve you!  A frequently asked questions sheet is available upon request.

## 2013-09-18 NOTE — Progress Notes (Signed)
[facility]  OFFICE PROGRESS NOTE  [name], MD [place] [address]  DIAGNOSIS: Ovary cancer, unspecified laterality - Plan: CA 125, CBC with Differential, Comprehensive metabolic panel, CA 094, Magnesium, CANCELED: CBC with Differential, CANCELED: Comprehensive metabolic panel, CANCELED: Lactate dehydrogenase  Ovarian cancer on right - Plan: CBC with Differential, Comprehensive metabolic panel, CA 709, Magnesium  Peripheral neuropathy  Pulmonary embolism  Chief Complaint  Patient presents with  . Ovarian Cancer    CURRENT THERAPY: Surveillance for carcinoma of the ovary.  INTERVAL HISTORY: Sarah Walker 63 y.o. female returns for followup of stage IA papillary serous cystadenocarcinoma status post surgical resection followed by adjuvant chemotherapy consisting of 6 cycles of carboplatin and paclitaxel from 07/15/2011 through 11/04/2011. At the time of presentation her cancer marker was elevated and remained elevated after surgery. It normalized with chemotherapy. Patient continues to do well with excellent appetite. No nausea, vomiting, diarrhea, constipation, melena, hematochezia, vaginal bleeding, hematuria, incontinence, lower extremity swelling or redness, skin rash, cough, wheezing, sore throat, headache, or seizures. She denies any hot flashes, abdominal distention, but does get cramps in her lower 70s when she stretches in the evening  She does take potassium supplements. She denies any peripheral paresthesias.    MEDICAL HISTORY: Past Medical History  Diagnosis Date  . Allergic rhinitis   . Sinusitis   . Hypertension   . Ovarian cancer   . Pulmonary embolism   . Peripheral neuropathy 08/26/2011    Chemotherapy-induced  . Anemia associated with chemotherapy 09/16/2011  . Bone spur of other site     left foot  . Ovarian cancer on right 06/17/2011    INTERIM HISTORY: has Ovarian cancer on  right; Hypertension; H/O hysterectomy with oophorectomy; and Peripheral neuropathy on her problem list.   Ovarian cancer on right  05/25/2011  Surgery  Debulking surgery at [facility] in [facility]  05/25/2011  Initial Diagnosis  Ovarian cancer  07/15/2011 - 11/04/2011  Chemotherapy  Carboplatin/Pacliatexel x 6 cycles  12/01/2011  Remission  CT scan negative for disease  05/15/2012  Imaging  CT abd/pelvis- Negative for mass or adenopathy.    ALLERGIES:  is allergic to codeine and chicken allergy.  MEDICATIONS: has a current medication list which includes the following prescription(s): bisoprolol-hydrochlorothiazide, b-12, fexofenadine-pseudoephedrine, hydrocodone-acetaminophen, lidocaine-prilocaine, potassium chloride sa, UNABLE TO FIND, and valsartan, and the following Facility-Administered Medications: sodium chloride and sodium chloride.  SURGICAL HISTORY:  Past Surgical History  Procedure Laterality Date  . Tonsillectomy      age 10's  . Cholecystectomy  1990  . Abdominal hysterectomy  05/25/2011    [place] [facility] and cancer staging  . Tubal ligation  1981  . Portacath placement  06/16/11    Done at [facility]  . Ivc filter  05/24/11    FAMILY HISTORY: family history includes Cancer in her father; Stroke in her mother.  SOCIAL HISTORY:  reports that she has never smoked. She has never used smokeless tobacco. She reports that she does not drink alcohol or use illicit drugs.  REVIEW OF SYSTEMS:  Other than that discussed above is noncontributory.  PHYSICAL EXAMINATION: ECOG PERFORMANCE STATUS: 1 - Symptomatic but completely ambulatory  Blood pressure 139/85, pulse 66, temperature 97.5 F (36.4 C), temperature source Oral, resp. rate 18, weight 227 lb 4.8 oz (103.103 kg), SpO2 98.00%.  GENERAL:alert, no distress and comfortable. Moderately obese. SKIN: skin color, texture, turgor are normal, no rashes or significant lesions  EYES: PERLA; Conjunctiva are pink and  non-injected, sclera clear SINUSES: No redness or tenderness over maxillary or ethmoid sinuses OROPHARYNX:no exudate, no erythema on lips, buccal mucosa, or tongue. NECK: supple, thyroid normal size, non-tender, without nodularity. No masses CHEST: LifePort in place. No breast masses. LYMPH:  no palpable lymphadenopathy in the cervical, axillary or inguinal LUNGS: clear to auscultation and percussion with normal breathing effort HEART: regular rate & rhythm and no murmurs. ABDOMEN:abdomen soft, non-tender and normal bowel sounds. Obese and soft with no appreciable organomegaly. No free fluid wave or shifting dullness. MUSCULOSKELETAL:no cyanosis of digits and no clubbing. Range of motion normal.  NEURO: alert & oriented x 3 with fluent speech, no focal motor/sensory deficits   LABORATORY DATA: CA 125 of 20 Infusion on 09/12/2013  Component Date Value Ref Range Status  . WBC 09/12/2013 6.2  4.0 - 10.5 K/uL Final  . RBC 09/12/2013 4.00  3.87 - 5.11 MIL/uL Final  . Hemoglobin 09/12/2013 13.5  12.0 - 15.0 g/dL Final  . HCT 09/12/2013 40.3  36.0 - 46.0 % Final  . MCV 09/12/2013 100.8* 78.0 - 100.0 fL Final  . MCH 09/12/2013 33.8  26.0 - 34.0 pg Final  . MCHC 09/12/2013 33.5  30.0 - 36.0 g/dL Final  . RDW 09/12/2013 14.2  11.5 - 15.5 % Final  . Platelets 09/12/2013 206  150 - 400 K/uL Final  . Neutrophils Relative % 09/12/2013 57  43 - 77 % Final  . Neutro Abs 09/12/2013 3.5  1.7 - 7.7 K/uL Final  . Lymphocytes Relative 09/12/2013 34  12 - 46 % Final  . Lymphs Abs 09/12/2013 2.1  0.7 - 4.0 K/uL Final  . Monocytes Relative 09/12/2013 7  3 - 12 % Final  . Monocytes Absolute 09/12/2013 0.4  0.1 - 1.0 K/uL Final  . Eosinophils Relative 09/12/2013 2  0 - 5 % Final  . Eosinophils Absolute 09/12/2013 0.1  0.0 - 0.7 K/uL Final  . Basophils Relative 09/12/2013 0  0 - 1 % Final  . Basophils Absolute 09/12/2013 0.0  0.0 - 0.1 K/uL Final  . Sodium 09/12/2013 141  137 - 147 mEq/L Final  . Potassium  09/12/2013 3.7  3.7 - 5.3 mEq/L Final  . Chloride 09/12/2013 103  96 - 112 mEq/L Final  . CO2 09/12/2013 26  19 - 32 mEq/L Final  . Glucose, Bld 09/12/2013 86  70 - 99 mg/dL Final  . BUN 09/12/2013 17  6 - 23 mg/dL Final  . Creatinine, Ser 09/12/2013 0.73  0.50 - 1.10 mg/dL Final  . Calcium 09/12/2013 9.2  8.4 - 10.5 mg/dL Final  . Total Protein 09/12/2013 6.8  6.0 - 8.3 g/dL Final  . Albumin 09/12/2013 3.7  3.5 - 5.2 g/dL Final  . AST 09/12/2013 16  0 - 37 U/L Final  . ALT 09/12/2013 18  0 - 35 U/L Final  . Alkaline Phosphatase 09/12/2013 66  39 - 117 U/L Final  . Total Bilirubin 09/12/2013 0.3  0.3 - 1.2 mg/dL Final  . GFR calc non Af Amer 09/12/2013 88* >90 mL/min Final  . GFR calc Af Amer 09/12/2013 >90  >90 mL/min Final   Comment: (NOTE)                          The eGFR has been calculated using the CKD EPI equation.  This calculation has not been validated in all clinical situations.                          eGFR's persistently <90 mL/min signify possible Chronic Kidney                          Disease.  . Anion gap 09/12/2013 12  5 - 15 Final    PATHOLOGY: No new pathology.  Urinalysis No results found for this basename: colorurine,  appearanceur,  labspec,  phurine,  glucoseu,  hgbur,  bilirubinur,  ketonesur,  proteinur,  urobilinogen,  nitrite,  leukocytesur    RADIOGRAPHIC STUDIES: No results found.  ASSESSMENT:  1. Stage IA papillary serous cystadenocarcinoma status post surgical resection followed by adjuvant chemotherapy consisting of 6 cycles of carboplatin and paclitaxel from 07/15/2011 through 11/04/2011. At the time of presentation her cancer marker was elevated and remained elevated after surgery. It normalized with chemotherapy  2. H/O of PE following surgery at [facility] for #1, treated with 12 months of Coumadin anticoagulation (finishing in May 2014).  3. Grade 1, peripheral neuropathy, improving. 4. Night cramps probably secondary to  microvascular disease. 5. Seasonal rhinitis, controlled.    PLAN:  #1. Life port may be removed. Prescription given. #2. Trial of quinine water 6 ounces each evening to help with night cramps. #3. Followup in 6 months with CBC, chem profile, CA 125, and magnesium level.   All questions were answered. The patient knows to call the clinic with any problems, questions or concerns. We can certainly see the patient much sooner if necessary.   I spent 25 minutes counseling the patient face to face. The total time spent in the appointment was 30 minutes.    Doroteo Bradford, MD 09/18/2013 4:12 PM  DISCLAIMER:  This note was dictated with voice recognition software.  Similar sounding words can inadvertently be transcribed inaccurately and may not be corrected upon review.

## 2013-10-04 LAB — CA 125

## 2013-10-15 ENCOUNTER — Other Ambulatory Visit (HOSPITAL_COMMUNITY): Payer: Self-pay | Admitting: Oncology

## 2013-11-02 NOTE — H&P (Signed)
  NTS SOAP Note  Vital Signs:  Vitals as of: 62/37/6283: Systolic 151: Diastolic 85: Heart Rate 63: Temp 96.59F: Height 11ft 2in: Weight 233Lbs 0 Ounces: BMI 42.62  BMI : 42.62 kg/m2  Subjective: This 63 year old female presents forscheduling of portacath removal.  Was placed for ovarian cancer.  Has finished chemotherapy.   Review of Symptoms:  Constitutional:unremarkable   Head:unremarkable Eyes:unremarkable   Nose/Mouth/Throat:unremarkable Cardiovascular:  unremarkable Respiratory:unremarkable Gastrointestinal:  unremarkable   Genitourinary:unremarkable   Musculoskeletal:unremarkable Skin:unremarkable Hematolgic/Lymphatic:unremarkable   Allergic/Immunologic:unremarkable   Past Medical History:  Reviewed  Past Medical History  Surgical History: TAH-BSO Medical Problems: ovarian cancer,  HTN  Allergies:  nkda Medications: bisoprolol,  kcl,  valsartan,  baby asa   Social History:Reviewed  Social History  Preferred Language: English Race:  White Ethnicity: Not Hispanic / Latino Age: 7 year Marital Status:  M Alcohol: no   Smoking Status: Never smoker reviewed on 11/01/2013 Functional Status reviewed on 11/01/2013 ------------------------------------------------ Bathing: Normal Cooking: Normal Dressing: Normal Driving: Normal Eating: Normal Managing Meds: Normal Oral Care: Normal Shopping: Normal Toileting: Normal Transferring: Normal Walking: Normal Cognitive Status reviewed on 11/01/2013 ------------------------------------------------ Attention: Normal Decision Making: Normal Language: Normal Memory: Normal Motor: Normal Perception: Normal Problem Solving: Normal Visual and Spatial: Normal   Family History:Reviewed  Family Health History Mother  Father, Deceased; Kidney or renal cancer;     Objective Information: General:Well appearing, well nourished in no distress. Portacath in place right upper  chest Heart:RRR, no murmur or gallop.  Normal S1, S2.  No S3, S4.  Lungs:  CTA bilaterally, no wheezes, rhonchi, rales.  Breathing unlabored.  Assessment:Ovarian cancer,  finished with chemotherapy  Diagnoses: V10.43  Z85.43 History of malignant neoplasm of ovary (Personal history of malignant neoplasm of ovary)  Procedures: 76160 - OFFICE OUTPATIENT NEW 20 MINUTES    Plan:  Scheduled for removal of portacath in minor procedure room on 11/21/13.   Patient Education:Alternative treatments to surgery were discussed with patient (and family).  Risks and benefits  of procedure were fully explained to the patient (and family) who gave informed consent. Patient/family questions were addressed.  Follow-up:Pending Surgery

## 2013-11-21 ENCOUNTER — Encounter (HOSPITAL_COMMUNITY)
Admission: RE | Disposition: A | Discharge status: Home / Self Care | Payer: Self-pay | Source: Ambulatory Visit | Attending: General Surgery

## 2013-11-21 ENCOUNTER — Ambulatory Visit (HOSPITAL_COMMUNITY)
Admission: RE | Admit: 2013-11-21 | Discharge: 2013-11-21 | Disposition: A | Discharge status: Home / Self Care | Payer: BC Managed Care – PPO | Source: Ambulatory Visit | Attending: General Surgery | Admitting: General Surgery

## 2013-11-21 ENCOUNTER — Encounter (HOSPITAL_COMMUNITY): Payer: Self-pay | Admitting: *Deleted

## 2013-11-21 DIAGNOSIS — Z8543 Personal history of malignant neoplasm of ovary: Secondary | ICD-10-CM | POA: Diagnosis not present

## 2013-11-21 DIAGNOSIS — G620 Drug-induced polyneuropathy: Secondary | ICD-10-CM | POA: Insufficient documentation

## 2013-11-21 DIAGNOSIS — T451X5A Adverse effect of antineoplastic and immunosuppressive drugs, initial encounter: Secondary | ICD-10-CM | POA: Diagnosis not present

## 2013-11-21 DIAGNOSIS — Z452 Encounter for adjustment and management of vascular access device: Secondary | ICD-10-CM | POA: Diagnosis present

## 2013-11-21 DIAGNOSIS — I10 Essential (primary) hypertension: Secondary | ICD-10-CM | POA: Insufficient documentation

## 2013-11-21 HISTORY — PX: PORT-A-CATH REMOVAL: SHX5289

## 2013-11-21 SURGERY — MINOR REMOVAL PORT-A-CATH
Anesthesia: LOCAL | Laterality: Right

## 2013-11-21 MED ORDER — LIDOCAINE-EPINEPHRINE (PF) 1 %-1:200000 IJ SOLN
INTRAMUSCULAR | Status: AC
  Filled 2013-11-21: qty 10

## 2013-11-21 MED ORDER — LIDOCAINE HCL (PF) 1 % IJ SOLN
INTRAMUSCULAR | Status: DC | PRN
  Administered 2013-11-21: 8 mg via SUBCUTANEOUS

## 2013-11-21 SURGICAL SUPPLY — 20 items
ADH SKN CLS APL DERMABOND .7 (GAUZE/BANDAGES/DRESSINGS) ×1
CLOTH BEACON ORANGE TIMEOUT ST (SAFETY) ×2 IMPLANT
DECANTER SPIKE VIAL GLASS SM (MISCELLANEOUS) ×2 IMPLANT
DERMABOND ADVANCED (GAUZE/BANDAGES/DRESSINGS) ×1
DERMABOND ADVANCED .7 DNX12 (GAUZE/BANDAGES/DRESSINGS) ×1 IMPLANT
DRAPE PROXIMA HALF (DRAPES) ×2 IMPLANT
DURAPREP 6ML APPLICATOR 50/CS (WOUND CARE) ×2 IMPLANT
ELECT REM PT RETURN 9FT ADLT (ELECTROSURGICAL) ×2
ELECTRODE REM PT RTRN 9FT ADLT (ELECTROSURGICAL) ×1 IMPLANT
GLOVE SURG SS PI 7.5 STRL IVOR (GLOVE) ×2 IMPLANT
GOWN STRL REUS W/TWL LRG LVL3 (GOWN DISPOSABLE) ×2 IMPLANT
NDL HYPO 25X1 1.5 SAFETY (NEEDLE) ×1 IMPLANT
NEEDLE HYPO 25X1 1.5 SAFETY (NEEDLE) ×2 IMPLANT
PENCIL HANDSWITCHING (ELECTRODE) ×2 IMPLANT
SPONGE GAUZE 2X2 8PLY STRL LF (GAUZE/BANDAGES/DRESSINGS) ×2 IMPLANT
SUT VIC AB 3-0 SH 27 (SUTURE) ×2
SUT VIC AB 3-0 SH 27X BRD (SUTURE) ×1 IMPLANT
SUT VIC AB 4-0 PS2 27 (SUTURE) ×2 IMPLANT
SYR CONTROL 10ML LL (SYRINGE) ×2 IMPLANT
TOWEL OR 17X26 4PK STRL BLUE (TOWEL DISPOSABLE) ×2 IMPLANT

## 2013-11-21 NOTE — Interval H&P Note (Signed)
History and Physical Interval Note:  11/21/2013 9:40 AM  Sarah Walker  has presented today for surgery, with the diagnosis of history of ovarian cancer  The various methods of treatment have been discussed with the patient and family. After consideration of risks, benefits and other options for treatment, the patient has consented to  Procedure(s): MINOR REMOVAL PORT-A-CATH (Right) as a surgical intervention .  The patient's history has been reviewed, patient examined, no change in status, stable for surgery.  I have reviewed the patient's chart and labs.  Questions were answered to the patient's satisfaction.     Aviva Signs A

## 2013-11-21 NOTE — Discharge Instructions (Signed)
Apply ice to wound as needed for swelling.

## 2013-11-21 NOTE — Op Note (Signed)
Patient:  Sarah Walker  DOB:  [birth year]  MRN:  [id number]   Preop Diagnosis:  Ovarian carcinoma, finished with chemotherapy  Postop Diagnosis:  same  Procedure:   Port-A-Cath removal  Surgeon:  Aviva Signs, M.D.  Anes:  local  Indications:  Patient is a 63 year old white female with ovarian carcinoma who has finished with her chemotherapy. She now presents for Port-A-Cath removal. The risks and benefits of the procedure were fully explained to the patient, who gave informed consent.  Procedure note:  The patient was placed the supine position. The right upper chest was prepped and draped using the usual sterile technique with DuraPrep. Surgical site confirmation was performed. 1% Xylocaine with epinephrine was used for local anesthesia.  The incision was made to the previous incision site. The dissection was taken down to the port. The Port-A-Cath was removed in total without difficulty. It was disposed of. No abnormal bleeding was noted. The subcutaneous layer was reapproximated using 3-0 Vicryl interrupted suture. The skin was closed using a 4-0 Vicryl subcutaneous Dermabond was then applied.  All tape and needle counts were correct at the end of the procedure. The patient was discharged from PACU in stable condition.  Complications:  none  EBL:  minimal  Specimen:  none

## 2013-11-22 ENCOUNTER — Encounter (HOSPITAL_COMMUNITY): Payer: Self-pay | Admitting: General Surgery

## 2014-01-15 ENCOUNTER — Other Ambulatory Visit (HOSPITAL_COMMUNITY): Payer: Self-pay | Admitting: Oncology

## 2014-03-15 ENCOUNTER — Other Ambulatory Visit (HOSPITAL_COMMUNITY): Payer: Self-pay

## 2014-03-15 DIAGNOSIS — C561 Malignant neoplasm of right ovary: Secondary | ICD-10-CM

## 2014-03-19 ENCOUNTER — Encounter (HOSPITAL_COMMUNITY): Payer: Medicare Other | Attending: Hematology & Oncology

## 2014-03-19 ENCOUNTER — Encounter (HOSPITAL_COMMUNITY): Payer: Self-pay | Admitting: Oncology

## 2014-03-19 ENCOUNTER — Encounter (HOSPITAL_COMMUNITY): Payer: 59 | Attending: Oncology | Admitting: Oncology

## 2014-03-19 VITALS — BP 142/76 | HR 64 | Temp 97.9°F | Resp 18 | Wt 232.2 lb

## 2014-03-19 DIAGNOSIS — C561 Malignant neoplasm of right ovary: Secondary | ICD-10-CM

## 2014-03-19 DIAGNOSIS — G622 Polyneuropathy due to other toxic agents: Secondary | ICD-10-CM | POA: Diagnosis not present

## 2014-03-19 DIAGNOSIS — G629 Polyneuropathy, unspecified: Secondary | ICD-10-CM

## 2014-03-19 LAB — CBC WITH DIFFERENTIAL/PLATELET
BASOS ABS: 0 10*3/uL (ref 0.0–0.1)
Basophils Relative: 0 % (ref 0–1)
EOS PCT: 3 % (ref 0–5)
Eosinophils Absolute: 0.2 10*3/uL (ref 0.0–0.7)
HCT: 43.1 % (ref 36.0–46.0)
Hemoglobin: 14.2 g/dL (ref 12.0–15.0)
LYMPHS PCT: 45 % (ref 12–46)
Lymphs Abs: 2.3 10*3/uL (ref 0.7–4.0)
MCH: 33.4 pg (ref 26.0–34.0)
MCHC: 32.9 g/dL (ref 30.0–36.0)
MCV: 101.4 fL — ABNORMAL HIGH (ref 78.0–100.0)
Monocytes Absolute: 0.4 10*3/uL (ref 0.1–1.0)
Monocytes Relative: 7 % (ref 3–12)
NEUTROS PCT: 45 % (ref 43–77)
Neutro Abs: 2.3 10*3/uL (ref 1.7–7.7)
PLATELETS: 192 10*3/uL (ref 150–400)
RBC: 4.25 MIL/uL (ref 3.87–5.11)
RDW: 14.1 % (ref 11.5–15.5)
WBC: 5.2 10*3/uL (ref 4.0–10.5)

## 2014-03-19 LAB — COMPREHENSIVE METABOLIC PANEL
ALT: 28 U/L (ref 0–35)
ANION GAP: 8 (ref 5–15)
AST: 25 U/L (ref 0–37)
Albumin: 4.2 g/dL (ref 3.5–5.2)
Alkaline Phosphatase: 72 U/L (ref 39–117)
BUN: 19 mg/dL (ref 6–23)
CHLORIDE: 105 mmol/L (ref 96–112)
CO2: 27 mmol/L (ref 19–32)
CREATININE: 0.75 mg/dL (ref 0.50–1.10)
Calcium: 9.2 mg/dL (ref 8.4–10.5)
GFR calc Af Amer: >90 mL/min (ref >90)
GFR calc non Af Amer: 87 mL/min — ABNORMAL LOW (ref >90)
GLUCOSE: 90 mg/dL (ref 70–99)
Potassium: 3.8 mmol/L (ref 3.5–5.1)
Sodium: 140 mmol/L (ref 135–145)
Total Bilirubin: 0.7 mg/dL (ref 0.3–1.2)
Total Protein: 7.3 g/dL (ref 6.0–8.3)

## 2014-03-19 NOTE — Assessment & Plan Note (Signed)
Persistent.  Grade 1.  Chemotherapy-induced.  Will refer patient to [facility].

## 2014-03-19 NOTE — Assessment & Plan Note (Addendum)
stage IA papillary serous cystadenocarcinoma status post surgical resection followed by adjuvant chemotherapy consisting of 6 cycles of carboplatin and paclitaxel from 07/15/2011 through 11/04/2011. At the time of presentation her cancer marker was elevated and remained elevated after surgery. It normalized with chemotherapy.  Labs today: CBC diff, CMET, CA 125.  Repeat labs in 6 months: CBC diff, CMET, CA 125.  She reports that she would like to establish local Gynecologic care so we will get her in locally with a Gynecologist.  Return in 6 months for follow-up.

## 2014-03-19 NOTE — Progress Notes (Signed)
Sarah Walker presented for labwork. Labs per MD order drawn via Peripheral Line 23 gauge needle inserted in right upper forearm  Good blood return present. Procedure without incident.  Needle removed intact. Patient tolerated procedure well.

## 2014-03-19 NOTE — Patient Instructions (Signed)
[place] at [facility] Discharge Instructions  RECOMMENDATIONS MADE BY THE CONSULTANT AND ANY TEST RESULTS WILL BE SENT TO YOUR REFERRING PHYSICIAN.  Return for lab work and office visit in 6 months.  Thank you for choosing [place] at [facility] to provide your oncology and hematology care.  To afford each patient quality time with our provider, please arrive at least 15 minutes before your scheduled appointment time.    You need to re-schedule your appointment should you arrive 10 or more minutes late.  We strive to give you quality time with our providers, and arriving late affects you and other patients whose appointments are after yours.  Also, if you no show three or more times for appointments you may be dismissed from the clinic at the providers discretion.     Again, thank you for choosing [facility].  Our hope is that these requests will decrease the amount of time that you wait before being seen by our physicians.       _____________________________________________________________  Should you have questions after your visit to [facility], please contact our office at [phone] between the hours of 8:30 a.m. and 4:30 p.m.  Voicemails left after 4:30 p.m. will not be returned until the following business day.  For prescription refill requests, have your pharmacy contact our office.

## 2014-03-19 NOTE — Progress Notes (Signed)
Sarah Walker, [place] [address]  Ovarian cancer on right - Plan: CBC with Differential, Comprehensive metabolic panel, CA 629  Peripheral neuropathy  CURRENT THERAPY: Surveillance per NCCN guidelines  INTERVAL HISTORY: Sarah Walker 64 y.o. female returns for followup of stage IA papillary serous cystadenocarcinoma status post surgical resection followed by adjuvant chemotherapy consisting of 6 cycles of carboplatin and paclitaxel from 07/15/2011 through 11/04/2011. At the time of presentation her cancer marker was elevated and remained elevated after surgery. It normalized with chemotherapy.   This lady's presentation in May of 5284 was complicated by pulmonary emboli. An IVC filter was placed initially prior surgery and was removed in December 2013. She has now been on anti-coagulation therapy since 05/10/2011. She has been very therapeutic the entire year essentially. Anticoagulation was discontinued following one year's worth of anticoagulation.     Ovarian cancer on right   05/25/2011 Surgery Debulking surgery at [facility] in [facility]   05/25/2011 Initial Diagnosis Ovarian cancer   07/15/2011 - 11/04/2011 Chemotherapy Carboplatin/Pacliatexel x 6 cycles   12/01/2011 Remission CT scan negative for disease   05/15/2012 Imaging CT abd/pelvis- Negative for mass or adenopathy.    I personally reviewed and went over laboratory results with the patient.  The results are noted within this dictation.  She reports that she feels good.  She looks great.  She reports that it has been years since she had her last Gyn exam.  She would like to stay locally, having seen Dr. Marnette Burgess in the distant past.  I will refer her to Dr. Eure/Ferguson/Griffin for establishment of female care.    Oncologically, she denies any complaints and ROS questioning is negative.   Past Medical History  Diagnosis Date  . Allergic rhinitis   . Sinusitis   .  Hypertension   . Ovarian cancer   . Pulmonary embolism   . Peripheral neuropathy 08/26/2011    Chemotherapy-induced  . Anemia associated with chemotherapy 09/16/2011  . Bone spur of other site     left foot  . Ovarian cancer on right 06/17/2011    has Ovarian cancer on right; Hypertension; H/O hysterectomy with oophorectomy; and Peripheral neuropathy on her problem list.     is allergic to codeine and chicken allergy.  Ms. Mosquera had no medications administered during this visit.  Past Surgical History  Procedure Laterality Date  . Tonsillectomy      age 26's  . Cholecystectomy  1990  . Abdominal hysterectomy  05/25/2011    [place] bs&o and cancer staging  . Tubal ligation  1981  . Portacath placement  06/16/11    Done at [facility]  . Ivc filter  05/24/11  . Port-a-cath removal Right 11/21/2013    Procedure: MINOR REMOVAL PORT-A-CATH;  Surgeon: Jamesetta So, MD;  Location: [facility];  Service: General;  Laterality: Right;    Denies any headaches, dizziness, double vision, fevers, chills, night sweats, nausea, vomiting, diarrhea, constipation, chest pain, heart palpitations, shortness of breath, blood in stool, black tarry stool, urinary pain, urinary burning, urinary frequency, hematuria.   PHYSICAL EXAMINATION  ECOG PERFORMANCE STATUS: 0 - Asymptomatic  Filed Vitals:   03/19/14 1409  BP: 142/76  Pulse: 64  Temp: 97.9 F (36.6 C)  Resp: 18    GENERAL:alert, no distress, well nourished, well developed, comfortable, cooperative, obese and smiling SKIN: skin color, texture, turgor are normal, no rashes or significant lesions HEAD: Normocephalic, No masses, lesions,  tenderness or abnormalities EYES: normal, PERRLA, EOMI, Conjunctiva are pink and non-injected EARS: External ears normal OROPHARYNX:lips, buccal mucosa, and tongue normal and mucous membranes are moist  NECK: supple, no adenopathy, thyroid normal size, non-tender, without nodularity, no stridor, non-tender,  trachea midline LYMPH:  no palpable lymphadenopathy, no hepatosplenomegaly BREAST:not examined LUNGS: clear to auscultation and percussion HEART: regular rate & rhythm, no murmurs, no gallops, S1 normal and S2 normal ABDOMEN:abdomen soft, non-tender, obese, normal bowel sounds, no masses or organomegaly and no hepatosplenomegaly BACK: Back symmetric, no curvature., No CVA tenderness EXTREMITIES:less then 2 second capillary refill, no joint deformities, effusion, or inflammation, no edema, no skin discoloration, no clubbing, no cyanosis  NEURO: alert & oriented x 3 with fluent speech, no focal motor/sensory deficits, gait normal   LABORATORY DATA: CBC    Component Value Date/Time   WBC 6.2 09/12/2013 1354   RBC 4.00 09/12/2013 1354   HGB 13.5 09/12/2013 1354   HCT 40.3 09/12/2013 1354   PLT 206 09/12/2013 1354   MCV 100.8* 09/12/2013 1354   MCH 33.8 09/12/2013 1354   MCHC 33.5 09/12/2013 1354   RDW 14.2 09/12/2013 1354   LYMPHSABS 2.1 09/12/2013 1354   MONOABS 0.4 09/12/2013 1354   EOSABS 0.1 09/12/2013 1354   BASOSABS 0.0 09/12/2013 1354      Chemistry      Component Value Date/Time   NA 141 09/12/2013 1354   K 3.7 09/12/2013 1354   CL 103 09/12/2013 1354   CO2 26 09/12/2013 1354   BUN 17 09/12/2013 1354   CREATININE 0.73 09/12/2013 1354      Component Value Date/Time   CALCIUM 9.2 09/12/2013 1354   ALKPHOS 66 09/12/2013 1354   AST 16 09/12/2013 1354   ALT 18 09/12/2013 1354   BILITOT 0.3 09/12/2013 1354      CA-125: 20 (09/12/2013)  ASSESSMENT AND PLAN:  Ovarian cancer on right stage IA papillary serous cystadenocarcinoma status post surgical resection followed by adjuvant chemotherapy consisting of 6 cycles of carboplatin and paclitaxel from 07/15/2011 through 11/04/2011. At the time of presentation her cancer marker was elevated and remained elevated after surgery. It normalized with chemotherapy.  Labs today: CBC diff, CMET, CA 125.  Repeat labs in 6 months: CBC  diff, CMET, CA 125.  She reports that she would like to establish local Gynecologic care so we will get her in locally with a Gynecologist.  Return in 6 months for follow-up.   Peripheral neuropathy Persistent.  Grade 1.  Chemotherapy-induced.  Will refer patient to [facility]   THERAPY PLAN:  NCCN guidelines for surveillance of Stage I-IV Epithelial Ovarian Cancer/ Fallopian Tube Cancer/ Primary Peritoneal Cancer recommends the following:  A. Vists every 2-4 months for first 2 years, then 3-6 months for years 3-5, and then annually after 5 years.  B. Physical exam including pelvic exam  C. CA-125 or other tumor markers every visit if initially elevated.  D. Refer for genetic risk evaluation if not previously performed.   E. CBC and chemistry profile as indicated.   F. CT Chest/Abdomen/Pelvis, MRI, PET-CT, or PET (Category 2B for PET) as clinically indicated.  G. Chest X-ray as indicted.    All questions were answered. The patient knows to call the clinic with any problems, questions or concerns. We can certainly see the patient much sooner if necessary.  Patient and plan discussed with Dr. Ancil Linsey and she is in agreement with the aforementioned.   This note is electronically signed by: Robynn Pane 03/19/2014  2:29 PM

## 2014-03-20 LAB — CA 125: CA 125: 20.6 U/mL (ref 0.0–34.0)

## 2014-03-21 ENCOUNTER — Other Ambulatory Visit (HOSPITAL_COMMUNITY): Payer: Self-pay | Admitting: Oncology

## 2014-03-21 DIAGNOSIS — C561 Malignant neoplasm of right ovary: Secondary | ICD-10-CM

## 2014-04-03 ENCOUNTER — Other Ambulatory Visit (HOSPITAL_COMMUNITY): Payer: Self-pay | Admitting: Family Medicine

## 2014-04-03 DIAGNOSIS — Z1231 Encounter for screening mammogram for malignant neoplasm of breast: Secondary | ICD-10-CM

## 2014-04-10 DIAGNOSIS — M1712 Unilateral primary osteoarthritis, left knee: Secondary | ICD-10-CM | POA: Diagnosis not present

## 2014-04-17 ENCOUNTER — Ambulatory Visit (HOSPITAL_COMMUNITY): Payer: Medicare Other | Attending: Family Medicine

## 2014-04-17 ENCOUNTER — Encounter (HOSPITAL_COMMUNITY): Payer: Self-pay | Admitting: Physical Therapy

## 2014-04-17 ENCOUNTER — Ambulatory Visit (HOSPITAL_COMMUNITY): Payer: Medicare Other | Admitting: Physical Therapy

## 2014-04-17 ENCOUNTER — Other Ambulatory Visit (HOSPITAL_COMMUNITY): Payer: Self-pay | Admitting: Oncology

## 2014-04-17 ENCOUNTER — Encounter (HOSPITAL_COMMUNITY): Payer: Self-pay

## 2014-04-17 DIAGNOSIS — R202 Paresthesia of skin: Secondary | ICD-10-CM

## 2014-04-17 DIAGNOSIS — T451X5A Adverse effect of antineoplastic and immunosuppressive drugs, initial encounter: Secondary | ICD-10-CM

## 2014-04-17 DIAGNOSIS — R2689 Other abnormalities of gait and mobility: Secondary | ICD-10-CM

## 2014-04-17 DIAGNOSIS — M6281 Muscle weakness (generalized): Secondary | ICD-10-CM

## 2014-04-17 DIAGNOSIS — G620 Drug-induced polyneuropathy: Secondary | ICD-10-CM

## 2014-04-17 DIAGNOSIS — R531 Weakness: Secondary | ICD-10-CM

## 2014-04-17 DIAGNOSIS — R6889 Other general symptoms and signs: Secondary | ICD-10-CM

## 2014-04-17 NOTE — Therapy (Signed)
Sarah Walker, [place], [address] Phone: [phone]   Fax:  [phone]  Occupational Therapy Evaluation  Patient Details  Name: Sarah Walker MRN: [id number] Date of Birth: 11-20-1950 Referring Provider:  Baird Cancer, PA-C  Encounter Date: 04/17/2014      OT End of Session - 04/17/14 1625    Visit Number 1   Number of Visits 1   Authorization Type UHC   OT Start Time 1515   OT Stop Time 1535   OT Time Calculation (min) 20 min   Activity Tolerance Patient tolerated treatment well   Behavior During Therapy Sarah Walker for tasks assessed/performed      Past Medical History  Diagnosis Date  . Allergic rhinitis   . Sinusitis   . Hypertension   . Ovarian cancer   . Pulmonary embolism   . Peripheral neuropathy 08/26/2011    Chemotherapy-induced  . Anemia associated with chemotherapy 09/16/2011  . Bone spur of other site     left foot  . Ovarian cancer on right 06/17/2011    Past Surgical History  Procedure Laterality Date  . Tonsillectomy      age 36's  . Cholecystectomy  1990  . Abdominal hysterectomy  05/25/2011    [place] bs&o and cancer staging  . Tubal ligation  1981  . Portacath placement  06/16/11    Done at [facility]  . Ivc filter  05/24/11  . Port-a-cath removal Right 11/21/2013    Procedure: MINOR REMOVAL PORT-A-CATH;  Surgeon: Jamesetta So, MD;  Location: [facility];  Service: General;  Laterality: Right;    There were no vitals filed for this visit.  Visit Diagnosis:  Peripheral neuropathy due to chemotherapy - Plan: Ot plan of care cert/re-cert  Paresthesia of both hands - Plan: Ot plan of care cert/re-cert      Subjective Assessment - 04/17/14 1600    Subjective  S: I used to be way worse when I first stopped chemotherapy.    Pertinent History Patient is a 64 y/o female s/p Stage1A papillary serous cystadenocarcinoma. Pt finished chemotherapy in 2013. Patient presents to therapy clinic  with complaints of bilateral peripheral neuropathy in hands and feet. Dr. Sheldon Silvan has referred patient to ocupational therapy for evaluation and treatment.    Special Tests FACIT-F TOI: 79, FACIT-G Total score: 87, FACIT-F Total score: 127, TUG scores: 15.3", 13.8", 13.8" Fatigue VAS: 5,10,5 Pain VAS: 0,10,8 Distress score: 5,10,5   Patient Stated Goals To decrease nueropathy in hands.    Currently in Pain? No/denies           [facility] OT Assessment - 04/17/14 1519    Assessment   Diagnosis Bilateral hand peripheral neuropathy   Onset Date --  October 2013   Prior Therapy None   Precautions   Precautions None   Restrictions   Weight Bearing Restrictions No   Balance Screen   Has the patient fallen in the past 6 months No   Home  Environment   Family/patient expects to be discharged to: Private residence   Prior Function   Level of Independence Independent with basic ADLs;Independent with gait;Independent with transfers   Vocation Full time employment   Psychologist, educational    Leisure teaching piano, quilting, reading    ADL   ADL comments Difficulty feeling items that she holds onto. Pt states that she has good days and bad days. Previously, patient states that her nueropathy was not as bad  as it was.   Mobility   Mobility Status Independent   Written Expression   Dominant Hand Right   Cognition   Overall Cognitive Status Within Functional Limits for tasks assessed   ROM / Strength   AROM / PROM / Strength Strength;AROM;PROM   AROM   Overall AROM Comments Bilateral shoulder, elbow, and wrist AROM WNL in all ranges.    Strength   Strength Assessment Site Hand;Shoulder   Right/Left Shoulder Right;Left   Right Shoulder Flexion 5/5   Right Shoulder ABduction 5/5   Right Shoulder Internal Rotation 5/5   Right Shoulder External Rotation 5/5   Left Shoulder Flexion 5/5   Left Shoulder ABduction 5/5   Left Shoulder Internal Rotation 5/5   Left Shoulder  External Rotation 5/5   Right/Left hand Right;Left   Grip (lbs) 60   Right Hand Lateral Pinch 14 lbs   Right Hand 3 Point Pinch 13 lbs   Grip (lbs) 60   Left Hand Lateral Pinch 13 lbs   Left Hand 3 Point Pinch 13 lbs                         OT Education - 05-09-2014 1624    Education provided Yes   Education Details Peripheral Nueropathy caused by Chemotherapy educational handout   Person(s) Educated Patient   Methods Explanation;Demonstration;Handout   Comprehension Verbalized understanding          OT Short Term Goals - 05-09-14 1635    OT SHORT TERM GOAL #1   Title Patient will be educated on peripheral nueropathy and techniques to increase safety awareness and functional performance as well as decrease pain during daily tasks.    Time 1   Period Days   Status Achieved                  Plan - 05-09-2014 1627    Clinical Impression Statement A: Patient is a 64 y/o female s/p Stage 1A papillary serous cystadenocarcinoma causing bilateral peripheral nueropathy in hands. Pt is currently experiencing mild numbness in all fingertips of bilateral hands. Pt reports that when she initally finished chemotherapy she was experiencing severe neuropathy in her hands. Since finishing chemotherapy she reports that her neuropathy has decreased. Pt is not experiencing any decrease in BUE strength such as grip or pinch strength. Coordination is WNL as patient is constantly typing for her job and playing the piano and quilting. Patient was given education on peripheral neuropathy and all questions answered. No further OT services needed at this time.    Rehab Potential Excellent   OT Frequency 1x / week   OT Duration --  1 week   OT Treatment/Interventions Patient/family education   Plan P: 1 time eval for patient education.   Consulted and Agree with Plan of Care Patient          G-Codes - May 09, 2014 1636    Functional Assessment Tool Used Grip and pinch strength    Functional Limitation Carrying, moving and handling objects   Carrying, Moving and Handling Objects Current Status [phone] 0 percent impaired, limited or restricted   Carrying, Moving and Handling Objects Goal Status ([id number]) 0 percent impaired, limited or restricted   Carrying, Moving and Handling Objects Discharge Status [phone]) 0 percent impaired, limited or restricted      Problem List Patient Active Problem List   Diagnosis Date Noted  . Peripheral neuropathy 08/26/2011  . Ovarian cancer on right 06/17/2011  .  Hypertension 06/17/2011  . H/O hysterectomy with oophorectomy 06/17/2011    Ailene Ravel, OTR/L,CBIS  [phone]  04/17/2014, 4:38 PM  [facility] [address] [place], [place], [address] Phone: [phone]   Fax:  [phone]

## 2014-04-17 NOTE — Therapy (Signed)
[place], [place], [address] Phone: [phone]   Fax:  [phone]  Physical Therapy Evaluation  Patient Details  Name: Sarah Walker MRN: [id number] Date of Birth: 1950-01-26 Referring Provider:  Baird Cancer, PA-C  Encounter Date: 04/17/2014      PT End of Session - 04/17/14 1647    Visit Number 1   Number of Visits 16   Date for PT Re-Evaluation 05/15/14   Authorization Type UHC   Authorization Time Period 04/17/14 to 06/17/14   Activity Tolerance Patient tolerated treatment well   Behavior During Therapy [facility] for tasks assessed/performed      Past Medical History  Diagnosis Date  . Allergic rhinitis   . Sinusitis   . Hypertension   . Ovarian cancer   . Pulmonary embolism   . Peripheral neuropathy 08/26/2011    Chemotherapy-induced  . Anemia associated with chemotherapy 09/16/2011  . Bone spur of other site     left foot  . Ovarian cancer on right 06/17/2011    Past Surgical History  Procedure Laterality Date  . Tonsillectomy      age 53's  . Cholecystectomy  1990  . Abdominal hysterectomy  05/25/2011    [place] bs&o and cancer staging  . Tubal ligation  1981  . Portacath placement  06/16/11    Done at [facility]  . Ivc filter  05/24/11  . Port-a-cath removal Right 11/21/2013    Procedure: MINOR REMOVAL PORT-A-CATH;  Surgeon: Jamesetta So, MD;  Location: [facility];  Service: General;  Laterality: Right;    There were no vitals filed for this visit.  Visit Diagnosis:  Peripheral neuropathy due to chemotherapy - Plan: PT plan of care cert/re-cert  Paresthesia of both feet - Plan: PT plan of care cert/re-cert  Weakness generalized - Plan: PT plan of care cert/re-cert  Proximal muscle weakness - Plan: PT plan of care cert/re-cert  Poor balance - Plan: PT plan of care cert/re-cert  Decreased functional activity tolerance - Plan: PT plan of care cert/re-cert      Subjective  Assessment - 04/17/14 1645    Subjective Good days and bad days. Doesn't really note fatigue unless she goes out to longer distances like the store. No real pain. Does feel like she needs to sit down.    Pertinent History Diagnosed with cancer January 2013 with ovarian cancer; had hysterectomy and also went through 6 rounds of chemo afterwards    How long can you stand comfortably? 10 minutes    How long can you walk comfortably? 10-15 minutes    Patient Stated Goals to improve neuropathy   Currently in Pain? No/denies            [facility] PT Assessment - 04/17/14 1620    Assessment   Medical Diagnosis neuropathy s/p ovarian cancer treatments    Onset Date 08/16/13  approximate   Next MD Visit September 2016   Precautions   Precautions None   Restrictions   Weight Bearing Restrictions No   Prior Function   Level of Independence Independent with basic ADLs;Independent with gait;Independent with transfers   Vocation Full time employment   Psychologist, educational    Leisure teaching piano, quilting, reading    Observation/Other Assessments   Observations Facit-F 127; fatigue VAS 5,10,5; distress VAS 5,10,5; pain VAS 0,10,8  TUG 15.3 seconds, 13.8 seconds, 13.8 seconds    AROM   Right Hip External Rotation  36  Right Hip Internal Rotation  30   Left Hip External Rotation  35   Left Hip Internal Rotation  28   Right Ankle Dorsiflexion 12   Left Ankle Dorsiflexion 9   Strength   Right Hip Flexion 3/5   Right Hip Extension 3+/5   Right Hip ABduction 3+/5   Left Hip Flexion 3+/5   Left Hip Extension 3+/5   Left Hip ABduction 3/5   Right Knee Flexion 4-/5   Right Knee Extension 4-/5   Left Knee Flexion 4-/5   Left Knee Extension 4-/5   Right Ankle Dorsiflexion 4-/5   Left Ankle Dorsiflexion 4-/5   High Level Balance   High Level Balance Comments Single leg stance 11 seconds L, 6 seconds R                            PT Education - 04/17/14  1623    Education provided Yes   Education Details Prognosis with skilled PT services; PT plan of care moving forward; HEP; STAR program guidebook and pedometer provided    Person(s) Educated Patient   Methods Explanation;Demonstration;Handout   Comprehension Verbalized understanding;Need further instruction          PT Short Term Goals - 04/17/14 1631    PT SHORT TERM GOAL #1   Title Patient will demonstrate the ability to perform TUG test in 10 seconds or less on 3/3 attempts    Time 4   Period Weeks   Status New   PT SHORT TERM GOAL #2   Title Patient will demonstrate the ability to perform single leg stance for at least 15 seconds on both lower extremities    Time 4   Period Weeks   Status New   PT SHORT TERM GOAL #3   Title Patient to report that she is walking at least 2000 steps a day and is increasing the distance she is walking at an appropriate pace    Time 4   Period Weeks   Status New   PT SHORT TERM GOAL #4   Title Patient to be independent in consistently and correctly performing appropriate HEP    Time 4   Period Weeks   Status New           PT Long Term Goals - 04/17/14 1635    PT LONG TERM GOAL #1   Title Patient will report that she is walking at least 5000 steps a day and increased ease with tasks such as going to the store or going shopping in her community    Time 8   Period Weeks   Status New   PT LONG TERM GOAL #2   Title Patient will be able to perform single leg stance for at least 30 seconds on each leg    Time 8   Period Weeks   Status New   PT LONG TERM GOAL #3   Title Patient will demonstrate at least 4+/5 strength in bilateral lower extremities and proximal muscle groups    Time 8   Period Weeks   Status New   PT LONG TERM GOAL #4   Title Patient will demonstrate at least 45 degrees of bilateral hip ER and at least 40 degrees of bilateral hip IR   Time 8   Period Weeks   Status New   PT LONG TERM GOAL #5   Title Patient will be  able to perform functional tasks in her  community and attend events in her community with minimal fatigue and no concerns regarding loss of balance    Time 8   Period Weeks   Status New               Plan - 04/17/14 1647    Clinical Impression Statement Patient presents with bilateral lower extremity and proximal muscle weakness, reduced bilateral hip and ankle range of motion, reduced functional activity and gait tolerance, impaired functional balance skills, and reduced overall ability to perform functional tasks. Patient's main complaint is her peripheral neuropathy however she does not appear to realize the full extent of her limitations regarding functional activity tolerance and gait tolerance at this time. Patient was given STAR guidebook and pedometer at end of session. Patient will benefit from skilled PT services in order to address her functional deficits as well as to assist her in reaching an optimal level of function.    Pt will benefit from skilled therapeutic intervention in order to improve on the following deficits Decreased coordination;Improper body mechanics;Impaired flexibility;Decreased endurance;Impaired sensation;Postural dysfunction;Decreased activity tolerance;Obesity;Decreased balance;Decreased mobility;Decreased strength   Rehab Potential Good   PT Frequency 2x / week   PT Duration 8 weeks   PT Treatment/Interventions ADLs/Self Care Home Management;Gait training;Neuromuscular re-education;Stair training;Functional mobility training;Patient/family education;Therapeutic activities;Therapeutic exercise;Manual techniques;Energy conservation;Balance training   PT Next Visit Plan Functional strengthening and stretching; balance exercises; activity tolerance; review HEP and goals    PT Home Exercise Plan bridges, sidelying hip abduction, standing hip extension, tandem stance    Consulted and Agree with Plan of Care Patient         Problem List Patient Active  Problem List   Diagnosis Date Noted  . Peripheral neuropathy 08/26/2011  . Ovarian cancer on right 06/17/2011  . Hypertension 06/17/2011  . H/O hysterectomy with oophorectomy 06/17/2011    Deniece Ree PT, DPT [place] [address] [place], [place], [address] Phone: [phone]   Fax:  [phone]

## 2014-04-17 NOTE — Patient Instructions (Signed)
Bridge   Lie back, legs bent. Inhale, pressing hips up. Keeping ribs in, lengthen lower back. Exhale, rolling down along spine from top. Repeat _10___ times. Do __2Abduction Lift   Lie on your side with your bottom leg bent and top leg straight.Keep your toes facing the wall and do not let them roll out to face the ceiling.   Repeat __10__ times each leg. Do __2__ sessions per day.  EXTENSION: Standing (Active)   Stand, both feet flat. Draw right leg behind body as far as possible.1 Complete ___ sets of __10_ repetitions. Perform __2_ sessions per day.  http://gtsc.exer.us/77   Copyright  VHI. All rights reserved.   Tandem Stance   Right foot in front of left, heel touching toe both feet "straight ahead". Stand on Foot Triangle of Support with both feet. Balance in this position __10_ seconds or as long as you can.  Do with left foot in front of right. Do 4 times twice a day.   Copyright  VHI. All rights reserved.

## 2014-04-17 NOTE — Patient Instructions (Signed)
Informational booklet from [facility] regarding Peripheral Neuropathy caused by Chemotherapy - See scan

## 2014-04-24 ENCOUNTER — Ambulatory Visit (HOSPITAL_COMMUNITY): Payer: Medicare Other | Admitting: Physical Therapy

## 2014-04-24 DIAGNOSIS — R2689 Other abnormalities of gait and mobility: Secondary | ICD-10-CM

## 2014-04-24 DIAGNOSIS — G620 Drug-induced polyneuropathy: Secondary | ICD-10-CM | POA: Diagnosis not present

## 2014-04-24 DIAGNOSIS — T451X5A Adverse effect of antineoplastic and immunosuppressive drugs, initial encounter: Secondary | ICD-10-CM | POA: Diagnosis not present

## 2014-04-24 DIAGNOSIS — R202 Paresthesia of skin: Secondary | ICD-10-CM | POA: Diagnosis not present

## 2014-04-24 DIAGNOSIS — M6281 Muscle weakness (generalized): Secondary | ICD-10-CM

## 2014-04-24 DIAGNOSIS — R531 Weakness: Secondary | ICD-10-CM

## 2014-04-24 DIAGNOSIS — R6889 Other general symptoms and signs: Secondary | ICD-10-CM

## 2014-04-24 NOTE — Therapy (Signed)
[place], [place], [address] Phone: [phone]   Fax:  [phone]  Physical Therapy Treatment  Patient Details  Name: Sarah Walker MRN: [id number] Date of Birth: 16-Sep-1950 Referring Provider:  Baird Cancer, PA-C  Encounter Date: 04/24/2014      PT End of Session - 04/24/14 1642    Visit Number 2   Number of Visits 16   Date for PT Re-Evaluation 05/15/14   Authorization Type UHC   Authorization Time Period 04/17/14 to 06/17/14   PT Start Time 1518   PT Stop Time 1602   PT Time Calculation (min) 44 min   Activity Tolerance Patient tolerated treatment well   Behavior During Therapy [facility] for tasks assessed/performed      Past Medical History  Diagnosis Date  . Allergic rhinitis   . Sinusitis   . Hypertension   . Ovarian cancer   . Pulmonary embolism   . Peripheral neuropathy 08/26/2011    Chemotherapy-induced  . Anemia associated with chemotherapy 09/16/2011  . Bone spur of other site     left foot  . Ovarian cancer on right 06/17/2011    Past Surgical History  Procedure Laterality Date  . Tonsillectomy      age 65's  . Cholecystectomy  1990  . Abdominal hysterectomy  05/25/2011    [place] bs&o and cancer staging  . Tubal ligation  1981  . Portacath placement  06/16/11    Done at [facility]  . Ivc filter  05/24/11  . Port-a-cath removal Right 11/21/2013    Procedure: MINOR REMOVAL PORT-A-CATH;  Surgeon: Jamesetta So, MD;  Location: [facility];  Service: General;  Laterality: Right;    There were no vitals filed for this visit.  Visit Diagnosis:  Paresthesia of both feet  Weakness generalized  Peripheral neuropathy due to chemotherapy  Proximal muscle weakness  Poor balance  Decreased functional activity tolerance      Subjective Assessment - 04/24/14 1638    Subjective Patient states she is doing well today, no pain and states that the last session really energized her    Pertinent History Diagnosed with cancer January 2013 with ovarian cancer; had hysterectomy and also went through 6 rounds of chemo afterwards                          [facility] Adult PT Treatment/Exercise - 04/24/14 0001    Ambulation/Gait   Ambulation/Gait Yes   Ambulation/Gait Assistance 7: Independent   Ambulation Distance (Feet) --  234ft x4   Gait Comments encouraged to walk as quickly as possible    Knee/Hip Exercises: Stretches   Active Hamstring Stretch 2 reps;30 seconds   Active Hamstring Stretch Limitations 14 inch box    Piriformis Stretch 2 reps;30 seconds   Piriformis Stretch Limitations supine    Gastroc Stretch 3 reps;30 seconds   Gastroc Stretch Limitations slantboard    Knee/Hip Exercises: Standing   Heel Raises 1 set;15 reps   Heel Raises Limitations floor    Forward Lunges Both;1 set;10 reps   Forward Lunges Limitations 6 inch box    Other Standing Knee Exercises Sidestepping approx 2x77ft for hip ABD endurance    Other Standing Knee Exercises Sit to stand with slow eccentric lower 1x10             Balance Exercises - 04/24/14 1641    Balance Exercises: Standing   Standing Eyes Closed Narrow  base of support (BOS);Foam/compliant surface;4 reps;10 secs   Tandem Stance Eyes open;Foam/compliant surface;3 reps;15 secs   Rockerboard Lateral  approx 3 minutes, no HHA    Tandem Gait Forward;4 reps  4x49ft approximately            PT Education - 04/24/14 1642    Education provided Yes   Education Details provided with copy of initial evaluation; reviewed short and long term goals    Person(s) Educated Patient   Methods Explanation   Comprehension Verbalized understanding          PT Short Term Goals - 04/17/14 1631    PT SHORT TERM GOAL #1   Title Patient will demonstrate the ability to perform TUG test in 10 seconds or less on 3/3 attempts    Time 4   Period Weeks   Status New   PT SHORT TERM GOAL #2   Title Patient will  demonstrate the ability to perform single leg stance for at least 15 seconds on both lower extremities    Time 4   Period Weeks   Status New   PT SHORT TERM GOAL #3   Title Patient to report that she is walking at least 2000 steps a day and is increasing the distance she is walking at an appropriate pace    Time 4   Period Weeks   Status New   PT SHORT TERM GOAL #4   Title Patient to be independent in consistently and correctly performing appropriate HEP    Time 4   Period Weeks   Status New           PT Long Term Goals - 04/17/14 1635    PT LONG TERM GOAL #1   Title Patient will report that she is walking at least 5000 steps a day and increased ease with tasks such as going to the store or going shopping in her community    Time 8   Period Weeks   Status New   PT LONG TERM GOAL #2   Title Patient will be able to perform single leg stance for at least 30 seconds on each leg    Time 8   Period Weeks   Status New   PT LONG TERM GOAL #3   Title Patient will demonstrate at least 4+/5 strength in bilateral lower extremities and proximal muscle groups    Time 8   Period Weeks   Status New   PT LONG TERM GOAL #4   Title Patient will demonstrate at least 45 degrees of bilateral hip ER and at least 40 degrees of bilateral hip IR   Time 8   Period Weeks   Status New   PT LONG TERM GOAL #5   Title Patient will be able to perform functional tasks in her community and attend events in her community with minimal fatigue and no concerns regarding loss of balance    Time 8   Period Weeks   Status New               Plan - 04/24/14 1643    Clinical Impression Statement Initiated functional strengthening, balance tasks, and functional activity tolerance tasks today. Patient appeared to tolerate session well with minimal cues for form, one rest break. Patient states that she is enjoying getting mobilie since she has been sick the past few years. Educated that [place] may have some  delayed muscle soreness the next couple of days due to increase in activity.  Pt will benefit from skilled therapeutic intervention in order to improve on the following deficits Decreased coordination;Improper body mechanics;Impaired flexibility;Decreased endurance;Impaired sensation;Postural dysfunction;Decreased activity tolerance;Obesity;Decreased balance;Decreased mobility;Decreased strength   Rehab Potential Good   PT Frequency 2x / week   PT Duration 8 weeks   PT Treatment/Interventions ADLs/Self Care Home Management;Gait training;Neuromuscular re-education;Stair training;Functional mobility training;Patient/family education;Therapeutic activities;Therapeutic exercise;Manual techniques;Energy conservation;Balance training   PT Next Visit Plan Continue addressing functional strength, balance, and functional activity tolerance    PT Home Exercise Plan bridges, sidelying hip abduction, standing hip extension, tandem stance    Consulted and Agree with Plan of Care Patient        Problem List Patient Active Problem List   Diagnosis Date Noted  . Peripheral neuropathy 08/26/2011  . Ovarian cancer on right 06/17/2011  . Hypertension 06/17/2011  . H/O hysterectomy with oophorectomy 06/17/2011    Deniece Ree PT, DPT [place] [address] [place], [place], [address] Phone: [phone]   Fax:  [phone]

## 2014-05-01 ENCOUNTER — Ambulatory Visit (HOSPITAL_COMMUNITY): Payer: Medicare Other | Admitting: Physical Therapy

## 2014-05-02 ENCOUNTER — Encounter (HOSPITAL_COMMUNITY): Payer: Medicare Other | Attending: Hematology & Oncology

## 2014-05-02 DIAGNOSIS — C561 Malignant neoplasm of right ovary: Secondary | ICD-10-CM

## 2014-05-02 NOTE — Progress Notes (Signed)
Labs drawn

## 2014-05-03 LAB — CA 125: CA 125: 22.1 U/mL (ref 0.0–34.0)

## 2014-05-08 ENCOUNTER — Ambulatory Visit (HOSPITAL_COMMUNITY)
Admission: RE | Admit: 2014-05-08 | Discharge: 2014-05-08 | Disposition: A | Discharge status: Home / Self Care | Payer: Medicare Other | Source: Ambulatory Visit | Attending: Family Medicine | Admitting: Family Medicine

## 2014-05-08 ENCOUNTER — Ambulatory Visit (HOSPITAL_COMMUNITY): Payer: Medicare Other | Attending: Family Medicine | Admitting: Physical Therapy

## 2014-05-08 DIAGNOSIS — G620 Drug-induced polyneuropathy: Secondary | ICD-10-CM | POA: Insufficient documentation

## 2014-05-08 DIAGNOSIS — Z1231 Encounter for screening mammogram for malignant neoplasm of breast: Secondary | ICD-10-CM | POA: Diagnosis not present

## 2014-05-08 DIAGNOSIS — R202 Paresthesia of skin: Secondary | ICD-10-CM | POA: Diagnosis not present

## 2014-05-08 DIAGNOSIS — T451X5A Adverse effect of antineoplastic and immunosuppressive drugs, initial encounter: Secondary | ICD-10-CM | POA: Insufficient documentation

## 2014-05-08 DIAGNOSIS — R531 Weakness: Secondary | ICD-10-CM

## 2014-05-08 DIAGNOSIS — R2689 Other abnormalities of gait and mobility: Secondary | ICD-10-CM

## 2014-05-08 DIAGNOSIS — R6889 Other general symptoms and signs: Secondary | ICD-10-CM

## 2014-05-08 DIAGNOSIS — M6281 Muscle weakness (generalized): Secondary | ICD-10-CM

## 2014-05-08 NOTE — Therapy (Signed)
[place], [place], [address] Phone: [phone]   Fax:  [phone]  Physical Therapy Treatment  Patient Details  Name: Sarah Walker MRN: [id number] Date of Birth: March 06, 1950 Referring Provider:  Baird Cancer, PA-C  Encounter Date: 05/08/2014      PT End of Session - 05/08/14 1557    Visit Number 3   Number of Visits 16   Date for PT Re-Evaluation 05/15/14   Authorization Type UHC   Authorization Time Period 04/17/14 to 06/17/14   PT Start Time 1515   PT Stop Time 1555   PT Time Calculation (min) 40 min   Activity Tolerance Patient tolerated treatment well   Behavior During Therapy [facility] for tasks assessed/performed      Past Medical History  Diagnosis Date  . Allergic rhinitis   . Sinusitis   . Hypertension   . Ovarian cancer   . Pulmonary embolism   . Peripheral neuropathy 08/26/2011    Chemotherapy-induced  . Anemia associated with chemotherapy 09/16/2011  . Bone spur of other site     left foot  . Ovarian cancer on right 06/17/2011    Past Surgical History  Procedure Laterality Date  . Tonsillectomy      age 27's  . Cholecystectomy  1990  . Abdominal hysterectomy  05/25/2011    [place] bs&o and cancer staging  . Tubal ligation  1981  . Portacath placement  06/16/11    Done at [facility]  . Ivc filter  05/24/11  . Port-a-cath removal Right 11/21/2013    Procedure: MINOR REMOVAL PORT-A-CATH;  Surgeon: Jamesetta So, MD;  Location: [facility];  Service: General;  Laterality: Right;    There were no vitals filed for this visit.  Visit Diagnosis:  Paresthesia of both feet  Weakness generalized  Peripheral neuropathy due to chemotherapy  Proximal muscle weakness  Poor balance  Decreased functional activity tolerance      Subjective Assessment - 05/08/14 1517    Subjective Patient states she is doing well today, but that she didn't come last week because she really tried to walk a  lot but that her ankles had swollen up and her feet were bothering her so much that she didn't think she could do therapy that week.    Pertinent History Diagnosed with cancer January 2013 with ovarian cancer; had hysterectomy and also went through 6 rounds of chemo afterwards                          [facility] Adult PT Treatment/Exercise - 05/08/14 0001    Ambulation/Gait   Ambulation/Gait Yes   Ambulation/Gait Assistance 7: Independent   Ambulation Distance (Feet) --  4x226ft   Gait Comments encouraged to walk as quickly as possible    Knee/Hip Exercises: Stretches   Active Hamstring Stretch 2 reps;30 seconds   Active Hamstring Stretch Limitations 14 inch box, 3 way    Piriformis Stretch 2 reps;30 seconds   Piriformis Stretch Limitations supine    Gastroc Stretch 3 reps;30 seconds   Gastroc Stretch Limitations slantboard    Knee/Hip Exercises: Standing   Heel Raises 1 set;20 reps   Heel Raises Limitations floor    Forward Lunges Both;1 set;15 reps   Forward Lunges Limitations 6 inch box    Other Standing Knee Exercises Sidestepping 2x49ft; hip extensions 1x10 bilateral    Other Standing Knee Exercises Sit to stand in staggered stance 1x5  each side              Balance Exercises - 05/08/14 1521    Balance Exercises: Standing   Standing Eyes Closed Narrow base of support (BOS);Foam/compliant surface;3 reps;10 secs;Other (comment)  eyes closed    Tandem Stance Eyes open;Eyes closed;Foam/compliant surface;4 reps;10 secs   Wall Bumps Shoulder  1x10    Rockerboard Other (comment)  balancing rockerboard x30 seconds AP and lateral            PT Education - 05/08/14 1557    Education provided Yes   Education Details education regarding good hydration throughout day; education regarding proper progression of activity   Person(s) Educated Patient   Methods Explanation   Comprehension Verbalized understanding          PT Short Term Goals - 04/17/14 1631     PT SHORT TERM GOAL #1   Title Patient will demonstrate the ability to perform TUG test in 10 seconds or less on 3/3 attempts    Time 4   Period Weeks   Status New   PT SHORT TERM GOAL #2   Title Patient will demonstrate the ability to perform single leg stance for at least 15 seconds on both lower extremities    Time 4   Period Weeks   Status New   PT SHORT TERM GOAL #3   Title Patient to report that she is walking at least 2000 steps a day and is increasing the distance she is walking at an appropriate pace    Time 4   Period Weeks   Status New   PT SHORT TERM GOAL #4   Title Patient to be independent in consistently and correctly performing appropriate HEP    Time 4   Period Weeks   Status New           PT Long Term Goals - 04/17/14 1635    PT LONG TERM GOAL #1   Title Patient will report that she is walking at least 5000 steps a day and increased ease with tasks such as going to the store or going shopping in her community    Time 8   Period Weeks   Status New   PT LONG TERM GOAL #2   Title Patient will be able to perform single leg stance for at least 30 seconds on each leg    Time 8   Period Weeks   Status New   PT LONG TERM GOAL #3   Title Patient will demonstrate at least 4+/5 strength in bilateral lower extremities and proximal muscle groups    Time 8   Period Weeks   Status New   PT LONG TERM GOAL #4   Title Patient will demonstrate at least 45 degrees of bilateral hip ER and at least 40 degrees of bilateral hip IR   Time 8   Period Weeks   Status New   PT LONG TERM GOAL #5   Title Patient will be able to perform functional tasks in her community and attend events in her community with minimal fatigue and no concerns regarding loss of balance    Time 8   Period Weeks   Status New               Plan - 05/08/14 1558    Clinical Impression Statement Patient stated that she had an appointment for a mammogram at 4:10 at [facility]; PT  decided to end session 5 minutes early to  assist patient in getting to appointment on time. Continued functional stretching, strengthening, balance exericses, and walking program. Patient states that she is feeling much better right after her therapy sessions but is feeling a little sore the next day with some muscle cramping; educated patient that this indicates current level of activity is still appropriate. Encouraged patient to remain active at home but not to the point that she is experiencing high levels of soreness and/or edema the next day.    Pt will benefit from skilled therapeutic intervention in order to improve on the following deficits Decreased coordination;Improper body mechanics;Impaired flexibility;Decreased endurance;Impaired sensation;Postural dysfunction;Decreased activity tolerance;Obesity;Decreased balance;Decreased mobility;Decreased strength   Rehab Potential Good   PT Frequency 2x / week   PT Duration 8 weeks   PT Treatment/Interventions ADLs/Self Care Home Management;Gait training;Neuromuscular re-education;Stair training;Functional mobility training;Patient/family education;Therapeutic activities;Therapeutic exercise;Manual techniques;Energy conservation;Balance training   PT Next Visit Plan Continue addressing functional strength, balance, and functional activity tolerance    PT Home Exercise Plan bridges, sidelying hip abduction, standing hip extension, tandem stance    Consulted and Agree with Plan of Care Patient        Problem List Patient Active Problem List   Diagnosis Date Noted  . Peripheral neuropathy 08/26/2011  . Ovarian cancer on right 06/17/2011  . Hypertension 06/17/2011  . H/O hysterectomy with oophorectomy 06/17/2011    Deniece Ree PT, DPT [place] [address] [place], [place], [address] Phone: [phone]   Fax:  [phone]

## 2014-05-10 ENCOUNTER — Ambulatory Visit (HOSPITAL_COMMUNITY): Payer: 59

## 2014-05-10 ENCOUNTER — Ambulatory Visit (HOSPITAL_COMMUNITY): Payer: Medicare Other | Admitting: Physical Therapy

## 2014-05-10 DIAGNOSIS — T451X5A Adverse effect of antineoplastic and immunosuppressive drugs, initial encounter: Secondary | ICD-10-CM

## 2014-05-10 DIAGNOSIS — R6889 Other general symptoms and signs: Secondary | ICD-10-CM

## 2014-05-10 DIAGNOSIS — R202 Paresthesia of skin: Secondary | ICD-10-CM

## 2014-05-10 DIAGNOSIS — G620 Drug-induced polyneuropathy: Secondary | ICD-10-CM | POA: Diagnosis not present

## 2014-05-10 DIAGNOSIS — M6281 Muscle weakness (generalized): Secondary | ICD-10-CM

## 2014-05-10 DIAGNOSIS — R2689 Other abnormalities of gait and mobility: Secondary | ICD-10-CM

## 2014-05-10 DIAGNOSIS — R531 Weakness: Secondary | ICD-10-CM

## 2014-05-10 NOTE — Therapy (Signed)
[place], [place], [address] Phone: [phone]   Fax:  [phone]  Physical Therapy Treatment  Patient Details  Name: Sarah Walker MRN: [id number] Date of Birth: Oct 27, 1950 Referring Provider:  [name], MD  Encounter Date: 05/10/2014      PT End of Session - 05/10/14 1606    Visit Number 4   Number of Visits 16   Date for PT Re-Evaluation 05/15/14   Authorization Type UHC   Authorization Time Period 04/17/14 to 06/17/14   PT Start Time 1516   PT Stop Time 1600   PT Time Calculation (min) 44 min   Activity Tolerance Patient tolerated treatment well   Behavior During Therapy [facility] for tasks assessed/performed      Past Medical History  Diagnosis Date  . Allergic rhinitis   . Sinusitis   . Hypertension   . Ovarian cancer   . Pulmonary embolism   . Peripheral neuropathy 08/26/2011    Chemotherapy-induced  . Anemia associated with chemotherapy 09/16/2011  . Bone spur of other site     left foot  . Ovarian cancer on right 06/17/2011    Past Surgical History  Procedure Laterality Date  . Tonsillectomy      age 59's  . Cholecystectomy  1990  . Abdominal hysterectomy  05/25/2011    [place] bs&o and cancer staging  . Tubal ligation  1981  . Portacath placement  06/16/11    Done at [facility]  . Ivc filter  05/24/11  . Port-a-cath removal Right 11/21/2013    Procedure: MINOR REMOVAL PORT-A-CATH;  Surgeon: Jamesetta So, MD;  Location: [facility];  Service: General;  Laterality: Right;    There were no vitals filed for this visit.  Visit Diagnosis:  Paresthesia of both feet  Weakness generalized  Peripheral neuropathy due to chemotherapy  Proximal muscle weakness  Poor balance  Decreased functional activity tolerance      Subjective Assessment - 05/10/14 1552    Subjective Patient states she is doing well today, no pain and feeling fairly good    Pertinent History Diagnosed with cancer  January 2013 with ovarian cancer; had hysterectomy and also went through 6 rounds of chemo afterwards                          [facility] Adult PT Treatment/Exercise - 05/10/14 0001    Knee/Hip Exercises: Stretches   Active Hamstring Stretch 3 reps;30 seconds   Active Hamstring Stretch Limitations stairs, 3 way    Piriformis Stretch 2 reps;30 seconds   Piriformis Stretch Limitations supine    Gastroc Stretch 3 reps;30 seconds   Gastroc Stretch Limitations slantboard    Knee/Hip Exercises: Aerobic   Stationary Bike Nustep seat 7 level 1 8 minutes at approx 45SPM    Knee/Hip Exercises: Standing   Forward Lunges Both;1 set;10 reps   Forward Lunges Limitations 4 inch box    Side Lunges Both;1 set;10 reps   Side Lunges Limitations 4 inch box    Forward Step Up Both;1 set;10 reps   Forward Step Up Limitations 4 inch step    Step Down Both;1 set;10 reps   Step Down Limitations 4 inch step    Other Standing Knee Exercises 3D hip excursions, split stance 1x10             Balance Exercises - 05/10/14 1554    Balance Exercises: Standing   Tandem Stance Eyes  open;Foam/compliant surface;3 reps;15 secs   SLS Eyes open;Solid surface;3 reps;15 secs   Standing, One Foot on a Step Eyes open;Foam/compliant surface;4 inch  bottom foot on foam pad    Other Standing Exercises Cone taps by 2 and 3 cones, 2x5 each leg            PT Education - 05/10/14 1605    Education provided Yes   Education Details education on possible delayed onset muscle soreness after increased amount of exercise this session   Person(s) Educated Patient   Methods Explanation   Comprehension Verbalized understanding          PT Short Term Goals - 04/17/14 1631    PT SHORT TERM GOAL #1   Title Patient will demonstrate the ability to perform TUG test in 10 seconds or less on 3/3 attempts    Time 4   Period Weeks   Status New   PT SHORT TERM GOAL #2   Title Patient will demonstrate the ability  to perform single leg stance for at least 15 seconds on both lower extremities    Time 4   Period Weeks   Status New   PT SHORT TERM GOAL #3   Title Patient to report that she is walking at least 2000 steps a day and is increasing the distance she is walking at an appropriate pace    Time 4   Period Weeks   Status New   PT SHORT TERM GOAL #4   Title Patient to be independent in consistently and correctly performing appropriate HEP    Time 4   Period Weeks   Status New           PT Long Term Goals - 04/17/14 1635    PT LONG TERM GOAL #1   Title Patient will report that she is walking at least 5000 steps a day and increased ease with tasks such as going to the store or going shopping in her community    Time 8   Period Weeks   Status New   PT LONG TERM GOAL #2   Title Patient will be able to perform single leg stance for at least 30 seconds on each leg    Time 8   Period Weeks   Status New   PT LONG TERM GOAL #3   Title Patient will demonstrate at least 4+/5 strength in bilateral lower extremities and proximal muscle groups    Time 8   Period Weeks   Status New   PT LONG TERM GOAL #4   Title Patient will demonstrate at least 45 degrees of bilateral hip ER and at least 40 degrees of bilateral hip IR   Time 8   Period Weeks   Status New   PT LONG TERM GOAL #5   Title Patient will be able to perform functional tasks in her community and attend events in her community with minimal fatigue and no concerns regarding loss of balance    Time 8   Period Weeks   Status New               Plan - 05/10/14 1606    Clinical Impression Statement Continued functional exercise, balance, stretching, and functional activity program today with increased overall activity. Patient tolerated session well although she did express some fatigue at end of duration on Nustep. Patient very pleasant and continues to progress well with skilled PT services.    Pt will benefit from skilled  therapeutic intervention  in order to improve on the following deficits Decreased coordination;Improper body mechanics;Impaired flexibility;Decreased endurance;Impaired sensation;Postural dysfunction;Decreased activity tolerance;Obesity;Decreased balance;Decreased mobility;Decreased strength   Rehab Potential Good   PT Frequency 2x / week   PT Duration 8 weeks   PT Treatment/Interventions ADLs/Self Care Home Management;Gait training;Neuromuscular re-education;Stair training;Functional mobility training;Patient/family education;Therapeutic activities;Therapeutic exercise;Manual techniques;Energy conservation;Balance training   PT Next Visit Plan Continue addressing functional strength, balance, and functional activity tolerance    PT Home Exercise Plan bridges, sidelying hip abduction, standing hip extension, tandem stance    Consulted and Agree with Plan of Care Patient        Problem List Patient Active Problem List   Diagnosis Date Noted  . Peripheral neuropathy 08/26/2011  . Ovarian cancer on right 06/17/2011  . Hypertension 06/17/2011  . H/O hysterectomy with oophorectomy 06/17/2011    Deniece Ree PT, DPT [place] [address] [place], [place], [address] Phone: [phone]   Fax:  [phone]

## 2014-05-15 ENCOUNTER — Ambulatory Visit (HOSPITAL_COMMUNITY): Payer: Medicare Other | Admitting: Physical Therapy

## 2014-05-15 DIAGNOSIS — M6281 Muscle weakness (generalized): Secondary | ICD-10-CM

## 2014-05-15 DIAGNOSIS — R202 Paresthesia of skin: Secondary | ICD-10-CM

## 2014-05-15 DIAGNOSIS — R2689 Other abnormalities of gait and mobility: Secondary | ICD-10-CM

## 2014-05-15 DIAGNOSIS — T451X5A Adverse effect of antineoplastic and immunosuppressive drugs, initial encounter: Secondary | ICD-10-CM

## 2014-05-15 DIAGNOSIS — R531 Weakness: Secondary | ICD-10-CM

## 2014-05-15 DIAGNOSIS — G620 Drug-induced polyneuropathy: Secondary | ICD-10-CM | POA: Diagnosis not present

## 2014-05-15 DIAGNOSIS — R6889 Other general symptoms and signs: Secondary | ICD-10-CM

## 2014-05-15 NOTE — Therapy (Signed)
[place], [place], [address] Phone: [phone]   Fax:  [phone]  Physical Therapy Treatment  Patient Details  Name: Sarah Walker MRN: [id number] Date of Birth: [birth year] Referring Provider:  Baird Cancer, PA-C  Encounter Date: 05/15/2014      PT End of Session - 05/15/14 1525    Visit Number 5   Number of Visits 16   Date for PT Re-Evaluation 06/12/14   Authorization Type UHC   Authorization Time Period 04/17/14 to 06/17/14   PT Start Time 1430   PT Stop Time 1512   PT Time Calculation (min) 42 min   Activity Tolerance Patient tolerated treatment well   Behavior During Therapy [facility] for tasks assessed/performed      Past Medical History  Diagnosis Date  . Allergic rhinitis   . Sinusitis   . Hypertension   . Ovarian cancer   . Pulmonary embolism   . Peripheral neuropathy 08/26/2011    Chemotherapy-induced  . Anemia associated with chemotherapy 09/16/2011  . Bone spur of other site     left foot  . Ovarian cancer on right 06/17/2011    Past Surgical History  Procedure Laterality Date  . Tonsillectomy      age 77's  . Cholecystectomy  1990  . Abdominal hysterectomy  05/25/2011    [place] bs&o and cancer staging  . Tubal ligation  1981  . Portacath placement  06/16/11    Done at [facility]  . Ivc filter  05/24/11  . Port-a-cath removal Right 11/21/2013    Procedure: MINOR REMOVAL PORT-A-CATH;  Surgeon: Jamesetta So, MD;  Location: [facility];  Service: General;  Laterality: Right;    There were no vitals filed for this visit.  Visit Diagnosis:  Paresthesia of both feet  Weakness generalized  Peripheral neuropathy due to chemotherapy  Proximal muscle weakness  Poor balance  Decreased functional activity tolerance      Subjective Assessment - 05/15/14 1440    Subjective Patient states she did well this weekend, no pain and continues to state that the PT workouts continue to give  her energy    Pertinent History Diagnosed with cancer January 2013 with ovarian cancer; had hysterectomy and also went through 6 rounds of chemo afterwards    How long can you stand comfortably? 5/11- 15 minutes    How long can you walk comfortably? 5/11- 20 minutes    Patient Stated Goals to improve neuropathy   Currently in Pain? No/denies            [facility] PT Assessment - 05/15/14 0001    Observation/Other Assessments   Observations Facit-F 147; Fact-G 99; Facit-F TOI 100; fatigue VAS 0/2/1; Pain VAS 0/5/3; Distress VAS 0/2/2  TUG 10.62. 10.12, 9.97   AROM   Right Hip External Rotation  40   Right Hip Internal Rotation  30   Left Hip External Rotation  40   Left Hip Internal Rotation  28   Right Ankle Dorsiflexion 12   Left Ankle Dorsiflexion 14   Strength   Right Hip Flexion 3+/5   Right Hip Extension 4-/5   Right Hip ABduction 4-/5   Left Hip Flexion 4-/5   Left Hip Extension 4-/5   Left Hip ABduction 3/5   Right Knee Flexion 4/5   Right Knee Extension 4/5   Left Knee Flexion 4-/5   Left Knee Extension 4/5   Right Ankle Dorsiflexion 4-/5   Left Ankle  Dorsiflexion 4-/5   High Level Balance   High Level Balance Comments Single leg stance 15 seconds each leg with intermittent fingertip touch                      [facility] Adult PT Treatment/Exercise - 05/15/14 0001    Knee/Hip Exercises: Aerobic   Stationary Bike Nustep seat 7 level 1 10 minutes                 PT Education - 05/15/14 1525    Education provided Yes   Education Details progress with skilled PT services, plan of care moving forward    Person(s) Educated Patient   Methods Explanation   Comprehension Verbalized understanding          PT Short Term Goals - 05/15/14 1530    PT SHORT TERM GOAL #1   Title Patient will demonstrate the ability to perform TUG test in 10 seconds or less on 3/3 attempts    Baseline 5/11- 10.62, 10.12, 9.97   Time 4   Period Weeks   Status On-going    PT SHORT TERM GOAL #2   Title Patient will demonstrate the ability to perform single leg stance for at least 15 seconds on both lower extremities    Baseline 5/11- able to do 15 second holds but needed intermittent finger tip support    Time 4   Period Weeks   Status On-going   PT SHORT TERM GOAL #3   Title Patient to report that she is walking at least 2000 steps a day and is increasing the distance she is walking at an appropriate pace    Baseline 5/11- not doing it every day due to work schedule, but on weekends is doing at least 2000 a day    Time 4   Period Weeks   Status On-going   PT [place] #4   Title Patient to be independent in consistently and correctly performing appropriate HEP    Time 4   Period Weeks   Status On-going           PT Long Term Goals - 05/15/14 1530    PT LONG TERM GOAL #1   Title Patient will report that she is walking at least 5000 steps a day and increased ease with tasks such as going to the store or going shopping in her community    Time 8   Period Weeks   Status On-going   PT LONG TERM GOAL #2   Title Patient will be able to perform single leg stance for at least 30 seconds on each leg    Time 8   Period Weeks   Status On-going   PT LONG TERM GOAL #3   Title Patient will demonstrate at least 4+/5 strength in bilateral lower extremities and proximal muscle groups    Time 8   Period Weeks   Status On-going   PT LONG TERM GOAL #4   Title Patient will demonstrate at least 45 degrees of bilateral hip ER and at least 40 degrees of bilateral hip IR   Time 8   Period Weeks   Status On-going   PT LONG TERM GOAL #5   Title Patient will be able to perform functional tasks in her community and attend events in her community with minimal fatigue and no concerns regarding loss of balance    Baseline 5/11- patient reports she continues to have fatigue, also reports she is not  worried about getting in crowds    Time 8   Period Weeks    Status On-going               Plan - 05/15/14 1526    Clinical Impression Statement Re-assessment performed today. Patient is improving but does show continued deficits in her functional activity tolerance, general proximal and bilateral lower extremity strength, balance and balance reaction skills, and reduced tolerance to extended periods of functional task performance. The patient says she has begun to feel a lot better since starting with skilled PT services but does continue to state that the neuropathy in her feet is not improving; patient has been educated regarding impact of PT on balance rather than reducing neuropathy. 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 The patient will continue to benefit from skilled PT services for approximately 4 more weeks at 2x/week in order to continue addressing these deficits and to assist her  in reaching an optimal level of function.    Pt will benefit from skilled therapeutic intervention in order to improve on the following deficits Decreased coordination;Improper body mechanics;Impaired flexibility;Decreased endurance;Impaired sensation;Postural dysfunction;Decreased activity tolerance;Obesity;Decreased balance;Decreased mobility;Decreased strength   Rehab Potential Good   PT Frequency 2x / week   PT Duration 4 weeks   PT Treatment/Interventions ADLs/Self Care Home Management;Gait training;Neuromuscular re-education;Stair training;Functional mobility training;Patient/family education;Therapeutic activities;Therapeutic exercise;Manual techniques;Energy conservation;Balance training   PT Next Visit Plan Continue addressing functional strength, balance, and functional activity tolerance    PT Home Exercise Plan bridges, sidelying hip abduction, standing hip extension, tandem stance    Consulted and  Agree with Plan of Care Patient        Problem List Patient Active Problem List   Diagnosis Date Noted  . Peripheral neuropathy 08/26/2011  . Ovarian cancer on right 06/17/2011  . Hypertension 06/17/2011  . H/O hysterectomy with oophorectomy 06/17/2011    Deniece Ree PT, DPT [place] [address] [place], [place], [address] Phone: [phone]   Fax:  [phone]

## 2014-05-17 ENCOUNTER — Ambulatory Visit (HOSPITAL_COMMUNITY): Payer: Medicare Other

## 2014-05-17 DIAGNOSIS — T451X5A Adverse effect of antineoplastic and immunosuppressive drugs, initial encounter: Secondary | ICD-10-CM | POA: Diagnosis not present

## 2014-05-17 DIAGNOSIS — G620 Drug-induced polyneuropathy: Secondary | ICD-10-CM

## 2014-05-17 DIAGNOSIS — R531 Weakness: Secondary | ICD-10-CM

## 2014-05-17 DIAGNOSIS — R202 Paresthesia of skin: Secondary | ICD-10-CM | POA: Diagnosis not present

## 2014-05-17 DIAGNOSIS — R2689 Other abnormalities of gait and mobility: Secondary | ICD-10-CM

## 2014-05-17 DIAGNOSIS — R6889 Other general symptoms and signs: Secondary | ICD-10-CM

## 2014-05-17 DIAGNOSIS — M6281 Muscle weakness (generalized): Secondary | ICD-10-CM

## 2014-05-17 NOTE — Therapy (Signed)
[place] [address] [place], [place], [address] Phone: [phone]   Fax:  [phone]  Physical Therapy Treatment  Patient Details  Name: Sarah Walker MRN: [id number] Date of Birth: 10-Dec-1950 Referring Provider:  [name], MD  Encounter Date: 05/17/2014      PT End of Session - 05/17/14 1646    Visit Number 6   Number of Visits 16   Date for PT Re-Evaluation 06/12/14   Authorization Type UHC   Authorization Time Period 04/17/14 to 06/17/14   PT Start Time 1603   PT Stop Time 1650   PT Time Calculation (min) 47 min   Equipment Utilized During Treatment Gait belt   Activity Tolerance Patient tolerated treatment well   Behavior During Therapy [facility] for tasks assessed/performed      Past Medical History  Diagnosis Date  . Allergic rhinitis   . Sinusitis   . Hypertension   . Ovarian cancer   . Pulmonary embolism   . Peripheral neuropathy 08/26/2011    Chemotherapy-induced  . Anemia associated with chemotherapy 09/16/2011  . Bone spur of other site     left foot  . Ovarian cancer on right 06/17/2011    Past Surgical History  Procedure Laterality Date  . Tonsillectomy      age 53's  . Cholecystectomy  1990  . Abdominal hysterectomy  05/25/2011    [place] bs&o and cancer staging  . Tubal ligation  1981  . Portacath placement  06/16/11    Done at [facility]  . Ivc filter  05/24/11  . Port-a-cath removal Right 11/21/2013    Procedure: MINOR REMOVAL PORT-A-CATH;  Surgeon: Jamesetta So, MD;  Location: [facility];  Service: General;  Laterality: Right;    There were no vitals filed for this visit.  Visit Diagnosis:  Paresthesia of both feet  Weakness generalized  Peripheral neuropathy due to chemotherapy  Proximal muscle weakness  Poor balance  Decreased functional activity tolerance  Paresthesia of both hands      Subjective Assessment - 05/17/14 1647    Subjective Pt stated she feels like she is making  improvements functionally but contines to c/o neuropathy and balance.   Currently in Pain? No/denies                [facility] Adult PT Treatment/Exercise - 05/17/14 0001    Knee/Hip Exercises: Standing   Heel Raises 1 set;15 reps   Heel Raises Limitations foam   Forward Lunges Both;1 set;10 reps   Forward Lunges Limitations 4 inch box    Side Lunges Both;1 set;10 reps   Side Lunges Limitations 4 inch box    Stairs 4RT reciprocal 7in 1 HR; cueing for eccentric control descending   SLS with Vectors 3x 5" Bil LE   Other Standing Knee Exercises 3D hip excursions, split stance 1x10             Balance Exercises - 05/17/14 1615    Balance Exercises: Standing   Tandem Stance Eyes open;Foam/compliant surface;2 reps;30 secs   SLS Eyes open;Solid surface;5 reps  Rt 8" and Lt 11" max of 5   Balance Beam Tandem, retro and sidestepping   Other Standing Exercises Cone taps by 3 then 5 cones, 2x5 each leg              PT Short Term Goals - 05/17/14 1647    PT SHORT TERM GOAL #1   Title Patient will demonstrate the ability to perform  TUG test in 10 seconds or less on 3/3 attempts    Status On-going   PT SHORT TERM GOAL #2   Title Patient will demonstrate the ability to perform single leg stance for at least 15 seconds on both lower extremities    Status On-going   PT SHORT TERM GOAL #3   Title Patient to report that she is walking at least 2000 steps a day and is increasing the distance she is walking at an appropriate pace    Status On-going   PT SHORT TERM GOAL #4   Title Patient to be independent in consistently and correctly performing appropriate HEP    Status On-going           PT Long Term Goals - 05/17/14 1728    PT LONG TERM GOAL #1   Title Patient will report that she is walking at least 5000 steps a day and increased ease with tasks such as going to the store or going shopping in her community    PT [place] #2   Title Patient will be able to perform  single leg stance for at least 30 seconds on each leg    PT LONG TERM GOAL #3   Title Patient will demonstrate at least 4+/5 strength in bilateral lower extremities and proximal muscle groups    PT LONG TERM GOAL #4   Title Patient will demonstrate at least 45 degrees of bilateral hip ER and at least 40 degrees of bilateral hip IR   PT LONG TERM GOAL #5   Title Patient will be able to perform functional tasks in her community and attend events in her community with minimal fatigue and no concerns regarding loss of balance                Plan - 05/17/14 1648    Clinical Impression Statement Session focus on improving functional strengthening, balance with gait mechanics and improving activity tolerance.  Began tandem and retro on balance beam with min assistance and cueing to improve spatial awareness.  Pt improving functional strengthening with ability to demonstrate reciprical pattern stairs with 1HR on 7in stairs with cueing to control decending stairs.  Increased resistance on Nustep for increased demand with  ability to complete 77 steps per minute.   PT Next Visit Plan Continue addressing functional strength, balance, and functional activity tolerance         Problem List Patient Active Problem List   Diagnosis Date Noted  . Peripheral neuropathy 08/26/2011  . Ovarian cancer on right 06/17/2011  . Hypertension 06/17/2011  . H/O hysterectomy with oophorectomy 06/17/2011   Ihor Austin, [place]; [place] #[id number] [phone]  Aldona Lento 05/17/2014, 5:29 PM  [place] [address] [place], [place], [address] Phone: [phone]   Fax:  [phone]

## 2014-05-22 ENCOUNTER — Ambulatory Visit (HOSPITAL_COMMUNITY): Payer: Medicare Other | Admitting: Physical Therapy

## 2014-05-22 DIAGNOSIS — G620 Drug-induced polyneuropathy: Secondary | ICD-10-CM

## 2014-05-22 DIAGNOSIS — T451X5A Adverse effect of antineoplastic and immunosuppressive drugs, initial encounter: Secondary | ICD-10-CM

## 2014-05-22 DIAGNOSIS — R2689 Other abnormalities of gait and mobility: Secondary | ICD-10-CM

## 2014-05-22 DIAGNOSIS — R202 Paresthesia of skin: Secondary | ICD-10-CM | POA: Diagnosis not present

## 2014-05-22 DIAGNOSIS — R531 Weakness: Secondary | ICD-10-CM

## 2014-05-22 DIAGNOSIS — M6281 Muscle weakness (generalized): Secondary | ICD-10-CM

## 2014-05-22 DIAGNOSIS — R6889 Other general symptoms and signs: Secondary | ICD-10-CM

## 2014-05-22 NOTE — Therapy (Signed)
[place] [address] [place], [place], [address] Phone: [phone]   Fax:  [phone]  Physical Therapy Treatment (Discharge)  Patient Details  Name: Sarah Walker MRN: [id number] Date of Birth: [birth year] Referring Provider:  Baird Cancer, PA-C  Encounter Date: 05/22/2014      PT End of Session - 05/22/14 1552    Visit Number 7   Number of Visits 16   Date for PT Re-Evaluation 06/12/14   Authorization Type UHC   Authorization Time Period 04/17/14 to 06/17/14      Past Medical History  Diagnosis Date  . Allergic rhinitis   . Sinusitis   . Hypertension   . Ovarian cancer   . Pulmonary embolism   . Peripheral neuropathy 08/26/2011    Chemotherapy-induced  . Anemia associated with chemotherapy 09/16/2011  . Bone spur of other site     left foot  . Ovarian cancer on right 06/17/2011    Past Surgical History  Procedure Laterality Date  . Tonsillectomy      age 37's  . Cholecystectomy  1990  . Abdominal hysterectomy  05/25/2011    [place] bs&o and cancer staging  . Tubal ligation  1981  . Portacath placement  06/16/11    Done at [facility]  . Ivc filter  05/24/11  . Port-a-cath removal Right 11/21/2013    Procedure: MINOR REMOVAL PORT-A-CATH;  Surgeon: Jamesetta So, MD;  Location: [facility];  Service: General;  Laterality: Right;    There were no vitals filed for this visit.  Visit Diagnosis:  Paresthesia of both feet  Weakness generalized  Peripheral neuropathy due to chemotherapy  Proximal muscle weakness  Poor balance  Decreased functional activity tolerance                       [facility] Adult PT Treatment/Exercise - 05/22/14 0001    Knee/Hip Exercises: Stretches   Active Hamstring Stretch 3 reps;30 seconds   Active Hamstring Stretch Limitations 14 inch box    Hip Flexor Stretch 2 reps;30 seconds   Hip Flexor Stretch Limitations 2nd step   Gastroc Stretch 3 reps;30 seconds   Gastroc  Stretch Limitations slantboard    Knee/Hip Exercises: Aerobic   Stationary Bike Nutep seat 7, level 2 10 minutes    Knee/Hip Exercises: Standing   Forward Lunges Both;1 set;10 reps   Forward Lunges Limitations 2 inch box    Side Lunges Both;1 set;10 reps   Side Lunges Limitations 2 inch box    Forward Step Up Both;1 set;15 reps   Forward Step Up Limitations 4 inch box    Other Standing Knee Exercises Standing Hip ABD and extension 1x15 each LE              Balance Exercises - 05/22/14 1524    Balance Exercises: Standing   Tandem Stance Eyes closed;Foam/compliant surface;3 reps;20 secs   Wall Bumps Shoulder  eyes closed 1x20   Other Standing Exercises Color cone tap combinations; cross-midline cone taps            PT Education - 05/22/14 1551    Education provided Yes   Education Details education regarding going to [facility] and coupon provided; educated regarding importance of staying active after DC from skilled PT services    Person(s) Educated Patient   Methods Explanation   Comprehension Verbalized understanding          PT Short Term Goals - 05/22/14 1601  PT SHORT TERM GOAL #1   Title Patient will demonstrate the ability to perform TUG test in 10 seconds or less on 3/3 attempts    Baseline 5/11- 10.62, 10.12, 9.97   Time 4   Period Weeks   Status On-going   PT SHORT TERM GOAL #2   Title Patient will demonstrate the ability to perform single leg stance for at least 15 seconds on both lower extremities    Baseline 5/11- able to do 15 second holds but needed intermittent finger tip support    Time 4   Period Weeks   Status On-going   PT SHORT TERM GOAL #3   Title Patient to report that she is walking at least 2000 steps a day and is increasing the distance she is walking at an appropriate pace    Baseline 5/11- not doing it every day due to work schedule, but on weekends is doing at least 2000 a day    Time 4   Period Weeks   Status On-going   PT [place] #4   Title Patient to be independent in consistently and correctly performing appropriate HEP    Time 4   Period Weeks   Status On-going           PT Long Term Goals - 05/22/14 1602    PT LONG TERM GOAL #1   Title Patient will report that she is walking at least 5000 steps a day and increased ease with tasks such as going to the store or going shopping in her community    Time 8   Period Weeks   Status On-going   PT LONG TERM GOAL #2   Title Patient will be able to perform single leg stance for at least 30 seconds on each leg    Time 8   Period Weeks   Status On-going   PT LONG TERM GOAL #3   Title Patient will demonstrate at least 4+/5 strength in bilateral lower extremities and proximal muscle groups    Time 8   Period Weeks   Status On-going   PT LONG TERM GOAL #4   Title Patient will demonstrate at least 45 degrees of bilateral hip ER and at least 40 degrees of bilateral hip IR   Time 8   Period Weeks   Status On-going   PT LONG TERM GOAL #5   Title Patient will be able to perform functional tasks in her community and attend events in her community with minimal fatigue and no concerns regarding loss of balance    Baseline 5/11- patient reports she continues to have fatigue, also reports she is not worried about getting in crowds    Time 8   Period Weeks   Status On-going               Plan - 05/22/14 1553    Clinical Impression Statement Continued to focus on functional strength, functional balance, and functional activity tolerance today. Patient requested that today be her last session, as she has a lot of personal events and personal business coming up; she also states that she is happy  with her current level of function and that  although her function is improving her neuropathy has not. Completed STAR patient exit survey with patient at end of session.    Pt will benefit from skilled therapeutic intervention in order to improve on the following  deficits Decreased coordination;Improper body mechanics;Impaired flexibility;Decreased endurance;Impaired sensation;Postural dysfunction;Decreased activity tolerance;Obesity;Decreased balance;Decreased mobility;Decreased  strength   Rehab Potential Good   PT Treatment/Interventions ADLs/Self Care Home Management;Gait training;Neuromuscular re-education;Stair training;Functional mobility training;Patient/family education;Therapeutic activities;Therapeutic exercise;Manual techniques;Energy conservation;Balance training   PT Next Visit Plan DC today per patient request    PT Home Exercise Plan bridges, sidelying hip abduction, standing hip extension, tandem stance    Consulted and Agree with Plan of Care Patient        Problem List Patient Active Problem List   Diagnosis Date Noted  . Peripheral neuropathy 08/26/2011  . Ovarian cancer on right 06/17/2011  . Hypertension 06/17/2011  . H/O hysterectomy with oophorectomy 06/17/2011   PHYSICAL THERAPY DISCHARGE SUMMARY  Visits from Start of Care: 7  Current functional level related to goals / functional outcomes: Patient demonstrates improved functional activity tolerance, balance skills, and strength, however at time of last re-assessment (2 appointments ago) had not met any of the goals set at initial evaluation. Patient requested that today be her last session due to a high number of events and very busy personal schedule in the upcoming weeks, as well as being satisfied with her current level of function.    Remaining deficits: Reduced functional activity tolerance, reduced balance, reduced strength   Education / Equipment: Education regarding importance of remaining active, coupon for [employer] provided to patient  Plan: Patient agrees to discharge.  Patient goals were not met. Patient is being discharged due to being pleased with the current functional level.  ?????        Deniece Ree PT, DPT [place] [address] [place], [place], [address] Phone: [phone]   Fax:  [phone]

## 2014-05-24 ENCOUNTER — Encounter (HOSPITAL_COMMUNITY): Payer: Medicare Other | Admitting: Physical Therapy

## 2014-06-12 DIAGNOSIS — I10 Essential (primary) hypertension: Secondary | ICD-10-CM | POA: Diagnosis not present

## 2014-06-12 DIAGNOSIS — K589 Irritable bowel syndrome without diarrhea: Secondary | ICD-10-CM | POA: Diagnosis not present

## 2014-06-12 DIAGNOSIS — T07 Unspecified multiple injuries: Secondary | ICD-10-CM | POA: Diagnosis not present

## 2014-06-12 DIAGNOSIS — E782 Mixed hyperlipidemia: Secondary | ICD-10-CM | POA: Diagnosis not present

## 2014-06-12 DIAGNOSIS — H1045 Other chronic allergic conjunctivitis: Secondary | ICD-10-CM | POA: Diagnosis not present

## 2014-06-12 DIAGNOSIS — R739 Hyperglycemia, unspecified: Secondary | ICD-10-CM | POA: Diagnosis not present

## 2014-06-12 DIAGNOSIS — Z6841 Body Mass Index (BMI) 40.0 and over, adult: Secondary | ICD-10-CM | POA: Diagnosis not present

## 2014-06-26 DIAGNOSIS — L308 Other specified dermatitis: Secondary | ICD-10-CM | POA: Diagnosis not present

## 2014-06-26 DIAGNOSIS — L718 Other rosacea: Secondary | ICD-10-CM | POA: Diagnosis not present

## 2014-06-26 DIAGNOSIS — L821 Other seborrheic keratosis: Secondary | ICD-10-CM | POA: Diagnosis not present

## 2014-07-01 ENCOUNTER — Other Ambulatory Visit: Payer: Self-pay

## 2014-07-03 ENCOUNTER — Encounter (INDEPENDENT_AMBULATORY_CARE_PROVIDER_SITE_OTHER): Payer: Self-pay | Admitting: *Deleted

## 2014-07-15 ENCOUNTER — Encounter: Payer: Self-pay | Admitting: Adult Health

## 2014-07-15 ENCOUNTER — Telehealth: Payer: Self-pay | Admitting: Adult Health

## 2014-07-15 ENCOUNTER — Ambulatory Visit (INDEPENDENT_AMBULATORY_CARE_PROVIDER_SITE_OTHER): Payer: Medicare Other | Admitting: Adult Health

## 2014-07-15 ENCOUNTER — Other Ambulatory Visit (HOSPITAL_COMMUNITY)
Admission: RE | Admit: 2014-07-15 | Discharge: 2014-07-15 | Disposition: A | Discharge status: Home / Self Care | Payer: Medicare Other | Source: Ambulatory Visit | Attending: Adult Health | Admitting: Adult Health

## 2014-07-15 VITALS — BP 120/80 | HR 68 | Ht 61.0 in | Wt 230.0 lb

## 2014-07-15 DIAGNOSIS — Z124 Encounter for screening for malignant neoplasm of cervix: Secondary | ICD-10-CM

## 2014-07-15 DIAGNOSIS — Z01419 Encounter for gynecological examination (general) (routine) without abnormal findings: Secondary | ICD-10-CM

## 2014-07-15 DIAGNOSIS — Z8543 Personal history of malignant neoplasm of ovary: Secondary | ICD-10-CM | POA: Diagnosis not present

## 2014-07-15 DIAGNOSIS — Z1151 Encounter for screening for human papillomavirus (HPV): Secondary | ICD-10-CM | POA: Insufficient documentation

## 2014-07-15 DIAGNOSIS — K439 Ventral hernia without obstruction or gangrene: Secondary | ICD-10-CM | POA: Diagnosis not present

## 2014-07-15 DIAGNOSIS — C561 Malignant neoplasm of right ovary: Secondary | ICD-10-CM

## 2014-07-15 NOTE — Patient Instructions (Addendum)
Try luvena for vaginal moisture  Pelvic in 1 year  if pain go to ER  Hernia A hernia occurs when an internal organ pushes out through a weak spot in the abdominal wall. Hernias most commonly occur in the groin and around the navel. Hernias often can be pushed back into place (reduced). Most hernias tend to get worse over time. Some abdominal hernias can get stuck in the opening (irreducible or incarcerated hernia) and cannot be reduced. An irreducible abdominal hernia which is tightly squeezed into the opening is at risk for impaired blood supply (strangulated hernia). A strangulated hernia is a medical emergency. Because of the risk for an irreducible or strangulated hernia, surgery may be recommended to repair a hernia. CAUSES   Heavy lifting.  Prolonged coughing.  Straining to have a bowel movement.  A cut (incision) made during an abdominal surgery. HOME CARE INSTRUCTIONS   Bed rest is not required. You may continue your normal activities.  Avoid lifting more than 10 pounds (4.5 kg) or straining.  Cough gently. If you are a smoker it is best to stop. Even the best hernia repair can break down with the continual strain of coughing. Even if you do not have your hernia repaired, a cough will continue to aggravate the problem.  Do not wear anything tight over your hernia. Do not try to keep it in with an outside bandage or truss. These can damage abdominal contents if they are trapped within the hernia sac.  Eat a normal diet.  Avoid constipation. Straining over long periods of time will increase hernia size and encourage breakdown of repairs. If you cannot do this with diet alone, stool softeners may be used. SEEK IMMEDIATE MEDICAL CARE IF:   You have a fever.  You develop increasing abdominal pain.  You feel nauseous or vomit.  Your hernia is stuck outside the abdomen, looks discolored, feels hard, or is tender.  You have any changes in your bowel habits or in the hernia that  are unusual for you.  You have increased pain or swelling around the hernia.  You cannot push the hernia back in place by applying gentle pressure while lying down. MAKE SURE YOU:   Understand these instructions.  Will watch your condition.  Will get help right away if you are not doing well or get worse. Document Released: 12/21/2004 Document Revised: 03/15/2011 Document Reviewed: 08/10/2007 [facility] Patient Information 2015 [place], [place]. This information is not intended to replace advice given to you by your health care provider. Make sure you discuss any questions you have with your health care provider.

## 2014-07-15 NOTE — Telephone Encounter (Signed)
Pt aware CT abd/pelvis is 7/15 at 8 am to go get contrast this week, and be NPO for 2 hours prior, will get Angie to precert

## 2014-07-15 NOTE — Progress Notes (Signed)
Subjective:     Patient ID: Sarah Walker, female   DOB: 11-14-1950, 64 y.o.   MRN: [id number]  HPI Sarah Walker is a 64 year old white female, married in for a pap smear and pelvic exam, she is sp hysterectomy for ovarian cancer 1A in 2013 and has done well.She works at [employer] from 9-1 daily.  Review of Systems Patient denies any headaches, hearing loss, fatigue, blurred vision, shortness of breath, chest pain, abdominal pain, problems with bowel movements, urination, or intercourse. No joint pain or mood swings.Does felt tight left of navel at times.  Reviewed past medical,surgical, social and family history. Reviewed medications and allergies.     Objective:   Physical Exam BP 120/80 mmHg  Pulse 68  Ht 5\' 1"  (1.549 m)  Wt 230 lb (104.327 kg)  BMI 43.48 kg/m2 Skin warm and dry.Pelvic: external genitalia is normal in appearance no lesions, vagina: has decreased color, moisture and rugae, urethra has no lesions or masses noted, cervix and uterus absent, adnexa: no masses or tenderness noted.Pap with HPV performed. Bladder is non tender and no masses felt, abdomen soft and non tender, no HSM has ventral hernia to left of navel and vertical healed mid line incision.Rectal deferred at her request will be seeing Dr Laural Golden in near future for colonoscopy.   Last CT 2014 and it showed tiny ventral hernia, will call oncology to see if they want another CT now or wait till she sees them in September.Discussed she really does not have to have another pap,unless she wants one, because she has no cervix.Spoke with H.Bray RN at cancer center, she will check to see about CT and call me back.  Assessment:   Pelvic exam with pap History of ovarian cancer Ventral hernia     Plan:     Try luvena for vaginal moisture and lubricate with sex Pelvic in 1 year Go to ER if any pain at hernia Review handout on hernia   Follow up with oncology in September as scheduled

## 2014-07-15 NOTE — Telephone Encounter (Signed)
Pt aware that I will schedule CT abd/pelvis with contrast

## 2014-07-16 LAB — CYTOLOGY - PAP

## 2014-07-17 ENCOUNTER — Telehealth: Payer: Self-pay | Admitting: Adult Health

## 2014-07-17 ENCOUNTER — Other Ambulatory Visit (HOSPITAL_COMMUNITY): Payer: Medicare Other

## 2014-07-17 NOTE — Telephone Encounter (Signed)
Pt has CT later this week, does not have to have surgery til she is ready, as long as no pain or problems

## 2014-07-19 ENCOUNTER — Ambulatory Visit (HOSPITAL_COMMUNITY)
Admission: RE | Admit: 2014-07-19 | Discharge: 2014-07-19 | Disposition: A | Discharge status: Home / Self Care | Payer: Medicare Other | Source: Ambulatory Visit | Attending: Adult Health | Admitting: Adult Health

## 2014-07-19 DIAGNOSIS — Z8543 Personal history of malignant neoplasm of ovary: Secondary | ICD-10-CM

## 2014-07-19 DIAGNOSIS — K469 Unspecified abdominal hernia without obstruction or gangrene: Secondary | ICD-10-CM | POA: Diagnosis not present

## 2014-07-19 DIAGNOSIS — Z9221 Personal history of antineoplastic chemotherapy: Secondary | ICD-10-CM | POA: Diagnosis not present

## 2014-07-19 DIAGNOSIS — R1032 Left lower quadrant pain: Secondary | ICD-10-CM | POA: Insufficient documentation

## 2014-07-19 DIAGNOSIS — K439 Ventral hernia without obstruction or gangrene: Secondary | ICD-10-CM | POA: Diagnosis not present

## 2014-07-19 LAB — POCT I-STAT CREATININE: Creatinine, Ser: 0.8 mg/dL (ref 0.44–1.00)

## 2014-07-19 MED ORDER — IOHEXOL 300 MG/ML  SOLN
100.0000 mL | Freq: Once | INTRAMUSCULAR | Status: AC | PRN
  Administered 2014-07-19: 100 mL via INTRAVENOUS

## 2014-07-22 ENCOUNTER — Telehealth: Payer: Self-pay | Admitting: Adult Health

## 2014-07-22 NOTE — Telephone Encounter (Signed)
Left message I called,call me back.  

## 2014-07-22 NOTE — Telephone Encounter (Signed)
Pt aware of hernia and that it needs to be repaired at some point, she is going to talk with daughter in law about surgeons and get back with me, she is also aware if has pain go to ER

## 2014-07-29 ENCOUNTER — Telehealth: Payer: Self-pay | Admitting: Adult Health

## 2014-07-29 NOTE — Telephone Encounter (Signed)
Pt to let me know who wants to do her surgery this week

## 2014-07-30 ENCOUNTER — Telehealth: Payer: Self-pay | Admitting: Adult Health

## 2014-07-30 NOTE — Telephone Encounter (Signed)
Left message appt 8/15 at 11:30 arrive at 11 am to see Dr Donella Stade

## 2014-07-30 NOTE — Telephone Encounter (Signed)
Pt wants to see [facility] in [facility] for her hernia repair, will make referral

## 2014-08-01 ENCOUNTER — Other Ambulatory Visit (INDEPENDENT_AMBULATORY_CARE_PROVIDER_SITE_OTHER): Payer: Self-pay | Admitting: *Deleted

## 2014-08-01 ENCOUNTER — Encounter (INDEPENDENT_AMBULATORY_CARE_PROVIDER_SITE_OTHER): Payer: Self-pay | Admitting: *Deleted

## 2014-08-01 DIAGNOSIS — Z1211 Encounter for screening for malignant neoplasm of colon: Secondary | ICD-10-CM

## 2014-08-14 DIAGNOSIS — M1712 Unilateral primary osteoarthritis, left knee: Secondary | ICD-10-CM | POA: Diagnosis not present

## 2014-08-19 ENCOUNTER — Ambulatory Visit: Payer: Self-pay | Admitting: Surgery

## 2014-08-19 DIAGNOSIS — K432 Incisional hernia without obstruction or gangrene: Secondary | ICD-10-CM | POA: Diagnosis not present

## 2014-08-19 NOTE — H&P (Signed)
Sarah Walker. Sarah Walker 08/19/2014 11:17 AM Location: [facility] Patient #: [id number] DOB: Sarah Walker, Sarah Walker Married / Language: English / Race: White Female History of Present Illness Sarah Moores A. Catheline Hixon MD; 08/19/2014 12:20 PM) Patient words: ventral hernia   Pt sent at the request of Sarah Walker for incisional hernia. The patient has a history of stage IA papillary serous cystadenocarcinoma status post surgical resection followed by adjuvant chemotherapy consisting of 6 cycles of carboplatin and paclitaxel from 07/15/2011 through 11/04/2011. At the time of presentation her cancer marker was elevated and remained elevated after surgery. It normalized with chemotherapy. She had a hx of PE and was on coumadin but this was stopped. Pt has noticed pain and bulge at her old incision site. Pain is moderate and no bowel problems reported.         CLINICAL DATA: Patient states the left lower quadrant feels different. No pain. Prior hysterectomy. History of right-sided ovarian cancer diagnosed 2013, treated with chemotherapy and surgery. EXAM: CT ABDOMEN AND PELVIS WITH CONTRAST TECHNIQUE: Multidetector CT imaging of the abdomen and pelvis was performed using the standard protocol following bolus administration of intravenous contrast. CONTRAST: 11mL OMNIPAQUE IOHEXOL 300 MG/ML SOLN COMPARISON: CT abdomen pelvis 05/15/2012 FINDINGS: Lower chest: Dependent atelectasis within the bilateral lower lobes. Hepatobiliary: Multiple low-attenuation hepatic lesions, unchanged from prior, favored represent hepatic cysts. Patient status post cholecystectomy. No intrahepatic or extrahepatic biliary ductal dilatation. Pancreas: Unremarkable Spleen: Unremarkable Adrenals/Urinary Tract: Normal adrenal glands. Kidneys are symmetric in size. No hydronephrosis. Urinary bladder is unremarkable. Stomach/Bowel: No abnormal bowel wall thickening or evidence for bowel obstruction. No free fluid or free  intraperitoneal air. Vascular/Lymphatic: Circumaortic left renal vein. Normal caliber abdominal aorta. No retroperitoneal lymphadenopathy. Surgical clips within the retroperitoneum. Other: Patient status post TAHBSO. Re- demonstrated fat containing ventral lower anterior abdominal wall hernia (image 85; series 2). Additionally there has been interval increase in size of fat containing left anterior abdominal wall hernia with an opening measuring 4.4 cm (image 56; series 2). Additionally more inferiorly and medially there is a new small bowel containing ventral abdominal wall hernia with an opening measuring 6.3 cm. Musculoskeletal: Lower thoracic and lumbar spine degenerative changes. No aggressive or acute appearing osseous lesions. IMPRESSION: Interval development of a broad mouth small bowel containing lower anterior abdominal wall hernia, new from prior. Additionally slightly more superiorly and laterally is an enlarging fat containing ventral abdominal wall hernia. The more anterior lower abdominal wall hernia is stable when compared to prior examination and currently contains fat. No evidence for acute bowel process or bowel obstruction. Electronically Signed By: Sarah Walker M.D. On: 07/19/2014 09:33 .  The patient is a 64 year old female   Other Problems Sarah Walker, CMA; 08/19/2014 11:17 AM) Arthritis High blood pressure Oophorectomy Left. Ovarian Cancer  Past Surgical History Sarah Walker, [place]; 08/19/2014 11:17 AM) Gallbladder Surgery - Laparoscopic Hysterectomy (due to cancer) - Complete Tonsillectomy  Diagnostic Studies History Sarah Walker, [place]; 08/19/2014 11:17 AM) Colonoscopy never Mammogram within last year  Allergies Sarah Walker, CMA; 08/19/2014 11:18 AM) Codeine Phosphate *ANALGESICS - OPIOID*  Medication History Sarah Walker, CMA; 08/19/2014 11:19 AM) Azelastine HCl (0.05% Solution, Ophthalmic)  Active. Bisoprolol-Hydrochlorothiazide (5-6.25MG  Tablet, Oral) Active. Meloxicam (15MG  Tablet, Oral) Active. Potassium Chloride Crys ER (20MEQ Tablet ER, Oral) Active. Valsartan (80MG  Tablet, Oral) Active. Allegra-D Allergy & Congestion (60-120MG  Tablet ER 12HR, Oral) Active. Align (Oral) Active.  Social History Sarah Walker, [place]; 08/19/2014 11:17 AM) Caffeine use Carbonated beverages, Coffee, Tea.  No alcohol use No drug use Tobacco use Never smoker.  Family History Sarah Walker, [place]; 08/19/2014 11:17 AM) Cancer Father. Cervical Cancer Mother. Family history unknown First Degree Relatives  Pregnancy / Birth History Sarah Walker, [place]; 08/19/2014 11:17 AM) Age at menarche 61 years. Age of menopause <44 Gravida 3 Maternal age 68-30 Para 2 Regular periods     Review of Systems Sarah Walker CMA; 08/19/2014 11:17 AM) General Not Present- Appetite Loss, Chills, Fatigue, Fever, Night Sweats, Weight Gain and Weight Loss. Skin Not Present- Change in Wart/Mole, Dryness, Hives, Jaundice, New Lesions, Non-Healing Wounds, Rash and Ulcer. HEENT Present- Seasonal Allergies. Not Present- Earache, Hearing Loss, Hoarseness, Nose Bleed, Oral Ulcers, Ringing in the Ears, Sinus Pain, Sore Throat, Visual Disturbances, Wears glasses/contact lenses and Yellow Eyes. Respiratory Not Present- Bloody sputum, Chronic Cough, Difficulty Breathing, Snoring and Wheezing. Breast Not Present- Breast Mass, Breast Pain, Nipple Discharge and Skin Changes. Cardiovascular Not Present- Chest Pain, Difficulty Breathing Lying Down, Leg Cramps, Palpitations, Rapid Heart Rate, Shortness of Breath and Swelling of Extremities. Gastrointestinal Not Present- Abdominal Pain, Bloating, Bloody Stool, Change in Bowel Habits, Chronic diarrhea, Constipation, Difficulty Swallowing, Excessive gas, Gets full quickly at meals, Hemorrhoids, Indigestion, Nausea, Rectal Pain and Vomiting. Female  Genitourinary Not Present- Frequency, Nocturia, Painful Urination, Pelvic Pain and Urgency. Musculoskeletal Present- Joint Stiffness. Not Present- Back Pain, Joint Pain, Muscle Pain, Muscle Weakness and Swelling of Extremities. Neurological Not Present- Decreased Memory, Fainting, Headaches, Numbness, Seizures, Tingling, Tremor, Trouble walking and Weakness. Psychiatric Not Present- Anxiety, Bipolar, Change in Sleep Pattern, Depression, Fearful and Frequent crying. Endocrine Not Present- Cold Intolerance, Excessive Hunger, Hair Changes, Heat Intolerance, Hot flashes and New Diabetes. Hematology Not Present- Easy Bruising, Excessive bleeding, Gland problems, HIV and Persistent Infections.  Vitals Sarah Walker CMA; 08/19/2014 11:17 AM) 08/19/2014 11:17 AM Weight: 224.25 lb Height: 62in Body Surface Area: 2.11 m Body Mass Index: 41.02 kg/m BP: 132/84 (Sitting, Left Arm, Standard)     Physical Exam (Sarah Gallardo A. Nancye Grumbine MD; 08/19/2014 12:21 PM)  General Mental Status-Alert. General Appearance-Consistent with stated age. Hydration-Well hydrated. Voice-Normal.  Head and Neck Head-normocephalic, atraumatic with no lesions or palpable masses. Trachea-midline. Thyroid Gland Characteristics - normal size and consistency.  Eye Eyeball - Bilateral-Extraocular movements intact. Sclera/Conjunctiva - Bilateral-No scleral icterus.  Chest and Lung Exam Chest and lung exam reveals -quiet, even and easy respiratory effort with no use of accessory muscles and on auscultation, normal breath sounds, no adventitious sounds and normal vocal resonance. Inspection Chest Wall - Normal. Back - normal.  Cardiovascular Cardiovascular examination reveals -normal heart sounds, regular rate and rhythm with no murmurs and normal pedal pulses bilaterally.  Abdomen Note: soft NT ND two reducible incisional hernia 4 cm or so in size soft reducible    Neurologic Neurologic  evaluation reveals -alert and oriented x 3 with no impairment of recent or remote memory. Mental Status-Normal.  Musculoskeletal Normal Exam - Left-Upper Extremity Strength Normal and Lower Extremity Strength Normal. Normal Exam - Right-Upper Extremity Strength Normal and Lower Extremity Strength Normal.    Assessment & Plan (Sarah Walker A. Sarah Veldhuizen MD; 08/19/2014 11:38 AM)  INCISIONAL HERNIA, WITHOUT OBSTRUCTION OR GANGRENE (553.21  K43.2) Impression: REOMMEND OPEN REPAIR WITH MYOFASCIAL ADVANCEMENT FLAP AND MESH NEEDS COLONOSCOPY PRIOR TO SURGERY SINCE THIS IS SET UP FOR OCTOBER The risk of hernia repair include bleeding, infection, organ injury, bowel injury, bladder injury, nerve injury recurrent hernia, blood clots, worsening of underlying condition, chronic pain, mesh use, open surgery, death, and  the need for other operations. Pt agrees to proceed  Current Plans The anatomy and the physiology was discussed. The pathophysiology and natural history of the disease was discussed. Options were discussed and recommendations were made. Technique, risks, benefits, & alternatives were discussed. Risks such as stroke, heart attack, bleeding, indection, death, and other risks discussed. Questions answered. The patient agrees to proceed. The anatomy & physiology of the abdominal wall was discussed. The pathophysiology of hernias was discussed. Natural history risks without surgery including progeressive enlargement, pain, incarceration, & strangulation was discussed. Contributors to complications such as smoking, obesity, diabetes, prior surgery, etc were discussed.  I feel the risks of no intervention will lead to serious problems that outweigh the operative risks; therefore, I recommended surgery to reduce and repair the hernia. I explained laparoscopic techniques with possible need for an open approach. I noted the probable use of mesh to patch and/or buttress the hernia repair  Risks such as  bleeding, infection, abscess, need for further treatment, heart attack, death, and other risks were discussed. I noted a good likelihood this will help address the problem. Goals of post-operative recovery were discussed as well. Possibility that this will not correct all symptoms was explained. I stressed the importance of low-impact activity, aggressive pain control, avoiding constipation, & not pushing through pain to minimize risk of post-operative chronic pain or injury. Possibility of reherniation especially with smoking, obesity, diabetes, immunosuppression, and other health conditions was discussed. We will work to minimize complications.  An educational handout further explaining the pathology & treatment options was given as well. Questions were answered. The patient expresses understanding & wishes to proceed with surgery. Pt Education - Incisional hernia: discussed with patient and provided information. Pt Education - [facility] Abdominal Surgery (Gross) Pt Education - [facility] Umbilical/ Inguinal Hernia [facility]

## 2014-08-20 ENCOUNTER — Telehealth (INDEPENDENT_AMBULATORY_CARE_PROVIDER_SITE_OTHER): Payer: Self-pay | Admitting: *Deleted

## 2014-08-20 NOTE — Telephone Encounter (Signed)
Referring MD/PCP: golding   Procedure: [facility]cs  Reason/Indication:  screening  Has patient had this procedure before?  no  If so, when, by whom and where?    Is there a family history of colon cancer?  no  Who?  What age when diagnosed?    Is patient diabetic?   no      Does patient have prosthetic heart valve?  no  Do you have a pacemaker?  no  Has patient ever had endocarditis? no  Has patient had joint replacement within last 12 months?  no  Does patient tend to be constipated or take laxatives? no  Is patient on Coumadin, Plavix and/or Aspirin? no  Medications: klor-con daily, meloxicam 15 mg daily, bisoprolol/hctz 5/6.25 mg daily, valsartan 80 mg daily, align otc daily, allegra or allegra d prn,   Allergies: nkda  Medication Adjustment:   Procedure date & time: 09/06/14 at 730

## 2014-08-20 NOTE — Telephone Encounter (Signed)
Patient needs trilyte 

## 2014-08-23 MED ORDER — PEG 3350-KCL-NA BICARB-NACL 420 G PO SOLR: 4000.0000 mL | Freq: Once | ORAL | Status: DC

## 2014-08-23 NOTE — Telephone Encounter (Signed)
agree

## 2014-09-06 ENCOUNTER — Encounter (HOSPITAL_COMMUNITY)
Admission: RE | Disposition: A | Discharge status: Home / Self Care | Payer: Self-pay | Source: Ambulatory Visit | Attending: Internal Medicine

## 2014-09-06 ENCOUNTER — Encounter (HOSPITAL_COMMUNITY): Payer: Self-pay

## 2014-09-06 ENCOUNTER — Ambulatory Visit (HOSPITAL_COMMUNITY)
Admission: RE | Admit: 2014-09-06 | Discharge: 2014-09-06 | Disposition: A | Discharge status: Home / Self Care | Payer: Medicare Other | Source: Ambulatory Visit | Attending: Internal Medicine | Admitting: Internal Medicine

## 2014-09-06 DIAGNOSIS — Z1211 Encounter for screening for malignant neoplasm of colon: Secondary | ICD-10-CM

## 2014-09-06 DIAGNOSIS — Z86718 Personal history of other venous thrombosis and embolism: Secondary | ICD-10-CM | POA: Insufficient documentation

## 2014-09-06 DIAGNOSIS — Z8543 Personal history of malignant neoplasm of ovary: Secondary | ICD-10-CM | POA: Insufficient documentation

## 2014-09-06 DIAGNOSIS — Z9221 Personal history of antineoplastic chemotherapy: Secondary | ICD-10-CM | POA: Insufficient documentation

## 2014-09-06 DIAGNOSIS — D124 Benign neoplasm of descending colon: Secondary | ICD-10-CM | POA: Insufficient documentation

## 2014-09-06 DIAGNOSIS — I10 Essential (primary) hypertension: Secondary | ICD-10-CM | POA: Insufficient documentation

## 2014-09-06 DIAGNOSIS — Z79899 Other long term (current) drug therapy: Secondary | ICD-10-CM | POA: Diagnosis not present

## 2014-09-06 DIAGNOSIS — K649 Unspecified hemorrhoids: Secondary | ICD-10-CM | POA: Insufficient documentation

## 2014-09-06 HISTORY — PX: COLONOSCOPY: SHX5424

## 2014-09-06 SURGERY — COLONOSCOPY
Anesthesia: Moderate Sedation

## 2014-09-06 MED ORDER — MIDAZOLAM HCL 5 MG/5ML IJ SOLN
INTRAMUSCULAR | Status: DC | PRN
Start: 2014-09-06 — End: 2014-09-06
  Administered 2014-09-06 (×3): 2 mg via INTRAVENOUS

## 2014-09-06 MED ORDER — SODIUM CHLORIDE 0.9 % IV SOLN
INTRAVENOUS | Status: DC
  Administered 2014-09-06: 08:00:00 via INTRAVENOUS

## 2014-09-06 MED ORDER — DICYCLOMINE HCL 10 MG PO CAPS: 10.0000 mg | ORAL_CAPSULE | Freq: Three times a day (TID) | ORAL | Status: DC | PRN

## 2014-09-06 MED ORDER — MEPERIDINE HCL 50 MG/ML IJ SOLN
INTRAMUSCULAR | Status: DC | PRN
  Administered 2014-09-06: 25 mg
  Administered 2014-09-06: 25 mg via INTRAVENOUS

## 2014-09-06 MED ORDER — STERILE WATER FOR IRRIGATION IR SOLN
Status: DC | PRN
  Administered 2014-09-06: 5 mL

## 2014-09-06 MED ORDER — MIDAZOLAM HCL 5 MG/5ML IJ SOLN
INTRAMUSCULAR | Status: AC
  Filled 2014-09-06: qty 10

## 2014-09-06 MED ORDER — MEPERIDINE HCL 50 MG/ML IJ SOLN
INTRAMUSCULAR | Status: DC
Start: 2014-09-06 — End: 2014-09-06
  Filled 2014-09-06: qty 1

## 2014-09-06 NOTE — H&P (Signed)
Sarah Walker is an 64 y.o. female.   Chief Complaint: Patient is here for colonoscopy. HPI: Patient is 64 year old Caucasian female who is here for screening colonoscopy. She denies abdominal pain rectal bleeding. She has had intermittent diarrhea with urgency since cholecystectomy 7 years ago.  Personal history significant for ovarian cancer and she remains in remission. Family sees negative for CRC.  Past Medical History  Diagnosis Date  . Allergic rhinitis   . Sinusitis   . Hypertension   . Ovarian cancer   . Pulmonary embolism   . Anemia associated with chemotherapy 09/16/2011  . Bone spur of other site     left foot  . Ovarian cancer on right 06/17/2011  . Peripheral neuropathy 08/26/2011    Chemotherapy-induced    Past Surgical History  Procedure Laterality Date  . Tonsillectomy      age 74's  . Cholecystectomy  1990  . Abdominal hysterectomy  05/25/2011    [facility] and cancer staging  . Tubal ligation  1981  . Portacath placement  06/16/11    Done at [facility]  . Ivc filter  05/24/11  . Port-a-cath removal Right 11/21/2013    Procedure: MINOR REMOVAL PORT-A-CATH;  Surgeon: Jamesetta So, MD;  Location: [facility];  Service: General;  Laterality: Right;    Family History  Problem Relation Age of Onset  . Stroke Mother   . Cancer Father    Social History:  reports that she has never smoked. She has never used smokeless tobacco. She reports that she does not drink alcohol or use illicit drugs.  Allergies:  Allergies  Allergen Reactions  . Codeine Nausea And Vomiting    Medications Prior to Admission  Medication Sig Dispense Refill  . azelastine (OPTIVAR) 0.05 % ophthalmic solution USE 1 DROP TWICE DAILY  11  . bisoprolol-hydrochlorothiazide (ZIAC) 5-6.25 MG per tablet Take 1 tablet by mouth daily.    Marland Kitchen Fexofenadine-Pseudoephedrine (ALLEGRA-D 12 HOUR PO) Take 1 tablet by mouth as needed (allergies).     . fluticasone (FLONASE) 50 MCG/ACT nasal spray Place 1  spray into both nostrils daily as needed for allergies or rhinitis.    . meloxicam (MOBIC) 15 MG tablet Take 15 mg by mouth daily.    . polyethylene glycol-electrolytes (NULYTELY/GOLYTELY) 420 G solution Take 4,000 mLs by mouth once. 4000 mL 0  . potassium chloride SA (K-DUR,KLOR-CON) 20 MEQ tablet take 1 tablet by mouth once daily 30 tablet 2  . Probiotic Product (ALIGN PO) Take 1 capsule by mouth as needed (bowels).     . valsartan (DIOVAN) 80 MG tablet Take 80 mg by mouth daily.      No results found for this or any previous visit (from the past 48 hour(s)). No results found.  ROS  Blood pressure 139/97, pulse 63, temperature 97.7 F (36.5 C), temperature source Oral, height 5\' 2"  (1.575 m), weight 225 lb (102.059 kg), SpO2 96 %. Physical Exam  Constitutional: She appears well-developed and well-nourished.  HENT:  Mouth/Throat: Oropharynx is clear and moist.  Eyes: Conjunctivae are normal. No scleral icterus.  Neck: No thyromegaly present.  Cardiovascular: Normal rate, regular rhythm and normal heart sounds.   No murmur heard. Respiratory: Effort normal and breath sounds normal.  GI: Soft. She exhibits no distension and no mass. There is no tenderness.  Musculoskeletal: She exhibits no edema.  Lymphadenopathy:    She has no cervical adenopathy.  Neurological: She is alert.  Skin: Skin is warm and dry.  Assessment/Plan Average risk screening colonoscopy.   AmeLie Hollars U 09/06/2014, 8:33 AM

## 2014-09-06 NOTE — Op Note (Signed)
COLONOSCOPY PROCEDURE REPORT  PATIENT:  Sarah Walker  MR#:  [id number] Birthdate:  02/08/1950, 64 y.o., female Endoscopist:  Dr. Rogene Houston, MD Referred By:  Dr. Purvis Kilts, MD  Procedure Date: 09/06/2014  Procedure:   Colonoscopy  Indications:  Patient is 64 year old Caucasian female was undergoing average risk screening colonoscopy. This is patient's first exam. Personal history significant for ovarian carcinoma for which she had surgery and chemotherapy in 2013 and remains in remission.  Informed Consent:  The procedure and risks were reviewed with the patient and informed consent was obtained.  Medications:  Demerol 50 mg IV Versed 6 mg IV  Description of procedure:  After a digital rectal exam was performed, that colonoscope was advanced from the anus through the rectum and colon to the area of the cecum, ileocecal valve and appendiceal orifice. The cecum was deeply intubated. These structures were well-seen and photographed for the record. From the level of the cecum and ileocecal valve, the scope was slowly and cautiously withdrawn. The mucosal surfaces were carefully surveyed utilizing scope tip to flexion to facilitate fold flattening as needed. The scope was pulled down into the rectum where a thorough exam including retroflexion was performed.  Findings:   Prep satisfactory. 5 mm polyp cold snared from descending colon. Mucosa of rest of the colon was normal. Normal rectal mucosa. Small hemorrhoids below the dentate line.   Therapeutic/Diagnostic Maneuvers Performed:  See above  Complications:  None  Cecal Withdrawal Time:  12 minutes  Impression:  Examination performed to cecum. 5 mm polyp cold snare from descending colon. Small external hemorrhoids.  Recommendations:  Standard instructions given. I will contact patient with biopsy results and further recommendations.  Kregg Cihlar U  09/06/2014 9:12 AM  CC: Dr. Hilma Favors, Betsy Coder, MD & Dr. Rayne Du ref.  provider found

## 2014-09-06 NOTE — Discharge Instructions (Signed)
Resume usual medications and diet. Dicyclomine 10 mg by mouth 15-30 minutes before each meal as needed. No driving for 24 hours. Physician will call with biopsy results.   Colonoscopy, Care After These instructions give you information on caring for yourself after your procedure. Your doctor may also give you more specific instructions. Call your doctor if you have any problems or questions after your procedure. HOME CARE  Do not drive for 24 hours.  Do not sign important papers or use machinery for 24 hours.  You may shower.  You may go back to your usual activities, but go slower for the first 24 hours.  Take rest breaks often during the first 24 hours.  Walk around or use warm packs on your belly (abdomen) if you have belly cramping or gas.  Drink enough fluids to keep your pee (urine) clear or pale yellow.  Resume your normal diet. Avoid heavy or fried foods.  Avoid drinking alcohol for 24 hours or as told by your doctor.  Only take medicines as told by your doctor. If a tissue sample (biopsy) was taken during the procedure:   Do not take aspirin or blood thinners for 7 days, or as told by your doctor.  Do not drink alcohol for 7 days, or as told by your doctor.  Eat soft foods for the first 24 hours. GET HELP IF: You still have a small amount of blood in your poop (stool) 2-3 days after the procedure. GET HELP RIGHT AWAY IF:  You have more than a small amount of blood in your poop.  You see clumps of tissue (blood clots) in your poop.  Your belly is puffy (swollen).  You feel sick to your stomach (nauseous) or throw up (vomit).  You have a fever.  You have belly pain that gets worse and medicine does not help. MAKE SURE YOU:  Understand these instructions.  Will watch your condition.  Will get help right away if you are not doing well or get worse. Document Released: 01/23/2010 Document Revised: 12/26/2012 Document Reviewed: 08/28/2012 [facility] Patient  Information 2015 [place], [place]. This information is not intended to replace advice given to you by your health care provider. Make sure you discuss any questions you have with your health care provider.   Colon Polyps Polyps are lumps of extra tissue growing inside the body. Polyps can grow in the large intestine (colon). Most colon polyps are noncancerous (benign). However, some colon polyps can become cancerous over time. Polyps that are larger than a pea may be harmful. To be safe, caregivers remove and test all polyps. CAUSES  Polyps form when mutations in the genes cause your cells to grow and divide even though no more tissue is needed. RISK FACTORS There are a number of risk factors that can increase your chances of getting colon polyps. They include:  Being older than 50 years.  Family history of colon polyps or colon cancer.  Long-term colon diseases, such as colitis or Crohn disease.  Being overweight.  Smoking.  Being inactive.  Drinking too much alcohol. SYMPTOMS  Most small polyps do not cause symptoms. If symptoms are present, they may include:  Blood in the stool. The stool may look dark red or black.  Constipation or diarrhea that lasts longer than 1 week. DIAGNOSIS People often do not know they have polyps until their caregiver finds them during a regular checkup. Your caregiver can use 4 tests to check for polyps:  Digital rectal exam. The caregiver  wears gloves and feels inside the rectum. This test would find polyps only in the rectum.  Barium enema. The caregiver puts a liquid called barium into your rectum before taking X-rays of your colon. Barium makes your colon look white. Polyps are dark, so they are easy to see in the X-ray pictures.  Sigmoidoscopy. A thin, flexible tube (sigmoidoscope) is placed into your rectum. The sigmoidoscope has a light and tiny camera in it. The caregiver uses the sigmoidoscope to look at the last third of your  colon.  Colonoscopy. This test is like sigmoidoscopy, but the caregiver looks at the entire colon. This is the most common method for finding and removing polyps. TREATMENT  Any polyps will be removed during a sigmoidoscopy or colonoscopy. The polyps are then tested for cancer. PREVENTION  To help lower your risk of getting more colon polyps:  Eat plenty of fruits and vegetables. Avoid eating fatty foods.  Do not smoke.  Avoid drinking alcohol.  Exercise every day.  Lose weight if recommended by your caregiver.  Eat plenty of calcium and folate. Foods that are rich in calcium include milk, cheese, and broccoli. Foods that are rich in folate include chickpeas, kidney beans, and spinach. HOME CARE INSTRUCTIONS Keep all follow-up appointments as directed by your caregiver. You may need periodic exams to check for polyps. SEEK MEDICAL CARE IF: You notice bleeding during a bowel movement. Document Released: 09/17/2003 Document Revised: 03/15/2011 Document Reviewed: 03/02/2011 [facility] Patient Information 2015 [place], [place]. This information is not intended to replace advice given to you by your health care provider. Make sure you discuss any questions you have with your health care provider.

## 2014-09-10 ENCOUNTER — Encounter (HOSPITAL_COMMUNITY): Payer: Self-pay | Admitting: Internal Medicine

## 2014-09-19 ENCOUNTER — Other Ambulatory Visit (HOSPITAL_COMMUNITY): Payer: 59

## 2014-09-19 ENCOUNTER — Ambulatory Visit (HOSPITAL_COMMUNITY): Payer: 59 | Admitting: Hematology & Oncology

## 2014-09-19 ENCOUNTER — Other Ambulatory Visit (HOSPITAL_COMMUNITY): Payer: Medicare Other

## 2014-09-30 ENCOUNTER — Encounter (HOSPITAL_BASED_OUTPATIENT_CLINIC_OR_DEPARTMENT_OTHER): Payer: Medicare Other

## 2014-09-30 ENCOUNTER — Encounter (HOSPITAL_COMMUNITY): Payer: Self-pay | Admitting: Hematology & Oncology

## 2014-09-30 ENCOUNTER — Encounter (HOSPITAL_COMMUNITY): Payer: Medicare Other | Attending: Hematology & Oncology | Admitting: Hematology & Oncology

## 2014-09-30 VITALS — BP 130/64 | HR 63 | Temp 97.9°F | Resp 18 | Wt 226.6 lb

## 2014-09-30 DIAGNOSIS — C561 Malignant neoplasm of right ovary: Secondary | ICD-10-CM | POA: Diagnosis not present

## 2014-09-30 DIAGNOSIS — Z86711 Personal history of pulmonary embolism: Secondary | ICD-10-CM

## 2014-09-30 LAB — CBC WITH DIFFERENTIAL/PLATELET
BASOS PCT: 0 %
Basophils Absolute: 0 10*3/uL (ref 0.0–0.1)
EOS PCT: 3 %
Eosinophils Absolute: 0.1 10*3/uL (ref 0.0–0.7)
HEMATOCRIT: 43.4 % (ref 36.0–46.0)
Hemoglobin: 14.2 g/dL (ref 12.0–15.0)
Lymphocytes Relative: 46 %
Lymphs Abs: 2.2 10*3/uL (ref 0.7–4.0)
MCH: 32.9 pg (ref 26.0–34.0)
MCHC: 32.7 g/dL (ref 30.0–36.0)
MCV: 100.5 fL — ABNORMAL HIGH (ref 78.0–100.0)
MONO ABS: 0.3 10*3/uL (ref 0.1–1.0)
MONOS PCT: 6 %
NEUTROS ABS: 2.2 10*3/uL (ref 1.7–7.7)
Neutrophils Relative %: 45 %
Platelets: 162 10*3/uL (ref 150–400)
RBC: 4.32 MIL/uL (ref 3.87–5.11)
RDW: 13.7 % (ref 11.5–15.5)
WBC: 4.8 10*3/uL (ref 4.0–10.5)

## 2014-09-30 LAB — COMPREHENSIVE METABOLIC PANEL
ALBUMIN: 4.1 g/dL (ref 3.5–5.0)
ALT: 28 U/L (ref 14–54)
ANION GAP: 8 (ref 5–15)
AST: 22 U/L (ref 15–41)
Alkaline Phosphatase: 68 U/L (ref 38–126)
BILIRUBIN TOTAL: 0.5 mg/dL (ref 0.3–1.2)
BUN: 18 mg/dL (ref 6–20)
CO2: 28 mmol/L (ref 22–32)
Calcium: 9.3 mg/dL (ref 8.9–10.3)
Chloride: 105 mmol/L (ref 101–111)
Creatinine, Ser: 0.76 mg/dL (ref 0.44–1.00)
GFR calc Af Amer: >60 mL/min (ref >60)
GLUCOSE: 102 mg/dL — AB (ref 65–99)
POTASSIUM: 4.4 mmol/L (ref 3.5–5.1)
Sodium: 141 mmol/L (ref 135–145)
TOTAL PROTEIN: 7.3 g/dL (ref 6.5–8.1)

## 2014-09-30 NOTE — Progress Notes (Signed)
Sarah Walker presented for labwork. Labs per MD order drawn via Peripheral Line 23 gauge needle inserted in right AC  Good blood return present. Procedure without incident.  Needle removed intact. Patient tolerated procedure well.

## 2014-09-30 NOTE — Patient Instructions (Addendum)
[place] at [facility] Discharge Instructions  RECOMMENDATIONS MADE BY THE CONSULTANT AND ANY TEST RESULTS WILL BE SENT TO YOUR REFERRING PHYSICIAN.  Exam and discussion by Dr. Whitney Muse. If any issues with your labs we will call you. Call with any concerns or issues.  Follow-up: Labs and office visit in 6 months.  Thank you for choosing [place] at [facility] to provide your oncology and hematology care.  To afford each patient quality time with our provider, please arrive at least 15 minutes before your scheduled appointment time.    You need to re-schedule your appointment should you arrive 10 or more minutes late.  We strive to give you quality time with our providers, and arriving late affects you and other patients whose appointments are after yours.  Also, if you no show three or more times for appointments you may be dismissed from the clinic at the providers discretion.     Again, thank you for choosing [facility].  Our hope is that these requests will decrease the amount of time that you wait before being seen by our physicians.       _____________________________________________________________  Should you have questions after your visit to [facility], please contact our office at [phone] between the hours of 8:30 a.m. and 4:30 p.m.  Voicemails left after 4:30 p.m. will not be returned until the following business day.  For prescription refill requests, have your pharmacy contact our office.

## 2014-09-30 NOTE — Progress Notes (Signed)
Sarah Walker, [place] [address]  Ovarian cancer on right - Plan: CBC with Differential, Comprehensive metabolic panel, CA 081  CURRENT THERAPY: Surveillance per NCCN guidelines  INTERVAL HISTORY: Sarah Walker 64 y.o. female returns for followup of stage IA papillary serous cystadenocarcinoma status post surgical resection followed by adjuvant chemotherapy consisting of 6 cycles of carboplatin and paclitaxel from 07/15/2011 through 11/04/2011. At the time of presentation her cancer marker was elevated and remained elevated after surgery. It normalized with chemotherapy.   This lady's presentation in May of 4481 was complicated by pulmonary emboli. An IVC filter was placed initially prior surgery and was removed in December 2013.  Anticoagulation was discontinued following one year's worth of anticoagulation.   Ms. Sarah Walker is here alone today. Her cancer was originally found due to an onset of vaginal bleeding. Her surgery was performed in [place] by Dr. Charissa Bash.  Her neuropathy is unchanged. She is able to unbutton shirts. She explains the feeling as being numb on the tips of her fingers and toes. She notes that Mobic seems to help the neuropathy. She is not currently on this medication because she is getting ready to have surgery. She still plays and teaches piano, though sometimes she notices that she is a bit slower. Physical therapy did not help with her foot neuropathy. She was not able to walk on sand at the beach due to her neuropathy and needed to wear sandals. She has not fallen.  Her last mammogram was performed in May. She is up to date on her colonoscopies, with Dr. Laural Golden.     Ovarian cancer on right   05/25/2011 Surgery Debulking surgery at [facility]   05/25/2011 Initial Diagnosis Ovarian cancer   07/15/2011 - 11/04/2011 Chemotherapy Carboplatin/Pacliatexel x 6 cycles   12/01/2011 Remission CT scan negative for  disease   05/15/2012 Imaging CT abd/pelvis- Negative for mass or adenopathy.    I personally reviewed and went over laboratory results with the patient.  The results are noted within this dictation.  Oncologically, she denies any complaints and ROS questioning is negative.    Past Medical History  Diagnosis Date  . Allergic rhinitis   . Sinusitis   . Hypertension   . Ovarian cancer   . Pulmonary embolism   . Anemia associated with chemotherapy 09/16/2011  . Bone spur of other site     left foot  . Ovarian cancer on right 06/17/2011  . Peripheral neuropathy 08/26/2011    Chemotherapy-induced  . Abdominal hernia     has Ovarian cancer on right; Hypertension; H/O hysterectomy with oophorectomy; Peripheral neuropathy; and Ventral hernia on her problem list.     is allergic to codeine.  Sarah Walker had no medications administered during this visit.  Past Surgical History  Procedure Laterality Date  . Tonsillectomy      age 16's  . Cholecystectomy  1990  . Abdominal hysterectomy  05/25/2011    [place] bs&[facility] and cancer staging  . Tubal ligation  1981  . Portacath placement  06/16/11    Done at [facility]  . Ivc filter  05/24/11  . Port-a-cath removal Right 11/21/2013    Procedure: MINOR REMOVAL PORT-A-CATH;  Surgeon: Jamesetta So, MD;  Location: [facility] ORS;  Service: General;  Laterality: Right;  . Colonoscopy N/A 09/06/2014    Procedure: COLONOSCOPY;  Surgeon: Rogene Houston, MD;  Location: [facility] ENDO SUITE;  Service: Endoscopy;  Laterality:  N/A;  930 - moved to 9/2 @ 8:25    Denies any headaches, dizziness, double vision, fevers, chills, night sweats, nausea, vomiting, diarrhea, constipation, chest pain, heart palpitations, shortness of breath, blood in stool, black tarry stool, urinary pain, urinary burning, urinary frequency, hematuria. Positive for neuropathy.    Numbness in the tips of her fingers and toes.   PHYSICAL EXAMINATION  ECOG PERFORMANCE STATUS: 0 -  Asymptomatic  Filed Vitals:   09/30/14 1401  BP: 130/64  Pulse: 63  Temp: 97.9 F (36.6 C)  Resp: 18    GENERAL:alert, no distress, well nourished, well developed, comfortable, cooperative, obese and smiling SKIN: skin color, texture, turgor are normal, no rashes or significant lesions HEAD: Normocephalic, No masses, lesions, tenderness or abnormalities EYES: normal, PERRLA, EOMI, Conjunctiva are pink and non-injected EARS: External ears normal OROPHARYNX:lips, buccal mucosa, and tongue normal and mucous membranes are moist  NECK: supple, no adenopathy, thyroid normal size, non-tender, without nodularity, no stridor, non-tender, trachea midline LYMPH:  no palpable lymphadenopathy, no hepatosplenomegaly BREAST:not examined LUNGS: clear to auscultation and percussion HEART: regular rate & rhythm, no murmurs, no gallops, S1 normal and S2 normal ABDOMEN:abdomen soft, non-tender, obese, normal bowel sounds, no masses or organomegaly and no hepatosplenomegaly BACK: Back symmetric, no curvature., No CVA tenderness EXTREMITIES:less then 2 second capillary refill, no joint deformities, effusion, or inflammation, no edema, no skin discoloration, no clubbing, no cyanosis  NEURO: alert & oriented x 3 with fluent speech, no focal motor/sensory deficits, gait normal   LABORATORY DATA: I have reviewed the labs below. CBC    Component Value Date/Time   WBC 4.8 09/30/2014 1320   RBC 4.32 09/30/2014 1320   HGB 14.2 09/30/2014 1320   HCT 43.4 09/30/2014 1320   PLT 162 09/30/2014 1320   MCV 100.5* 09/30/2014 1320   MCH 32.9 09/30/2014 1320   MCHC 32.7 09/30/2014 1320   RDW 13.7 09/30/2014 1320   LYMPHSABS 2.2 09/30/2014 1320   MONOABS 0.3 09/30/2014 1320   EOSABS 0.1 09/30/2014 1320   BASOSABS 0.0 09/30/2014 1320      Chemistry      Component Value Date/Time   NA 141 09/30/2014 1320   K 4.4 09/30/2014 1320   CL 105 09/30/2014 1320   CO2 28 09/30/2014 1320   BUN 18 09/30/2014 1320    CREATININE 0.76 09/30/2014 1320      Component Value Date/Time   CALCIUM 9.3 09/30/2014 1320   ALKPHOS 68 09/30/2014 1320   AST 22 09/30/2014 1320   ALT 28 09/30/2014 1320   BILITOT 0.5 09/30/2014 1320      CA-125: 20 (09/12/2013)  ASSESSMENT AND PLAN:  Stage I ovarian cancer Treatment related neuropathy, Taxol induced (Grade I) History of PE   She is overall doing well. She has no evidence of recurrent disease. We will call her with the results of her CA 125. We discussed that she needs a mammogram and breast exam yearly. She continues to follow regularly with her OB GYN.  I will see her again in 6 months for a routine follow up with labs.   THERAPY PLAN:  NCCN guidelines for surveillance of Stage I-IV Epithelial Ovarian Cancer/ Fallopian Tube Cancer/ Primary Peritoneal Cancer recommends the following:  A. Vists every 2-4 months for first 2 years, then 3-6 months for years 3-5, and then annually after 5 years.  B. Physical exam including pelvic exam  C. CA-125 or other tumor markers every visit if initially elevated.  D. Refer for genetic  risk evaluation if not previously performed.   E. CBC and chemistry profile as indicated.   F. CT Chest/Abdomen/Pelvis, MRI, PET-CT, or PET (Category 2B for PET) as clinically indicated.  G. Chest X-ray as indicted.   Orders Placed This Encounter  Procedures  . CBC with Differential    Standing Status: Future     Number of Occurrences:      Standing Expiration Date: 09/30/2015  . Comprehensive metabolic panel    Standing Status: Future     Number of Occurrences:      Standing Expiration Date: 09/30/2015  . CA 125    Standing Status: Future     Number of Occurrences:      Standing Expiration Date: 09/30/2015    All questions were answered. The patient knows to call the clinic with any problems, questions or concerns. We can certainly see the patient much sooner if necessary.  This document serves as a record of services personally  performed by Ancil Linsey, MD. It was created on her behalf by Arlyce Harman, a trained medical scribe. The creation of this record is based on the scribe's personal observations and the provider's statements to them. This document has been checked and approved by the attending provider.  I have reviewed the above documentation for accuracy and completeness, and I agree with the above.  This note is electronically signed by: Kelby Fam. Penland, MD  09/30/2014 2:26 PM

## 2014-10-01 LAB — CA 125: CA 125: 22.2 U/mL (ref 0.0–38.1)

## 2014-10-11 ENCOUNTER — Encounter (HOSPITAL_COMMUNITY): Payer: Self-pay

## 2014-10-11 ENCOUNTER — Encounter (HOSPITAL_COMMUNITY)
Admission: RE | Admit: 2014-10-11 | Discharge: 2014-10-11 | Disposition: A | Discharge status: Home / Self Care | Payer: Medicare Other | Source: Ambulatory Visit | Attending: Surgery | Admitting: Surgery

## 2014-10-11 DIAGNOSIS — Z01812 Encounter for preprocedural laboratory examination: Secondary | ICD-10-CM | POA: Insufficient documentation

## 2014-10-11 DIAGNOSIS — Z01818 Encounter for other preprocedural examination: Secondary | ICD-10-CM | POA: Insufficient documentation

## 2014-10-11 DIAGNOSIS — K432 Incisional hernia without obstruction or gangrene: Secondary | ICD-10-CM | POA: Diagnosis not present

## 2014-10-11 DIAGNOSIS — R001 Bradycardia, unspecified: Secondary | ICD-10-CM | POA: Diagnosis not present

## 2014-10-11 DIAGNOSIS — I10 Essential (primary) hypertension: Secondary | ICD-10-CM | POA: Diagnosis not present

## 2014-10-11 HISTORY — DX: Dry eye syndrome of bilateral lacrimal glands: H04.123

## 2014-10-11 LAB — COMPREHENSIVE METABOLIC PANEL
ALK PHOS: 57 U/L (ref 38–126)
ALT: 39 U/L (ref 14–54)
AST: 26 U/L (ref 15–41)
Albumin: 3.7 g/dL (ref 3.5–5.0)
Anion gap: 10 (ref 5–15)
BUN: 17 mg/dL (ref 6–20)
CALCIUM: 9.3 mg/dL (ref 8.9–10.3)
CO2: 25 mmol/L (ref 22–32)
CREATININE: 0.73 mg/dL (ref 0.44–1.00)
Chloride: 106 mmol/L (ref 101–111)
Glucose, Bld: 92 mg/dL (ref 65–99)
Potassium: 4.3 mmol/L (ref 3.5–5.1)
Sodium: 141 mmol/L (ref 135–145)
TOTAL PROTEIN: 6.6 g/dL (ref 6.5–8.1)
Total Bilirubin: 0.5 mg/dL (ref 0.3–1.2)

## 2014-10-11 LAB — CBC WITH DIFFERENTIAL/PLATELET
Basophils Absolute: 0 10*3/uL (ref 0.0–0.1)
Basophils Relative: 0 %
EOS PCT: 2 %
Eosinophils Absolute: 0.1 10*3/uL (ref 0.0–0.7)
HCT: 42 % (ref 36.0–46.0)
HEMOGLOBIN: 13.9 g/dL (ref 12.0–15.0)
LYMPHS ABS: 1.9 10*3/uL (ref 0.7–4.0)
LYMPHS PCT: 38 %
MCH: 32.9 pg (ref 26.0–34.0)
MCHC: 33.1 g/dL (ref 30.0–36.0)
MCV: 99.3 fL (ref 78.0–100.0)
Monocytes Absolute: 0.4 10*3/uL (ref 0.1–1.0)
Monocytes Relative: 8 %
Neutro Abs: 2.6 10*3/uL (ref 1.7–7.7)
Neutrophils Relative %: 52 %
PLATELETS: 182 10*3/uL (ref 150–400)
RBC: 4.23 MIL/uL (ref 3.87–5.11)
RDW: 14 % (ref 11.5–15.5)
WBC: 5.1 10*3/uL (ref 4.0–10.5)

## 2014-10-11 NOTE — Pre-Procedure Instructions (Signed)
Sarah Walker  10/11/2014     Your procedure is scheduled on : Tuesday October 15, 2014 at 7:30 AM.  Report to [facility] Admitting at 5:30 AM.  Call this number if you have problems the morning of surgery: [phone]   Remember:  Do not eat food or drink liquids after midnight.  Take these medicines the morning of surgery with A SIP OF WATER : Eye drops, Bisoprolol (Ziac), Fexofenadine (Allegra), Flonase, Hydrocodone if needed   Please stop taking any vitamins, herbal medications, Meloxicam/Mobic, Ibuprofen, Advil, Motrin, Aleve    Do not wear jewelry, make-up or nail polish.  Do not wear lotions, powders, or perfumes.    Do not shave 48 hours prior to surgery.    Do not bring valuables to the hospital.  [facility] is not responsible for any belongings or valuables.  Contacts, dentures or bridgework may not be worn into surgery.  Leave your suitcase in the car.  After surgery it may be brought to your room.  For patients admitted to the hospital, discharge time will be determined by your treatment team.  Patients discharged the day of surgery will not be allowed to drive home.   Name and phone number of your driver:    Special instructions:  Shower using CHG soap the night before and the morning of your surgery  Please read over the following fact sheets that you were given. Pain Booklet, Coughing and Deep Breathing and Surgical Site Infection Prevention

## 2014-10-11 NOTE — Progress Notes (Signed)
PCP is [name]  Patient denied having any acute cardiac or pulmonary issues  Patient informed Nurse that she had lab work on 09/30/14.  Nurse informed patient that lab work was only good for 14 days, and that she would need to have labs obtained on today. Patient then informed Nurse that she hated being stuck because her veins were hard to find.   Lab tech obtained patients lab work without any difficulty. Patient left PAT with a smile.

## 2014-10-15 ENCOUNTER — Encounter (HOSPITAL_COMMUNITY)
Admission: RE | Disposition: A | Discharge status: Home / Self Care | Payer: Self-pay | Source: Ambulatory Visit | Attending: Surgery

## 2014-10-15 ENCOUNTER — Encounter (HOSPITAL_COMMUNITY): Payer: Self-pay | Admitting: *Deleted

## 2014-10-15 ENCOUNTER — Inpatient Hospital Stay (HOSPITAL_COMMUNITY): Payer: Medicare Other | Admitting: Certified Registered Nurse Anesthetist

## 2014-10-15 ENCOUNTER — Inpatient Hospital Stay (HOSPITAL_COMMUNITY)
Admission: RE | Admit: 2014-10-15 | Discharge: 2014-10-19 | DRG: 354 | Disposition: A | Discharge status: Home / Self Care | Payer: Medicare Other | Source: Ambulatory Visit | Attending: Surgery | Admitting: Surgery

## 2014-10-15 DIAGNOSIS — Z86711 Personal history of pulmonary embolism: Secondary | ICD-10-CM | POA: Diagnosis not present

## 2014-10-15 DIAGNOSIS — I10 Essential (primary) hypertension: Secondary | ICD-10-CM | POA: Diagnosis not present

## 2014-10-15 DIAGNOSIS — Z23 Encounter for immunization: Secondary | ICD-10-CM

## 2014-10-15 DIAGNOSIS — Z6841 Body Mass Index (BMI) 40.0 and over, adult: Secondary | ICD-10-CM

## 2014-10-15 DIAGNOSIS — N289 Disorder of kidney and ureter, unspecified: Secondary | ICD-10-CM | POA: Diagnosis not present

## 2014-10-15 DIAGNOSIS — K432 Incisional hernia without obstruction or gangrene: Principal | ICD-10-CM | POA: Diagnosis present

## 2014-10-15 DIAGNOSIS — E875 Hyperkalemia: Secondary | ICD-10-CM | POA: Diagnosis present

## 2014-10-15 DIAGNOSIS — Z8543 Personal history of malignant neoplasm of ovary: Secondary | ICD-10-CM

## 2014-10-15 DIAGNOSIS — Z90710 Acquired absence of both cervix and uterus: Secondary | ICD-10-CM

## 2014-10-15 DIAGNOSIS — Z79899 Other long term (current) drug therapy: Secondary | ICD-10-CM | POA: Diagnosis not present

## 2014-10-15 DIAGNOSIS — K430 Incisional hernia with obstruction, without gangrene: Secondary | ICD-10-CM | POA: Diagnosis present

## 2014-10-15 HISTORY — PX: INCISIONAL HERNIA REPAIR: SHX193

## 2014-10-15 SURGERY — REPAIR, HERNIA, INCISIONAL
Anesthesia: General | Site: Abdomen

## 2014-10-15 MED ORDER — MIDAZOLAM HCL 5 MG/5ML IJ SOLN
INTRAMUSCULAR | Status: DC | PRN
  Administered 2014-10-15: 2 mg via INTRAVENOUS

## 2014-10-15 MED ORDER — METHOCARBAMOL 500 MG PO TABS
500.0000 mg | ORAL_TABLET | Freq: Four times a day (QID) | ORAL | Status: DC | PRN
  Administered 2014-10-17 – 2014-10-18 (×4): 500 mg via ORAL
  Filled 2014-10-15 (×5): qty 1

## 2014-10-15 MED ORDER — HYDROMORPHONE HCL 1 MG/ML IJ SOLN
INTRAMUSCULAR | Status: AC
  Filled 2014-10-15: qty 1

## 2014-10-15 MED ORDER — ONDANSETRON HCL 4 MG/2ML IJ SOLN
4.0000 mg | Freq: Four times a day (QID) | INTRAMUSCULAR | Status: DC | PRN
  Filled 2014-10-15: qty 2

## 2014-10-15 MED ORDER — MIDAZOLAM HCL 2 MG/2ML IJ SOLN
INTRAMUSCULAR | Status: AC
  Filled 2014-10-15: qty 4

## 2014-10-15 MED ORDER — CHLORHEXIDINE GLUCONATE 0.12 % MT SOLN
15.0000 mL | Freq: Two times a day (BID) | OROMUCOSAL | Status: DC
  Administered 2014-10-15 – 2014-10-19 (×8): 15 mL via OROMUCOSAL
  Filled 2014-10-15 (×6): qty 15

## 2014-10-15 MED ORDER — SODIUM CHLORIDE 0.9 % IJ SOLN: 9.0000 mL | INTRAMUSCULAR | Status: DC | PRN

## 2014-10-15 MED ORDER — DEXAMETHASONE SODIUM PHOSPHATE 4 MG/ML IJ SOLN
INTRAMUSCULAR | Status: DC | PRN
  Administered 2014-10-15: 4 mg via INTRAVENOUS

## 2014-10-15 MED ORDER — EPHEDRINE SULFATE 50 MG/ML IJ SOLN
INTRAMUSCULAR | Status: AC
Start: 2014-10-15 — End: 2014-10-15
  Filled 2014-10-15: qty 2

## 2014-10-15 MED ORDER — ONDANSETRON HCL 4 MG/2ML IJ SOLN
4.0000 mg | Freq: Four times a day (QID) | INTRAMUSCULAR | Status: DC | PRN
  Administered 2014-10-15: 4 mg via INTRAVENOUS

## 2014-10-15 MED ORDER — SODIUM CHLORIDE 0.9 % IJ SOLN
INTRAMUSCULAR | Status: AC
  Filled 2014-10-15: qty 10

## 2014-10-15 MED ORDER — NEOSTIGMINE METHYLSULFATE 10 MG/10ML IV SOLN
INTRAVENOUS | Status: DC | PRN
Start: 2014-10-15 — End: 2014-10-15
  Administered 2014-10-15: 5 mg via INTRAVENOUS

## 2014-10-15 MED ORDER — NEOSTIGMINE METHYLSULFATE 10 MG/10ML IV SOLN
INTRAVENOUS | Status: AC
Start: 2014-10-15 — End: 2014-10-15
  Filled 2014-10-15: qty 1

## 2014-10-15 MED ORDER — SODIUM CHLORIDE 0.9 % IJ SOLN
INTRAMUSCULAR | Status: AC
  Filled 2014-10-15: qty 20

## 2014-10-15 MED ORDER — IRBESARTAN 75 MG PO TABS: 75.0000 mg | ORAL_TABLET | Freq: Every day | ORAL | Status: DC

## 2014-10-15 MED ORDER — STERILE WATER FOR INJECTION IJ SOLN
INTRAMUSCULAR | Status: AC
  Filled 2014-10-15: qty 20

## 2014-10-15 MED ORDER — SUCCINYLCHOLINE CHLORIDE 20 MG/ML IJ SOLN
INTRAMUSCULAR | Status: DC | PRN
  Administered 2014-10-15: 120 mg via INTRAVENOUS

## 2014-10-15 MED ORDER — LIDOCAINE HCL (CARDIAC) 20 MG/ML IV SOLN
INTRAVENOUS | Status: AC
  Filled 2014-10-15: qty 20

## 2014-10-15 MED ORDER — OXYCODONE HCL 5 MG PO TABS
5.0000 mg | ORAL_TABLET | ORAL | Status: DC | PRN
  Administered 2014-10-15: 5 mg via ORAL
  Administered 2014-10-16 (×2): 10 mg via ORAL
  Administered 2014-10-16: 5 mg via ORAL
  Administered 2014-10-17 – 2014-10-18 (×4): 10 mg via ORAL
  Administered 2014-10-18: 5 mg via ORAL
  Administered 2014-10-18 – 2014-10-19 (×2): 10 mg via ORAL
  Filled 2014-10-15 (×10): qty 2

## 2014-10-15 MED ORDER — DIPHENHYDRAMINE HCL 50 MG/ML IJ SOLN: 12.5000 mg | Freq: Four times a day (QID) | INTRAMUSCULAR | Status: DC | PRN

## 2014-10-15 MED ORDER — KCL IN DEXTROSE-NACL 20-5-0.9 MEQ/L-%-% IV SOLN
INTRAVENOUS | Status: DC
  Filled 2014-10-15 (×3): qty 1000

## 2014-10-15 MED ORDER — ACETAMINOPHEN 650 MG RE SUPP: 650.0000 mg | Freq: Four times a day (QID) | RECTAL | Status: DC | PRN

## 2014-10-15 MED ORDER — FENTANYL CITRATE (PF) 250 MCG/5ML IJ SOLN
INTRAMUSCULAR | Status: AC
  Filled 2014-10-15: qty 5

## 2014-10-15 MED ORDER — DIPHENHYDRAMINE HCL 25 MG PO TABS: 25.0000 mg | ORAL_TABLET | Freq: Four times a day (QID) | ORAL | Status: DC | PRN

## 2014-10-15 MED ORDER — POLYETHYLENE GLYCOL 3350 17 G PO PACK: 17.0000 g | PACK | Freq: Every day | ORAL | Status: DC | PRN

## 2014-10-15 MED ORDER — DEXAMETHASONE SODIUM PHOSPHATE 4 MG/ML IJ SOLN
INTRAMUSCULAR | Status: AC
  Filled 2014-10-15: qty 1

## 2014-10-15 MED ORDER — PNEUMOCOCCAL VAC POLYVALENT 25 MCG/0.5ML IJ INJ
0.5000 mL | INJECTION | INTRAMUSCULAR | Status: AC
  Administered 2014-10-16: 0.5 mL via INTRAMUSCULAR
  Filled 2014-10-15: qty 0.5

## 2014-10-15 MED ORDER — ROCURONIUM BROMIDE 50 MG/5ML IV SOLN
INTRAVENOUS | Status: AC
  Filled 2014-10-15: qty 1

## 2014-10-15 MED ORDER — SUCCINYLCHOLINE CHLORIDE 20 MG/ML IJ SOLN
INTRAMUSCULAR | Status: AC
  Filled 2014-10-15: qty 2

## 2014-10-15 MED ORDER — CEFAZOLIN SODIUM-DEXTROSE 2-3 GM-% IV SOLR
2.0000 g | Freq: Three times a day (TID) | INTRAVENOUS | Status: AC
  Administered 2014-10-15: 2 g via INTRAVENOUS
  Filled 2014-10-15: qty 50

## 2014-10-15 MED ORDER — 0.9 % SODIUM CHLORIDE (POUR BTL) OPTIME
TOPICAL | Status: DC | PRN
  Administered 2014-10-15: 1000 mL

## 2014-10-15 MED ORDER — ACETAMINOPHEN 325 MG PO TABS: 650.0000 mg | ORAL_TABLET | Freq: Four times a day (QID) | ORAL | Status: DC | PRN

## 2014-10-15 MED ORDER — HYDRALAZINE HCL 20 MG/ML IJ SOLN: 10.0000 mg | INTRAMUSCULAR | Status: DC | PRN

## 2014-10-15 MED ORDER — LIDOCAINE HCL (CARDIAC) 20 MG/ML IV SOLN
INTRAVENOUS | Status: DC | PRN
  Administered 2014-10-15: 60 mg via INTRAVENOUS

## 2014-10-15 MED ORDER — DIPHENHYDRAMINE HCL 50 MG/ML IJ SOLN
INTRAMUSCULAR | Status: AC
  Filled 2014-10-15: qty 1

## 2014-10-15 MED ORDER — LORATADINE 10 MG PO TABS
10.0000 mg | ORAL_TABLET | Freq: Every day | ORAL | Status: DC
  Administered 2014-10-17 – 2014-10-19 (×2): 10 mg via ORAL
  Filled 2014-10-15 (×3): qty 1

## 2014-10-15 MED ORDER — ONDANSETRON HCL 4 MG/2ML IJ SOLN
INTRAMUSCULAR | Status: DC | PRN
  Administered 2014-10-15: 4 mg via INTRAVENOUS

## 2014-10-15 MED ORDER — METHYLENE BLUE 1 % INJ SOLN
INTRAMUSCULAR | Status: DC | PRN
  Administered 2014-10-15: 20 mg via INTRAVENOUS

## 2014-10-15 MED ORDER — GLYCOPYRROLATE 0.2 MG/ML IJ SOLN
INTRAMUSCULAR | Status: DC | PRN
  Administered 2014-10-15: .8 mg via INTRAVENOUS

## 2014-10-15 MED ORDER — KCL IN DEXTROSE-NACL 20-5-0.45 MEQ/L-%-% IV SOLN
INTRAVENOUS | Status: AC
  Administered 2014-10-15: 1000 mL
  Filled 2014-10-15: qty 1000

## 2014-10-15 MED ORDER — VECURONIUM BROMIDE 10 MG IV SOLR
INTRAVENOUS | Status: DC | PRN
  Administered 2014-10-15 (×2): 2 mg via INTRAVENOUS
  Administered 2014-10-15: 1 mg via INTRAVENOUS

## 2014-10-15 MED ORDER — PROPOFOL 10 MG/ML IV BOLUS
INTRAVENOUS | Status: AC
  Filled 2014-10-15: qty 20

## 2014-10-15 MED ORDER — HYDROMORPHONE 0.3 MG/ML IV SOLN
INTRAVENOUS | Status: DC
  Administered 2014-10-15 (×2): via INTRAVENOUS
  Administered 2014-10-15: 3.6 mg via INTRAVENOUS
  Administered 2014-10-16 (×2): 1.8 mg via INTRAVENOUS
  Administered 2014-10-16: 2.4 mg via INTRAVENOUS
  Filled 2014-10-15 (×2): qty 25

## 2014-10-15 MED ORDER — CEFAZOLIN SODIUM-DEXTROSE 2-3 GM-% IV SOLR
INTRAVENOUS | Status: AC
  Filled 2014-10-15: qty 50

## 2014-10-15 MED ORDER — ENOXAPARIN SODIUM 40 MG/0.4ML ~~LOC~~ SOLN
40.0000 mg | SUBCUTANEOUS | Status: DC
  Administered 2014-10-16 – 2014-10-19 (×4): 40 mg via SUBCUTANEOUS
  Filled 2014-10-15 (×4): qty 0.4

## 2014-10-15 MED ORDER — CETYLPYRIDINIUM CHLORIDE 0.05 % MT LIQD
7.0000 mL | Freq: Two times a day (BID) | OROMUCOSAL | Status: DC
  Administered 2014-10-16: 7 mL via OROMUCOSAL

## 2014-10-15 MED ORDER — EPHEDRINE SULFATE 50 MG/ML IJ SOLN
INTRAMUSCULAR | Status: DC | PRN
  Administered 2014-10-15: 10 mg via INTRAVENOUS
  Administered 2014-10-15 (×2): 5 mg via INTRAVENOUS

## 2014-10-15 MED ORDER — ZOLPIDEM TARTRATE 5 MG PO TABS
5.0000 mg | ORAL_TABLET | Freq: Every evening | ORAL | Status: DC | PRN
  Administered 2014-10-18: 5 mg via ORAL
  Filled 2014-10-15: qty 1

## 2014-10-15 MED ORDER — METHYLENE BLUE 1 % INJ SOLN
INTRAMUSCULAR | Status: AC
  Filled 2014-10-15: qty 10

## 2014-10-15 MED ORDER — LACTATED RINGERS IV SOLN
INTRAVENOUS | Status: DC | PRN
  Administered 2014-10-15 (×2): via INTRAVENOUS

## 2014-10-15 MED ORDER — HYDROMORPHONE HCL 1 MG/ML IJ SOLN
0.2500 mg | INTRAMUSCULAR | Status: DC | PRN
  Administered 2014-10-15 (×4): 0.5 mg via INTRAVENOUS

## 2014-10-15 MED ORDER — GLYCOPYRROLATE 0.2 MG/ML IJ SOLN
INTRAMUSCULAR | Status: AC
  Filled 2014-10-15: qty 3

## 2014-10-15 MED ORDER — BUPIVACAINE LIPOSOME 1.3 % IJ SUSP
INTRAMUSCULAR | Status: DC | PRN
  Administered 2014-10-15: 10 mL

## 2014-10-15 MED ORDER — OXYCODONE HCL 5 MG PO TABS
ORAL_TABLET | ORAL | Status: AC
  Filled 2014-10-15: qty 1

## 2014-10-15 MED ORDER — SIMETHICONE 80 MG PO CHEW
40.0000 mg | CHEWABLE_TABLET | Freq: Four times a day (QID) | ORAL | Status: DC | PRN
  Administered 2014-10-16 – 2014-10-18 (×2): 40 mg via ORAL
  Filled 2014-10-15 (×2): qty 1

## 2014-10-15 MED ORDER — BUPIVACAINE-EPINEPHRINE (PF) 0.25% -1:200000 IJ SOLN
INTRAMUSCULAR | Status: AC
  Filled 2014-10-15: qty 30

## 2014-10-15 MED ORDER — FLUTICASONE PROPIONATE 50 MCG/ACT NA SUSP: 1.0000 | Freq: Every day | NASAL | Status: DC | PRN

## 2014-10-15 MED ORDER — ROCURONIUM BROMIDE 100 MG/10ML IV SOLN
INTRAVENOUS | Status: DC | PRN
  Administered 2014-10-15: 20 mg via INTRAVENOUS
  Administered 2014-10-15: 30 mg via INTRAVENOUS

## 2014-10-15 MED ORDER — BISOPROLOL-HYDROCHLOROTHIAZIDE 5-6.25 MG PO TABS
1.0000 | ORAL_TABLET | Freq: Every day | ORAL | Status: DC
  Administered 2014-10-16 – 2014-10-19 (×4): 1 via ORAL
  Filled 2014-10-15 (×5): qty 1

## 2014-10-15 MED ORDER — DIPHENHYDRAMINE HCL 50 MG/ML IJ SOLN
12.5000 mg | Freq: Four times a day (QID) | INTRAMUSCULAR | Status: DC | PRN
  Administered 2014-10-15: 12.5 mg via INTRAVENOUS

## 2014-10-15 MED ORDER — BUPIVACAINE LIPOSOME 1.3 % IJ SUSP
20.0000 mL | INTRAMUSCULAR | Status: DC
  Filled 2014-10-15: qty 20

## 2014-10-15 MED ORDER — NALOXONE HCL 0.4 MG/ML IJ SOLN: 0.4000 mg | INTRAMUSCULAR | Status: DC | PRN

## 2014-10-15 MED ORDER — DIPHENHYDRAMINE HCL 12.5 MG/5ML PO ELIX: 12.5000 mg | ORAL_SOLUTION | Freq: Four times a day (QID) | ORAL | Status: DC | PRN

## 2014-10-15 MED ORDER — FENTANYL CITRATE (PF) 100 MCG/2ML IJ SOLN
INTRAMUSCULAR | Status: DC | PRN
  Administered 2014-10-15: 25 ug via INTRAVENOUS
  Administered 2014-10-15: 50 ug via INTRAVENOUS
  Administered 2014-10-15: 25 ug via INTRAVENOUS
  Administered 2014-10-15: 100 ug via INTRAVENOUS
  Administered 2014-10-15: 50 ug via INTRAVENOUS

## 2014-10-15 MED ORDER — ARTIFICIAL TEARS OP OINT
TOPICAL_OINTMENT | OPHTHALMIC | Status: AC
  Filled 2014-10-15: qty 3.5

## 2014-10-15 MED ORDER — DOCUSATE SODIUM 100 MG PO CAPS
100.0000 mg | ORAL_CAPSULE | Freq: Two times a day (BID) | ORAL | Status: DC
  Administered 2014-10-15 – 2014-10-19 (×8): 100 mg via ORAL
  Filled 2014-10-15 (×8): qty 1

## 2014-10-15 MED ORDER — SODIUM CHLORIDE 0.9 % IJ SOLN
INTRAMUSCULAR | Status: DC | PRN
  Administered 2014-10-15: 20 mL via INTRAVENOUS

## 2014-10-15 MED ORDER — ONDANSETRON 4 MG PO TBDP
4.0000 mg | ORAL_TABLET | Freq: Four times a day (QID) | ORAL | Status: DC | PRN
  Administered 2014-10-17: 4 mg via ORAL
  Filled 2014-10-15: qty 1

## 2014-10-15 MED ORDER — HYDROMORPHONE 0.3 MG/ML IV SOLN
INTRAVENOUS | Status: AC
  Filled 2014-10-15: qty 25

## 2014-10-15 MED ORDER — DEXTROSE 5 % IV SOLN
3.0000 g | INTRAVENOUS | Status: AC
  Administered 2014-10-15: 3 g via INTRAVENOUS
  Filled 2014-10-15: qty 3000

## 2014-10-15 MED ORDER — ONDANSETRON HCL 4 MG/2ML IJ SOLN
INTRAMUSCULAR | Status: AC
  Filled 2014-10-15: qty 2

## 2014-10-15 MED ORDER — PROPOFOL 10 MG/ML IV BOLUS
INTRAVENOUS | Status: DC | PRN
  Administered 2014-10-15: 140 mg via INTRAVENOUS

## 2014-10-15 SURGICAL SUPPLY — 52 items
APPLIER CLIP 9.375 MED OPEN (MISCELLANEOUS) ×2
APR CLP MED 9.3 20 MLT OPN (MISCELLANEOUS) ×1
BINDER ABDOMINAL 12 ML 46-62 (SOFTGOODS) ×1 IMPLANT
CANISTER SUCTION 2500CC (MISCELLANEOUS) ×2 IMPLANT
CHLORAPREP W/TINT 26ML (MISCELLANEOUS) ×2 IMPLANT
CLIP APPLIE 9.375 MED OPEN (MISCELLANEOUS) IMPLANT
COVER SURGICAL LIGHT HANDLE (MISCELLANEOUS) ×2 IMPLANT
DECANTER SPIKE VIAL GLASS SM (MISCELLANEOUS) ×2 IMPLANT
DEVICE SECURE STRAP 25 ABSORB (INSTRUMENTS) ×1 IMPLANT
DRAIN CHANNEL 19F RND (DRAIN) ×1 IMPLANT
DRAPE LAPAROSCOPIC ABDOMINAL (DRAPES) ×2 IMPLANT
DRSG OPSITE POSTOP 4X10 (GAUZE/BANDAGES/DRESSINGS) ×1 IMPLANT
ELECT CAUTERY BLADE 6.4 (BLADE) ×2 IMPLANT
ELECT REM PT RETURN 9FT ADLT (ELECTROSURGICAL) ×2
ELECTRODE REM PT RTRN 9FT ADLT (ELECTROSURGICAL) ×1 IMPLANT
EVACUATOR SILICONE 100CC (DRAIN) ×1 IMPLANT
GAUZE SPONGE 4X4 12PLY STRL (GAUZE/BANDAGES/DRESSINGS) ×1 IMPLANT
GLOVE BIO SURGEON STRL SZ8 (GLOVE) ×3 IMPLANT
GLOVE BIOGEL PI IND STRL 6.5 (GLOVE) IMPLANT
GLOVE BIOGEL PI IND STRL 7.5 (GLOVE) IMPLANT
GLOVE BIOGEL PI IND STRL 8 (GLOVE) ×1 IMPLANT
GLOVE BIOGEL PI IND STRL 8.5 (GLOVE) IMPLANT
GLOVE BIOGEL PI INDICATOR 6.5 (GLOVE) ×2
GLOVE BIOGEL PI INDICATOR 7.5 (GLOVE) ×1
GLOVE BIOGEL PI INDICATOR 8 (GLOVE) ×1
GLOVE BIOGEL PI INDICATOR 8.5 (GLOVE) ×1
GLOVE ECLIPSE 7.5 STRL STRAW (GLOVE) ×2 IMPLANT
GOWN STRL REUS W/ TWL LRG LVL3 (GOWN DISPOSABLE) ×1 IMPLANT
GOWN STRL REUS W/ TWL XL LVL3 (GOWN DISPOSABLE) ×1 IMPLANT
GOWN STRL REUS W/TWL LRG LVL3 (GOWN DISPOSABLE) ×2
GOWN STRL REUS W/TWL XL LVL3 (GOWN DISPOSABLE) ×2
KIT BASIN OR (CUSTOM PROCEDURE TRAY) ×2 IMPLANT
KIT ROOM TURNOVER OR (KITS) ×2 IMPLANT
LIQUID BAND (GAUZE/BANDAGES/DRESSINGS) IMPLANT
MARKER SKIN DUAL TIP RULER LAB (MISCELLANEOUS) ×1 IMPLANT
MESH ULTRAPRO 12X12 30X30CM (Mesh General) ×1 IMPLANT
NDL HYPO 25GX1X1/2 BEV (NEEDLE) IMPLANT
NEEDLE HYPO 25GX1X1/2 BEV (NEEDLE) ×2 IMPLANT
NS IRRIG 1000ML POUR BTL (IV SOLUTION) ×2 IMPLANT
PACK GENERAL/GYN (CUSTOM PROCEDURE TRAY) ×2 IMPLANT
PAD ARMBOARD 7.5X6 YLW CONV (MISCELLANEOUS) ×2 IMPLANT
STAPLER VISISTAT 35W (STAPLE) IMPLANT
SUT ETHILON 2 0 FS 18 (SUTURE) ×1 IMPLANT
SUT MON AB 4-0 PC3 18 (SUTURE) IMPLANT
SUT NOVA NAB DX-16 0-1 5-0 T12 (SUTURE) IMPLANT
SUT PDS AB 1 CTX 36 (SUTURE) ×6 IMPLANT
SUT VIC AB 2-0 CT1 36 (SUTURE) ×1 IMPLANT
SYR BULB IRRIGATION 50ML (SYRINGE) ×1 IMPLANT
SYR CONTROL 10ML LL (SYRINGE) ×1 IMPLANT
TOWEL OR 17X24 6PK STRL BLUE (TOWEL DISPOSABLE) ×1 IMPLANT
TOWEL OR 17X26 10 PK STRL BLUE (TOWEL DISPOSABLE) ×2 IMPLANT
TRAY FOLEY CATH 16FRSI W/METER (SET/KITS/TRAYS/PACK) ×1 IMPLANT

## 2014-10-15 NOTE — H&P (Signed)
H&P   Sarah Walker (MR# [id number])      H&P Info    Author Note Status Last Update User Last Update Date/Time   Erroll Luna, MD Signed Erroll Luna, MD 08/19/2014 12:22 PM    H&P    Expand All Collapse All   Sarah Walker 08/19/2014 11:17 AM Location: [facility] Patient #: [id number] DOB: 1950/09/18 Married / Language: English / Race: White Female History of Present Illness Sarah Moores A. Antha Niday MD; 08/19/2014 12:20 PM) Patient words: ventral hernia   Pt sent at the request of Derrek Monaco for incisional hernia. The patient has a history of stage IA papillary serous cystadenocarcinoma status post surgical resection followed by adjuvant chemotherapy consisting of 6 cycles of carboplatin and paclitaxel from 07/15/2011 through 11/04/2011. At the time of presentation her cancer marker was elevated and remained elevated after surgery. It normalized with chemotherapy. She had a hx of PE and was on coumadin but this was stopped. Pt has noticed pain and bulge at her old incision site. Pain is moderate and no bowel problems reported.         CLINICAL DATA: Patient states the left lower quadrant feels different. No pain. Prior hysterectomy. History of right-sided ovarian cancer diagnosed 2013, treated with chemotherapy and surgery. EXAM: CT ABDOMEN AND PELVIS WITH CONTRAST TECHNIQUE: Multidetector CT imaging of the abdomen and pelvis was performed using the standard protocol following bolus administration of intravenous contrast. CONTRAST: 12mL OMNIPAQUE IOHEXOL 300 MG/ML SOLN COMPARISON: CT abdomen pelvis 05/15/2012 FINDINGS: Lower chest: Dependent atelectasis within the bilateral lower lobes. Hepatobiliary: Multiple low-attenuation hepatic lesions, unchanged from prior, favored represent hepatic cysts. Patient status post cholecystectomy. No intrahepatic or extrahepatic biliary ductal dilatation. Pancreas: Unremarkable Spleen: Unremarkable  Adrenals/Urinary Tract: Normal adrenal glands. Kidneys are symmetric in size. No hydronephrosis. Urinary bladder is unremarkable. Stomach/Bowel: No abnormal bowel wall thickening or evidence for bowel obstruction. No free fluid or free intraperitoneal air. Vascular/Lymphatic: Circumaortic left renal vein. Normal caliber abdominal aorta. No retroperitoneal lymphadenopathy. Surgical clips within the retroperitoneum. Other: Patient status post TAHBSO. Re- demonstrated fat containing ventral lower anterior abdominal wall hernia (image 85; series 2). Additionally there has been interval increase in size of fat containing left anterior abdominal wall hernia with an opening measuring 4.4 cm (image 56; series 2). Additionally more inferiorly and medially there is a new small bowel containing ventral abdominal wall hernia with an opening measuring 6.3 cm. Musculoskeletal: Lower thoracic and lumbar spine degenerative changes. No aggressive or acute appearing osseous lesions. IMPRESSION: Interval development of a broad mouth small bowel containing lower anterior abdominal wall hernia, new from prior. Additionally slightly more superiorly and laterally is an enlarging fat containing ventral abdominal wall hernia. The more anterior lower abdominal wall hernia is stable when compared to prior examination and currently contains fat. No evidence for acute bowel process or bowel obstruction. Electronically Signed By: Lovey Newcomer M.D. On: 07/19/2014 09:33 .  The patient is a 64 year old female   Other Problems Ventura Sellers, CMA; 08/19/2014 11:17 AM) Arthritis High blood pressure Oophorectomy Left. Ovarian Cancer  Past Surgical History Ventura Sellers, [place]; 08/19/2014 11:17 AM) Gallbladder Surgery - Laparoscopic Hysterectomy (due to cancer) - Complete Tonsillectomy  Diagnostic Studies History Ventura Sellers, [place]; 08/19/2014 11:17 AM) Colonoscopy never Mammogram within last  year  Allergies Ventura Sellers, CMA; 08/19/2014 11:18 AM) Codeine Phosphate *ANALGESICS - OPIOID*  Medication History Ventura Sellers, CMA; 08/19/2014 11:19 AM) Azelastine HCl (0.05% Solution, Ophthalmic) Active. Bisoprolol-Hydrochlorothiazide (  5-6.25MG  Tablet, Oral) Active. Meloxicam (15MG  Tablet, Oral) Active. Potassium Chloride Crys ER (20MEQ Tablet ER, Oral) Active. Valsartan (80MG  Tablet, Oral) Active. Allegra-D Allergy & Congestion (60-120MG  Tablet ER 12HR, Oral) Active. Align (Oral) Active.  Social History Ventura Sellers, [place]; 08/19/2014 11:17 AM) Caffeine use Carbonated beverages, Coffee, Tea. No alcohol use No drug use Tobacco use Never smoker.  Family History Ventura Sellers, [place]; 08/19/2014 11:17 AM) Cancer Father. Cervical Cancer Mother. Family history unknown First Degree Relatives  Pregnancy / Birth History Ventura Sellers, [place]; 08/19/2014 11:17 AM) Age at menarche 72 years. Age of menopause <44 Gravida 3 Maternal age 78-30 Para 2 Regular periods     Review of Systems Sharyn Lull R. Brooks CMA; 08/19/2014 11:17 AM) General Not Present- Appetite Loss, Chills, Fatigue, Fever, Night Sweats, Weight Gain and Weight Loss. Skin Not Present- Change in Wart/Mole, Dryness, Hives, Jaundice, New Lesions, Non-Healing Wounds, Rash and Ulcer. HEENT Present- Seasonal Allergies. Not Present- Earache, Hearing Loss, Hoarseness, Nose Bleed, Oral Ulcers, Ringing in the Ears, Sinus Pain, Sore Throat, Visual Disturbances, Wears glasses/contact lenses and Yellow Eyes. Respiratory Not Present- Bloody sputum, Chronic Cough, Difficulty Breathing, Snoring and Wheezing. Breast Not Present- Breast Mass, Breast Pain, Nipple Discharge and Skin Changes. Cardiovascular Not Present- Chest Pain, Difficulty Breathing Lying Down, Leg Cramps, Palpitations, Rapid Heart Rate, Shortness of Breath and Swelling of Extremities. Gastrointestinal Not Present- Abdominal Pain,  Bloating, Bloody Stool, Change in Bowel Habits, Chronic diarrhea, Constipation, Difficulty Swallowing, Excessive gas, Gets full quickly at meals, Hemorrhoids, Indigestion, Nausea, Rectal Pain and Vomiting. Female Genitourinary Not Present- Frequency, Nocturia, Painful Urination, Pelvic Pain and Urgency. Musculoskeletal Present- Joint Stiffness. Not Present- Back Pain, Joint Pain, Muscle Pain, Muscle Weakness and Swelling of Extremities. Neurological Not Present- Decreased Memory, Fainting, Headaches, Numbness, Seizures, Tingling, Tremor, Trouble walking and Weakness. Psychiatric Not Present- Anxiety, Bipolar, Change in Sleep Pattern, Depression, Fearful and Frequent crying. Endocrine Not Present- Cold Intolerance, Excessive Hunger, Hair Changes, Heat Intolerance, Hot flashes and New Diabetes. Hematology Not Present- Easy Bruising, Excessive bleeding, Gland problems, HIV and Persistent Infections.  Vitals [employer] R. Brooks CMA; 08/19/2014 11:17 AM) 08/19/2014 11:17 AM Weight: 224.25 lb Height: 62in Body Surface Area: 2.11 m Body Mass Index: 41.02 kg/m BP: 132/84 (Sitting, Left Arm, Standard)     Physical Exam (Jobeth Pangilinan A. Deneisha Dade MD; 08/19/2014 12:21 PM)  General Mental Status-Alert. General Appearance-Consistent with stated age. Hydration-Well hydrated. Voice-Normal.  Head and Neck Head-normocephalic, atraumatic with no lesions or palpable masses. Trachea-midline. Thyroid Gland Characteristics - normal size and consistency.  Eye Eyeball - Bilateral-Extraocular movements intact. Sclera/Conjunctiva - Bilateral-No scleral icterus.  Chest and Lung Exam Chest and lung exam reveals -quiet, even and easy respiratory effort with no use of accessory muscles and on auscultation, normal breath sounds, no adventitious sounds and normal vocal resonance. Inspection Chest Wall - Normal. Back - normal.  Cardiovascular Cardiovascular examination reveals -normal heart  sounds, regular rate and rhythm with no murmurs and normal pedal pulses bilaterally.  Abdomen Note: soft NT ND two reducible incisional hernia 4 cm or so in size soft reducible    Neurologic Neurologic evaluation reveals -alert and oriented x 3 with no impairment of recent or remote memory. Mental Status-Normal.  Musculoskeletal Normal Exam - Left-Upper Extremity Strength Normal and Lower Extremity Strength Normal. Normal Exam - Right-Upper Extremity Strength Normal and Lower Extremity Strength Normal.    Assessment & Plan (Devora Tortorella A. Dovie Kapusta MD; 08/19/2014 11:38 AM)  INCISIONAL HERNIA, WITHOUT OBSTRUCTION OR GANGRENE (553.21  K43.2) Impression: REOMMEND  OPEN REPAIR WITH MYOFASCIAL ADVANCEMENT FLAP AND MESH NEEDS COLONOSCOPY PRIOR TO SURGERY SINCE THIS IS SET UP FOR OCTOBER The risk of hernia repair include bleeding, infection, organ injury, bowel injury, bladder injury, nerve injury recurrent hernia, blood clots, worsening of underlying condition, chronic pain, mesh use, open surgery, death, and the need for other operations. Pt agrees to proceed  Current Plans The anatomy and the physiology was discussed. The pathophysiology and natural history of the disease was discussed. Options were discussed and recommendations were made. Technique, risks, benefits, & alternatives were discussed. Risks such as stroke, heart attack, bleeding, indection, death, and other risks discussed. Questions answered. The patient agrees to proceed. The anatomy & physiology of the abdominal wall was discussed. The pathophysiology of hernias was discussed. Natural history risks without surgery including progeressive enlargement, pain, incarceration, & strangulation was discussed. Contributors to complications such as smoking, obesity, diabetes, prior surgery, etc were discussed.  I feel the risks of no intervention will lead to serious problems that outweigh the operative risks; therefore, I recommended  surgery to reduce and repair the hernia. I explained laparoscopic techniques with possible need for an open approach. I noted the probable use of mesh to patch and/or buttress the hernia repair  Risks such as bleeding, infection, abscess, need for further treatment, heart attack, death, and other risks were discussed. I noted a good likelihood this will help address the problem. Goals of post-operative recovery were discussed as well. Possibility that this will not correct all symptoms was explained. I stressed the importance of low-impact activity, aggressive pain control, avoiding constipation, & not pushing through pain to minimize risk of post-operative chronic pain or injury. Possibility of reherniation especially with smoking, obesity, diabetes, immunosuppression, and other health conditions was discussed. We will work to minimize complications.  An educational handout further explaining the pathology & treatment options was given as well. Questions were answered. The patient expresses understanding & wishes to proceed with surgery. Pt Education - Incisional hernia: discussed with patient and provided information. Pt Education - [facility] Abdominal Surgery (Gross) Pt Education - [facility] Umbilical/ Inguinal Hernia HCI

## 2014-10-15 NOTE — Op Note (Signed)
Preoperative diagnosis: Complex incisional hernia lower abdomen incarcerated  Postoperative diagnosis: Same  Procedure: Repair of incisional hernia with mesh and myofascial advancement flaps 30 cm   Surgeon: Erroll Luna M.D.  Anesthesia: Gen. endotracheal anesthesia with 10 mL Exparel local   EBL: 40 mL  Drains: 19 round drain to preperitoneal space  Specimens: None  IV fluids: 2 L crystalloid  Indications  for procedure: The patient is a 64 year old female who had a previous laparotomy for ovarian cancer. She is developed multiple fascial defects from her umbilicus down to the pubic symphysis. Some were incarcerated with omentum. We discussed open and laparoscopic repairs. We discussed complications of each. We discussed watchful waiting. She was to proceed with open repair with mesh of her incisional hernia.The risk of hernia repair include bleeding,  Infection,   Recurrence of the hernia,  Mesh use, chronic pain,  Organ injury, bladder injury   Bowel injury,  Bladder injury,   nerve injury with numbness around the incision,  Death,  and worsening of preexisting  medical problems.  The alternatives to surgery have been discussed as well..  Long term expectations of both operative and non operative treatments have been discussed.   The patient agrees to proceed.  Description of procedure: The patient was met in the holding area and questions are answered. She was taken back to the operating room and placed upon the OR table. After induction of general endotracheal anesthesia, a Foley catheter was placed under sterile conditions. She received 3 g of Ancef. She was then prepped and draped in a sterile fashion. Timeout was done. Lower midline incision was used through the old scar from the umbilicus down to the pubic symphysis. Dissection was carried down to the fascia was identified and was opened just below the umbilicus in the midline. Ureteral to enter the abdominal cavity with minimal  adhesions. There were multiple defects involving the lower incision and one contained omentum that was incarcerated. This was dissected out cautery and reduced. We took her dissection all way down to the pubic symphysis. A Foley catheter was felt the bladder. The retrorectus space was developed by using the cautery. This was taken all way down to the pubic symphysis and the bladder was carefully pushed away from this without injury. Methylene blue was administered IV by anesthesia and there is no evidence of bladder injury as the urine turn blue. This retrorectus space was developed by releasing the posterior sheath above the umbilicus of the rectus muscle and continuing to below the pubis symphysis bilaterally. Small vessels were clipped diastases. Once the space was developed the posterior elements of tissue were closed with a running #1 PDS. This provided ample release of the rectus muscles for approximately 30 cm. A 30 cm x 15 cm piece of old pro mesh was placed back on the pubic symphysis inferiorly and then in a retro-rectus space circumferentially with at least 5 cm of overlap.  Absorbable tacker was used to secure the mesh circle vaginally to the undersurface of the rectus muscle. Irrigation was used and suctioned out.  No tacks were placed behind the pubis. There is ample overlap of the mesh by this. Irrigation was used and suctioned out. I reexamined the area below the pubis insult no evidence of bladder injury nor blue dye to indicate that. 19 round drain placed through separate stab incision and right lower quadrant by the rectus muscles. This was secured by 2-0 nylon. Fascia was then closed with #1 PDS in a running  fashion. 2-0 Vicryl used to close subcutaneous fat and skin staples used to close the skin. There is no evidence of bowel injury during the case by examination prior to closure. All sponge, instruments and needles were found to be correct this portion case. Dressing applied. Abdominal binder  applied. Patient was awoke, extubated taken to recovery in satisfactory condition.

## 2014-10-15 NOTE — Transfer of Care (Signed)
Immediate Anesthesia Transfer of Care Note  Patient: Sarah Walker  Procedure(s) Performed: Procedure(s): OPEN INCISIONAL HERNIA REPAIR WITH MESH AND MYOFASCIAL FLAPS  Patient Location: PACU  Anesthesia Type:General  Level of Consciousness: awake and alert   Airway & Oxygen Therapy: Patient Spontanous Breathing and Patient connected to face mask oxygen  Post-op Assessment: Report given to RN and Post -op Vital signs reviewed and stable  Post vital signs: Reviewed and stable  Last Vitals:  Filed Vitals:   10/15/14 0640  BP: 147/92  Pulse: 57  Temp: 36.6 C  Resp: 20    Complications: No apparent anesthesia complications

## 2014-10-15 NOTE — Anesthesia Postprocedure Evaluation (Signed)
  Anesthesia Post-op Note  Patient: Sarah Walker  Procedure(s) Performed: Procedure(s): OPEN INCISIONAL HERNIA REPAIR WITH MESH AND MYOFASCIAL FLAPS  Patient Location: PACU  Anesthesia Type:General  Level of Consciousness: awake and alert   Airway and Oxygen Therapy: Patient Spontanous Breathing  Post-op Pain: Controlled  Post-op Assessment: Post-op Vital signs reviewed, Patient's Cardiovascular Status Stable and Respiratory Function Stable  Post-op Vital Signs: Reviewed  Filed Vitals:   10/15/14 1220  BP:   Pulse: 52  Temp:   Resp: 10    Complications: No apparent anesthesia complications

## 2014-10-15 NOTE — Anesthesia Preprocedure Evaluation (Addendum)
Anesthesia Evaluation  Patient identified by MRN, date of birth, ID band Patient awake    Reviewed: Allergy & Precautions, H&P , NPO status , Patient's Chart, lab work & pertinent test results, reviewed documented beta blocker date and time   Airway Mallampati: III  TM Distance: >3 FB Neck ROM: Full    Dental no notable dental hx. (+) Teeth Intact, Dental Advisory Given   Pulmonary neg pulmonary ROS,    Pulmonary exam normal breath sounds clear to auscultation       Cardiovascular hypertension, Pt. on medications and Pt. on home beta blockers  Rhythm:Regular Rate:Normal     Neuro/Psych negative neurological ROS  negative psych ROS   GI/Hepatic negative GI ROS, Neg liver ROS,   Endo/Other  Morbid obesity  Renal/GU negative Renal ROS  negative genitourinary   Musculoskeletal   Abdominal   Peds  Hematology negative hematology ROS (+) anemia ,   Anesthesia Other Findings   Reproductive/Obstetrics negative OB ROS                            Anesthesia Physical Anesthesia Plan  ASA: III  Anesthesia Plan: General   Post-op Pain Management:    Induction: Intravenous  Airway Management Planned: Oral ETT  Additional Equipment:   Intra-op Plan:   Post-operative Plan: Extubation in OR  Informed Consent: I have reviewed the patients History and Physical, chart, labs and discussed the procedure including the risks, benefits and alternatives for the proposed anesthesia with the patient or authorized representative who has indicated his/her understanding and acceptance.   Dental advisory given  Plan Discussed with: CRNA  Anesthesia Plan Comments:         Anesthesia Quick Evaluation

## 2014-10-15 NOTE — Progress Notes (Signed)
receivee report from [employer] as caregiver

## 2014-10-15 NOTE — Interval H&P Note (Signed)
History and Physical Interval Note:  10/15/2014 7:12 AM  Sarah Walker  has presented today for surgery, with the diagnosis of Incisional Hernia  The various methods of treatment have been discussed with the patient and family. After consideration of risks, benefits and other options for treatment, the patient has consented to  Procedure(s): OPEN INCISIONAL HERNIA REPAIR WITH MESH AND MYOFASCIAL FLAPS (N/A) as a surgical intervention .  The patient's history has been reviewed, patient examined, no change in status, stable for surgery.  I have reviewed the patient's chart and labs.  Questions were answered to the patient's satisfaction.     Rodneshia Greenhouse A.

## 2014-10-15 NOTE — Progress Notes (Signed)
Report given to Vauda Salvucci rn as caregiver 

## 2014-10-15 NOTE — Anesthesia Procedure Notes (Signed)
Procedure Name: Intubation Date/Time: 10/15/2014 7:35 AM Performed by: [place] Pink Pre-anesthesia Checklist: Patient identified, Emergency Drugs available, Suction available, Patient being monitored and Timeout performed Patient Re-evaluated:Patient Re-evaluated prior to inductionOxygen Delivery Method: Circle system utilized Preoxygenation: Pre-oxygenation with 100% oxygen Intubation Type: IV induction Ventilation: Mask ventilation without difficulty Laryngoscope Size: Mac and 3 Grade View: Grade I Tube type: Oral Tube size: 7.0 mm Number of attempts: 1 Airway Equipment and Method: Stylet and LTA kit utilized Placement Confirmation: ETT inserted through vocal cords under direct vision,  positive ETCO2 and breath sounds checked- equal and bilateral Secured at: 20 cm Tube secured with: Tape Dental Injury: Teeth and Oropharynx as per pre-operative assessment

## 2014-10-15 NOTE — Care Management Note (Signed)
Case Management Note  Patient Details  Name: Sarah Walker MRN: [id number] Date of Birth: 21-Aug-1950  Subjective/Objective:                    Action/Plan:  Initial UR completed.  Expected Discharge Date:                  Expected Discharge Plan:  Home/Self Care  In-House Referral:     Discharge planning Services     Post Acute Care Choice:    Choice offered to:     DME Arranged:    DME Agency:     HH Arranged:    HH Agency:     Status of Service:  In process, will continue to follow  Medicare Important Message Given:    Date Medicare IM Given:    Medicare IM give by:    Date Additional Medicare IM Given:    Additional Medicare Important Message give by:     If discussed at [place] of Stay Meetings, dates discussed:    Additional Comments:  Marilu Favre, RN 10/15/2014, 2:42 PM

## 2014-10-16 ENCOUNTER — Encounter (HOSPITAL_COMMUNITY): Payer: Self-pay | Admitting: Surgery

## 2014-10-16 DIAGNOSIS — Z6841 Body Mass Index (BMI) 40.0 and over, adult: Secondary | ICD-10-CM | POA: Diagnosis not present

## 2014-10-16 DIAGNOSIS — Z79899 Other long term (current) drug therapy: Secondary | ICD-10-CM | POA: Diagnosis not present

## 2014-10-16 DIAGNOSIS — Z8543 Personal history of malignant neoplasm of ovary: Secondary | ICD-10-CM | POA: Diagnosis not present

## 2014-10-16 DIAGNOSIS — Z86711 Personal history of pulmonary embolism: Secondary | ICD-10-CM | POA: Diagnosis not present

## 2014-10-16 DIAGNOSIS — Z90710 Acquired absence of both cervix and uterus: Secondary | ICD-10-CM | POA: Diagnosis not present

## 2014-10-16 DIAGNOSIS — K432 Incisional hernia without obstruction or gangrene: Secondary | ICD-10-CM | POA: Diagnosis not present

## 2014-10-16 DIAGNOSIS — N289 Disorder of kidney and ureter, unspecified: Secondary | ICD-10-CM | POA: Diagnosis not present

## 2014-10-16 DIAGNOSIS — Z23 Encounter for immunization: Secondary | ICD-10-CM | POA: Diagnosis not present

## 2014-10-16 DIAGNOSIS — E875 Hyperkalemia: Secondary | ICD-10-CM | POA: Diagnosis not present

## 2014-10-16 DIAGNOSIS — I10 Essential (primary) hypertension: Secondary | ICD-10-CM | POA: Diagnosis not present

## 2014-10-16 LAB — COMPREHENSIVE METABOLIC PANEL
ALBUMIN: 3.7 g/dL (ref 3.5–5.0)
ALK PHOS: 57 U/L (ref 38–126)
ALT: 27 U/L (ref 14–54)
ANION GAP: 9 (ref 5–15)
AST: 26 U/L (ref 15–41)
BILIRUBIN TOTAL: 0.7 mg/dL (ref 0.3–1.2)
BUN: 19 mg/dL (ref 6–20)
CALCIUM: 8.8 mg/dL — AB (ref 8.9–10.3)
CO2: 26 mmol/L (ref 22–32)
Chloride: 101 mmol/L (ref 101–111)
Creatinine, Ser: 1.42 mg/dL — ABNORMAL HIGH (ref 0.44–1.00)
GFR calc non Af Amer: 38 mL/min — ABNORMAL LOW (ref >60)
GFR, EST AFRICAN AMERICAN: 44 mL/min — AB (ref >60)
Glucose, Bld: 142 mg/dL — ABNORMAL HIGH (ref 65–99)
Potassium: 5.6 mmol/L — ABNORMAL HIGH (ref 3.5–5.1)
SODIUM: 136 mmol/L (ref 135–145)
TOTAL PROTEIN: 6.6 g/dL (ref 6.5–8.1)

## 2014-10-16 LAB — CBC
HCT: 41.3 % (ref 36.0–46.0)
HEMOGLOBIN: 13.3 g/dL (ref 12.0–15.0)
MCH: 32.8 pg (ref 26.0–34.0)
MCHC: 32.2 g/dL (ref 30.0–36.0)
MCV: 102 fL — ABNORMAL HIGH (ref 78.0–100.0)
Platelets: 229 10*3/uL (ref 150–400)
RBC: 4.05 MIL/uL (ref 3.87–5.11)
RDW: 14.3 % (ref 11.5–15.5)
WBC: 12.7 10*3/uL — ABNORMAL HIGH (ref 4.0–10.5)

## 2014-10-16 MED ORDER — DEXTROSE-NACL 5-0.9 % IV SOLN
INTRAVENOUS | Status: DC
  Administered 2014-10-16 – 2014-10-18 (×5): via INTRAVENOUS

## 2014-10-16 MED ORDER — SODIUM CHLORIDE 0.9 % IV BOLUS (SEPSIS)
1000.0000 mL | Freq: Once | INTRAVENOUS | Status: AC
  Administered 2014-10-16: 1000 mL via INTRAVENOUS

## 2014-10-16 MED ORDER — FUROSEMIDE 10 MG/ML IJ SOLN
20.0000 mg | Freq: Once | INTRAMUSCULAR | Status: AC
  Administered 2014-10-16: 20 mg via INTRAVENOUS
  Filled 2014-10-16: qty 2

## 2014-10-16 MED ORDER — HYDROMORPHONE HCL 1 MG/ML IJ SOLN
1.0000 mg | INTRAMUSCULAR | Status: DC | PRN
  Administered 2014-10-16 – 2014-10-18 (×7): 1 mg via INTRAVENOUS
  Filled 2014-10-16 (×8): qty 1

## 2014-10-16 MED ORDER — OXYCODONE-ACETAMINOPHEN 7.5-325 MG PO TABS: 1.0000 | ORAL_TABLET | ORAL | Status: DC | PRN

## 2014-10-16 NOTE — Progress Notes (Signed)
1 Day Post-Op  Subjective: Pt feels ok  Good pain control   Objective: Vital signs in last 24 hours: Temp:  [97.2 F (36.2 C)-98.2 F (36.8 C)] 98.1 F (36.7 C) (10/12 0206) Pulse Rate:  [52-86] 75 (10/12 0206) Resp:  [6-22] 18 (10/12 0559) BP: (110-178)/(61-122) 111/73 mmHg (10/12 0206) SpO2:  [90 %-100 %] 96 % (10/12 0559) Weight:  [101.152 kg (223 lb)] 101.152 kg (223 lb) (10/11 1440) Last BM Date: 10/14/14  Intake/Output from previous day: 10/11 0701 - 10/12 0700 In: 3220 [P.O.:60; I.V.:3160] Out: 900 [Urine:690; Drains:135; Blood:75] Intake/Output this shift:    Incision/Wound:dressing with minimal drainage  JP serous no blue dye  Urine blue green from methylene blue dye  No dye in JP  Lab Results:   Recent Labs  10/16/14 0425  WBC 12.7*  HGB 13.3  HCT 41.3  PLT 229   BMET  Recent Labs  10/16/14 0425  NA 136  K 5.6*  CL 101  CO2 26  GLUCOSE 142*  BUN 19  CREATININE 1.42*  CALCIUM 8.8*   PT/INR No results for input(s): LABPROT, INR in the last 72 hours. ABG No results for input(s): PHART, HCO3 in the last 72 hours.  Invalid input(s): PCO2, PO2  Studies/Results: No results found.  Anti-infectives: Anti-infectives    Start     Dose/Rate Route Frequency Ordered Stop   10/15/14 1600  ceFAZolin (ANCEF) IVPB 2 g/50 mL premix     2 g 100 mL/hr over 30 Minutes Intravenous 3 times per day 10/15/14 1514 10/15/14 1747   10/15/14 0800  ceFAZolin (ANCEF) 3 g in dextrose 5 % 50 mL IVPB     3 g 160 mL/hr over 30 Minutes Intravenous To Surgery 10/15/14 0750 10/15/14 0754      Assessment/Plan: s/p Procedure(s): OPEN INCISIONAL HERNIA REPAIR WITH MESH AND MYOFASCIAL FLAPS Hyperkalemia            Change IV fluids and give 20 mg lasix ARI         Will increase IVF and monitor    Urine still blue from methylene blue dye so will diuresis   KEEP FOLEY FOR STRICT I AND O   OOB ambulate   D/C PCA   ADVANCE DIET     LOS: 1 day    Sarah Walker  A. 10/16/2014

## 2014-10-16 NOTE — Progress Notes (Signed)
MD Cornett paged in regard to receiving faxed paperwork of patient lab results from [facility] employee health. Paperwork placed in patients chart.

## 2014-10-16 NOTE — Clinical Documentation Improvement (Signed)
General Surgery  Please further specify ARI and document findings in next progress note and include in discharge summary if applicable.   Acute renal insufficiency  Acute renal failure  Other  Clinically Undetermined  Supporting Information:  Hyperkalemia also documented  Creatinine elevated at 1.42; appears baselines are running at 0.73, 0.76 within last 2 weeks  Will increase IV fluids and monitor  Please exercise your independent, professional judgment when responding. A specific answer is not anticipated or expected.  Thank You, Zoila Shutter BSN, [address] [phone]

## 2014-10-17 LAB — COMPREHENSIVE METABOLIC PANEL
ALBUMIN: 2.8 g/dL — AB (ref 3.5–5.0)
ALK PHOS: 44 U/L (ref 38–126)
ALT: 17 U/L (ref 14–54)
ANION GAP: 10 (ref 5–15)
AST: 28 U/L (ref 15–41)
BUN: 16 mg/dL (ref 6–20)
CHLORIDE: 103 mmol/L (ref 101–111)
CO2: 28 mmol/L (ref 22–32)
Calcium: 8.7 mg/dL — ABNORMAL LOW (ref 8.9–10.3)
Creatinine, Ser: 0.89 mg/dL (ref 0.44–1.00)
Glucose, Bld: 118 mg/dL — ABNORMAL HIGH (ref 65–99)
POTASSIUM: 4.4 mmol/L (ref 3.5–5.1)
SODIUM: 141 mmol/L (ref 135–145)
Total Bilirubin: 0.7 mg/dL (ref 0.3–1.2)
Total Protein: 5.9 g/dL — ABNORMAL LOW (ref 6.5–8.1)

## 2014-10-17 MED ORDER — ALPRAZOLAM 0.25 MG PO TABS
0.2500 mg | ORAL_TABLET | Freq: Every day | ORAL | Status: DC | PRN
  Administered 2014-10-17: 0.25 mg via ORAL
  Filled 2014-10-17: qty 1

## 2014-10-17 NOTE — Progress Notes (Signed)
2 Days Post-Op  Subjective: Some belching  No flatus  No vomiting  Objective: Vital signs in last 24 hours: Temp:  [97.8 F (36.6 C)-98.5 F (36.9 C)] 98.1 F (36.7 C) (10/13 0436) Pulse Rate:  [63-81] 75 (10/13 0436) Resp:  [18] 18 (10/13 0436) BP: (98-139)/(56-81) 112/56 mmHg (10/13 0436) SpO2:  [95 %-100 %] 95 % (10/13 0436) Last BM Date: 10/14/14  Intake/Output from previous day: 10/12 0701 - 10/13 0700 In: 2993.3 [P.O.:510; I.V.:2483.3] Out: 1744 [Urine:1600; Drains:144] Intake/Output this shift: Total I/O In: 2723.3 [P.O.:240; I.V.:2483.3] Out: 684 [Urine:600; Drains:84]  Incision/Wound:incision CDI  JP serous  BS present   ND   Lab Results:   Recent Labs  10/16/14 0425  WBC 12.7*  HGB 13.3  HCT 41.3  PLT 229   BMET  Recent Labs  10/16/14 0425 10/17/14 0429  NA 136 141  K 5.6* 4.4  CL 101 103  CO2 26 28  GLUCOSE 142* 118*  BUN 19 16  CREATININE 1.42* 0.89  CALCIUM 8.8* 8.7*   PT/INR No results for input(s): LABPROT, INR in the last 72 hours. ABG No results for input(s): PHART, HCO3 in the last 72 hours.  Invalid input(s): PCO2, PO2  Studies/Results: No results found.  Anti-infectives: Anti-infectives    Start     Dose/Rate Route Frequency Ordered Stop   10/15/14 1600  ceFAZolin (ANCEF) IVPB 2 g/50 mL premix     2 g 100 mL/hr over 30 Minutes Intravenous 3 times per day 10/15/14 1514 10/15/14 1747   10/15/14 0800  ceFAZolin (ANCEF) 3 g in dextrose 5 % 50 mL IVPB     3 g 160 mL/hr over 30 Minutes Intravenous To Surgery 10/15/14 0750 10/15/14 0754      Assessment/Plan: s/p Procedure(s): OPEN INCISIONAL HERNIA REPAIR WITH MESH AND MYOFASCIAL FLAPS D/C foley OOB Advance diet as tolerated Keep JP   LOS: 2 days    Sarah Walker A. 10/17/2014

## 2014-10-17 NOTE — Discharge Instructions (Signed)
[facility]      [facility] °[phone] ° °OPEN ABDOMINAL SURGERY: POST OP INSTRUCTIONS ° °Always review your discharge instruction sheet given to you by the facility where your surgery was performed. ° °IF YOU HAVE DISABILITY OR FAMILY LEAVE FORMS, YOU MUST BRING THEM TO THE OFFICE FOR PROCESSING.  PLEASE DO NOT GIVE THEM TO YOUR DOCTOR. ° °1. A prescription for pain medication may be given to you upon discharge.  Take your pain medication as prescribed, if needed.  If narcotic pain medicine is not needed, then you may take acetaminophen (Tylenol) or ibuprofen (Advil) as needed. °2. Take your usually prescribed medications unless otherwise directed. °3. If you need a refill on your pain medication, please contact your pharmacy. They will contact our office to request authorization.  Prescriptions will not be filled after 5pm or on week-ends. °4. You should follow a light diet the first few days after arrival home, such as soup and crackers, pudding, etc.unless your doctor has advised otherwise. A high-fiber, low fat diet can be resumed as tolerated.   Be sure to include lots of fluids daily. Most patients will experience some swelling and bruising on the chest and neck area.  Ice packs will help.  Swelling and bruising can take several days to resolve °5. Most patients will experience some swelling and bruising in the area of the incision. Ice pack will help. Swelling and bruising can take several days to resolve..  °6. It is common to experience some constipation if taking pain medication after surgery.  Increasing fluid intake and taking a stool softener will usually help or prevent this problem from occurring.  A mild laxative (Milk of Magnesia or Miralax) should be taken according to package directions if there are no bowel movements after 48 hours. °7.  You may have steri-strips (small skin tapes) in place directly over the incision.  These strips should be left on the skin for 7-10 days.  If your  surgeon used skin glue on the incision, you may shower in 24 hours.  The glue will flake off over the next 2-3 weeks.  Any sutures or staples will be removed at the office during your follow-up visit. You may find that a light gauze bandage over your incision may keep your staples from being rubbed or pulled. You may shower and replace the bandage daily. °8. ACTIVITIES:  You may resume regular (light) daily activities beginning the next day--such as daily self-care, walking, climbing stairs--gradually increasing activities as tolerated.  You may have sexual intercourse when it is comfortable.  Refrain from any heavy lifting or straining until approved by your doctor. °a. You may drive when you no longer are taking prescription pain medication, you can comfortably wear a seatbelt, and you can safely maneuver your car and apply brakes °b. Return to Work: ___________________________________ °9. You should see your doctor in the office for a follow-up appointment approximately two weeks after your surgery.  Make sure that you call for this appointment within a day or two after you arrive home to insure a convenient appointment time. °OTHER INSTRUCTIONS:  °_____________________________________________________________ °_____________________________________________________________ ° °WHEN TO CALL YOUR DOCTOR: °1. Fever over 101.0 °2. Inability to urinate °3. Nausea and/or vomiting °4. Extreme swelling or bruising °5. Continued bleeding from incision. °6. Increased pain, redness, or drainage from the incision. °7. Difficulty swallowing or breathing °8. Muscle cramping or spasms. °9. Numbness or tingling in hands or feet or around lips. ° °The clinic staff is available to   answer your questions during regular business hours.  Please dont hesitate to call and ask to speak to one of the nurses if you have concerns.  For further questions, please visit [email or web address] Bulb Drain Home Care A bulb drain  consists of a thin rubber tube and a soft, round bulb that creates a gentle suction. The rubber tube is placed in the area where you had surgery. A bulb is attached to the end of the tube that is outside the body. The bulb drain removes excess fluid that normally builds up in a surgical wound after surgery. The color and amount of fluid will vary. Immediately after surgery, the fluid is bright red and is a little thicker than water. It may gradually change to a yellow or pink color and become more thin and water-like. When the amount decreases to about 1 or 2 tbsp in 24 hours, your health care provider will usually remove it. DAILY CARE  Keep the bulb flat (compressed) at all times, except while emptying it. The flatness creates suction. You can flatten the bulb by squeezing it firmly in the middle and then closing the cap.  Keep sites where the tube enters the skin dry and covered with a bandage (dressing).  Secure the tube 1-2 in (2.5-5.1 cm) below the insertion sites to keep it from pulling on your stitches. The tube is stitched in place and will not slip out.  Secure the bulb as directed by your health care provider.  For the first 3 days after surgery, there usually is more fluid in the bulb. Empty the bulb whenever it becomes half full because the bulb does not create enough suction if it is too full. The bulb could also overflow. Write down how much fluid you remove each time you empty your drain. Add up the amount removed in 24 hours.  Empty the bulb at the same time every day once the amount of fluid decreases and you only need to empty it once a day. Write down the amounts and the 24-hour totals to give to your health care provider. This helps your health care provider know when the tubes can be removed. EMPTYING THE BULB DRAIN Before emptying the bulb, get a measuring cup, a piece of paper and a pen, and wash your hands.  Gently run your fingers down the tube (stripping) to empty any  drainage from the tubing into the bulb. This may need to be done several times a day to clear the tubing of clots and tissue.  Open the bulb cap to release suction, which causes it to inflate. Do not touch the inside of the cap.  Gently run your fingers down the tube (stripping) to empty any drainage from the tubing into the bulb.  Hold the cap out of the way, and pour fluid into the measuring cup.   Squeeze the bulb to provide suction.  Replace the cap.   Check the tape that holds the tube to your skin. If it is becoming loose, you can remove the loose piece of tape and apply a new one. Then, pin the bulb to your shirt.   Write down the amount of fluid you emptied out. Write down the date and each time you emptied your bulb drain. (If there are 2 bulbs, note the amount of drainage from each bulb and keep the totals separate. Your health care provider will want to know the total amounts for each drain and which tube is draining more.)   Flush  the fluid down the toilet and wash your hands.   Call your health care provider once you have less than 2 tbsp of fluid collecting in the bulb drain every 24 hours. If there is drainage around the tube site, change dressings and keep the area dry. Cleanse around tube with sterile saline and place dry gauze around site. This gauze should be changed when it is soiled. If it stays clean and unsoiled, it should still be changed daily.  SEEK MEDICAL CARE IF:  Your drainage has a bad smell or is cloudy.   You have a fever.   Your drainage is increasing instead of decreasing.   Your tube fell out.   You have redness or swelling around the tube site.   You have drainage from a surgical wound.   Your bulb drain will not stay flat after you empty it.  MAKE SURE YOU:   Understand these instructions.  Will watch your condition.  Will get help right away if you are not doing well or get worse.   This information is not intended to  replace advice given to you by your health care provider. Make sure you discuss any questions you have with your health care provider.   Document Released: 12/19/1999 Document Revised: 01/11/2014 Document Reviewed: 07/10/2014 Elsevier Interactive Patient Education [employer].

## 2014-10-18 NOTE — Care Management Important Message (Signed)
Important Message  Patient Details  Name: Sarah Walker MRN: [id number] Date of Birth: 1950/05/29   Medicare Important Message Given:  Yes-second notification given    Delorse Lek 10/18/2014, 11:13 AM

## 2014-10-18 NOTE — Progress Notes (Signed)
3 Days Post-Op  Subjective: Some soreness but improving, passing flatus, tol liquids, no emesis, ambulating  Objective: Vital signs in last 24 hours: Temp:  [97.9 F (36.6 C)-98.4 F (36.9 C)] 98.4 F (36.9 C) (10/14 0447) Pulse Rate:  [72-74] 72 (10/14 0447) Resp:  [17-18] 18 (10/14 0447) BP: (114-127)/(66-78) 115/72 mmHg (10/14 0447) SpO2:  [94 %-97 %] 94 % (10/14 0447) Last BM Date: 10/14/14  Intake/Output from previous day: 10/13 0701 - 10/14 0700 In: 2205 [P.O.:840; I.V.:1365] Out: 1170 [Urine:1050; Drains:120] Intake/Output this shift:    Resp: clear to auscultation bilaterally Cardio: regular rate and rhythm GI: soft bs present approp tender drain serous  Lab Results:   Recent Labs  10/16/14 0425  WBC 12.7*  HGB 13.3  HCT 41.3  PLT 229   BMET  Recent Labs  10/16/14 0425 10/17/14 0429  NA 136 141  K 5.6* 4.4  CL 101 103  CO2 26 28  GLUCOSE 142* 118*  BUN 19 16  CREATININE 1.42* 0.89  CALCIUM 8.8* 8.7*   PT/INR No results for input(s): LABPROT, INR in the last 72 hours. ABG No results for input(s): PHART, HCO3 in the last 72 hours.  Invalid input(s): PCO2, PO2  Studies/Results: No results found.  Anti-infectives: Anti-infectives    Start     Dose/Rate Route Frequency Ordered Stop   10/15/14 1600  ceFAZolin (ANCEF) IVPB 2 g/50 mL premix     2 g 100 mL/hr over 30 Minutes Intravenous 3 times per day 10/15/14 1514 10/15/14 1747   10/15/14 0800  ceFAZolin (ANCEF) 3 g in dextrose 5 % 50 mL IVPB     3 g 160 mL/hr over 30 Minutes Intravenous To Surgery 10/15/14 0750 10/15/14 0754      Assessment/Plan: POD 3 open incisional hernia repair  Po pain meds, has been using iv overnight though Regular diet pulm toilet, ambulate, ics Lovenox, scds Plan dc tomorrow if tolerates regular diet and not using iv pain meds  Sarah Walker 10/18/2014

## 2014-10-18 NOTE — Progress Notes (Signed)
Patient had loss of iv access. Patient prefer to not be stuck again since expected discharge tomorrow. Patient has no need for iv use at this time.

## 2014-10-19 MED ORDER — METHOCARBAMOL 500 MG PO TABS: 500.0000 mg | ORAL_TABLET | Freq: Four times a day (QID) | ORAL | Status: DC | PRN

## 2014-10-19 MED ORDER — ZOLPIDEM TARTRATE 5 MG PO TABS: 5.0000 mg | ORAL_TABLET | Freq: Every evening | ORAL | Status: DC | PRN

## 2014-10-19 MED ORDER — OXYCODONE HCL 5 MG PO TABS: 5.0000 mg | ORAL_TABLET | ORAL | Status: DC | PRN

## 2014-10-19 NOTE — Discharge Summary (Signed)
Patient ID: Sarah Walker MRN: [id number] DOB/AGE: 01/28/1950 64 y.o.  Admit date: 10/15/2014 Discharge date: 10/19/2014  Procedures: Repair of incisional hernia with mesh and myofascial advancement flaps 30 cm   Consults: None  Reason for Admission:  Pt sent at the request of Derrek Monaco for incisional hernia. The patient has a history of stage IA papillary serous cystadenocarcinoma status post surgical resection followed by adjuvant chemotherapy consisting of 6 cycles of carboplatin and paclitaxel from 07/15/2011 through 11/04/2011. At the time of presentation her cancer marker was elevated and remained elevated after surgery. It normalized with chemotherapy. She had a hx of PE and was on coumadin but this was stopped. Pt has noticed pain and bulge at her old incision site. Pain is moderate and no bowel problems reported  Admission Diagnoses:  1. Incisional ventral hernia  Hospital Course: the patient was admitted and underwent the above procedure.  She tolerated this well.  She was given clear liquids and her diet was able to be advanced as tolerated.  She was noted to have ARI post op and her urine was still blue from the methylene blue.  She was given IVFs, foley in place, and diuresis.  This resolved and her foley was DC on POD 2.  By POD 4, she was tolerating a solid diet and her pain was well controlled with oral pain meds.  Her JP drain remained serous.  She was stable for dc home.  She was having BMs and passing flatus.  PE: Abd: soft, appropriately tender, midline incision is c/d/i with staples present, honeycomb dressing removed.  JP with serous drainage, +BS  Discharge Diagnoses:  Active Problems:   Incisional hernia, incarcerated s/p repair of incisional hernia with mesh and myofascial advancement flaps  Discharge Medications:   Medication List    STOP taking these medications        HYDROcodone-acetaminophen 7.5-325 MG tablet  Commonly known as:  NORCO       TAKE these medications        ALLEGRA-D 12 HOUR PO  Take 1 tablet by mouth daily as needed (allergies).     azelastine 0.05 % ophthalmic solution  Commonly known as:  OPTIVAR  USE 1 DROP TWICE DAILY     bisoprolol-hydrochlorothiazide 5-6.25 MG tablet  Commonly known as:  ZIAC  Take 1 tablet by mouth daily.     dicyclomine 10 MG capsule  Commonly known as:  BENTYL  Take 1 capsule (10 mg total) by mouth 3 (three) times daily as needed for spasms.     diphenhydrAMINE 25 MG tablet  Commonly known as:  BENADRYL  Take 25 mg by mouth every 6 (six) hours as needed for itching.     fexofenadine 180 MG tablet  Commonly known as:  ALLEGRA  Take 180 mg by mouth daily as needed for allergies or rhinitis.     fluticasone 50 MCG/ACT nasal spray  Commonly known as:  FLONASE  Place 1 spray into both nostrils daily as needed for allergies or rhinitis.     meloxicam 15 MG tablet  Commonly known as:  MOBIC  Take 15 mg by mouth daily.     methocarbamol 500 MG tablet  Commonly known as:  ROBAXIN  Take 1 tablet (500 mg total) by mouth every 6 (six) hours as needed for muscle spasms.     oxyCODONE 5 MG immediate release tablet  Commonly known as:  Oxy IR/ROXICODONE  Take 1-2 tablets (5-10 mg total) by mouth every 4 (  four) hours as needed for moderate pain.     potassium chloride SA 20 MEQ tablet  Commonly known as:  K-DUR,KLOR-CON  take 1 tablet by mouth once daily     valsartan 80 MG tablet  Commonly known as:  DIOVAN  Take 80 mg by mouth daily.     zolpidem 5 MG tablet  Commonly known as:  AMBIEN  Take 1 tablet (5 mg total) by mouth at bedtime as needed for sleep.        Discharge Instructions:     Follow-up Information    Follow up with CORNETT,THOMAS A., MD In 8 days.   Specialty:  General Surgery   Why:  For suture removal, our office should call you   Contact information:   [place] [address] [phone]       Signed: Henreitta Cea 10/19/2014, 8:18 AM

## 2014-10-19 NOTE — Progress Notes (Signed)
Pt discharged to home accomp by husband.  DC instructions reviewed and copy given.  JP care and drainage reviewed with pt and husband.  JP chart given and explained so pt can record drainage and take to the dr. Follow up appts reviewed with pt.  Pt to call on Monday for appt with Dr. Brantley Stage.

## 2014-12-09 DIAGNOSIS — H1045 Other chronic allergic conjunctivitis: Secondary | ICD-10-CM | POA: Diagnosis not present

## 2014-12-09 DIAGNOSIS — Z1389 Encounter for screening for other disorder: Secondary | ICD-10-CM | POA: Diagnosis not present

## 2014-12-09 DIAGNOSIS — Z6839 Body mass index (BMI) 39.0-39.9, adult: Secondary | ICD-10-CM | POA: Diagnosis not present

## 2014-12-09 DIAGNOSIS — J310 Chronic rhinitis: Secondary | ICD-10-CM | POA: Diagnosis not present

## 2015-03-31 ENCOUNTER — Encounter (HOSPITAL_COMMUNITY): Payer: Medicare Other

## 2015-03-31 ENCOUNTER — Encounter (HOSPITAL_COMMUNITY): Payer: Self-pay | Admitting: Hematology & Oncology

## 2015-03-31 ENCOUNTER — Encounter (HOSPITAL_COMMUNITY): Payer: Medicare Other | Attending: Hematology & Oncology | Admitting: Hematology & Oncology

## 2015-03-31 VITALS — BP 133/70 | HR 56 | Temp 98.0°F | Resp 18 | Wt 220.0 lb

## 2015-03-31 DIAGNOSIS — Z8543 Personal history of malignant neoplasm of ovary: Secondary | ICD-10-CM | POA: Diagnosis not present

## 2015-03-31 DIAGNOSIS — Z9889 Other specified postprocedural states: Secondary | ICD-10-CM | POA: Insufficient documentation

## 2015-03-31 DIAGNOSIS — T451X5A Adverse effect of antineoplastic and immunosuppressive drugs, initial encounter: Secondary | ICD-10-CM | POA: Insufficient documentation

## 2015-03-31 DIAGNOSIS — C569 Malignant neoplasm of unspecified ovary: Secondary | ICD-10-CM | POA: Insufficient documentation

## 2015-03-31 DIAGNOSIS — G620 Drug-induced polyneuropathy: Secondary | ICD-10-CM | POA: Diagnosis not present

## 2015-03-31 DIAGNOSIS — C561 Malignant neoplasm of right ovary: Secondary | ICD-10-CM

## 2015-03-31 DIAGNOSIS — Z86711 Personal history of pulmonary embolism: Secondary | ICD-10-CM | POA: Insufficient documentation

## 2015-03-31 DIAGNOSIS — Z90710 Acquired absence of both cervix and uterus: Secondary | ICD-10-CM | POA: Insufficient documentation

## 2015-03-31 DIAGNOSIS — I10 Essential (primary) hypertension: Secondary | ICD-10-CM | POA: Diagnosis not present

## 2015-03-31 DIAGNOSIS — Z9049 Acquired absence of other specified parts of digestive tract: Secondary | ICD-10-CM | POA: Insufficient documentation

## 2015-03-31 DIAGNOSIS — Z9221 Personal history of antineoplastic chemotherapy: Secondary | ICD-10-CM | POA: Diagnosis not present

## 2015-03-31 LAB — COMPREHENSIVE METABOLIC PANEL
ALT: 18 U/L (ref 14–54)
ANION GAP: 6 (ref 5–15)
AST: 19 U/L (ref 15–41)
Albumin: 3.9 g/dL (ref 3.5–5.0)
Alkaline Phosphatase: 68 U/L (ref 38–126)
BILIRUBIN TOTAL: 0.5 mg/dL (ref 0.3–1.2)
BUN: 23 mg/dL — ABNORMAL HIGH (ref 6–20)
CALCIUM: 8.9 mg/dL (ref 8.9–10.3)
CO2: 26 mmol/L (ref 22–32)
Chloride: 109 mmol/L (ref 101–111)
Creatinine, Ser: 0.84 mg/dL (ref 0.44–1.00)
GFR calc non Af Amer: >60 mL/min (ref >60)
Glucose, Bld: 88 mg/dL (ref 65–99)
Potassium: 4.9 mmol/L (ref 3.5–5.1)
Sodium: 141 mmol/L (ref 135–145)
TOTAL PROTEIN: 6.9 g/dL (ref 6.5–8.1)

## 2015-03-31 LAB — CBC WITH DIFFERENTIAL/PLATELET
Basophils Absolute: 0 10*3/uL (ref 0.0–0.1)
Basophils Relative: 0 %
EOS PCT: 5 %
Eosinophils Absolute: 0.2 10*3/uL (ref 0.0–0.7)
HEMATOCRIT: 40.3 % (ref 36.0–46.0)
Hemoglobin: 13.3 g/dL (ref 12.0–15.0)
LYMPHS ABS: 1.9 10*3/uL (ref 0.7–4.0)
LYMPHS PCT: 38 %
MCH: 32.4 pg (ref 26.0–34.0)
MCHC: 33 g/dL (ref 30.0–36.0)
MCV: 98.3 fL (ref 78.0–100.0)
MONO ABS: 0.3 10*3/uL (ref 0.1–1.0)
Monocytes Relative: 6 %
NEUTROS ABS: 2.5 10*3/uL (ref 1.7–7.7)
Neutrophils Relative %: 51 %
PLATELETS: 167 10*3/uL (ref 150–400)
RBC: 4.1 MIL/uL (ref 3.87–5.11)
RDW: 13.8 % (ref 11.5–15.5)
WBC: 5 10*3/uL (ref 4.0–10.5)

## 2015-03-31 NOTE — Patient Instructions (Addendum)
[place] at [facility] Discharge Instructions  RECOMMENDATIONS MADE BY THE CONSULTANT AND ANY TEST RESULTS WILL BE SENT TO YOUR REFERRING PHYSICIAN.   Exam and discussion by Dr Whitney Muse today Labs are good, CA-125 is still pending  Return to see the doctor in 6 months with labs  If you have not had genetic testing yet, then we will contact you and get you set up to see Roma Kayser when she comes here. Please call the clinic if you have any questions or concerns    Thank you for choosing [place] at [facility] to provide your oncology and hematology care.  To afford each patient quality time with our provider, please arrive at least 15 minutes before your scheduled appointment time.   Beginning January 23rd 2017 lab work for the [facility] will be done in the  Main lab at [facility] on 1st floor. If you have a lab appointment with the [facility] please come in thru the  Main Entrance and check in at the main information desk  You need to re-schedule your appointment should you arrive 10 or more minutes late.  We strive to give you quality time with our providers, and arriving late affects you and other patients whose appointments are after yours.  Also, if you no show three or more times for appointments you may be dismissed from the clinic at the providers discretion.     Again, thank you for choosing [facility].  Our hope is that these requests will decrease the amount of time that you wait before being seen by our physicians.       _____________________________________________________________  Should you have questions after your visit to [facility], please contact our office at [phone] between the hours of 8:30 a.m. and 4:30 p.m.  Voicemails left after 4:30 p.m. will not be returned until the following business day.  For prescription refill requests, have your pharmacy contact our office.          Resources For Cancer Patients and their Caregivers ? American Cancer Society: Can assist with transportation, wigs, general needs, runs Look Good Feel Better.        571-107-7226 ? Cancer Care: Provides financial assistance, online support groups, medication/co-pay assistance.  1-800-813-HOPE (463)832-5519) ? [place] Assists [place] cancer patients and their families through emotional , educational and financial support.  (270)707-3317 ? [place] DSS Where to apply for food stamps, Medicaid and utility assistance. 386-515-7157 ? [facility]: Transportation to medical appointments. 330-609-6272 ? Social Security Administration: May apply for disability if have a Stage IV cancer. [phone] ? [employer], Disability and Transit Services: Assists with nutrition, care and transit needs. 613-101-9585

## 2015-03-31 NOTE — Progress Notes (Signed)
Sarah Walker, [place] [address]  Ovarian cancer on right [facility]) - Plan: CBC with Differential, Comprehensive metabolic panel, CA 630  CURRENT THERAPY: Surveillance per NCCN guidelines  INTERVAL HISTORY: Sarah Walker 65 y.o. female returns for followup of stage IA papillary serous cystadenocarcinoma status post surgical resection followed by adjuvant chemotherapy consisting of 6 cycles of carboplatin and paclitaxel from 07/15/2011 through 11/04/2011. At the time of presentation her cancer marker was elevated and remained elevated after surgery. It normalized with chemotherapy.   This lady's presentation in May of 1601 was complicated by pulmonary emboli. An IVC filter was placed initially prior surgery and was removed in December 2013.  Anticoagulation was discontinued following one year's worth of anticoagulation.   Sarah Walker is here alone today. Her cancer was originally found due to an onset of vaginal bleeding. Her surgery was performed in [place] by Dr. Charissa Bash.  Her last mammogram was performed in May. She is up to date on her colonoscopies, with Dr. Laural Golden.   She had hernia surgery in October 2016 without any major issue. She did experience hot flashes which began in her back and radiated to her arms and hands in the first couple months after the surgery. This has improved over time.  She continues to experience neuropathy, worse in her feet than her hands. This neuropathy is unchanged. She denies falling or stumbling, noting she is careful. While receiving chemotherapy, the neuropathy came up to her knees but that did not last. Now the neuropathy affects the balls of her feet and the tips of her hands.  Dr. Hilma Favors orders her screening mammograms. She is up to date on her screening colonoscopies. She has received a pneumonia shot and plans to get a shingles vaccine in the near future. She has had a pap smear with her physician.  Her  bowels and appetite are good.   She believes she had genetic testing but is not absolutely sure. She has a daughter.      Ovarian cancer on right [facility])   05/25/2011 Surgery Debulking surgery at [facility] in [facility]   05/25/2011 Initial Diagnosis Ovarian cancer   07/15/2011 - 11/04/2011 Chemotherapy Carboplatin/Pacliatexel x 6 cycles   12/01/2011 Remission CT scan negative for disease   05/15/2012 Imaging CT abd/pelvis- Negative for mass or adenopathy.    I personally reviewed and went over laboratory results with the patient.  The results are noted within this dictation.  Oncologically, she denies any complaints and ROS questioning is negative.    Past Medical History  Diagnosis Date  . Allergic rhinitis   . Sinusitis   . Hypertension   . Ovarian cancer ([place])   . Pulmonary embolism ([place])   . Anemia associated with chemotherapy 09/16/2011  . Bone spur of other site     left foot  . Ovarian cancer on right ([place]) 06/17/2011  . Peripheral neuropathy ([place]) 08/26/2011    Chemotherapy-induced  . Abdominal hernia   . Dry eyes     has Ovarian cancer on right [facility]); Hypertension; H/O hysterectomy with oophorectomy; Peripheral neuropathy ([place]); Ventral hernia; and Incisional hernia, incarcerated on her problem list.     is allergic to codeine and hydrocodone.  Sarah Walker had no medications administered during this visit.  Past Surgical History  Procedure Laterality Date  . Tonsillectomy      age 24's  . Cholecystectomy  1990  . Abdominal hysterectomy  05/25/2011  [place] bs&o and cancer staging  . Tubal ligation  1981  . Portacath placement  06/16/11    Done at [facility]  . Ivc filter  05/24/11  . Port-a-cath removal Right 11/21/2013    Procedure: MINOR REMOVAL PORT-A-CATH;  Surgeon: Jamesetta So, MD;  Location: [facility] ORS;  Service: General;  Laterality: Right;  . Colonoscopy N/A 09/06/2014    Procedure: COLONOSCOPY;  Surgeon: Rogene Houston, MD;  Location: [facility] ENDO  SUITE;  Service: Endoscopy;  Laterality: N/A;  930 - moved to 9/2 @ 8:25  . Incisional hernia repair  10/15/2014  . Incisional hernia repair  10/15/2014    Procedure: OPEN INCISIONAL HERNIA REPAIR WITH MESH AND MYOFASCIAL FLAPS;  Surgeon: Erroll Luna, MD;  Location: [facility];  Service: General;;    Denies any headaches, dizziness, double vision, fevers, chills, night sweats, nausea, vomiting, diarrhea, constipation, chest pain, heart palpitations, shortness of breath, blood in stool, black tarry stool, urinary pain, urinary burning, urinary frequency, hematuria. Positive for neuropathy.    Numbness in the tips of her fingers and balls of her feet.   PHYSICAL EXAMINATION  ECOG PERFORMANCE STATUS: 0 - Asymptomatic  Filed Vitals:   03/31/15 1200  BP: 133/70  Pulse: 56  Temp: 98 F (36.7 C)  Resp: 18    GENERAL:alert, no distress, well nourished, well developed, comfortable, cooperative, obese and smiling SKIN: skin color, texture, turgor are normal, no rashes or significant lesions HEAD: Normocephalic, No masses, lesions, tenderness or abnormalities EYES: normal, PERRLA, EOMI, Conjunctiva are pink and non-injected EARS: External ears normal OROPHARYNX:lips, buccal mucosa, and tongue normal and mucous membranes are moist  NECK: supple, no adenopathy, thyroid normal size, non-tender, without nodularity, no stridor, non-tender, trachea midline LYMPH:  no palpable lymphadenopathy, no hepatosplenomegaly BREAST:not examined LUNGS: clear to auscultation and percussion HEART: regular rate & rhythm, no murmurs, no gallops, S1 normal and S2 normal ABDOMEN:abdomen soft, non-tender, obese, normal bowel sounds, no masses or organomegaly and no hepatosplenomegaly BACK: Back symmetric, no curvature., No CVA tenderness EXTREMITIES:less then 2 second capillary refill, no joint deformities, effusion, or inflammation, no edema, no skin discoloration, no clubbing, no cyanosis  NEURO: alert & oriented x  3 with fluent speech, no focal motor/sensory deficits, gait normal   LABORATORY DATA: I have reviewed the labs below. CBC    Component Value Date/Time   WBC 5.0 03/31/2015 1220   RBC 4.10 03/31/2015 1220   HGB 13.3 03/31/2015 1220   HCT 40.3 03/31/2015 1220   PLT 167 03/31/2015 1220   MCV 98.3 03/31/2015 1220   MCH 32.4 03/31/2015 1220   MCHC 33.0 03/31/2015 1220   RDW 13.8 03/31/2015 1220   LYMPHSABS 1.9 03/31/2015 1220   MONOABS 0.3 03/31/2015 1220   EOSABS 0.2 03/31/2015 1220   BASOSABS 0.0 03/31/2015 1220      Chemistry      Component Value Date/Time   NA 141 03/31/2015 1220   K 4.9 03/31/2015 1220   CL 109 03/31/2015 1220   CO2 26 03/31/2015 1220   BUN 23* 03/31/2015 1220   CREATININE 0.84 03/31/2015 1220      Component Value Date/Time   CALCIUM 8.9 03/31/2015 1220   ALKPHOS 68 03/31/2015 1220   AST 19 03/31/2015 1220   ALT 18 03/31/2015 1220   BILITOT 0.5 03/31/2015 1220      CA-125: 20 (09/12/2013)  ASSESSMENT AND PLAN:  Stage I ovarian cancer Treatment related neuropathy, Taxol induced (Grade I) History of PE   She is  overall doing well. She has no evidence of recurrent disease. We will call her with the results of her CA 125. We discussed that she needs a mammogram and breast exam yearly. Dr. Hilma Favors orders her mammograms. She continues to follow regularly with her local OB GYN.  I will see her again in 6 months for a routine follow up with labs. We discussed genetic testing and she thinks she may have had it. I cannot find anywhere in EPIC that she did. Dr. Jaclyn Prime note mentions testing for BRCA1 and 2 but it noted that the patient would call to establish testing.   She is overall doing well. She continues to experience chemotherapy-induced neuropathy. She underwent hernia surgery since our last visit and has done well without issue.    THERAPY PLAN:  NCCN guidelines for surveillance of Stage I-IV Epithelial Ovarian Cancer/ Fallopian Tube  Cancer/ Primary Peritoneal Cancer recommends the following:  A. Vists every 2-4 months for first 2 years, then 3-6 months for years 3-5, and then annually after 5 years.  B. Physical exam including pelvic exam  C. CA-125 or other tumor markers every visit if initially elevated.  D. Refer for genetic risk evaluation if not previously performed.   E. CBC and chemistry profile as indicated.   F. CT Chest/Abdomen/Pelvis, MRI, PET-CT, or PET (Category 2B for PET) as clinically indicated.  G. Chest X-ray as indicted.   Orders Placed This Encounter  Procedures  . CBC with Differential    Standing Status: Future     Number of Occurrences:      Standing Expiration Date: 03/30/2017  . Comprehensive metabolic panel    Standing Status: Future     Number of Occurrences:      Standing Expiration Date: 03/30/2017  . CA 125    Standing Status: Future     Number of Occurrences:      Standing Expiration Date: 03/30/2017    All questions were answered. The patient knows to call the clinic with any problems, questions or concerns. We can certainly see the patient much sooner if necessary.  This document serves as a record of services personally performed by Ancil Linsey, MD. It was created on her behalf by Arlyce Harman, a trained medical scribe. The creation of this record is based on the scribe's personal observations and the provider's statements to them. This document has been checked and approved by the attending provider.  I have reviewed the above documentation for accuracy and completeness, and I agree with the above.  This note is electronically signed by: Kelby Fam. Delray Reza, MD  03/31/2015 1:18 PM

## 2015-04-01 LAB — CA 125: CA 125: 16.7 U/mL (ref 0.0–38.1)

## 2015-04-09 ENCOUNTER — Encounter (HOSPITAL_COMMUNITY): Payer: Self-pay

## 2015-05-08 ENCOUNTER — Encounter: Payer: Self-pay | Admitting: Genetic Counselor

## 2015-05-13 ENCOUNTER — Encounter: Payer: Self-pay | Admitting: Genetic Counselor

## 2015-05-14 DIAGNOSIS — T148 Other injury of unspecified body region: Secondary | ICD-10-CM | POA: Diagnosis not present

## 2015-05-15 ENCOUNTER — Encounter (HOSPITAL_COMMUNITY): Payer: Medicare Other

## 2015-05-15 ENCOUNTER — Encounter (HOSPITAL_COMMUNITY): Payer: Self-pay | Admitting: Genetic Counselor

## 2015-05-15 ENCOUNTER — Encounter (HOSPITAL_COMMUNITY): Payer: Medicare Other | Attending: Hematology & Oncology | Admitting: Genetic Counselor

## 2015-05-15 DIAGNOSIS — Z9221 Personal history of antineoplastic chemotherapy: Secondary | ICD-10-CM | POA: Insufficient documentation

## 2015-05-15 DIAGNOSIS — Z86711 Personal history of pulmonary embolism: Secondary | ICD-10-CM | POA: Insufficient documentation

## 2015-05-15 DIAGNOSIS — G620 Drug-induced polyneuropathy: Secondary | ICD-10-CM | POA: Insufficient documentation

## 2015-05-15 DIAGNOSIS — Z8051 Family history of malignant neoplasm of kidney: Secondary | ICD-10-CM | POA: Insufficient documentation

## 2015-05-15 DIAGNOSIS — Z90710 Acquired absence of both cervix and uterus: Secondary | ICD-10-CM | POA: Insufficient documentation

## 2015-05-15 DIAGNOSIS — C561 Malignant neoplasm of right ovary: Secondary | ICD-10-CM | POA: Diagnosis not present

## 2015-05-15 DIAGNOSIS — Z8042 Family history of malignant neoplasm of prostate: Secondary | ICD-10-CM | POA: Diagnosis not present

## 2015-05-15 DIAGNOSIS — Z315 Encounter for genetic counseling: Secondary | ICD-10-CM | POA: Diagnosis not present

## 2015-05-15 DIAGNOSIS — Z9049 Acquired absence of other specified parts of digestive tract: Secondary | ICD-10-CM | POA: Insufficient documentation

## 2015-05-15 DIAGNOSIS — Z8543 Personal history of malignant neoplasm of ovary: Secondary | ICD-10-CM | POA: Insufficient documentation

## 2015-05-15 DIAGNOSIS — Z9889 Other specified postprocedural states: Secondary | ICD-10-CM | POA: Insufficient documentation

## 2015-05-15 DIAGNOSIS — I10 Essential (primary) hypertension: Secondary | ICD-10-CM | POA: Insufficient documentation

## 2015-05-15 DIAGNOSIS — C569 Malignant neoplasm of unspecified ovary: Secondary | ICD-10-CM | POA: Insufficient documentation

## 2015-05-15 NOTE — Progress Notes (Signed)
REFERRING PROVIDER: [name], MD [place], [place] [address]   Ancil Linsey, MD  PRIMARY PROVIDER:  Purvis Kilts, MD  PRIMARY REASON FOR VISIT:  1. Ovarian cancer on right ([place])   2. Family history of kidney cancer   3. Family history of prostate cancer      HISTORY OF PRESENT ILLNESS:   Sarah Walker, a 65 y.o. female, was seen for a [place] cancer genetics consultation at the request of Dr. Whitney Muse due to a personal and family history of cancer.  Sarah Walker presents to clinic today to discuss the possibility of a hereditary predisposition to cancer, genetic testing, and to further clarify her future cancer risks, as well as potential cancer risks for family members.   In 2013, at the age of 43, Sarah Walker was diagnosed with ovarian cancer of the right ovary. This was treated with TAH-BSO and chemotherapy.  Sarah Walker reports that she used [employer]'s Baby Powder for 75 years and has completed forms for the class action law suit.  She also reports that she thought she had genetic testing in 2013, and that it came back negative, however, we do not have Sarah Walker in our system as having genetic testing, nor does [facility] have her in their system.  Regardless, we discussed that there have been updates to genetic testing since 2013 and therefore we would recommend repeating that testing.    CANCER HISTORY:    Ovarian cancer on right [facility])   05/25/2011 Surgery Debulking surgery at [facility] in [facility]   05/25/2011 Initial Diagnosis Ovarian cancer   07/15/2011 - 11/04/2011 Chemotherapy Carboplatin/Pacliatexel x 6 cycles   12/01/2011 Remission CT scan negative for disease   05/15/2012 Imaging CT abd/pelvis- Negative for mass or adenopathy.     HORMONAL RISK FACTORS:  Menarche was at age 84.  First live birth at age 7.  OCP use for approximately 0 years.  Ovaries intact: no.  Hysterectomy: yes.  Menopausal status: postmenopausal.  HRT use:  3-4 years. Colonoscopy: yes; 1 polyp. Mammogram within the last year: yes. Number of breast biopsies: 0. Up to date with pelvic exams:  yes. Any excessive radiation exposure in the past:  no  Past Medical History  Diagnosis Date  . Allergic rhinitis   . Sinusitis   . Hypertension   . Ovarian cancer ([place])   . Pulmonary embolism ([place])   . Anemia associated with chemotherapy 09/16/2011  . Bone spur of other site     left foot  . Ovarian cancer on right ([place]) 06/17/2011  . Peripheral neuropathy ([place]) 08/26/2011    Chemotherapy-induced  . Abdominal hernia   . Dry eyes   . Family history of kidney cancer   . Family history of prostate cancer     Past Surgical History  Procedure Laterality Date  . Tonsillectomy      age 9's  . Cholecystectomy  1990  . Abdominal hysterectomy  05/25/2011    [place] bs&o and cancer staging  . Tubal ligation  1981  . Portacath placement  06/16/11    Done at [facility]  . Ivc filter  05/24/11  . Port-a-cath removal Right 11/21/2013    Procedure: MINOR REMOVAL PORT-A-CATH;  Surgeon: Jamesetta So, MD;  Location: [facility] ORS;  Service: General;  Laterality: Right;  . Colonoscopy N/A 09/06/2014    Procedure: COLONOSCOPY;  Surgeon: Rogene Houston, MD;  Location[facility] ENDO SUITE;  Service: Endoscopy;  Laterality: N/A;  930 - moved  to 9/2 @ 8:25  . Incisional hernia repair  10/15/2014  . Incisional hernia repair  10/15/2014    Procedure: OPEN INCISIONAL HERNIA REPAIR WITH MESH AND MYOFASCIAL FLAPS;  Surgeon: Erroll Luna, MD;  Location: [facility];  Service: General;;    Social History   Social History  . Marital Status: Married    Spouse Name: N/A  . Number of Children: N/A  . Years of Education: N/A   Social History Main Topics  . Smoking status: Never Smoker   . Smokeless tobacco: Never Used  . Alcohol Use: No  . Drug Use: No  . Sexual Activity: Yes    Birth Control/ Protection: Surgical     Comment: hyst   Other Topics Concern  . None   Social  History Narrative     FAMILY HISTORY:  We obtained a detailed, 4-generation family history.  Significant diagnoses are listed below: Family History  Problem Relation Age of Onset  . Stroke Mother   . Cervical cancer Mother 64  . Kidney cancer Father 61  . Aneurysm Paternal Grandmother     61s  . Hodgkin's lymphoma Maternal Uncle 33  . Prostate cancer Paternal Uncle   . Cancer Cousin     2 female maternal first cousins with unknown cancer  . Throat cancer Paternal Uncle     The pateint has two children who are cancer free.  She has one brother who is healthy at age 76.  Both parents are deceased.  Her mother died at 29 and had been diagnosed with cervical cancer in her 27s which required a TAH-BSO.  Her mother had 11 siblings, three were full and the remainder were half siblings.  A full brother died at 23 with hodgkin's lymphoma, but there is no other reported cancer history on the maternal side.  The patient's father died of kidney cancer at 42.  He had four maternal half brothers.  One brother had prostate cancer and another had throat cancer and was a smoker.  There is no other reported history of cancer.  Patient's maternal ancestors are of Caucasian descent, and paternal ancestors are of Caucasian and possibly Native American descent. There is no reported Ashkenazi Jewish ancestry. There is no known consanguinity.  GENETIC COUNSELING ASSESSMENT: Sarah Walker is a 65 y.o. female with a personal history of ovarian cancer and family history of kidney and prostate cancer which is somewhat suggestive of a hereditary cancer syndrome and predisposition to cancer. We, therefore, discussed and recommended the following at today's visit.   DISCUSSION: We discussed that about 15-20% of ovarian cancer is due to hereditary cancer syndromes.  Most commonly we see mutations in BRCA, but sometimes we can see it in the MMR Lynch syndrome genes, or other hereditary ovarian cancer genes.  We reviewed the  characteristics, features and inheritance patterns of hereditary cancer syndromes. We also discussed genetic testing, including the appropriate family members to test, the process of testing, insurance coverage and turn-around-time for results. We discussed the implications of a negative, positive and/or variant of uncertain significant result. We recommended Sarah Walker pursue genetic testing for the Breast/Ovarian cancer gene panel. The Breast/Ovarian gene panel offered by [facility] includes sequencing and rearrangement analysis for the following 20 genes:  ATM, BARD1, BRCA1, BRCA2, BRIP1, CDH1, CHEK2, EPCAM, FANCC, MLH1, MSH2, MSH6, NBN, PALB2, PMS2, PTEN, RAD51C, RAD51D, TP53, and XRCC2.     Based on Sarah Walker's personal and family history of cancer, she meets medical criteria for genetic  testing. Despite that she meets criteria, she may still have an out of pocket cost. We discussed that if her out of pocket cost for testing is over $100, the laboratory will call and confirm whether she wants to proceed with testing.  If the out of pocket cost of testing is less than $100 she will be billed by the genetic testing laboratory.   PLAN: After considering the risks, benefits, and limitations, Sarah Walker  provided informed consent to pursue genetic testing and the blood sample was sent to [facility] for analysis of the Breast/Ovarian cancer panel. Results should be available within approximately 2-3 weeks' time, at which point they will be disclosed by telephone to Sarah Walker, as will any additional recommendations warranted by these results. Sarah Walker will receive a summary of her genetic counseling visit and a copy of her results once available. This information will also be available in Epic. We encouraged Sarah Walker to remain in contact with cancer genetics annually so that we can continuously update the family history and inform her of any changes in cancer genetics and testing that may be of benefit  for her family. Sarah Walker questions were answered to her satisfaction today. Our contact information was provided should additional questions or concerns arise.  Lastly, we encouraged Sarah Walker to remain in contact with cancer genetics annually so that we can continuously update the family history and inform her of any changes in cancer genetics and testing that may be of benefit for this family.   Ms.  Walker questions were answered to her satisfaction today. Our contact information was provided should additional questions or concerns arise. Thank you for the referral and allowing [place] to share in the care of your patient.   Arianna Haydon P. Florene Glen, [place], [facility] Certified Genetic Counselor [email or web address] phone: [phone]  The patient was seen for a total of 60 minutes in face-to-face genetic counseling.  This patient was discussed with Drs. Magrinat, Lindi Adie and/or Burr Medico who agrees with the above.    _______________________________________________________________________ For Office Staff:  Number of people involved in session: 1 Was an Intern/ student involved with case: no

## 2015-05-16 DIAGNOSIS — T148 Other injury of unspecified body region: Secondary | ICD-10-CM | POA: Diagnosis not present

## 2015-05-21 DIAGNOSIS — Z8042 Family history of malignant neoplasm of prostate: Secondary | ICD-10-CM | POA: Diagnosis not present

## 2015-05-21 DIAGNOSIS — Z8051 Family history of malignant neoplasm of kidney: Secondary | ICD-10-CM | POA: Diagnosis not present

## 2015-05-21 DIAGNOSIS — C561 Malignant neoplasm of right ovary: Secondary | ICD-10-CM | POA: Diagnosis not present

## 2015-05-23 DIAGNOSIS — T148 Other injury of unspecified body region: Secondary | ICD-10-CM | POA: Diagnosis not present

## 2015-05-23 DIAGNOSIS — Z9889 Other specified postprocedural states: Secondary | ICD-10-CM | POA: Diagnosis not present

## 2015-05-29 ENCOUNTER — Ambulatory Visit: Payer: Self-pay | Admitting: Genetic Counselor

## 2015-05-29 ENCOUNTER — Encounter: Payer: Self-pay | Admitting: Genetic Counselor

## 2015-05-29 ENCOUNTER — Telehealth: Payer: Self-pay | Admitting: Genetic Counselor

## 2015-05-29 DIAGNOSIS — Z1379 Encounter for other screening for genetic and chromosomal anomalies: Secondary | ICD-10-CM | POA: Insufficient documentation

## 2015-05-29 NOTE — Telephone Encounter (Signed)
LM on VM with good news.  ASked that patient call back.  Left CB instructions.

## 2015-06-03 ENCOUNTER — Ambulatory Visit: Payer: Self-pay | Admitting: Genetic Counselor

## 2015-06-03 ENCOUNTER — Telehealth: Payer: Self-pay | Admitting: Genetic Counselor

## 2015-06-03 DIAGNOSIS — Z8051 Family history of malignant neoplasm of kidney: Secondary | ICD-10-CM

## 2015-06-03 DIAGNOSIS — Z8042 Family history of malignant neoplasm of prostate: Secondary | ICD-10-CM

## 2015-06-03 DIAGNOSIS — Z1379 Encounter for other screening for genetic and chromosomal anomalies: Secondary | ICD-10-CM

## 2015-06-03 DIAGNOSIS — C561 Malignant neoplasm of right ovary: Secondary | ICD-10-CM

## 2015-06-03 NOTE — Progress Notes (Signed)
HPI: Ms. Grieshop was previously seen in the [place] clinic due to a personal history of cancer and concerns regarding a hereditary predisposition to cancer. Please refer to our prior cancer genetics clinic note for more information regarding Ms. Krach's medical, social and family histories, and our assessment and recommendations, at the time. Ms. Totten recent genetic test results were disclosed to her, as were recommendations warranted by these results. These results and recommendations are discussed in more detail below.  FAMILY HISTORY:  We obtained a detailed, 4-generation family history.  Significant diagnoses are listed below: Family History  Problem Relation Age of Onset  . Stroke Mother   . Cervical cancer Mother 12  . Kidney cancer Father 73  . Aneurysm Paternal Grandmother     43s  . Hodgkin's lymphoma Maternal Uncle 33  . Prostate cancer Paternal Uncle   . Cancer Cousin     2 female maternal first cousins with unknown cancer  . Throat cancer Paternal Uncle     The pateint has two children who are cancer free. She has one brother who is healthy at age 36. Both parents are deceased. Her mother died at 27 and had been diagnosed with cervical cancer in her 90s which required a TAH-BSO. Her mother had 11 siblings, three were full and the remainder were half siblings. A full brother died at 59 with hodgkin's lymphoma, but there is no other reported cancer history on the maternal side. The patient's father died of kidney cancer at 37. He had four maternal half brothers. One brother had prostate cancer and another had throat cancer and was a smoker. There is no other reported history of cancer. Patient's maternal ancestors are of Caucasian descent, and paternal ancestors are of Caucasian and possibly Native American descent. There is no reported Ashkenazi Jewish ancestry. There is no known consanguinity.  GENETIC TEST RESULTS: At the time of Ms. Ballentine's visit,  we recommended she pursue genetic testing of the Breast/Ovarian cancer gene panel. The Breast/Ovarian gene panel offered by [facility] includes sequencing and rearrangement analysis for the following 20 genes:  ATM, BARD1, BRCA1, BRCA2, BRIP1, CDH1, CHEK2, EPCAM, FANCC, MLH1, MSH2, MSH6, NBN, PALB2, PMS2, PTEN, RAD51C, RAD51D, TP53, and XRCC2.   The report date is May 28, 2015.  Genetic testing was normal, and did not reveal a deleterious mutation in these genes. The test report has been scanned into EPIC and is located under the Molecular Pathology section of the Results Review tab.   We discussed with Ms. Agredano that since the current genetic testing is not perfect, it is possible there may be a gene mutation in one of these genes that current testing cannot detect, but that chance is small. We also discussed, that it is possible that another gene that has not yet been discovered, or that we have not yet tested, is responsible for the cancer diagnoses in the family, and it is, therefore, important to remain in touch with cancer genetics in the future so that we can continue to offer Ms. Chalupa the most up to date genetic testing.   We also discussed that while this testing rules out a genetic cause for her ovarian cancer in the genes that we tested, we are not able to determine what caused her ovarian cancer.  She asked about using Johnson's baby powder. This test does not prove or disprove that use of baby powder caused her ovarian cancer.    CANCER SCREENING RECOMMENDATIONS: This result is reassuring and  indicates that Ms. Cappelletti likely does not have an increased risk for a future cancer due to a mutation in one of these genes. This normal test also suggests that Ms. Rainwater's cancer was most likely not due to an inherited predisposition associated with one of these genes.  Most cancers happen by chance and this negative test suggests that her cancer falls into this category.  We, therefore, recommended she  continue to follow the cancer management and screening guidelines provided by her oncology and primary healthcare provider.   RECOMMENDATIONS FOR FAMILY MEMBERS: Women in this family might be at some increased risk of developing cancer, over the general population risk, simply due to the family history of cancer. We recommended women in this family have a yearly mammogram beginning at age 43, or 8 years younger than the earliest onset of cancer, an an annual clinical breast exam, and perform monthly breast self-exams. Women in this family should also have a gynecological exam as recommended by their primary provider. All family members should have a colonoscopy by age 91.  FOLLOW-UP: Lastly, we discussed with Ms. Styles that cancer genetics is a rapidly advancing field and it is possible that new genetic tests will be appropriate for her and/or her family members in the future. We encouraged her to remain in contact with cancer genetics on an annual basis so we can update her personal and family histories and let her know of advances in cancer genetics that may benefit this family.   Our contact number was provided. Ms. Mulnix questions were answered to her satisfaction, and she knows she is welcome to call us at anytime with additional questions or concerns.   Roma Kayser, MS, [facility] Certified Genetic Counselor [email or web address]

## 2015-06-03 NOTE — Telephone Encounter (Signed)
Discussed that her genetic testing was negative.  This rules out a genetic cause by mutations within one of these genes.  This does not prove or disprove that her cancer is due to baby powder use.  Based on this test we would not offer genetic testing on her children or other relatives.

## 2015-06-04 DIAGNOSIS — Z9889 Other specified postprocedural states: Secondary | ICD-10-CM | POA: Diagnosis not present

## 2015-06-05 NOTE — Progress Notes (Signed)
Opened in error

## 2015-07-11 DIAGNOSIS — M1712 Unilateral primary osteoarthritis, left knee: Secondary | ICD-10-CM | POA: Diagnosis not present

## 2015-08-20 ENCOUNTER — Other Ambulatory Visit (HOSPITAL_COMMUNITY): Payer: Self-pay | Admitting: Hematology & Oncology

## 2015-08-20 DIAGNOSIS — Z1231 Encounter for screening mammogram for malignant neoplasm of breast: Secondary | ICD-10-CM

## 2015-09-09 ENCOUNTER — Other Ambulatory Visit (HOSPITAL_COMMUNITY): Payer: Self-pay | Admitting: Hematology & Oncology

## 2015-09-10 ENCOUNTER — Ambulatory Visit (HOSPITAL_COMMUNITY)
Admission: RE | Admit: 2015-09-10 | Discharge: 2015-09-10 | Disposition: A | Discharge status: Home / Self Care | Payer: Medicare Other | Source: Ambulatory Visit | Attending: Hematology & Oncology | Admitting: Hematology & Oncology

## 2015-09-10 DIAGNOSIS — Z1231 Encounter for screening mammogram for malignant neoplasm of breast: Secondary | ICD-10-CM | POA: Diagnosis not present

## 2015-09-19 DIAGNOSIS — I10 Essential (primary) hypertension: Secondary | ICD-10-CM | POA: Diagnosis not present

## 2015-09-19 DIAGNOSIS — Z1389 Encounter for screening for other disorder: Secondary | ICD-10-CM | POA: Diagnosis not present

## 2015-09-27 ENCOUNTER — Other Ambulatory Visit: Payer: Self-pay | Admitting: Family Medicine

## 2015-09-27 DIAGNOSIS — R5381 Other malaise: Secondary | ICD-10-CM

## 2015-10-03 ENCOUNTER — Ambulatory Visit (HOSPITAL_COMMUNITY): Admitting: Hematology & Oncology

## 2015-10-03 ENCOUNTER — Other Ambulatory Visit (HOSPITAL_COMMUNITY)

## 2015-10-16 ENCOUNTER — Encounter (HOSPITAL_COMMUNITY): Payer: Medicare Other

## 2015-10-16 ENCOUNTER — Encounter (HOSPITAL_COMMUNITY): Payer: Self-pay

## 2015-10-16 ENCOUNTER — Encounter (HOSPITAL_COMMUNITY): Payer: Medicare Other | Attending: Oncology | Admitting: Oncology

## 2015-10-16 VITALS — BP 122/67 | HR 56 | Temp 98.3°F | Resp 18 | Ht 62.0 in | Wt 222.0 lb

## 2015-10-16 DIAGNOSIS — Z86711 Personal history of pulmonary embolism: Secondary | ICD-10-CM | POA: Insufficient documentation

## 2015-10-16 DIAGNOSIS — C561 Malignant neoplasm of right ovary: Secondary | ICD-10-CM | POA: Diagnosis present

## 2015-10-16 DIAGNOSIS — G629 Polyneuropathy, unspecified: Secondary | ICD-10-CM | POA: Diagnosis not present

## 2015-10-16 DIAGNOSIS — I10 Essential (primary) hypertension: Secondary | ICD-10-CM | POA: Insufficient documentation

## 2015-10-16 LAB — CBC WITH DIFFERENTIAL/PLATELET
Basophils Absolute: 0 10*3/uL (ref 0.0–0.1)
Basophils Relative: 0 %
Eosinophils Absolute: 0.2 10*3/uL (ref 0.0–0.7)
Eosinophils Relative: 5 %
HCT: 40.6 % (ref 36.0–46.0)
HEMOGLOBIN: 13.4 g/dL (ref 12.0–15.0)
LYMPHS ABS: 1.9 10*3/uL (ref 0.7–4.0)
LYMPHS PCT: 44 %
MCH: 31.9 pg (ref 26.0–34.0)
MCHC: 33 g/dL (ref 30.0–36.0)
MCV: 96.7 fL (ref 78.0–100.0)
Monocytes Absolute: 0.3 10*3/uL (ref 0.1–1.0)
Monocytes Relative: 7 %
NEUTROS PCT: 44 %
Neutro Abs: 1.8 10*3/uL (ref 1.7–7.7)
Platelets: 159 10*3/uL (ref 150–400)
RBC: 4.2 MIL/uL (ref 3.87–5.11)
RDW: 13.8 % (ref 11.5–15.5)
WBC: 4.2 10*3/uL (ref 4.0–10.5)

## 2015-10-16 LAB — COMPREHENSIVE METABOLIC PANEL
ALK PHOS: 60 U/L (ref 38–126)
ALT: 24 U/L (ref 14–54)
AST: 21 U/L (ref 15–41)
Albumin: 4 g/dL (ref 3.5–5.0)
Anion gap: 6 (ref 5–15)
BILIRUBIN TOTAL: 0.6 mg/dL (ref 0.3–1.2)
BUN: 22 mg/dL — ABNORMAL HIGH (ref 6–20)
CALCIUM: 9.3 mg/dL (ref 8.9–10.3)
CO2: 25 mmol/L (ref 22–32)
CREATININE: 0.74 mg/dL (ref 0.44–1.00)
Chloride: 108 mmol/L (ref 101–111)
Glucose, Bld: 97 mg/dL (ref 65–99)
Potassium: 4 mmol/L (ref 3.5–5.1)
Sodium: 139 mmol/L (ref 135–145)
Total Protein: 6.9 g/dL (ref 6.5–8.1)

## 2015-10-16 MED ORDER — HYDROCODONE-ACETAMINOPHEN 5-325 MG PO TABS: 1.0000 | ORAL_TABLET | ORAL | 0 refills | Status: DC | PRN

## 2015-10-16 NOTE — Progress Notes (Signed)
Sarah Walker, [place] [id number]  No diagnosis found.  CURRENT THERAPY: Surveillance per NCCN guidelines  INTERVAL HISTORY: Sarah Walker 65 y.o. female returns for followup of stage IA papillary serous cystadenocarcinoma status post surgical resection followed by adjuvant chemotherapy consisting of 6 cycles of carboplatin and paclitaxel from 07/15/2011 through 11/04/2011. At the time of presentation her cancer marker was elevated and remained elevated after surgery. It normalized with chemotherapy.   This lady's presentation in May of 0000000 was complicated by pulmonary emboli. An IVC filter was placed initially prior surgery and was removed in December 2013.  Anticoagulation was discontinued following one year's worth of anticoagulation. . Patient is here for ongoing evaluation regarding ovarian cancer stage I disease status post 6 cycles of chemotherapy  Since last visit patient has undergone genetic testing and has been reported to be within normal limit.  No abnormality detected. Patient continues to have problems with neuropathy but according to her neuropathy in upper extremity is getting better She takes half a tablet of hydrocodone when she has to walk long distances Patient is due for pelvic examination Had a mammogram and reported to be negative      Ovarian cancer on right [facility])   05/25/2011 Surgery    Debulking surgery at [facility] in [facility]      05/25/2011 Initial Diagnosis    Ovarian cancer      07/15/2011 - 11/04/2011 Chemotherapy    Carboplatin/Pacliatexel x 6 cycles      12/01/2011 Remission    CT scan negative for disease      05/15/2012 Imaging    CT abd/pelvis- Negative for mass or adenopathy.       I personally reviewed and went over laboratory results with the patient.  The results are noted within this dictation.  Oncologically, she denies any complaints and ROS questioning is negative.     Past Medical History:  Diagnosis Date  . Abdominal hernia   . Allergic rhinitis   . Anemia associated with chemotherapy 09/16/2011  . Bone spur of other site    left foot  . Dry eyes   . Family history of kidney cancer   . Family history of prostate cancer   . Hypertension   . Ovarian cancer ([place])   . Ovarian cancer on right ([place]) 06/17/2011  . Peripheral neuropathy ([place]) 08/26/2011   Chemotherapy-induced  . Pulmonary embolism ([place])   . Sinusitis     has Ovarian cancer on right [facility]); Hypertension; H/O hysterectomy with oophorectomy; Ventral hernia; Incisional hernia, incarcerated; Chemotherapy-induced neuropathy ([place]); Family history of kidney cancer; Family history of prostate cancer; and Genetic testing on her problem list.     is allergic to codeine and hydrocodone.  Sarah Walker had no medications administered during this visit.  Past Surgical History:  Procedure Laterality Date  . ABDOMINAL HYSTERECTOMY  05/25/2011   [place] bs&o and cancer staging  . CHOLECYSTECTOMY  1990  . COLONOSCOPY N/A 09/06/2014   Procedure: COLONOSCOPY;  Surgeon: Rogene Houston, MD;  [facility];  Service: Endoscopy;  Laterality: N/A;  930 - moved to 9/2 @ 8:25  . INCISIONAL HERNIA REPAIR  10/15/2014  . INCISIONAL HERNIA REPAIR  10/15/2014   Procedure: OPEN INCISIONAL HERNIA REPAIR WITH MESH AND MYOFASCIAL FLAPS;  Surgeon: Erroll Luna, MD;  Location: [place];  Service: General;;  . IVC filter  05/24/11  . PORT-A-CATH REMOVAL Right 11/21/2013   Procedure: MINOR  REMOVAL PORT-A-CATH;  Surgeon: Jamesetta So, MD;  Location: [facility];  Service: General;  Laterality: Right;  . PORTACATH PLACEMENT  06/16/11   Done at [facility]  . TONSILLECTOMY     age 69's  . TUBAL LIGATION  1981    Denies any headaches, dizziness, double vision, fevers, chills, night sweats, nausea, vomiting, diarrhea, constipation, chest pain, heart palpitations, shortness of breath, blood in stool, black tarry stool,  urinary pain, urinary burning, urinary frequency, hematuria. Positive for neuropathy.    Numbness in the tips of her fingers and balls of her feet.   PHYSICAL EXAMINATION  ECOG PERFORMANCE STATUS: 0 - Asymptomatic  There were no vitals filed for this visit.  GENERAL:alert, no distress, well nourished, well developed, comfortable, cooperative, obese and smiling SKIN: skin color, texture, turgor are normal, no rashes or significant lesions HEAD: Normocephalic, No masses, lesions, tenderness or abnormalities EYES: normal, PERRLA, EOMI, Conjunctiva are pink and non-injected EARS: External ears normal OROPHARYNX:lips, buccal mucosa, and tongue normal and mucous membranes are moist  NECK: supple, no adenopathy, thyroid normal size, non-tender, without nodularity, no stridor, non-tender, trachea midline LYMPH:  no palpable lymphadenopathy, no hepatosplenomegaly BREAST:not examined LUNGS: clear to auscultation and percussion HEART: regular rate & rhythm, no murmurs, no gallops, S1 normal and S2 normal ABDOMEN:abdomen soft, non-tender, obese, normal bowel sounds, no masses or organomegaly and no hepatosplenomegaly BACK: Back symmetric, no curvature., No CVA tenderness EXTREMITIES:less then 2 second capillary refill, no joint deformities, effusion, or inflammation, no edema, no skin discoloration, no clubbing, no cyanosis  NEURO: alert & oriented x 3 with fluent speech, no focal motor/sensory deficits, gait normal Sensory neuropathy grade 2   LABORATORY DATA: I have reviewed the labs below. CBC    Component Value Date/Time   WBC 4.2 10/16/2015 0834   RBC 4.20 10/16/2015 0834   HGB 13.4 10/16/2015 0834   HCT 40.6 10/16/2015 0834   PLT 159 10/16/2015 0834   MCV 96.7 10/16/2015 0834   MCH 31.9 10/16/2015 0834   MCHC 33.0 10/16/2015 0834   RDW 13.8 10/16/2015 0834   LYMPHSABS 1.9 10/16/2015 0834   MONOABS 0.3 10/16/2015 0834   EOSABS 0.2 10/16/2015 0834   BASOSABS 0.0 10/16/2015 0834              CA-125: 20 (09/12/2013)  ASSESSMENT AND PLAN:  Stage I ovarian cancer Treatment related neuropathy, Taxol induced (Grade I) History of PE   Genetic testing within normal limit Recent mammogram has been negative Pelvic examination is pending C of 125 in March of 2017 was 16.7 All of the lab data has been reviewed within normal limit   THERAPY PLAN:  NCCN guidelines for surveillance of Stage I-IV Epithelial Ovarian Cancer/ Fallopian Tube Cancer/ Primary Peritoneal Cancer recommends the following:  A. Vists every 2-4 months for first 2 years, then 3-6 months for years 3-5, and then annually after 5 years.  B. Physical exam including pelvic exam  C. CA-125 or other tumor markers every visit if initially elevated.  D. Refer for genetic risk evaluation if not previously performed.   E. CBC and chemistry profile as indicated.   F. CT Chest/Abdomen/Pelvis, MRI, PET-CT, or PET (Category 2B for PET) as clinically indicated.  G. Chest X-ray as indicted.   No orders of the defined types were placed in this encounter.   All questions were answered. The patient knows to call the clinic with any problems, questions or concerns. We can certainly see the patient much sooner if  necessary.  This document serves as a record of services personally performed by Ancil Linsey, MD. It was created on her behalf by Arlyce Harman, a trained medical scribe. The creation of this record is based on the scribe's personal observations and the provider's statements to them. This document has been checked and approved by the attending provider.  I have reviewed the above documentation for accuracy and completeness, and I agree with the above.  This note is electronically signed by: Kelby Fam. Penland, MD  10/16/2015 9:09 AM

## 2015-10-16 NOTE — Patient Instructions (Signed)
[facility] at [facility] Discharge Instructions  RECOMMENDATIONS MADE BY THE CONSULTANT AND ANY TEST RESULTS WILL BE SENT TO YOUR REFERRING PHYSICIAN.  You were seen today by Dr. Choksi. Follow up in 6 months for office visit and labs.  Thank you for choosing [facility] at [facility] to provide your oncology and hematology care.  To afford each patient quality time with our provider, please arrive at least 15 minutes before your scheduled appointment time.   Beginning January 23rd 2017 lab work for the Cancer Center will be done in the  Main lab at [place] on 1st floor. If you have a lab appointment with the Cancer Center please come in thru the  Main Entrance and check in at the main information desk  You need to re-schedule your appointment should you arrive 10 or more minutes late.  We strive to give you quality time with our providers, and arriving late affects you and other patients whose appointments are after yours.  Also, if you no show three or more times for appointments you may be dismissed from the clinic at the providers discretion.     Again, thank you for choosing [facility].  Our hope is that these requests will decrease the amount of time that you wait before being seen by our physicians.       _____________________________________________________________  Should you have questions after your visit to [facility], please contact our office at [phone] between the hours of 8:30 a.m. and 4:30 p.m.  Voicemails left after 4:30 p.m. will not be returned until the following business day.  For prescription refill requests, have your pharmacy contact our office.         Resources For Cancer Patients and their Caregivers ? American Cancer Society: Can assist with transportation, wigs, general needs, runs Look Good Feel Better.        1-888-227-6333 ? Cancer Care: Provides financial assistance,  online support groups, medication/co-pay assistance.  1-800-813-HOPE (4673) ? [facility] Assists [place] cancer patients and their families through emotional , educational and financial support.  336-427-4357 ? [place] DSS Where to apply for food stamps, Medicaid and utility assistance. 336-342-1394 ? RCATS: Transportation to medical appointments. 336-347-2287 ? Social Security Administration: May apply for disability if have a Stage IV cancer. 336-342-7796 1-800-772-1213 ? [place] Aging, Disability and Transit Services: Assists with nutrition, care and transit needs. 336-349-2343  Cancer Center Support Programs: @10RELATIVEDAYS@ > Cancer Support Group  2nd Tuesday of the month 1pm-2pm, Journey Room  > Creative Journey  3rd Tuesday of the month 1130am-1pm, Journey Room  > Look Good Feel Better  1st Wednesday of the month 10am-12 noon, Journey Room (Call American [employer] to register 1-800-395-5775)   

## 2015-10-17 LAB — CA 125: CA 125: 17 U/mL (ref 0.0–38.1)

## 2015-12-09 ENCOUNTER — Other Ambulatory Visit (HOSPITAL_COMMUNITY): Payer: Self-pay | Admitting: Hematology & Oncology

## 2015-12-10 DIAGNOSIS — J343 Hypertrophy of nasal turbinates: Secondary | ICD-10-CM | POA: Diagnosis not present

## 2015-12-10 DIAGNOSIS — J069 Acute upper respiratory infection, unspecified: Secondary | ICD-10-CM | POA: Diagnosis not present

## 2015-12-10 DIAGNOSIS — R070 Pain in throat: Secondary | ICD-10-CM | POA: Diagnosis not present

## 2015-12-10 DIAGNOSIS — J0100 Acute maxillary sinusitis, unspecified: Secondary | ICD-10-CM | POA: Diagnosis not present

## 2015-12-25 ENCOUNTER — Other Ambulatory Visit (HOSPITAL_COMMUNITY): Payer: Self-pay | Admitting: *Deleted

## 2015-12-25 ENCOUNTER — Telehealth (HOSPITAL_COMMUNITY): Payer: Self-pay | Admitting: *Deleted

## 2015-12-25 MED ORDER — POTASSIUM CHLORIDE CRYS ER 20 MEQ PO TBCR: 20.0000 meq | EXTENDED_RELEASE_TABLET | Freq: Every day | ORAL | 3 refills | Status: DC

## 2016-02-04 ENCOUNTER — Other Ambulatory Visit (INDEPENDENT_AMBULATORY_CARE_PROVIDER_SITE_OTHER): Payer: Self-pay | Admitting: Internal Medicine

## 2016-02-08 NOTE — Telephone Encounter (Signed)
Patient will need office visit prior to next refill. 

## 2016-02-17 DIAGNOSIS — Z1389 Encounter for screening for other disorder: Secondary | ICD-10-CM | POA: Diagnosis not present

## 2016-02-17 DIAGNOSIS — J328 Other chronic sinusitis: Secondary | ICD-10-CM | POA: Diagnosis not present

## 2016-02-17 DIAGNOSIS — G620 Drug-induced polyneuropathy: Secondary | ICD-10-CM | POA: Diagnosis not present

## 2016-02-19 ENCOUNTER — Other Ambulatory Visit (HOSPITAL_COMMUNITY): Payer: Self-pay | Admitting: Internal Medicine

## 2016-02-19 DIAGNOSIS — E2839 Other primary ovarian failure: Secondary | ICD-10-CM

## 2016-02-24 ENCOUNTER — Encounter (HOSPITAL_COMMUNITY): Payer: Self-pay

## 2016-02-24 ENCOUNTER — Other Ambulatory Visit (HOSPITAL_COMMUNITY): Payer: Medicare Other

## 2016-02-24 DIAGNOSIS — R070 Pain in throat: Secondary | ICD-10-CM | POA: Diagnosis not present

## 2016-02-24 DIAGNOSIS — R062 Wheezing: Secondary | ICD-10-CM | POA: Diagnosis not present

## 2016-02-24 DIAGNOSIS — J069 Acute upper respiratory infection, unspecified: Secondary | ICD-10-CM | POA: Diagnosis not present

## 2016-02-24 DIAGNOSIS — Z1389 Encounter for screening for other disorder: Secondary | ICD-10-CM | POA: Diagnosis not present

## 2016-02-24 DIAGNOSIS — J209 Acute bronchitis, unspecified: Secondary | ICD-10-CM | POA: Diagnosis not present

## 2016-02-25 ENCOUNTER — Encounter (INDEPENDENT_AMBULATORY_CARE_PROVIDER_SITE_OTHER): Payer: Self-pay | Admitting: Internal Medicine

## 2016-02-25 NOTE — Telephone Encounter (Signed)
Patient was given an appointment for 03/08/16 at 9:00am with Deberah Castle, NP.  A letter was mailed to the patient.

## 2016-03-08 ENCOUNTER — Ambulatory Visit (INDEPENDENT_AMBULATORY_CARE_PROVIDER_SITE_OTHER): Payer: Medicare Other | Admitting: Internal Medicine

## 2016-04-14 ENCOUNTER — Other Ambulatory Visit (HOSPITAL_COMMUNITY): Payer: Self-pay | Admitting: Oncology

## 2016-04-16 ENCOUNTER — Encounter (HOSPITAL_COMMUNITY): Payer: Medicare Other | Attending: Oncology | Admitting: Oncology

## 2016-04-16 ENCOUNTER — Encounter (HOSPITAL_COMMUNITY): Payer: Medicare Other

## 2016-04-16 VITALS — BP 154/78 | HR 56 | Temp 97.9°F | Resp 20

## 2016-04-16 DIAGNOSIS — C561 Malignant neoplasm of right ovary: Secondary | ICD-10-CM

## 2016-04-16 DIAGNOSIS — G620 Drug-induced polyneuropathy: Secondary | ICD-10-CM | POA: Diagnosis not present

## 2016-04-16 DIAGNOSIS — Z86711 Personal history of pulmonary embolism: Secondary | ICD-10-CM | POA: Diagnosis not present

## 2016-04-16 LAB — CBC WITH DIFFERENTIAL/PLATELET
BASOS ABS: 0 10*3/uL (ref 0.0–0.1)
BASOS PCT: 0 %
Eosinophils Absolute: 0.2 10*3/uL (ref 0.0–0.7)
Eosinophils Relative: 5 %
HEMATOCRIT: 41.8 % (ref 36.0–46.0)
HEMOGLOBIN: 13.6 g/dL (ref 12.0–15.0)
LYMPHS PCT: 33 %
Lymphs Abs: 1.5 10*3/uL (ref 0.7–4.0)
MCH: 32 pg (ref 26.0–34.0)
MCHC: 32.5 g/dL (ref 30.0–36.0)
MCV: 98.4 fL (ref 78.0–100.0)
MONO ABS: 0.3 10*3/uL (ref 0.1–1.0)
Monocytes Relative: 7 %
NEUTROS ABS: 2.6 10*3/uL (ref 1.7–7.7)
NEUTROS PCT: 55 %
Platelets: 181 10*3/uL (ref 150–400)
RBC: 4.25 MIL/uL (ref 3.87–5.11)
RDW: 14.6 % (ref 11.5–15.5)
WBC: 4.7 10*3/uL (ref 4.0–10.5)

## 2016-04-16 LAB — COMPREHENSIVE METABOLIC PANEL
ALBUMIN: 3.8 g/dL (ref 3.5–5.0)
ALK PHOS: 75 U/L (ref 38–126)
ALT: 28 U/L (ref 14–54)
AST: 19 U/L (ref 15–41)
Anion gap: 4 — ABNORMAL LOW (ref 5–15)
BILIRUBIN TOTAL: 0.4 mg/dL (ref 0.3–1.2)
BUN: 24 mg/dL — ABNORMAL HIGH (ref 6–20)
CALCIUM: 9.3 mg/dL (ref 8.9–10.3)
CO2: 29 mmol/L (ref 22–32)
Chloride: 105 mmol/L (ref 101–111)
Creatinine, Ser: 0.85 mg/dL (ref 0.44–1.00)
GFR calc Af Amer: >60 mL/min (ref >60)
Glucose, Bld: 98 mg/dL (ref 65–99)
POTASSIUM: 4.8 mmol/L (ref 3.5–5.1)
Sodium: 138 mmol/L (ref 135–145)
TOTAL PROTEIN: 7 g/dL (ref 6.5–8.1)

## 2016-04-16 MED ORDER — GABAPENTIN 300 MG PO CAPS: 300.0000 mg | ORAL_CAPSULE | Freq: Two times a day (BID) | ORAL | 5 refills | Status: DC

## 2016-04-16 NOTE — Progress Notes (Signed)
Purvis Kilts, [place] [id number]  No diagnosis found.  CURRENT THERAPY: Surveillance per NCCN guidelines    Ovarian cancer on right [facility])   05/25/2011 Surgery    Debulking surgery at [facility] in [facility]      05/25/2011 Initial Diagnosis    Ovarian cancer      07/15/2011 - 11/04/2011 Chemotherapy    Carboplatin/Pacliatexel x 6 cycles      12/01/2011 Remission    CT scan negative for disease      05/15/2012 Imaging    CT abd/pelvis- Negative for mass or adenopathy.      INTERVAL HISTORY: NADRA HRITZ 66 y.o. female returns for followup of stage IA papillary serous cystadenocarcinoma status post surgical resection followed by adjuvant chemotherapy consisting of 6 cycles of carboplatin and paclitaxel from 07/15/2011 through 11/04/2011. At the time of presentation her cancer marker was elevated and remained elevated after surgery. It normalized with chemotherapy.   She is doing well today. The tingling in her hands and feet has been getting better over the past year. Some days are worse than others. She had a hernia repair over a year ago. She tries to walk and stay active, but her feet prevent that some days. Denies chest pain, SOB, weight loss, loss of appetite, abdominal bloating, diarrhea, constipation, vaginal bleeding, or any other concerns.   Past Medical History:  Diagnosis Date  . Abdominal hernia   . Allergic rhinitis   . Anemia associated with chemotherapy 09/16/2011  . Bone spur of other site    left foot  . Dry eyes   . Family history of kidney cancer   . Family history of prostate cancer   . Hypertension   . Ovarian cancer ([place])   . Ovarian cancer on right ([place]) 06/17/2011  . Peripheral neuropathy ([place]) 08/26/2011   Chemotherapy-induced  . Pulmonary embolism ([place])   . Sinusitis     has Ovarian cancer on right [facility]); Hypertension; H/O hysterectomy with oophorectomy; Ventral hernia; Incisional hernia,  incarcerated; Chemotherapy-induced neuropathy ([place]); Family history of kidney cancer; Family history of prostate cancer; and Genetic testing on her problem list.     is allergic to codeine.  Ms. Hubers had no medications administered during this visit.  Past Surgical History:  Procedure Laterality Date  . ABDOMINAL HYSTERECTOMY  05/25/2011   [place] bs&o and cancer staging  . CHOLECYSTECTOMY  1990  . COLONOSCOPY N/A 09/06/2014   Procedure: COLONOSCOPY;  Surgeon: Rogene Houston, MD;  [facility];  Service: Endoscopy;  Laterality: N/A;  930 - moved to 9/2 @ 8:25  . INCISIONAL HERNIA REPAIR  10/15/2014  . INCISIONAL HERNIA REPAIR  10/15/2014   Procedure: OPEN INCISIONAL HERNIA REPAIR WITH MESH AND MYOFASCIAL FLAPS;  Surgeon: Erroll Luna, MD;  Location: [place];  Service: General;;  . IVC filter  05/24/11  . PORT-A-CATH REMOVAL Right 11/21/2013   Procedure: MINOR REMOVAL PORT-A-CATH;  Surgeon: Jamesetta So, MD;  Location: [facility];  Service: General;  Laterality: Right;  . PORTACATH PLACEMENT  06/16/11   Done at [facility]  . TONSILLECTOMY     age 31's  . TUBAL LIGATION  1981   Review of Systems  Constitutional: Negative.  Negative for weight loss.       No loss of appetite  HENT: Negative.   Eyes: Negative.   Respiratory: Negative.  Negative for shortness of breath.   Cardiovascular: Negative.  Negative for  chest pain.  Gastrointestinal: Positive for abdominal pain (from hernia repair). Negative for constipation and diarrhea.       No abdominal bloating  Genitourinary: Negative.        No vaginal bleeding  Musculoskeletal: Negative.   Skin: Negative.   Neurological: Positive for tingling (improving).  Endo/Heme/Allergies: Negative.   Psychiatric/Behavioral: Negative.   All other systems reviewed and are negative. 14 point review of systems was performed and is negative except as detailed under history of present illness and above  PHYSICAL EXAMINATION ECOG  PERFORMANCE STATUS: 0 - Asymptomatic  Vitals:   04/16/16 1030  BP: (!) 154/78  Pulse: (!) 56  Resp: 20  Temp: 97.9 F (36.6 C)    Physical Exam  Constitutional: She is oriented to person, place, and time and well-developed, well-nourished, and in no distress.  HENT:  Head: Normocephalic and atraumatic.  Eyes: EOM are normal. Pupils are equal, round, and reactive to light.  Neck: Normal range of motion. Neck supple.  Cardiovascular: Normal rate, regular rhythm and normal heart sounds.   Pulmonary/Chest: Effort normal and breath sounds normal.  Abdominal: Soft. Bowel sounds are normal.  Musculoskeletal: Normal range of motion.  Neurological: She is alert and oriented to person, place, and time. Gait normal.  Skin: Skin is warm and dry.  Nursing note and vitals reviewed.  LABORATORY DATA: I have reviewed the labs below. CBC    Component Value Date/Time   WBC 4.7 04/16/2016 0951   RBC 4.25 04/16/2016 0951   HGB 13.6 04/16/2016 0951   HCT 41.8 04/16/2016 0951   PLT 181 04/16/2016 0951   MCV 98.4 04/16/2016 0951   MCH 32.0 04/16/2016 0951   MCHC 32.5 04/16/2016 0951   RDW 14.6 04/16/2016 0951   LYMPHSABS 1.5 04/16/2016 0951   MONOABS 0.3 04/16/2016 0951   EOSABS 0.2 04/16/2016 0951   BASOSABS 0.0 04/16/2016 0951   CMP Latest Ref Rng & Units 10/16/2015 03/31/2015 10/17/2014  Glucose 65 - 99 mg/dL 97 88 118(H)  BUN 6 - 20 mg/dL 22(H) 23(H) 16  Creatinine 0.44 - 1.00 mg/dL 0.74 0.84 0.89  Sodium 135 - 145 mmol/L 139 141 141  Potassium 3.5 - 5.1 mmol/L 4.0 4.9 4.4  Chloride 101 - 111 mmol/L 108 109 103  CO2 22 - 32 mmol/L 25 26 28   Calcium 8.9 - 10.3 mg/dL 9.3 8.9 8.7(L)  Total Protein 6.5 - 8.1 g/dL 6.9 6.9 5.9(L)  Total Bilirubin 0.3 - 1.2 mg/dL 0.6 0.5 0.7  Alkaline Phos 38 - 126 U/L 60 68 44  AST 15 - 41 U/L 21 19 28   ALT 14 - 54 U/L 24 18 17      Chemistry      Component Value Date/Time   NA 139 10/16/2015 0834   K 4.0 10/16/2015 0834   CL 108 10/16/2015 0834     CO2 25 10/16/2015 0834   BUN 22 (H) 10/16/2015 0834   CREATININE 0.74 10/16/2015 0834      Component Value Date/Time   CALCIUM 9.3 10/16/2015 0834   ALKPHOS 60 10/16/2015 0834   AST 21 10/16/2015 0834   ALT 24 10/16/2015 0834   BILITOT 0.6 10/16/2015 0834      CA-125: 20 (09/12/2013)  ASSESSMENT AND PLAN:  Stage I ovarian cancer Treatment related neuropathy, Taxol induced (Grade I) History of PE  She has no evidence of recurrent disease. Labs pending today. I will call her with these results.   She would like something for her neuropathy. I  recommended cymbalta since she has tried gabapentin in the past, but she does not want an antidepressant. I prescribed Gabapentin, BID with option to uptitrate to TID if no improvement.  She will return for follow up in 6 months with labs. If stable at that time, we will move it out to yearly follow ups.    THERAPY PLAN:  NCCN guidelines for surveillance of Stage I-IV Epithelial Ovarian Cancer/ Fallopian Tube Cancer/ Primary Peritoneal Cancer recommends the following:  A. Vists every 2-4 months for first 2 years, then 3-6 months for years 3-5, and then annually after 5 years.  B. Physical exam including pelvic exam  C. CA-125 or other tumor markers every visit if initially elevated.  D. Refer for genetic risk evaluation if not previously performed.   E. CBC and chemistry profile as indicated.   F. CT Chest/Abdomen/Pelvis, MRI, PET-CT, or PET (Category 2B for PET) as clinically indicated.  G. Chest X-ray as indicted.     All questions were answered. The patient knows to call the clinic with any problems, questions or concerns. We can certainly see the patient much sooner if necessary.  This document serves as a record of services personally performed by Twana First, MD. It was created on her behalf by Martinique Casey, a trained medical scribe. The creation of this record is based on the scribe's personal observations and the provider's  statements to them. This document has been checked and approved by the attending provider.  I have reviewed the above documentation for accuracy and completeness, and I agree with the above.  This note is electronically signed  04/16/2016 10:27 AM

## 2016-04-16 NOTE — Patient Instructions (Addendum)
[place] at [facility] Discharge Instructions  RECOMMENDATIONS MADE BY THE CONSULTANT AND ANY TEST RESULTS WILL BE SENT TO YOUR REFERRING PHYSICIAN.  Return in 6 months with labs  Gabapentin called into your pharmacy    Thank you for choosing [place] at [facility] to provide your oncology and hematology care.  To afford each patient quality time with our provider, please arrive at least 15 minutes before your scheduled appointment time.    If you have a lab appointment with the [place] please come in thru the  Main Entrance and check in at the main information desk  You need to re-schedule your appointment should you arrive 10 or more minutes late.  We strive to give you quality time with our providers, and arriving late affects you and other patients whose appointments are after yours.  Also, if you no show three or more times for appointments you may be dismissed from the clinic at the providers discretion.     Again, thank you for choosing [facility]  Our hope is that these requests will decrease the amount of time that you wait before being seen by our physicians.       _____________________________________________________________  Should you have questions after your visit to [facility], please contact our office at [phone] between the hours of 8:30 a.m. and 4:30 p.m.  Voicemails left after 4:30 p.m. will not be returned until the following business day.  For prescription refill requests, have your pharmacy contact our office.       Resources For Cancer Patients and their Caregivers ? American Cancer Society: Can assist with transportation, wigs, general needs, runs Look Good Feel Better.        231-060-0470 ? Cancer Care: Provides financial assistance, online support groups, medication/co-pay assistance.  1-800-813-HOPE 941-027-0545) ? [facility] Assists [place] cancer patients and their families through emotional , educational and financial support.  802-802-3688 ? [place] DSS Where to apply for food stamps, Medicaid and utility assistance. 4402409370 ? RCATS: Transportation to medical appointments. 980-360-8013 ? Social Security Administration: May apply for disability if have a Stage IV cancer. (807)114-7572 (907) 152-4498 ? [employer], Disability and Transit Services: Assists with nutrition, care and transit needs. [place] Support Programs: @10RELATIVEDAYS @ > Cancer Support Group  2nd Tuesday of the month 1pm-2pm, Journey Room  > Creative Journey  3rd Tuesday of the month 1130am-1pm, Journey Room  > Look Good Feel Better  1st Wednesday of the month 10am-12 noon, Journey Room (Call [place] to register [phone])

## 2016-04-17 LAB — CA 125: CA 125: 17.6 U/mL (ref 0.0–38.1)

## 2016-05-21 DIAGNOSIS — M25551 Pain in right hip: Secondary | ICD-10-CM | POA: Diagnosis not present

## 2016-05-21 DIAGNOSIS — M545 Low back pain: Secondary | ICD-10-CM | POA: Diagnosis not present

## 2016-05-21 DIAGNOSIS — M170 Bilateral primary osteoarthritis of knee: Secondary | ICD-10-CM | POA: Diagnosis not present

## 2016-06-04 DIAGNOSIS — M25551 Pain in right hip: Secondary | ICD-10-CM | POA: Diagnosis not present

## 2016-07-13 ENCOUNTER — Other Ambulatory Visit (HOSPITAL_COMMUNITY): Payer: Self-pay | Admitting: Oncology

## 2016-07-13 DIAGNOSIS — E876 Hypokalemia: Secondary | ICD-10-CM

## 2016-07-13 DIAGNOSIS — T502X5A Adverse effect of carbonic-anhydrase inhibitors, benzothiadiazides and other diuretics, initial encounter: Secondary | ICD-10-CM

## 2016-07-13 MED ORDER — POTASSIUM CHLORIDE CRYS ER 20 MEQ PO TBCR: 20.0000 meq | EXTENDED_RELEASE_TABLET | Freq: Every day | ORAL | 1 refills | Status: DC

## 2016-07-15 ENCOUNTER — Other Ambulatory Visit (HOSPITAL_COMMUNITY): Payer: Self-pay | Admitting: *Deleted

## 2016-08-23 ENCOUNTER — Other Ambulatory Visit (HOSPITAL_COMMUNITY): Payer: Self-pay | Admitting: Family Medicine

## 2016-08-23 DIAGNOSIS — Z1231 Encounter for screening mammogram for malignant neoplasm of breast: Secondary | ICD-10-CM

## 2016-09-03 DIAGNOSIS — M25551 Pain in right hip: Secondary | ICD-10-CM | POA: Diagnosis not present

## 2016-09-03 DIAGNOSIS — M25572 Pain in left ankle and joints of left foot: Secondary | ICD-10-CM | POA: Diagnosis not present

## 2016-09-13 ENCOUNTER — Ambulatory Visit (HOSPITAL_COMMUNITY)
Admission: RE | Admit: 2016-09-13 | Discharge: 2016-09-13 | Disposition: A | Discharge status: Home / Self Care | Payer: Medicare Other | Source: Ambulatory Visit | Attending: Family Medicine | Admitting: Family Medicine

## 2016-09-13 DIAGNOSIS — Z1231 Encounter for screening mammogram for malignant neoplasm of breast: Secondary | ICD-10-CM | POA: Diagnosis not present

## 2016-09-20 ENCOUNTER — Other Ambulatory Visit (HOSPITAL_COMMUNITY): Payer: Medicare Other

## 2016-09-27 ENCOUNTER — Ambulatory Visit (HOSPITAL_COMMUNITY)
Admission: RE | Admit: 2016-09-27 | Discharge: 2016-09-27 | Disposition: A | Discharge status: Home / Self Care | Payer: Medicare Other | Source: Ambulatory Visit | Attending: Internal Medicine | Admitting: Internal Medicine

## 2016-09-27 DIAGNOSIS — M85851 Other specified disorders of bone density and structure, right thigh: Secondary | ICD-10-CM | POA: Diagnosis not present

## 2016-09-27 DIAGNOSIS — M8580 Other specified disorders of bone density and structure, unspecified site: Secondary | ICD-10-CM | POA: Insufficient documentation

## 2016-09-27 DIAGNOSIS — E2839 Other primary ovarian failure: Secondary | ICD-10-CM | POA: Diagnosis present

## 2016-10-15 ENCOUNTER — Ambulatory Visit (HOSPITAL_COMMUNITY): Payer: Medicare Other

## 2016-10-15 ENCOUNTER — Other Ambulatory Visit (HOSPITAL_COMMUNITY): Payer: Medicare Other

## 2016-11-08 ENCOUNTER — Encounter (HOSPITAL_COMMUNITY): Payer: Self-pay | Admitting: Oncology

## 2016-11-08 ENCOUNTER — Encounter (HOSPITAL_COMMUNITY): Payer: Medicare Other | Attending: Oncology | Admitting: Oncology

## 2016-11-08 ENCOUNTER — Encounter (HOSPITAL_COMMUNITY): Payer: Medicare Other

## 2016-11-08 VITALS — BP 155/55 | HR 54 | Temp 97.5°F | Resp 16 | Ht 62.0 in | Wt 222.5 lb

## 2016-11-08 DIAGNOSIS — G629 Polyneuropathy, unspecified: Secondary | ICD-10-CM | POA: Diagnosis not present

## 2016-11-08 DIAGNOSIS — Z90710 Acquired absence of both cervix and uterus: Secondary | ICD-10-CM | POA: Diagnosis not present

## 2016-11-08 DIAGNOSIS — Z9221 Personal history of antineoplastic chemotherapy: Secondary | ICD-10-CM | POA: Insufficient documentation

## 2016-11-08 DIAGNOSIS — Z8051 Family history of malignant neoplasm of kidney: Secondary | ICD-10-CM | POA: Insufficient documentation

## 2016-11-08 DIAGNOSIS — Z86711 Personal history of pulmonary embolism: Secondary | ICD-10-CM

## 2016-11-08 DIAGNOSIS — Z9889 Other specified postprocedural states: Secondary | ICD-10-CM | POA: Diagnosis not present

## 2016-11-08 DIAGNOSIS — Z9049 Acquired absence of other specified parts of digestive tract: Secondary | ICD-10-CM | POA: Diagnosis not present

## 2016-11-08 DIAGNOSIS — I10 Essential (primary) hypertension: Secondary | ICD-10-CM | POA: Insufficient documentation

## 2016-11-08 DIAGNOSIS — C569 Malignant neoplasm of unspecified ovary: Secondary | ICD-10-CM | POA: Diagnosis not present

## 2016-11-08 DIAGNOSIS — C561 Malignant neoplasm of right ovary: Secondary | ICD-10-CM

## 2016-11-08 DIAGNOSIS — Z8543 Personal history of malignant neoplasm of ovary: Secondary | ICD-10-CM | POA: Diagnosis present

## 2016-11-08 LAB — COMPREHENSIVE METABOLIC PANEL
ALK PHOS: 67 U/L (ref 38–126)
ALT: 22 U/L (ref 14–54)
AST: 19 U/L (ref 15–41)
Albumin: 3.8 g/dL (ref 3.5–5.0)
Anion gap: 17 — ABNORMAL HIGH (ref 5–15)
BUN: 26 mg/dL — AB (ref 6–20)
CALCIUM: 10.1 mg/dL (ref 8.9–10.3)
CHLORIDE: 100 mmol/L — AB (ref 101–111)
CO2: 27 mmol/L (ref 22–32)
CREATININE: 0.96 mg/dL (ref 0.44–1.00)
GFR calc Af Amer: >60 mL/min (ref >60)
GFR, EST NON AFRICAN AMERICAN: 60 mL/min — AB (ref >60)
Glucose, Bld: 98 mg/dL (ref 65–99)
Potassium: 4.5 mmol/L (ref 3.5–5.1)
SODIUM: 144 mmol/L (ref 135–145)
Total Bilirubin: 0.5 mg/dL (ref 0.3–1.2)
Total Protein: 6.9 g/dL (ref 6.5–8.1)

## 2016-11-08 LAB — CBC WITH DIFFERENTIAL/PLATELET
BASOS ABS: 0 10*3/uL (ref 0.0–0.1)
Basophils Relative: 1 %
EOS PCT: 6 %
Eosinophils Absolute: 0.3 10*3/uL (ref 0.0–0.7)
HCT: 41.3 % (ref 36.0–46.0)
HEMOGLOBIN: 13.3 g/dL (ref 12.0–15.0)
LYMPHS ABS: 1.8 10*3/uL (ref 0.7–4.0)
LYMPHS PCT: 39 %
MCH: 31.3 pg (ref 26.0–34.0)
MCHC: 32.2 g/dL (ref 30.0–36.0)
MCV: 97.2 fL (ref 78.0–100.0)
Monocytes Absolute: 0.3 10*3/uL (ref 0.1–1.0)
Monocytes Relative: 6 %
NEUTROS PCT: 48 %
Neutro Abs: 2.2 10*3/uL (ref 1.7–7.7)
PLATELETS: 167 10*3/uL (ref 150–400)
RBC: 4.25 MIL/uL (ref 3.87–5.11)
RDW: 13.8 % (ref 11.5–15.5)
WBC: 4.5 10*3/uL (ref 4.0–10.5)

## 2016-11-08 NOTE — Progress Notes (Signed)
Sarah Walker, Sarah Walker [id number]  No diagnosis found.  CURRENT THERAPY: Surveillance per NCCN guidelines    Ovarian cancer on right [facility])   05/25/2011 Surgery    Debulking surgery at [facility] in [facility]      05/25/2011 Initial Diagnosis    Ovarian cancer      07/15/2011 - 11/04/2011 Chemotherapy    Carboplatin/Pacliatexel x 6 cycles      12/01/2011 Remission    CT scan negative for disease      05/15/2012 Imaging    CT abd/pelvis- Negative for mass or adenopathy.      INTERVAL HISTORY: Sarah Walker 66 y.o. female returns for followup of stage IA papillary serous cystadenocarcinoma status post surgical resection followed by adjuvant chemotherapy consisting of 6 cycles of carboplatin and paclitaxel from 07/15/2011 through 11/04/2011. At the time of presentation her cancer marker was elevated and remained elevated after surgery. It normalized with chemotherapy.   She is doing well today. The tingling in her hands and feet has been getting better over the past year. Some days are worse than others. She had a hernia repair over a year ago. She tries to walk and stay active, but her feet prevent that some days. Denies chest pain, SOB, weight loss, loss of appetite, abdominal bloating, diarrhea, constipation, vaginal bleeding, or any other concerns.   November 08, 2016 Patient is here for ongoing evaluation and treatment consideration and follow-up regarding ovarian cancer.  It has been 5 years since diagnosis.  Patient is going to schedule her pelvic examination with gynecologist.  She continues to have some tingling numbness in lower extremity with very slow improvement.  Does not take any medication for it. He had a mammogram done in September of 2018 which has been reported to be divided 1 had a bones density wHICH shows   Osteopenia.   Past Medical History:  Diagnosis Date  . Abdominal hernia   . Allergic rhinitis   . Anemia  associated with chemotherapy 09/16/2011  . Bone spur of other site    left foot  . Dry eyes   . Family history of kidney cancer   . Family history of prostate cancer   . Hypertension   . Ovarian cancer ([place])   . Ovarian cancer on right ([place]) 06/17/2011  . Peripheral neuropathy ([place]) 08/26/2011   Chemotherapy-induced  . Pulmonary embolism ([place])   . Sinusitis     has Ovarian cancer on right [facility]); Hypertension; H/O hysterectomy with oophorectomy; Ventral hernia; Incisional hernia, incarcerated; Chemotherapy-induced neuropathy ([place]); Family history of kidney cancer; Family history of prostate cancer; and Genetic testing on their problem list.     is allergic to codeine.  Ms. Ciani had no medications administered during this visit.  Past Surgical History:  Procedure Laterality Date  . ABDOMINAL HYSTERECTOMY  05/25/2011   [place] bs&o and cancer staging  . CHOLECYSTECTOMY  1990  . INCISIONAL HERNIA REPAIR  10/15/2014  . IVC filter  05/24/11  . PORTACATH PLACEMENT  06/16/11   Done at [facility]  . TONSILLECTOMY     age 47's  . TUBAL LIGATION  1981   Review of Systems  Constitutional: Negative.  Negative for weight loss.       No loss of appetite  HENT: Negative.   Eyes: Negative.   Respiratory: Negative.  Negative for shortness of breath.   Cardiovascular: Negative.  Negative for chest pain.  Gastrointestinal: Positive for abdominal  pain (from hernia repair). Negative for constipation and diarrhea.       No abdominal bloating  Genitourinary: Negative.        No vaginal bleeding  Musculoskeletal: Negative.   Skin: Negative.   Neurological: Positive for tingling (improving).  Endo/Heme/Allergies: Negative.   Psychiatric/Behavioral: Negative.   All other systems reviewed and are negative. 14 point review of systems was performed and is negative except as detailed under history of present illness and above  PHYSICAL EXAMINATION ECOG PERFORMANCE STATUS: 0 -  Asymptomatic  There were no vitals filed for this visit.  Physical Exam  Constitutional: She is oriented to person, place, and time and well-developed, well-nourished, and in no distress.  HENT:  Head: Normocephalic and atraumatic.  Eyes: EOM are normal. Pupils are equal, round, and reactive to light.  Neck: Normal range of motion. Neck supple.  Cardiovascular: Normal rate, regular rhythm and normal heart sounds.  Pulmonary/Chest: Effort normal and breath sounds normal.  Abdominal: Soft. Bowel sounds are normal.  Musculoskeletal: Normal range of motion.  Neurological: She is alert and oriented to person, place, and time. Gait normal.  Skin: Skin is warm and dry.  Nursing note and vitals reviewed.  LABORATORY DATA: I have reviewed the labs below. CBC    Component Value Date/Time   WBC 4.5 11/08/2016 1041   RBC 4.25 11/08/2016 1041   HGB 13.3 11/08/2016 1041   HCT 41.3 11/08/2016 1041   PLT 167 11/08/2016 1041   MCV 97.2 11/08/2016 1041   MCH 31.3 11/08/2016 1041   MCHC 32.2 11/08/2016 1041   RDW 13.8 11/08/2016 1041   LYMPHSABS 1.8 11/08/2016 1041   MONOABS 0.3 11/08/2016 1041   EOSABS 0.3 11/08/2016 1041   BASOSABS 0.0 11/08/2016 1041   CMP Latest Ref Rng & Units 04/16/2016 10/16/2015 03/31/2015  Glucose 65 - 99 mg/dL 98 97 88  BUN 6 - 20 mg/dL 24(H) 22(H) 23(H)  Creatinine 0.44 - 1.00 mg/dL 0.85 0.74 0.84  Sodium 135 - 145 mmol/L 138 139 141  Potassium 3.5 - 5.1 mmol/L 4.8 4.0 4.9  Chloride 101 - 111 mmol/L 105 108 109  CO2 22 - 32 mmol/L 29 25 26   Calcium 8.9 - 10.3 mg/dL 9.3 9.3 8.9  Total Protein 6.5 - 8.1 g/dL 7.0 6.9 6.9  Total Bilirubin 0.3 - 1.2 mg/dL 0.4 0.6 0.5  Alkaline Phos 38 - 126 U/L 75 60 68  AST 15 - 41 U/L 19 21 19   ALT 14 - 54 U/L 28 24 18      Chemistry      Component Value Date/Time   NA 138 04/16/2016 0951   K 4.8 04/16/2016 0951   CL 105 04/16/2016 0951   CO2 29 04/16/2016 0951   BUN 24 (H) 04/16/2016 0951   CREATININE 0.85 04/16/2016  0951      Component Value Date/Time   CALCIUM 9.3 04/16/2016 0951   ALKPHOS 75 04/16/2016 0951   AST 19 04/16/2016 0951   ALT 28 04/16/2016 0951   BILITOT 0.4 04/16/2016 0951      CA-125: 17.6 (April, 2018) Density (September, 2018) osteopenia Mammogram (screening in September of 2018) BI-RADS 1    ASSESSMENT AND PLAN:  Stage I ovarian cancer Treatment related neuropathy, Taxol induced (Grade I) History of PE  She has no evidence of recurrent disease. Labs pending today. I will call her with these results.  She did not take any Cymbalta after reading the side effects. Neuropathy persist with slow and gradual improvement (secondary  to Taxol chemotherapy) 125 is pending chemistries pending On clinical grounds there is no evidence of recurrent or progressive disease  I would recommend a yearly checkup now THERAPY PLAN:  NCCN guidelines for surveillance of Stage I-IV Epithelial Ovarian Cancer/ Fallopian Tube Cancer/ Primary Peritoneal Cancer recommends the following:  A. Vists every 2-4 months for first 2 years, then 3-6 months for years 3-5, and then annually after 5 years.  B. Physical exam including pelvic exam  C. CA-125 or other tumor markers every visit if initially elevated.  D. Refer for genetic risk evaluation if not previously performed.   E. CBC and chemistry profile as indicated.   F. CT Chest/Abdomen/Pelvis, MRI, PET-CT, or PET (Category 2B for PET) as clinically indicated.  G. Chest X-ray as indicted.       I have reviewed the above documentation for accuracy and completeness, and I agree with the above.  This note is electronically signed  11/08/2016 11:10 AM

## 2016-11-09 LAB — CA 125: Cancer Antigen (CA) 125: 18.3 U/mL (ref 0.0–38.1)

## 2016-11-29 ENCOUNTER — Other Ambulatory Visit (HOSPITAL_COMMUNITY): Payer: Self-pay | Admitting: Oncology

## 2016-11-29 DIAGNOSIS — T502X5A Adverse effect of carbonic-anhydrase inhibitors, benzothiadiazides and other diuretics, initial encounter: Secondary | ICD-10-CM

## 2016-11-29 DIAGNOSIS — E876 Hypokalemia: Secondary | ICD-10-CM

## 2017-01-12 DIAGNOSIS — Z1389 Encounter for screening for other disorder: Secondary | ICD-10-CM | POA: Diagnosis not present

## 2017-01-12 DIAGNOSIS — I10 Essential (primary) hypertension: Secondary | ICD-10-CM | POA: Diagnosis not present

## 2017-01-21 ENCOUNTER — Other Ambulatory Visit (INDEPENDENT_AMBULATORY_CARE_PROVIDER_SITE_OTHER): Payer: Self-pay | Admitting: Internal Medicine

## 2017-01-21 DIAGNOSIS — M25512 Pain in left shoulder: Secondary | ICD-10-CM | POA: Diagnosis not present

## 2017-01-21 DIAGNOSIS — M25511 Pain in right shoulder: Secondary | ICD-10-CM | POA: Diagnosis not present

## 2017-02-18 DIAGNOSIS — H524 Presbyopia: Secondary | ICD-10-CM | POA: Diagnosis not present

## 2017-02-18 DIAGNOSIS — H2513 Age-related nuclear cataract, bilateral: Secondary | ICD-10-CM | POA: Diagnosis not present

## 2017-03-02 ENCOUNTER — Telehealth: Payer: Self-pay | Admitting: Adult Health

## 2017-03-02 ENCOUNTER — Other Ambulatory Visit: Payer: Self-pay

## 2017-03-02 ENCOUNTER — Encounter: Payer: Self-pay | Admitting: Adult Health

## 2017-03-02 ENCOUNTER — Other Ambulatory Visit (HOSPITAL_COMMUNITY)
Admission: RE | Admit: 2017-03-02 | Discharge: 2017-03-02 | Disposition: A | Discharge status: Home / Self Care | Payer: Medicare Other | Source: Ambulatory Visit | Attending: Adult Health | Admitting: Adult Health

## 2017-03-02 ENCOUNTER — Ambulatory Visit (INDEPENDENT_AMBULATORY_CARE_PROVIDER_SITE_OTHER): Payer: Medicare Other | Admitting: Adult Health

## 2017-03-02 VITALS — BP 126/82 | HR 68 | Ht 62.0 in | Wt 220.0 lb

## 2017-03-02 DIAGNOSIS — Z08 Encounter for follow-up examination after completed treatment for malignant neoplasm: Secondary | ICD-10-CM | POA: Diagnosis not present

## 2017-03-02 DIAGNOSIS — Z8543 Personal history of malignant neoplasm of ovary: Secondary | ICD-10-CM | POA: Insufficient documentation

## 2017-03-02 DIAGNOSIS — N952 Postmenopausal atrophic vaginitis: Secondary | ICD-10-CM | POA: Diagnosis not present

## 2017-03-02 DIAGNOSIS — Z1211 Encounter for screening for malignant neoplasm of colon: Secondary | ICD-10-CM | POA: Diagnosis not present

## 2017-03-02 DIAGNOSIS — Z1212 Encounter for screening for malignant neoplasm of rectum: Secondary | ICD-10-CM | POA: Diagnosis not present

## 2017-03-02 DIAGNOSIS — N941 Unspecified dyspareunia: Secondary | ICD-10-CM

## 2017-03-02 DIAGNOSIS — Z90710 Acquired absence of both cervix and uterus: Secondary | ICD-10-CM | POA: Diagnosis not present

## 2017-03-02 DIAGNOSIS — Z01411 Encounter for gynecological examination (general) (routine) with abnormal findings: Secondary | ICD-10-CM

## 2017-03-02 DIAGNOSIS — Z01419 Encounter for gynecological examination (general) (routine) without abnormal findings: Secondary | ICD-10-CM

## 2017-03-02 LAB — HEMOCCULT GUIAC POC 1CARD (OFFICE): FECAL OCCULT BLD: NEGATIVE

## 2017-03-02 MED ORDER — ESTRADIOL 10 MCG VA TABS: ORAL_TABLET | VAGINAL | 3 refills | Status: DC

## 2017-03-02 NOTE — Telephone Encounter (Signed)
Pt has rx for neurontin 300 mg at home is going to try 1 af hs to see if helps with tingling in toes

## 2017-03-02 NOTE — Progress Notes (Addendum)
Subjective:     Patient ID: Sarah Walker, female   DO: 11/01/1950, 67 y.o.   MRN: [id number]  HPI Lorette is a 67 year old white female in for a pelvic exam and pap, she is sp hysterectomy for ovarian cancer in 2013  1A. She had hernia surgery since last visit.Her last CA125 was 18.3 on 11/08/16. She is complaining of discomfort with sex and feels like air passes in vagina if sitting and passes gas. Has tingling in burning in her toes, and fingers but worse in toes, since had chemotherapy, she tried Neurontin years ago, but made her feel confused, but was taking 4, but she thinks it helped. She is on mobic for her knees and Prilosec, but when she takes Prilosec stomach hurts, so try prevacid. She said she had genetic testing done and her ovarian cancer was environmental not genetic she said.  PCP is [place].   Review of Systems +pain with sex Air in vagina when passes gas Tingling in toes and fingers   Reviewed past medical,surgical, social and family history. Reviewed medications and allergies.  Objective:   Physical Exam BP 126/82 (BP Location: Right Arm, Patient Position: Sitting, Cuff Size: Normal)   Pulse 68   Ht 5\' 2"  (1.575 m)   Wt 220 lb (99.8 kg)   BMI 40.24 kg/m    Skin warm and dry.Pelvic: external genitalia is normal in appearance no lesions, vagina:pale with loss of moisture and rugae,urethra has no lesions or masses noted, cervix and uterus are absent, no fistulas seen,pap with HPV performed,adnexa: no masses or tenderness noted. Bladder is non tender and no masses felt.  On rectal exam has good tone, no masses and hemoccult was negative. Discussed with Dr Elonda Husky about using some vaginal estrogen and he suggests vagifem 10 mcg.Will continue with pap smears every 2 years. She will call be back with the dose of Neurontin she has at home, may try again in lower dose.  Assessment:     1. Visit for pelvic exam   2. Vaginal Pap smear following hysterectomy for malignancy   3.  Vaginal atrophy   4. History of ovarian cancer   5. Screening for colorectal cancer       Plan:   Pap with HPV sent Meds ordered this encounter  Medications  . Estradiol 10 MCG TABS vaginal tablet    Sig: Use 1 in vagina daily for 2 weeks and then 2 x a week    Dispense:  18 tablet    Refill:  3    Order Specific Question:   Supervising Provider    Answer:   Tania Ade H [[id number]]  Use good lubricate with sex    F/U in 6 weeks Pap and pelvic in 2 years

## 2017-03-03 LAB — CYTOLOGY - PAP
DIAGNOSIS: NEGATIVE
HPV: NOT DETECTED

## 2017-03-17 ENCOUNTER — Telehealth (HOSPITAL_COMMUNITY): Payer: Self-pay

## 2017-03-17 NOTE — Telephone Encounter (Signed)
Patient called and states that her gynecologist put her on Yuvafem vaginal inserts. She has a history of ovarian cancer and wanted to be sure it was okay with the oncologist if she used the inserts. I attempted to call the patient to find out dosage and how often she plans to use them but had to leave a message. Will try again tomorrow.

## 2017-03-18 ENCOUNTER — Other Ambulatory Visit (HOSPITAL_COMMUNITY): Payer: Self-pay | Admitting: Orthopedic Surgery

## 2017-03-18 DIAGNOSIS — M25511 Pain in right shoulder: Secondary | ICD-10-CM

## 2017-03-18 NOTE — Telephone Encounter (Signed)
I discussed this with our Gyn-Onc colleagues.  It should be fine for her to use the vaginal estrogen, since likely much of the estrogen will not be absorbed systemically.  It is also reassuring that she is over 5 years out from her ovarian cancer diagnosis without any recurrence.  It should be fine for her to use the [facility] as prescribed by her gynecologist.    Mike Craze, NP [place] [phone]

## 2017-03-18 NOTE — Telephone Encounter (Signed)
Patient called back and wants to be sure it is okay with the oncologist for her to use this long term. She states she is very concerned about this causing her to have cancer again.

## 2017-03-18 NOTE — Telephone Encounter (Signed)
  Called and left patient a message, as she requested, that it is okay with oncologist to use the Yuvafem vaginal inserts. Call cancer center if any other questions.

## 2017-03-21 DIAGNOSIS — D225 Melanocytic nevi of trunk: Secondary | ICD-10-CM | POA: Diagnosis not present

## 2017-03-21 DIAGNOSIS — L820 Inflamed seborrheic keratosis: Secondary | ICD-10-CM | POA: Diagnosis not present

## 2017-03-22 NOTE — Telephone Encounter (Signed)
Notified patient who verbalized understanding and expressed thanks.

## 2017-03-22 NOTE — Telephone Encounter (Signed)
It should be safe, but we can discuss further at her next follow-up visit if she is still concerned.   Mike Craze, NP [place] [phone]

## 2017-03-25 ENCOUNTER — Other Ambulatory Visit: Payer: Self-pay | Admitting: Orthopedic Surgery

## 2017-03-25 ENCOUNTER — Ambulatory Visit (HOSPITAL_COMMUNITY)
Admission: RE | Admit: 2017-03-25 | Discharge: 2017-03-25 | Disposition: A | Discharge status: Home / Self Care | Payer: Medicare Other | Source: Ambulatory Visit | Attending: Orthopedic Surgery | Admitting: Orthopedic Surgery

## 2017-03-25 DIAGNOSIS — G8929 Other chronic pain: Secondary | ICD-10-CM

## 2017-03-25 DIAGNOSIS — M25511 Pain in right shoulder: Secondary | ICD-10-CM

## 2017-04-08 ENCOUNTER — Ambulatory Visit
Admission: RE | Admit: 2017-04-08 | Discharge: 2017-04-08 | Disposition: A | Discharge status: Home / Self Care | Payer: Medicare Other | Source: Ambulatory Visit | Attending: Orthopedic Surgery | Admitting: Orthopedic Surgery

## 2017-04-08 DIAGNOSIS — M25511 Pain in right shoulder: Secondary | ICD-10-CM | POA: Diagnosis not present

## 2017-04-08 DIAGNOSIS — G8929 Other chronic pain: Secondary | ICD-10-CM

## 2017-04-15 ENCOUNTER — Ambulatory Visit: Payer: Medicare Other | Admitting: Adult Health

## 2017-04-15 DIAGNOSIS — M25511 Pain in right shoulder: Secondary | ICD-10-CM | POA: Diagnosis not present

## 2017-05-23 DIAGNOSIS — J302 Other seasonal allergic rhinitis: Secondary | ICD-10-CM | POA: Diagnosis not present

## 2017-05-23 DIAGNOSIS — J029 Acute pharyngitis, unspecified: Secondary | ICD-10-CM | POA: Diagnosis not present

## 2017-05-23 DIAGNOSIS — R42 Dizziness and giddiness: Secondary | ICD-10-CM | POA: Diagnosis not present

## 2017-05-23 DIAGNOSIS — Z1389 Encounter for screening for other disorder: Secondary | ICD-10-CM | POA: Diagnosis not present

## 2017-05-24 ENCOUNTER — Other Ambulatory Visit (HOSPITAL_COMMUNITY): Payer: Self-pay | Admitting: Adult Health

## 2017-05-24 DIAGNOSIS — T502X5A Adverse effect of carbonic-anhydrase inhibitors, benzothiadiazides and other diuretics, initial encounter: Secondary | ICD-10-CM

## 2017-05-24 DIAGNOSIS — E876 Hypokalemia: Secondary | ICD-10-CM

## 2017-05-31 ENCOUNTER — Telehealth (HOSPITAL_COMMUNITY): Payer: Self-pay

## 2017-05-31 NOTE — Telephone Encounter (Signed)
Pt has another MD apptment on this date will r/s when she come in next visit

## 2017-06-02 DIAGNOSIS — M19011 Primary osteoarthritis, right shoulder: Secondary | ICD-10-CM | POA: Diagnosis not present

## 2017-06-02 DIAGNOSIS — M7521 Bicipital tendinitis, right shoulder: Secondary | ICD-10-CM | POA: Diagnosis not present

## 2017-06-02 DIAGNOSIS — M7551 Bursitis of right shoulder: Secondary | ICD-10-CM | POA: Diagnosis not present

## 2017-06-02 DIAGNOSIS — M7541 Impingement syndrome of right shoulder: Secondary | ICD-10-CM | POA: Diagnosis not present

## 2017-06-02 DIAGNOSIS — S46011A Strain of muscle(s) and tendon(s) of the rotator cuff of right shoulder, initial encounter: Secondary | ICD-10-CM | POA: Diagnosis not present

## 2017-06-02 DIAGNOSIS — M75121 Complete rotator cuff tear or rupture of right shoulder, not specified as traumatic: Secondary | ICD-10-CM | POA: Diagnosis not present

## 2017-06-02 DIAGNOSIS — G8918 Other acute postprocedural pain: Secondary | ICD-10-CM | POA: Diagnosis not present

## 2017-06-02 DIAGNOSIS — M24111 Other articular cartilage disorders, right shoulder: Secondary | ICD-10-CM | POA: Diagnosis not present

## 2017-06-06 ENCOUNTER — Ambulatory Visit (HOSPITAL_COMMUNITY): Payer: Medicare Other

## 2017-06-06 ENCOUNTER — Encounter (HOSPITAL_COMMUNITY): Payer: Self-pay

## 2017-06-08 ENCOUNTER — Other Ambulatory Visit: Payer: Self-pay

## 2017-06-08 ENCOUNTER — Encounter (HOSPITAL_COMMUNITY): Payer: Self-pay

## 2017-06-08 ENCOUNTER — Ambulatory Visit (HOSPITAL_COMMUNITY): Payer: Medicare Other | Attending: Orthopedic Surgery

## 2017-06-08 ENCOUNTER — Other Ambulatory Visit (HOSPITAL_COMMUNITY): Payer: Self-pay | Admitting: Adult Health

## 2017-06-08 DIAGNOSIS — R29898 Other symptoms and signs involving the musculoskeletal system: Secondary | ICD-10-CM | POA: Diagnosis not present

## 2017-06-08 DIAGNOSIS — M25511 Pain in right shoulder: Secondary | ICD-10-CM | POA: Diagnosis not present

## 2017-06-08 DIAGNOSIS — T502X5A Adverse effect of carbonic-anhydrase inhibitors, benzothiadiazides and other diuretics, initial encounter: Secondary | ICD-10-CM

## 2017-06-08 DIAGNOSIS — M25611 Stiffness of right shoulder, not elsewhere classified: Secondary | ICD-10-CM | POA: Diagnosis not present

## 2017-06-08 DIAGNOSIS — E876 Hypokalemia: Secondary | ICD-10-CM

## 2017-06-08 NOTE — Patient Instructions (Signed)
   COMPLETE PENDULUM EXERCISES FOR 30 SECONDS TO A MINUTE EACH, 3-5 TIMES PER DAY. ROM: Pendulum (Side-to-Side)   Deje que el brazo derecho se balancee suavemente de lado a lado mientras oscila el cuerpo de [place]. Repita ____ veces por rutina. Realice ____ rutinas por sesin. Realice ____ sesiones por da.  http://orth.exer.us/792   Copyright  VHI. All rights reserved.  Pendulum Forward/Back   Bend forward 90 at waist, using table for support. Rock body forward and back to swing arm. Repeat ____ times. Do ____ sessions per day.    AROM: Wrist Extension   With right palm down, bend wrist up. Repeat 10____ times per set. Do ____ sets per session. Do __3__ sessions per day.  Copyright  VHI. All rights reserved.   AROM: Wrist Flexion   With right palm up, bend wrist up. Repeat ___10_ times per set. Do ____ sets per session. Do __3__ sessions per day.  Copyright  VHI. All rights reserved.   AROM: Forearm Pronation / Supination   With right arm in handshake position, slowly rotate palm down until stretch is felt. Relax. Then rotate palm up until stretch is felt. Repeat __10__ times per set. Do ____ sets per session. Do __3__ sessions per day.  Copyright  VHI. All rights reserved.   AFlexion (Passive)   Use other hand to bend elbow, with thumb toward same shoulder. Do NOT force this motion. Hold ____ seconds. Repeat ____ times. Do ____ sessions per day.

## 2017-06-08 NOTE — Therapy (Addendum)
[place], [place], [address] Phone: [phone]   Fax:  [phone]  Occupational Therapy Evaluation  Patient Details  Name: Sarah Walker MRN: [id number] Date of Birth: 08/13/1950 Referring Provider: Fredonia Highland, MD   Encounter Date: 06/08/2017  OT End of Session - 06/08/17 1409    Visit Number  1    Number of Visits  24    Date for OT Re-Evaluation  08/31/17 mini reassessment: 07/06/17    Authorization Type  1) [employer] medicare $40 copay 2) [employer] no authorization needed.     OT Start Time  1030    OT Stop Time  1130    OT Time Calculation (min)  60 min    Activity Tolerance  Patient tolerated treatment well    Behavior During Therapy  WFL for tasks assessed/performed       Past Medical History:  Diagnosis Date  . Abdominal hernia   . Allergic rhinitis   . Anemia associated with chemotherapy 09/16/2011  . Bone spur of other site    left foot  . Dry eyes   . Family history of kidney cancer   . Family history of prostate cancer   . Hypertension   . Ovarian cancer ([place])   . Ovarian cancer on right ([place]) 06/17/2011  . Peripheral neuropathy 08/26/2011   Chemotherapy-induced  . Pulmonary embolism ([place])   . Sinusitis     Past Surgical History:  Procedure Laterality Date  . ABDOMINAL HYSTERECTOMY  05/25/2011   [place] bs&o and cancer staging  . CHOLECYSTECTOMY  1990  . COLONOSCOPY N/A 09/06/2014   Procedure: COLONOSCOPY;  Surgeon: Rogene Houston, MD;  [facility]: [facility];  Service: Endoscopy;  Laterality: N/A;  930 - moved to 9/2 @ 8:25  . INCISIONAL HERNIA REPAIR  10/15/2014  . INCISIONAL HERNIA REPAIR  10/15/2014   Procedure: OPEN INCISIONAL HERNIA REPAIR WITH MESH AND MYOFASCIAL FLAPS;  Surgeon: Erroll Luna, MD;  Location: [place];  Service: General;;  . IVC filter  05/24/11  . PORT-A-CATH REMOVAL Right 11/21/2013   Procedure: MINOR REMOVAL PORT-A-CATH;  Surgeon: Jamesetta So, MD;  Location: [facility];   Service: General;  Laterality: Right;  . PORTACATH PLACEMENT  06/16/11   Done at [facility]  . TONSILLECTOMY     age 67's  . TUBAL LIGATION  1981    There were no vitals filed for this visit.     06/12/17 1459  Symptoms/Limitations  Subjective  S: I haven't taken the sling off myself.   Pertinent History Patient is a 67 y/o female S/P right RTC repair which was completed on 06/02/17. Patient is currently in a sling 24/7. Dr. Percell Miller has referred patient to occupational therapy for evaluation and treatment.   Patient Stated Goals To be able to use her arm normally.   Pain Assessment  Currently in Pain? Yes  Pain Score 4  Pain Location Shoulder  Pain Orientation Right  Pain Descriptors / Indicators Aching  Pain Type Surgical pain  Pain Radiating Towards upper arm and elbow  Pain Onset 1 to 4 weeks ago  Pain Frequency Constant  Aggravating Factors  movement  Pain Relieving Factors rest, pain medication  Effect of Pain on Daily Activities Pt is unable to utilize her right UE for any daily tasks at this time      [facility] OT Assessment - 06/08/17 1049      Assessment   Medical Diagnosis  Right RTC repair  Referring Provider  Fredonia Highland, MD    Onset Date/Surgical Date  06/02/17    Hand Dominance  Right    Next MD Visit  -- Next week. Either 10th or 12th    Prior Therapy  None      Precautions   Precautions  Shoulder    Type of Shoulder Precautions  See protocol from Dr. 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      ADL   ADL comments  Patient is currently unable to use RUE for any daily tasks.       Mobility   Mobility Status  Independent      Written Expression   Dominant Hand  Right      Vision - History   Baseline Vision  No visual deficits      Cognition   Overall Cognitive Status  Within Functional Limits for tasks assessed      Observation/Other Assessments   Focus on Therapeutic Outcomes (FOTO)   4/100      ROM / Strength   AROM / PROM / Strength  PROM;AROM;Strength      Palpation   Palpation comment  Moderate fascial restrictions in right upper trapezius, scapularis region and along posterior and lateral portion of neck      AROM   Overall AROM   Unable to assess;Due to precautions      PROM   Overall PROM Comments  Assessed supine. IR/er adducted    PROM Assessment Site  Shoulder    Right/Left Shoulder  Right    Right Shoulder Flexion  115 Degrees    Right Shoulder ABduction  70 Degrees per protocol parameters    Right Shoulder Internal Rotation  90 Degrees    Right Shoulder External Rotation  30 Degrees per protocol parameters      Strength   Overall Strength  Unable to assess;Due to precautions                      OT Education - 06/08/17 1406    Education Details  pendulums, wrist and elbow A/ROM.    Person(s) Educated  Patient    Methods  Explanation;Demonstration;Handout;Verbal cues    Comprehension  Returned demonstration;Verbalized understanding;Need further instruction       OT Short Term Goals - 06/08/17 1414      OT SHORT TERM GOAL #1   Title  Patient will be educated and independent with HEP to faciliate progress in therapy and allow her to begin using her RUE for her daily tasks.     Time  6    Period  Weeks    Status  New    Target Date  07/20/17      OT SHORT TERM GOAL #2  Title   Patient will increase P/ROM of RUE to [facility] to increase ability to complete dressing tasks with less difficulty and with independence.     Time  6    Period  Weeks    Status  New      OT SHORT TERM GOAL #3   Title  Patient will increase RUE strength to 3+/5 to increase ability to complete waist level household tasks and be able to return to piano playing.     Time  6    Period  Weeks    Status  New      OT SHORT TERM GOAL #4   Title  Patient will decrease fascial restrictions to min amount in RUE in order to increase functional mobility needed to for reaching tasks.     Time  6    Period  Weeks    Status  New      OT SHORT TERM GOAL #5   Title  Patient will report a pain level score of 4/10 when using RUE during daily tasks.     Time  6    Period  Weeks    Status  New        OT Long Term Goals - 06/08/17 1425      OT LONG TERM GOAL #1   Title  Patient will return to highest level of independence while using her RUE as her dominant extremity for all daily and work tasks.     Time  12    Period  Weeks    Status  New    Target Date  08/31/17      OT LONG TERM GOAL #2   Title  Patient will increase A/ROM to WNL to increase ability to complete overhead reaching tasks with less difficulty.     Time  12    Period  Weeks    Status  New      OT LONG TERM GOAL #3   Title  Patient will increase RUE strength to 4+/5 to be able to return to normal household lifting tasks.     Time  12    Period  Weeks    Status  New      OT LONG TERM GOAL #4   Title  Patient will decrease fascial restrictions to trace amount in RUE in order to increase functional mobility needed for overhead reaching.     Time  12    Period  Weeks    Status  New      OT LONG TERM GOAL #5   Title  Patient will report a decrease in pain level of 2/10 or less when completing daily tasks with her RUE.     Time  12    Period  Weeks    Status  New            Plan - 06/08/17 1411    Clinical Impression  Statement  A: Patient is a 67 y/o female S/P right RTC repair causing increased pain, fascial restrictions, and decreased ROM and strength resulting in difficulty completing daily tasks using her RUE.     Occupational Profile and client history currently impacting functional performance  motivated to return to prior level of function, strong social support at home.     Occupational performance deficits (Please refer to evaluation for details):  ADL's;Work;Rest and Sleep;Leisure    Rehab Potential  Excellent    Current Impairments/barriers affecting progress:  None noted.  OT Frequency  2x / week    OT Duration  12 weeks    OT Treatment/Interventions  Self-care/ADL training;Moist Heat;DME and/or AE instruction;Therapeutic activities;Ultrasound;Therapeutic exercise;Passive range of motion;Neuromuscular education;Cryotherapy;Electrical Stimulation;Manual Therapy;Patient/family education    Plan  P: Patient will benefit from skilled OT services to increase functional performance during daily tasks when using her RUE. Treatment Plan; Follow protocol. Myofascial release, manual stretching, P/ROM , AA/ROM, A/ROM. general strengthening and stability exercises of the shoulder. Modalities PRN. Next session: Add seated scapular depression, extension, and row to HEP.     Clinical Decision Making  Several treatment options, min-mod task modification necessary    Consulted and Agree with Plan of Care  Patient       Patient will benefit from skilled therapeutic intervention in order to improve the following deficits and impairments:  Decreased range of motion, Pain, Impaired UE functional use, Decreased strength, Increased fascial restrictions  Visit Diagnosis: Other symptoms and signs involving the musculoskeletal system - Plan: Ot plan of care cert/re-cert  Acute pain of right shoulder - Plan: Ot plan of care cert/re-cert  Stiffness of right shoulder, not elsewhere classified - Plan: Ot plan of care  cert/re-cert    Problem List Patient Active Problem List   Diagnosis Date Noted  . Vaginal Pap smear following hysterectomy for malignancy 03/02/2017  . Screening for colorectal cancer 03/02/2017  . Vaginal atrophy 03/02/2017  . History of ovarian cancer 03/02/2017  . Genetic testing 05/29/2015  . Family history of kidney cancer   . Family history of prostate cancer   . Chemotherapy-induced neuropathy ([place]) 03/31/2015  . Incisional hernia, incarcerated 10/15/2014  . Ventral hernia 07/15/2014  . Ovarian cancer on right ([place]) 06/17/2011  . Hypertension 06/17/2011  . H/O hysterectomy with oophorectomy 06/17/2011   Sarah Walker, Sarah Walker,Sarah Walker  [phone]  06/08/2017, 2:47 PM  [place] [address] [place], [place], [address] Phone: [phone]   Fax:  [phone]  Name: Sarah Walker MRN: [id number] Date of Birth: 1950-12-23

## 2017-06-10 ENCOUNTER — Ambulatory Visit (HOSPITAL_COMMUNITY): Payer: Medicare Other | Admitting: Specialist

## 2017-06-10 ENCOUNTER — Encounter (HOSPITAL_COMMUNITY): Payer: Self-pay | Admitting: Specialist

## 2017-06-10 DIAGNOSIS — R29898 Other symptoms and signs involving the musculoskeletal system: Secondary | ICD-10-CM | POA: Diagnosis not present

## 2017-06-10 DIAGNOSIS — M25511 Pain in right shoulder: Secondary | ICD-10-CM | POA: Diagnosis not present

## 2017-06-10 DIAGNOSIS — M25611 Stiffness of right shoulder, not elsewhere classified: Secondary | ICD-10-CM | POA: Diagnosis not present

## 2017-06-10 NOTE — Therapy (Signed)
[facility] [place], [place], [address] Phone: [phone]   Fax:  [phone]  Occupational Therapy Treatment  Patient Details  Name: Sarah Walker MRN: [id number] Date of Birth: 17-Jan-1950 Referring Provider: Fredonia Highland, MD   Encounter Date: 06/10/2017  OT End of Session - 06/10/17 1326    Visit Number  2    Number of Visits  24    Date for OT Re-Evaluation  08/31/17 mini reassess on 07/06/17    Authorization Type  1) [employer] medicare $40 copay 2) [employer] no authorization needed.     OT Start Time  1115    OT Stop Time  1155    OT Time Calculation (min)  40 min    Activity Tolerance  Patient tolerated treatment well    Behavior During Therapy  WFL for tasks assessed/performed       Past Medical History:  Diagnosis Date  . Abdominal hernia   . Allergic rhinitis   . Anemia associated with chemotherapy 09/16/2011  . Bone spur of other site    left foot  . Dry eyes   . Family history of kidney cancer   . Family history of prostate cancer   . Hypertension   . Ovarian cancer ([place])   . Ovarian cancer on right ([place]) 06/17/2011  . Peripheral neuropathy 08/26/2011   Chemotherapy-induced  . Pulmonary embolism ([place])   . Sinusitis     Past Surgical History:  Procedure Laterality Date  . ABDOMINAL HYSTERECTOMY  05/25/2011   [place] bs&o and cancer staging  . CHOLECYSTECTOMY  1990  . COLONOSCOPY N/A 09/06/2014   Procedure: COLONOSCOPY;  Surgeon: Rogene Houston, MD;  [facility];  Service: Endoscopy;  Laterality: N/A;  930 - moved to 9/2 @ 8:25  . INCISIONAL HERNIA REPAIR  10/15/2014  . INCISIONAL HERNIA REPAIR  10/15/2014   Procedure: OPEN INCISIONAL HERNIA REPAIR WITH MESH AND MYOFASCIAL FLAPS;  Surgeon: Erroll Luna, MD;  Location: [facility];  Service: General;;  . IVC filter  05/24/11  . PORT-A-CATH REMOVAL Right 11/21/2013   Procedure: MINOR REMOVAL PORT-A-CATH;  Surgeon: Jamesetta So, MD;  Location: [facility];  Service:  General;  Laterality: Right;  . PORTACATH PLACEMENT  06/16/11   Done at [facility]  . TONSILLECTOMY     age 24's  . TUBAL LIGATION  1981    There were no vitals filed for this visit.  Subjective Assessment - 06/10/17 1325    Subjective   S:  I havent been moving it to much, so it isnt hurting.     Currently in Pain?  Yes    Pain Score  5     Pain Location  Shoulder    Pain Orientation  Right    Pain Descriptors / Indicators  Aching    Pain Type  Surgical pain    Pain Radiating Towards  upper arm and elbow    Pain Onset  1 to 4 weeks ago    Pain Frequency  Constant    Aggravating Factors   movement    Pain Relieving Factors  rest, medication    Effect of Pain on Daily Activities  moderate         [facility] OT Assessment - 06/10/17 0001      Assessment   Medical Diagnosis  Right RTC repair       Precautions   Precautions  Shoulder    Type of Shoulder Precautions  See protocol  from Dr. Percell Miller    Shoulder Interventions  Shoulder sling/immobilizer;Shoulder abduction pillow;Off for dressing/bathing/exercises               OT Treatments/Exercises (OP) - 06/10/17 0001      Exercises   Exercises  Shoulder      Shoulder Exercises: Supine   Protraction  PROM;5 reps    External Rotation  PROM;5 reps    External Rotation Limitations  to 30    Internal Rotation  PROM;5 reps    Flexion  PROM;5 reps    Flexion Limitations  to 90    ABduction  PROM;5 reps    ABduction Limitations  to 70      Shoulder Exercises: Seated   Elevation  AROM;10 reps    Extension  AROM;10 reps    Row  AROM;10 reps    Other Seated Exercises  elbow flexion/extension, supination/pronation, wrist flexion/extension 10 times each A/ROM      Shoulder Exercises: Therapy Ball   Flexion  10 reps    ABduction  10 reps      Shoulder Exercises: Isometric Strengthening   Flexion  Supine;3X3"    Extension  Supine;3X3"    External Rotation  Supine;3X3"    Internal Rotation  Supine;3X3"     ABduction  Supine;3X3"    ADduction  Supine;3X3"      Manual Therapy   Manual Therapy  Myofascial release    Manual therapy comments  manual therapy completed seperately from all other interventions this date    Myofascial Release  myofascial release and manual stretching to right upper arm, scapular, and shoulder region to decrease pain and restrictions and imrpove pain free mobility needed for resumption of right arm as dominant             OT Education - 06/10/17 1325    Education Details  reviewed plan of care and issued patient a written copy    Person(s) Educated  Patient    Methods  Explanation;Handout    Comprehension  Verbalized understanding       OT Short Term Goals - 06/10/17 1329      OT SHORT TERM GOAL #1   Title  Patient will be educated and independent with HEP to faciliate progress in therapy and allow her to begin using her RUE for her daily tasks.     Time  6    Period  Weeks    Status  On-going      OT SHORT TERM GOAL #2   Title  Patient will increase P/ROM of RUE to [place] to increase ability to complete dressing tasks with less difficulty and with independence.     Time  6    Period  Weeks    Status  On-going      OT SHORT TERM GOAL #3   Title  Patient will increase RUE strength to 3+/5 to increase ability to complete waist level household tasks and be able to return to piano playing.     Time  6    Period  Weeks    Status  On-going      OT SHORT TERM GOAL #4   Title  Patient will decrease fascial restrictions to min amount in RUE in order to increase functional mobility needed to for reaching tasks.     Time  6    Period  Weeks    Status  On-going      OT SHORT TERM GOAL #5   Title  Patient  will report a pain level score of 4/10 when using RUE during daily tasks.     Time  6    Period  Weeks    Status  On-going        OT Long Term Goals - 06/10/17 1329      OT LONG TERM GOAL #1   Title  Patient will return to highest level of  independence while using her RUE as her dominant extremity for all daily and work tasks.     Time  12    Period  Weeks    Status  On-going      OT LONG TERM GOAL #2   Title  Patient will increase A/ROM to WNL to increase ability to complete overhead reaching tasks with less difficulty.     Time  12    Period  Weeks    Status  On-going      OT LONG TERM GOAL #3   Title  Patient will increase RUE strength to 4+/5 to be able to return to normal household lifting tasks.     Time  12    Period  Weeks    Status  On-going      OT LONG TERM GOAL #4   Title  Patient will decrease fascial restrictions to trace amount in RUE in order to increase functional mobility needed for overhead reaching.     Time  12    Period  Weeks    Status  On-going      OT LONG TERM GOAL #5   Title  Patient will report a decrease in pain level of 2/10 or less when completing daily tasks with her RUE.     Time  12    Period  Weeks    Status  On-going            Plan - 06/10/17 1327    Clinical Impression Statement  A:  Patient with moderate fascial restrictions in right upper arm, scapular, and shoulder region this date.  Patient had good vasomotor response to manual therapy intervention.  Patient tolerated P/ROM within parameters of protocol this date, experiencing increased pain with descent from flexion vs ascent.      Plan  P:  Continue skilled OT intervention, per protocol.  Begin serratus anterior punch with arm unweighted, increase contraction time with isometrics to 5 seconds.        Patient will benefit from skilled therapeutic intervention in order to improve the following deficits and impairments:  Decreased range of motion, Pain, Impaired UE functional use, Decreased strength, Increased fascial restrictions  Visit Diagnosis: Other symptoms and signs involving the musculoskeletal system  Acute pain of right shoulder  Stiffness of right shoulder, not elsewhere classified    Problem  List Patient Active Problem List   Diagnosis Date Noted  . Vaginal Pap smear following hysterectomy for malignancy 03/02/2017  . Screening for colorectal cancer 03/02/2017  . Vaginal atrophy 03/02/2017  . History of ovarian cancer 03/02/2017  . Genetic testing 05/29/2015  . Family history of kidney cancer   . Family history of prostate cancer   . Chemotherapy-induced neuropathy ([place]) 03/31/2015  . Incisional hernia, incarcerated 10/15/2014  . Ventral hernia 07/15/2014  . Ovarian cancer on right ([place]) 06/17/2011  . Hypertension 06/17/2011  . H/O hysterectomy with oophorectomy 06/17/2011    Vangie Bicker, MHA, OTR/L [phone]  06/10/2017, 1:33 PM  [facility] [address] [place], [place], [address] Phone:  [phone]   Fax:  [phone]  Name: VERLYN DANNENBERG MRN: [id number] Date of Birth: 1950/01/27

## 2017-06-13 ENCOUNTER — Other Ambulatory Visit: Payer: Self-pay

## 2017-06-13 ENCOUNTER — Encounter (HOSPITAL_COMMUNITY): Payer: Self-pay

## 2017-06-13 ENCOUNTER — Ambulatory Visit (HOSPITAL_COMMUNITY): Payer: Medicare Other

## 2017-06-13 DIAGNOSIS — M25511 Pain in right shoulder: Secondary | ICD-10-CM | POA: Diagnosis not present

## 2017-06-13 DIAGNOSIS — R29898 Other symptoms and signs involving the musculoskeletal system: Secondary | ICD-10-CM

## 2017-06-13 DIAGNOSIS — M25611 Stiffness of right shoulder, not elsewhere classified: Secondary | ICD-10-CM

## 2017-06-14 ENCOUNTER — Encounter (HOSPITAL_COMMUNITY): Payer: Self-pay

## 2017-06-14 NOTE — Therapy (Signed)
[facility] [place], [place], [address] Phone: [phone]   Fax:  [phone]  Occupational Therapy Treatment  Patient Details  Name: Sarah Walker MRN: [id number] Date of Birth: 31-Dec-1950 Referring Provider: Fredonia Highland, MD   Encounter Date: 06/13/2017  OT End of Session - 06/13/17 5170    Visit Number  3    Number of Visits  24    Date for OT Re-Evaluation  08/31/17    Authorization Type  1) [employer] medicare $40 copay 2) [employer] no authorization needed.     OT Start Time  1436    OT Stop Time  1514    OT Time Calculation (min)  38 min    Activity Tolerance  Patient tolerated treatment well    Behavior During Therapy  WFL for tasks assessed/performed       Past Medical History:  Diagnosis Date  . Abdominal hernia   . Allergic rhinitis   . Anemia associated with chemotherapy 09/16/2011  . Bone spur of other site    left foot  . Dry eyes   . Family history of kidney cancer   . Family history of prostate cancer   . Hypertension   . Ovarian cancer ([place])   . Ovarian cancer on right ([place]) 06/17/2011  . Peripheral neuropathy 08/26/2011   Chemotherapy-induced  . Pulmonary embolism ([place])   . Sinusitis     Past Surgical History:  Procedure Laterality Date  . ABDOMINAL HYSTERECTOMY  05/25/2011   [place] bs&o and cancer staging  . CHOLECYSTECTOMY  1990  . COLONOSCOPY N/A 09/06/2014   Procedure: COLONOSCOPY;  Surgeon: Rogene Houston, MD;  [facility];  Service: Endoscopy;  Laterality: N/A;  930 - moved to 9/2 @ 8:25  . INCISIONAL HERNIA REPAIR  10/15/2014  . INCISIONAL HERNIA REPAIR  10/15/2014   Procedure: OPEN INCISIONAL HERNIA REPAIR WITH MESH AND MYOFASCIAL FLAPS;  Surgeon: Erroll Luna, MD;  Location: [facility];  Service: General;;  . IVC filter  05/24/11  . PORT-A-CATH REMOVAL Right 11/21/2013   Procedure: MINOR REMOVAL PORT-A-CATH;  Surgeon: Jamesetta So, MD;  Location: [facility];  Service: General;  Laterality:  Right;  . PORTACATH PLACEMENT  06/16/11   Done at [facility]  . TONSILLECTOMY     age 5's  . TUBAL LIGATION  1981    There were no vitals filed for this visit.  Subjective Assessment - 06/13/17 1439    Subjective   S: I've been doing the pendulum exercises but I wasn't sure if I was doing the other ones right, so I skipped those yesterday.    Currently in Pain?  No/denies Patient reports no pain during the day but does experience pain at night    Pain Onset  --         [facility] OT Assessment - 06/13/17 1441      Assessment   Medical Diagnosis  Right RTC repair       Precautions   Precautions  Shoulder    Type of Shoulder Precautions  See protocol from Dr. Percell Miller    Shoulder Interventions  Shoulder sling/immobilizer;Shoulder abduction pillow;Off for dressing/bathing/exercises               OT Treatments/Exercises (OP) - 06/13/17 1441      Exercises   Exercises  Shoulder      Shoulder Exercises: Supine   Protraction  PROM;5 reps    External Rotation  PROM;5 reps  External Rotation Limitations  to 30    Internal Rotation  PROM;5 reps    Flexion  PROM;5 reps    Flexion Limitations  --    ABduction  PROM;5 reps    ABduction Limitations  to 70    Other Supine Exercises  serratus anterior punch;10x      Shoulder Exercises: Seated   Extension  AROM;10 reps    Row  AROM;10 reps    Diagonals Limitations  shoulder depression for 10 reps    Other Seated Exercises  elbow flexion/extension, supination/pronation, wrist flexion/extension 10 times each A/ROM      Shoulder Exercises: Therapy Ball   Flexion  10 reps    ABduction  10 reps      Shoulder Exercises: Isometric Strengthening   Flexion  -- hold until week 3      Manual Therapy   Manual Therapy  Myofascial release    Manual therapy comments  manual therapy completed seperately from all other interventions this date    Myofascial Release  myofascial release and manual stretching to right upper arm,  scapular, and shoulder region to decrease pain and restrictions and imrpove pain free mobility needed for resumption of right arm as dominant               OT Short Term Goals - 06/10/17 1329      OT SHORT TERM GOAL #1   Title  Patient will be educated and independent with HEP to faciliate progress in therapy and allow her to begin using her RUE for her daily tasks.     Time  6    Period  Weeks    Status  On-going      OT SHORT TERM GOAL #2   Title  Patient will increase P/ROM of RUE to [facility] to increase ability to complete dressing tasks with less difficulty and with independence.     Time  6    Period  Weeks    Status  On-going      OT SHORT TERM GOAL #3   Title  Patient will increase RUE strength to 3+/5 to increase ability to complete waist level household tasks and be able to return to piano playing.     Time  6    Period  Weeks    Status  On-going      OT SHORT TERM GOAL #4   Title  Patient will decrease fascial restrictions to min amount in RUE in order to increase functional mobility needed to for reaching tasks.     Time  6    Period  Weeks    Status  On-going      OT SHORT TERM GOAL #5   Title  Patient will report a pain level score of 4/10 when using RUE during daily tasks.     Time  6    Period  Weeks    Status  On-going        OT Long Term Goals - 06/10/17 1329      OT LONG TERM GOAL #1   Title  Patient will return to highest level of independence while using her RUE as her dominant extremity for all daily and work tasks.     Time  12    Period  Weeks    Status  On-going      OT LONG TERM GOAL #2   Title  Patient will increase A/ROM to WNL to increase ability to complete overhead reaching tasks with  less difficulty.     Time  12    Period  Weeks    Status  On-going      OT LONG TERM GOAL #3   Title  Patient will increase RUE strength to 4+/5 to be able to return to normal household lifting tasks.     Time  12    Period  Weeks    Status   On-going      OT LONG TERM GOAL #4   Title  Patient will decrease fascial restrictions to trace amount in RUE in order to increase functional mobility needed for overhead reaching.     Time  12    Period  Weeks    Status  On-going      OT LONG TERM GOAL #5   Title  Patient will report a decrease in pain level of 2/10 or less when completing daily tasks with her RUE.     Time  12    Period  Weeks    Status  On-going            Plan - 06/13/17 1443    Clinical Impression Statement  A:  Patient presented with moderate fascial restrictions in right upper arm, scapular, and shoulder region this date.  Patient reports no pain during the day, but claims the pain keeps her from getting good sleep at night. Patient continued with gentle shoulder P/ROM and elbow and wrist A/ROM exercises. Serratus anterior shoulder punch added in supine. No isometrics performed this session based on MD protocol.     Plan  P:  Continue skilled OT intervention, per protocol.  Continue with P/ROM. Add inferior glide, anterior glide, and distraction exercises per protocol.       Patient will benefit from skilled therapeutic intervention in order to improve the following deficits and impairments:  Decreased range of motion, Pain, Impaired UE functional use, Decreased strength, Increased fascial restrictions  Visit Diagnosis: Acute pain of right shoulder  Stiffness of right shoulder, not elsewhere classified  Other symptoms and signs involving the musculoskeletal system    Problem List Patient Active Problem List   Diagnosis Date Noted  . Vaginal Pap smear following hysterectomy for malignancy 03/02/2017  . Screening for colorectal cancer 03/02/2017  . Vaginal atrophy 03/02/2017  . History of ovarian cancer 03/02/2017  . Genetic testing 05/29/2015  . Family history of kidney cancer   . Family history of prostate cancer   . Chemotherapy-induced neuropathy ([place]) 03/31/2015  . Incisional hernia,  incarcerated 10/15/2014  . Ventral hernia 07/15/2014  . Ovarian cancer on right ([place]) 06/17/2011  . Hypertension 06/17/2011  . H/O hysterectomy with oophorectomy 06/17/2011   Ailene Ravel, OTR/L,CBIS  [phone]  06/14/2017, 11:33 AM  [facility] [address] [place], [place], [address] Phone: [phone]   Fax:  [phone]  Name: Sarah Walker MRN: [id number] Date of Birth: 10-Oct-1950

## 2017-06-15 ENCOUNTER — Other Ambulatory Visit (HOSPITAL_COMMUNITY): Payer: Self-pay | Admitting: Adult Health

## 2017-06-15 ENCOUNTER — Encounter (HOSPITAL_COMMUNITY): Payer: Medicare Other

## 2017-06-15 DIAGNOSIS — E876 Hypokalemia: Secondary | ICD-10-CM

## 2017-06-15 DIAGNOSIS — T502X5A Adverse effect of carbonic-anhydrase inhibitors, benzothiadiazides and other diuretics, initial encounter: Secondary | ICD-10-CM

## 2017-06-15 DIAGNOSIS — M19011 Primary osteoarthritis, right shoulder: Secondary | ICD-10-CM | POA: Diagnosis not present

## 2017-06-16 ENCOUNTER — Encounter (HOSPITAL_COMMUNITY): Payer: Self-pay | Admitting: Occupational Therapy

## 2017-06-16 ENCOUNTER — Ambulatory Visit (HOSPITAL_COMMUNITY): Payer: Medicare Other | Admitting: Occupational Therapy

## 2017-06-16 DIAGNOSIS — R29898 Other symptoms and signs involving the musculoskeletal system: Secondary | ICD-10-CM | POA: Diagnosis not present

## 2017-06-16 DIAGNOSIS — M25611 Stiffness of right shoulder, not elsewhere classified: Secondary | ICD-10-CM

## 2017-06-16 DIAGNOSIS — M25511 Pain in right shoulder: Secondary | ICD-10-CM | POA: Diagnosis not present

## 2017-06-16 NOTE — Therapy (Signed)
[place], [place], [address] Phone: [phone]   Fax:  [phone]  Occupational Therapy Treatment  Patient Details  Name: Sarah Walker MRN: [id number] Date of Birth: 1950-03-31 Referring Provider: Fredonia Highland, MD   Encounter Date: 06/16/2017  OT End of Session - 06/16/17 0093    Visit Number  4    Number of Visits  24    Date for OT Re-Evaluation  08/31/17    Authorization Type  1) [employer] medicare $40 copay 2) [employer] no authorization needed.     OT Start Time  1347    OT Stop Time  1430    OT Time Calculation (min)  43 min    Activity Tolerance  Patient tolerated treatment well    Behavior During Therapy  WFL for tasks assessed/performed       Past Medical History:  Diagnosis Date  . Abdominal hernia   . Allergic rhinitis   . Anemia associated with chemotherapy 09/16/2011  . Bone spur of other site    left foot  . Dry eyes   . Family history of kidney cancer   . Family history of prostate cancer   . Hypertension   . Ovarian cancer ([place])   . Ovarian cancer on right ([place]) 06/17/2011  . Peripheral neuropathy 08/26/2011   Chemotherapy-induced  . Pulmonary embolism ([place])   . Sinusitis     Past Surgical History:  Procedure Laterality Date  . ABDOMINAL HYSTERECTOMY  05/25/2011   [place] bs&o and cancer staging  . CHOLECYSTECTOMY  1990  . COLONOSCOPY N/A 09/06/2014   Procedure: COLONOSCOPY;  Surgeon: Rogene Houston, MD;  [facility];  Service: Endoscopy;  Laterality: N/A;  930 - moved to 9/2 @ 8:25  . INCISIONAL HERNIA REPAIR  10/15/2014  . INCISIONAL HERNIA REPAIR  10/15/2014   Procedure: OPEN INCISIONAL HERNIA REPAIR WITH MESH AND MYOFASCIAL FLAPS;  Surgeon: Erroll Luna, MD;  Location: [facility];  Service: General;;  . IVC filter  05/24/11  . PORT-A-CATH REMOVAL Right 11/21/2013   Procedure: MINOR REMOVAL PORT-A-CATH;  Surgeon: Jamesetta So, MD;  Location: [facility];  Service: General;  Laterality:  Right;  . PORTACATH PLACEMENT  06/16/11   Done at [facility]  . TONSILLECTOMY     age 1's  . TUBAL LIGATION  1981    There were no vitals filed for this visit.  Subjective Assessment - 06/16/17 1344    Subjective   S: I take my sling off when I'm in the chair sometimes.     Currently in Pain?  No/denies         [facility] OT Assessment - 06/16/17 1344      Assessment   Medical Diagnosis  Right RTC repair       Precautions   Precautions  Shoulder    Type of Shoulder Precautions  See protocol from Dr. Percell Miller    Shoulder Interventions  Shoulder sling/immobilizer;Shoulder abduction pillow;Off for dressing/bathing/exercises               OT Treatments/Exercises (OP) - 06/16/17 1344      Exercises   Exercises  Shoulder      Shoulder Exercises: Supine   Protraction  PROM;5 reps    External Rotation  PROM;5 reps    External Rotation Limitations  to 30    Internal Rotation  PROM;5 reps    Flexion  PROM;5 reps    ABduction  PROM;5 reps  ABduction Limitations  to 70    Other Supine Exercises  serratus anterior punch;10x      Shoulder Exercises: Seated   Extension  AROM;10 reps    Row  AROM;10 reps    Diagonals Limitations  shoulder depression for 10 reps      Shoulder Exercises: Therapy Ball   Flexion  10 reps    ABduction  10 reps      Shoulder Exercises: ROM/Strengthening   Anterior Glide  3X10"    Caudal Glide  3X10"    Other ROM/Strengthening Exercises  shoulder distraction with towel roll, AA/ROM at elbow, 3X10"      Manual Therapy   Manual Therapy  Myofascial release    Manual therapy comments  manual therapy completed seperately from all other interventions this date    Myofascial Release  myofascial release and manual stretching to right upper arm, scapular, and shoulder region to decrease pain and restrictions and imrpove pain free mobility needed for resumption of right arm as dominant               OT Short Term Goals - 06/10/17 1329       OT SHORT TERM GOAL #1   Title  Patient will be educated and independent with HEP to faciliate progress in therapy and allow her to begin using her RUE for her daily tasks.     Time  6    Period  Weeks    Status  On-going      OT SHORT TERM GOAL #2   Title  Patient will increase P/ROM of RUE to [facility] to increase ability to complete dressing tasks with less difficulty and with independence.     Time  6    Period  Weeks    Status  On-going      OT SHORT TERM GOAL #3   Title  Patient will increase RUE strength to 3+/5 to increase ability to complete waist level household tasks and be able to return to piano playing.     Time  6    Period  Weeks    Status  On-going      OT SHORT TERM GOAL #4   Title  Patient will decrease fascial restrictions to min amount in RUE in order to increase functional mobility needed to for reaching tasks.     Time  6    Period  Weeks    Status  On-going      OT SHORT TERM GOAL #5   Title  Patient will report a pain level score of 4/10 when using RUE during daily tasks.     Time  6    Period  Weeks    Status  On-going        OT Long Term Goals - 06/10/17 1329      OT LONG TERM GOAL #1   Title  Patient will return to highest level of independence while using her RUE as her dominant extremity for all daily and work tasks.     Time  12    Period  Weeks    Status  On-going      OT LONG TERM GOAL #2   Title  Patient will increase A/ROM to WNL to increase ability to complete overhead reaching tasks with less difficulty.     Time  12    Period  Weeks    Status  On-going      OT LONG TERM GOAL #3   Title  Patient will increase RUE strength to 4+/5 to be able to return to normal household lifting tasks.     Time  12    Period  Weeks    Status  On-going      OT LONG TERM GOAL #4   Title  Patient will decrease fascial restrictions to trace amount in RUE in order to increase functional mobility needed for overhead reaching.     Time  12    Period   Weeks    Status  On-going      OT LONG TERM GOAL #5   Title  Patient will report a decrease in pain level of 2/10 or less when completing daily tasks with her RUE.     Time  12    Period  Weeks    Status  On-going            Plan - 06/16/17 1437    Clinical Impression Statement  A: Progressed to week II on protocol today, continuing to remain within ROM limitations. Continued with manual therapy to address fascial restrictions causing increased pain and ROM limitations. Added anterior and caudal glides, as well as distraction this session. Verbal cuing for form and technique. Pt reports HEP is going well.     Plan  P: Continue with protocol, increase scapular A/ROM repetitions and therapy ball repetitions to 12 or 15       Patient will benefit from skilled therapeutic intervention in order to improve the following deficits and impairments:  Decreased range of motion, Pain, Impaired UE functional use, Decreased strength, Increased fascial restrictions  Visit Diagnosis: Acute pain of right shoulder  Stiffness of right shoulder, not elsewhere classified  Other symptoms and signs involving the musculoskeletal system    Problem List Patient Active Problem List   Diagnosis Date Noted  . Vaginal Pap smear following hysterectomy for malignancy 03/02/2017  . Screening for colorectal cancer 03/02/2017  . Vaginal atrophy 03/02/2017  . History of ovarian cancer 03/02/2017  . Genetic testing 05/29/2015  . Family history of kidney cancer   . Family history of prostate cancer   . Chemotherapy-induced neuropathy ([place]) 03/31/2015  . Incisional hernia, incarcerated 10/15/2014  . Ventral hernia 07/15/2014  . Ovarian cancer on right ([place]) 06/17/2011  . Hypertension 06/17/2011  . H/O hysterectomy with oophorectomy 06/17/2011   Guadelupe Sabin, OTR/L  [phone] 06/16/2017, 2:53 PM  [facility] [address] [place], [place],  [address] Phone: [phone]   Fax:  [phone]  Name: Sarah Walker MRN: [id number] Date of Birth: 07/05/1950

## 2017-06-20 ENCOUNTER — Encounter (HOSPITAL_COMMUNITY): Payer: Self-pay

## 2017-06-20 ENCOUNTER — Other Ambulatory Visit: Payer: Self-pay

## 2017-06-20 ENCOUNTER — Ambulatory Visit (HOSPITAL_COMMUNITY): Payer: Medicare Other

## 2017-06-20 DIAGNOSIS — M25511 Pain in right shoulder: Secondary | ICD-10-CM | POA: Diagnosis not present

## 2017-06-20 DIAGNOSIS — R29898 Other symptoms and signs involving the musculoskeletal system: Secondary | ICD-10-CM | POA: Diagnosis not present

## 2017-06-20 DIAGNOSIS — M25611 Stiffness of right shoulder, not elsewhere classified: Secondary | ICD-10-CM

## 2017-06-20 NOTE — Therapy (Signed)
[place], [place], [address] Phone: [phone]   Fax:  [phone]  Occupational Therapy Treatment  Patient Details  Name: Sarah Walker MRN: [id number] Date of Birth: 05/19/1950 Referring Provider: Fredonia Highland, MD   Encounter Date: 06/20/2017  OT End of Session - 06/20/17 1443    Visit Number  5    Number of Visits  24    Date for OT Re-Evaluation  08/31/17    Authorization Type  1) [employer] medicare $40 copay 2) [employer] no authorization needed.     OT Start Time  1437    OT Stop Time  1515    OT Time Calculation (min)  38 min    Activity Tolerance  Patient tolerated treatment well    Behavior During Therapy  WFL for tasks assessed/performed       Past Medical History:  Diagnosis Date  . Abdominal hernia   . Allergic rhinitis   . Anemia associated with chemotherapy 09/16/2011  . Bone spur of other site    left foot  . Dry eyes   . Family history of kidney cancer   . Family history of prostate cancer   . Hypertension   . Ovarian cancer ([place])   . Ovarian cancer on right ([place]) 06/17/2011  . Peripheral neuropathy 08/26/2011   Chemotherapy-induced  . Pulmonary embolism ([place])   . Sinusitis     Past Surgical History:  Procedure Laterality Date  . ABDOMINAL HYSTERECTOMY  05/25/2011   [place] bs&o and cancer staging  . CHOLECYSTECTOMY  1990  . COLONOSCOPY N/A 09/06/2014   Procedure: COLONOSCOPY;  Surgeon: Rogene Houston, MD;  [facility];  Service: Endoscopy;  Laterality: N/A;  930 - moved to 9/2 @ 8:25  . INCISIONAL HERNIA REPAIR  10/15/2014  . INCISIONAL HERNIA REPAIR  10/15/2014   Procedure: OPEN INCISIONAL HERNIA REPAIR WITH MESH AND MYOFASCIAL FLAPS;  Surgeon: Erroll Luna, MD;  Location: [place];  Service: General;;  . IVC filter  05/24/11  . PORT-A-CATH REMOVAL Right 11/21/2013   Procedure: MINOR REMOVAL PORT-A-CATH;  Surgeon: Jamesetta So, MD;  Location: [facility];  Service: General;  Laterality:  Right;  . PORTACATH PLACEMENT  06/16/11   Done at [facility]  . TONSILLECTOMY     age 42's  . TUBAL LIGATION  1981    There were no vitals filed for this visit.  Subjective Assessment - 06/20/17 1440    Subjective   S: I only have pain if I do something I'm not supposed to.    Currently in Pain?  No/denies         [facility] OT Assessment - 06/20/17 1440      Assessment   Medical Diagnosis  Right RTC repair       Precautions   Precautions  Shoulder    Type of Shoulder Precautions  See protocol from Dr. Percell Miller    Shoulder Interventions  Shoulder sling/immobilizer;Shoulder abduction pillow;Off for dressing/bathing/exercises               OT Treatments/Exercises (OP) - 06/20/17 1441      Exercises   Exercises  Shoulder      Shoulder Exercises: Supine   Protraction  PROM;5 reps    Horizontal ABduction  --    External Rotation  PROM;5 reps    External Rotation Limitations  to 30    Internal Rotation  PROM;5 reps    Flexion  PROM;5 reps  ABduction  PROM;5 reps    ABduction Limitations  to 70    Other Supine Exercises  serratus anterior punch;12X     Shoulder Exercises: Seated   Elevation  AROM;12 reps    Extension  AROM;12 reps    Row  AROM;12 reps    Diagonals Limitations  shoulder depression for 12 reps    Other Seated Exercises  elbow flexion/extension, supination/pronation, wrist flexion/extension 12 times each A/ROM      Shoulder Exercises: Therapy Ball   Flexion  -- 12X   ABduction  -- 12X     Manual Therapy   Manual Therapy  Myofascial release    Manual therapy comments  manual therapy completed seperately from all other interventions this date    Myofascial Release  myofascial release and manual stretching to right upper arm, scapular, and shoulder region to decrease pain and restrictions and imrpove pain free mobility needed for resumption of right arm as dominant               OT Short Term Goals - 06/10/17 1329      OT SHORT TERM  GOAL #1   Title  Patient will be educated and independent with HEP to faciliate progress in therapy and allow her to begin using her RUE for her daily tasks.     Time  6    Period  Weeks    Status  On-going      OT SHORT TERM GOAL #2   Title  Patient will increase P/ROM of RUE to [facility] to increase ability to complete dressing tasks with less difficulty and with independence.     Time  6    Period  Weeks    Status  On-going      OT SHORT TERM GOAL #3   Title  Patient will increase RUE strength to 3+/5 to increase ability to complete waist level household tasks and be able to return to piano playing.     Time  6    Period  Weeks    Status  On-going      OT SHORT TERM GOAL #4   Title  Patient will decrease fascial restrictions to min amount in RUE in order to increase functional mobility needed to for reaching tasks.     Time  6    Period  Weeks    Status  On-going      OT SHORT TERM GOAL #5   Title  Patient will report a pain level score of 4/10 when using RUE during daily tasks.     Time  6    Period  Weeks    Status  On-going        OT Long Term Goals - 06/10/17 1329      OT LONG TERM GOAL #1   Title  Patient will return to highest level of independence while using her RUE as her dominant extremity for all daily and work tasks.     Time  12    Period  Weeks    Status  On-going      OT LONG TERM GOAL #2   Title  Patient will increase A/ROM to WNL to increase ability to complete overhead reaching tasks with less difficulty.     Time  12    Period  Weeks    Status  On-going      OT LONG TERM GOAL #3   Title  Patient will increase RUE strength to 4+/5 to be able  to return to normal household lifting tasks.     Time  12    Period  Weeks    Status  On-going      OT LONG TERM GOAL #4   Title  Patient will decrease fascial restrictions to trace amount in RUE in order to increase functional mobility needed for overhead reaching.     Time  12    Period  Weeks    Status   On-going      OT LONG TERM GOAL #5   Title  Patient will report a decrease in pain level of 2/10 or less when completing daily tasks with her RUE.     Time  12    Period  Weeks    Status  On-going            Plan - 06/20/17 1450    Clinical Impression Statement  A: Continued to remain within ROM limitations this session. Continued with manual therapy to address fascial restrictions causing ROM limitations. Decreased amount of fascial restrictions palpated. Patient able to increase reps for scapular A/ROM as well as for therapy ball exercises with no pain. Min VC for form and technique. Patient reports compliance with HEP and is motivated to regain function in the RUE.     Plan  P: Continue to progress with week 2 of protocol increasing repetitions as tolerated for scapular exercises.  May progress to week 3 one day early if patient able to tolerate.       Patient will benefit from skilled therapeutic intervention in order to improve the following deficits and impairments:  Decreased range of motion, Pain, Impaired UE functional use, Decreased strength, Increased fascial restrictions  Visit Diagnosis: Acute pain of right shoulder  Stiffness of right shoulder, not elsewhere classified  Other symptoms and signs involving the musculoskeletal system    Problem List Patient Active Problem List   Diagnosis Date Noted  . Vaginal Pap smear following hysterectomy for malignancy 03/02/2017  . Screening for colorectal cancer 03/02/2017  . Vaginal atrophy 03/02/2017  . History of ovarian cancer 03/02/2017  . Genetic testing 05/29/2015  . Family history of kidney cancer   . Family history of prostate cancer   . Chemotherapy-induced neuropathy ([employer]) 03/31/2015  . Incisional hernia, incarcerated 10/15/2014  . Ventral hernia 07/15/2014  . Ovarian cancer on right ([place]) 06/17/2011  . Hypertension 06/17/2011  . H/O hysterectomy with oophorectomy 06/17/2011   Ailene Ravel, OTR/L,CBIS   [phone]  06/20/2017, 3:09 PM  [facility] [address] [place], [place], [address] Phone: [phone]   Fax:  [phone]  Name: Sarah Walker MRN: [id number] Date of Birth: 05/07/1950

## 2017-06-22 ENCOUNTER — Encounter (HOSPITAL_COMMUNITY): Payer: Self-pay | Admitting: Occupational Therapy

## 2017-06-22 ENCOUNTER — Ambulatory Visit (HOSPITAL_COMMUNITY): Payer: Medicare Other | Admitting: Occupational Therapy

## 2017-06-22 DIAGNOSIS — R29898 Other symptoms and signs involving the musculoskeletal system: Secondary | ICD-10-CM | POA: Diagnosis not present

## 2017-06-22 DIAGNOSIS — M25611 Stiffness of right shoulder, not elsewhere classified: Secondary | ICD-10-CM

## 2017-06-22 DIAGNOSIS — M25511 Pain in right shoulder: Secondary | ICD-10-CM | POA: Diagnosis not present

## 2017-06-22 NOTE — Patient Instructions (Signed)
1) Seated Row   Sit up straight with elbows by your sides. Pull back with shoulders/elbows, keeping forearms straight, as if pulling back on the reins of a horse. Squeeze shoulder blades together. Repeat _15__times, _2-3___sets/day    2) Shoulder Elevation    Sit up straight with arms by your sides. Slowly bring your shoulders up towards your ears. Repeat_15__times, __2-3__ sets/day    3) Shoulder Extension    Sit up straight with both arms by your side, draw your arms back behind your waist. Keep your elbows straight. Repeat _15___times, __2-3__sets/day.

## 2017-06-22 NOTE — Therapy (Signed)
[place], [place], [address] Phone: [phone]   Fax:  [phone]  Occupational Therapy Treatment  Patient Details  Name: Sarah Walker MRN: [id number] Date of Birth: 1950-07-19 Referring Provider: Fredonia Highland, MD   Encounter Date: 06/22/2017  OT End of Session - 06/22/17 1508    Visit Number  6    Number of Visits  24    Date for OT Re-Evaluation  08/31/17    Authorization Type  1) [employer] medicare $40 copay 2) [employer] no authorization needed.     OT Start Time  1428    OT Stop Time  1508    OT Time Calculation (min)  40 min    Activity Tolerance  Patient tolerated treatment well    Behavior During Therapy  WFL for tasks assessed/performed       Past Medical History:  Diagnosis Date  . Abdominal hernia   . Allergic rhinitis   . Anemia associated with chemotherapy 09/16/2011  . Bone spur of other site    left foot  . Dry eyes   . Family history of kidney cancer   . Family history of prostate cancer   . Hypertension   . Ovarian cancer ([place])   . Ovarian cancer on right ([place]) 06/17/2011  . Peripheral neuropathy 08/26/2011   Chemotherapy-induced  . Pulmonary embolism ([place])   . Sinusitis     Past Surgical History:  Procedure Laterality Date  . ABDOMINAL HYSTERECTOMY  05/25/2011   [place] bs&o and cancer staging  . CHOLECYSTECTOMY  1990  . COLONOSCOPY N/A 09/06/2014   Procedure: COLONOSCOPY;  Surgeon: Rogene Houston, MD;  [facility];  Service: Endoscopy;  Laterality: N/A;  930 - moved to 9/2 @ 8:25  . INCISIONAL HERNIA REPAIR  10/15/2014  . INCISIONAL HERNIA REPAIR  10/15/2014   Procedure: OPEN INCISIONAL HERNIA REPAIR WITH MESH AND MYOFASCIAL FLAPS;  Surgeon: Erroll Luna, MD;  Location: [facility];  Service: General;;  . IVC filter  05/24/11  . PORT-A-CATH REMOVAL Right 11/21/2013   Procedure: MINOR REMOVAL PORT-A-CATH;  Surgeon: Jamesetta So, MD;  Location: [facility];  Service: General;  Laterality:  Right;  . PORTACATH PLACEMENT  06/16/11   Done at [facility]  . TONSILLECTOMY     age 67's  . TUBAL LIGATION  1981    There were no vitals filed for this visit.  Subjective Assessment - 06/22/17 1427    Subjective   S: I can pull my underwear up now.     Currently in Pain?  No/denies         [facility] OT Assessment - 06/22/17 1427      Assessment   Medical Diagnosis  Right RTC repair       Precautions   Precautions  Shoulder    Type of Shoulder Precautions  See protocol from Dr. Percell Miller    Shoulder Interventions  Shoulder sling/immobilizer;Shoulder abduction pillow;Off for dressing/bathing/exercises               OT Treatments/Exercises (OP) - 06/22/17 1449      Exercises   Exercises  Shoulder      Shoulder Exercises: Supine   Protraction  PROM;5 reps    Horizontal ABduction  PROM;5 reps    External Rotation  PROM;5 reps    External Rotation Limitations  to 45    Internal Rotation  PROM;5 reps    Flexion  PROM;5 reps    ABduction  PROM;5 reps    ABduction Limitations  to 90      Shoulder Exercises: Seated   Elevation  AROM;15 reps    Extension  AROM;15 reps    Row  AROM;15 reps    Diagonals Limitations  shoulder depression for 15 reps      Shoulder Exercises: Therapy Ball   Flexion  15 reps    ABduction  15 reps      Shoulder Exercises: ROM/Strengthening   Anterior Glide  3X10"    Caudal Glide  3X10"      Shoulder Exercises: Isometric Strengthening   Flexion  Supine;3X5"    Extension  Supine;3X5"    External Rotation  Supine;3X5"    Internal Rotation  Supine;3X5"    ABduction  Supine;3X5"    ADduction  Supine;3X5"      Manual Therapy   Manual Therapy  Myofascial release    Manual therapy comments  manual therapy completed seperately from all other interventions this date    Myofascial Release  myofascial release and manual stretching to right upper arm, scapular, and shoulder region to decrease pain and restrictions and imrpove pain free  mobility needed for resumption of right arm as dominant             OT Education - 06/22/17 1502    Education Details  scapular A/ROM    Person(s) Educated  Patient    Methods  Explanation;Demonstration;Handout    Comprehension  Verbalized understanding;Returned demonstration       OT Short Term Goals - 06/10/17 1329      OT SHORT TERM GOAL #1   Title  Patient will be educated and independent with HEP to faciliate progress in therapy and allow her to begin using her RUE for her daily tasks.     Time  6    Period  Weeks    Status  On-going      OT SHORT TERM GOAL #2   Title  Patient will increase P/ROM of RUE to [facility] to increase ability to complete dressing tasks with less difficulty and with independence.     Time  6    Period  Weeks    Status  On-going      OT SHORT TERM GOAL #3   Title  Patient will increase RUE strength to 3+/5 to increase ability to complete waist level household tasks and be able to return to piano playing.     Time  6    Period  Weeks    Status  On-going      OT SHORT TERM GOAL #4   Title  Patient will decrease fascial restrictions to min amount in RUE in order to increase functional mobility needed to for reaching tasks.     Time  6    Period  Weeks    Status  On-going      OT SHORT TERM GOAL #5   Title  Patient will report a pain level score of 4/10 when using RUE during daily tasks.     Time  6    Period  Weeks    Status  On-going        OT Long Term Goals - 06/10/17 1329      OT LONG TERM GOAL #1   Title  Patient will return to highest level of independence while using her RUE as her dominant extremity for all daily and work tasks.     Time  12    Period  Weeks  Status  On-going      OT LONG TERM GOAL #2   Title  Patient will increase A/ROM to WNL to increase ability to complete overhead reaching tasks with less difficulty.     Time  12    Period  Weeks    Status  On-going      OT LONG TERM GOAL #3   Title  Patient will  increase RUE strength to 4+/5 to be able to return to normal household lifting tasks.     Time  12    Period  Weeks    Status  On-going      OT LONG TERM GOAL #4   Title  Patient will decrease fascial restrictions to trace amount in RUE in order to increase functional mobility needed for overhead reaching.     Time  12    Period  Weeks    Status  On-going      OT LONG TERM GOAL #5   Title  Patient will report a decrease in pain level of 2/10 or less when completing daily tasks with her RUE.     Time  12    Period  Weeks    Status  On-going            Plan - 06/22/17 1509    Clinical Impression Statement  A: Progressed to phase 3 of protocol for ROM limitations as pt has great joint mobility and no pain with P/ROM. Continued with manual therapy to address fascial restrictions in deltoid and scapular regions causing increased pain and limiting ROM. Added isometrics in supine, also increased scapular ROM and therapy ball exercises to 15 repetitions each. Verbal cuing for form and technique during exercises.     Plan  P: Progress to phase 3 adding supine AA/ROM       Patient will benefit from skilled therapeutic intervention in order to improve the following deficits and impairments:  Decreased range of motion, Pain, Impaired UE functional use, Decreased strength, Increased fascial restrictions  Visit Diagnosis: Acute pain of right shoulder  Stiffness of right shoulder, not elsewhere classified  Other symptoms and signs involving the musculoskeletal system    Problem List Patient Active Problem List   Diagnosis Date Noted  . Vaginal Pap smear following hysterectomy for malignancy 03/02/2017  . Screening for colorectal cancer 03/02/2017  . Vaginal atrophy 03/02/2017  . History of ovarian cancer 03/02/2017  . Genetic testing 05/29/2015  . Family history of kidney cancer   . Family history of prostate cancer   . Chemotherapy-induced neuropathy ([place]) 03/31/2015  .  Incisional hernia, incarcerated 10/15/2014  . Ventral hernia 07/15/2014  . Ovarian cancer on right ([place]) 06/17/2011  . Hypertension 06/17/2011  . H/O hysterectomy with oophorectomy 06/17/2011   Guadelupe Sabin, OTR/L  [phone] 06/22/2017, 3:11 PM  [facility] [address] [place], [place], [address] Phone: [phone]   Fax:  [phone]  Name: BAYLIE DRAKES MRN: [id number] Date of Birth: 1950-10-14

## 2017-06-27 ENCOUNTER — Ambulatory Visit (HOSPITAL_COMMUNITY): Payer: Medicare Other

## 2017-06-27 ENCOUNTER — Other Ambulatory Visit: Payer: Self-pay

## 2017-06-27 ENCOUNTER — Encounter (HOSPITAL_COMMUNITY): Payer: Self-pay

## 2017-06-27 DIAGNOSIS — M25511 Pain in right shoulder: Secondary | ICD-10-CM | POA: Diagnosis not present

## 2017-06-27 DIAGNOSIS — R29898 Other symptoms and signs involving the musculoskeletal system: Secondary | ICD-10-CM | POA: Diagnosis not present

## 2017-06-27 DIAGNOSIS — M25611 Stiffness of right shoulder, not elsewhere classified: Secondary | ICD-10-CM | POA: Diagnosis not present

## 2017-06-27 NOTE — Therapy (Signed)
[place], [place], [address] Phone: [phone]   Fax:  [phone]  Occupational Therapy Treatment  Patient Details  Name: Sarah Walker MRN: [id number] Date of Birth: 10-16-1950 Referring Provider: Fredonia Highland, MD   Encounter Date: 06/27/2017  OT End of Session - 06/27/17 1439    Visit Number  7    Number of Visits  24    Date for OT Re-Evaluation  08/31/17    Authorization Type  1) [employer] medicare $40 copay 2) [employer] no authorization needed.     OT Start Time  1430    OT Stop Time  1513    OT Time Calculation (min)  43 min    Activity Tolerance  Patient tolerated treatment well    Behavior During Therapy  WFL for tasks assessed/performed       Past Medical History:  Diagnosis Date  . Abdominal hernia   . Allergic rhinitis   . Anemia associated with chemotherapy 09/16/2011  . Bone spur of other site    left foot  . Dry eyes   . Family history of kidney cancer   . Family history of prostate cancer   . Hypertension   . Ovarian cancer ([place])   . Ovarian cancer on right ([place]) 06/17/2011  . Peripheral neuropathy 08/26/2011   Chemotherapy-induced  . Pulmonary embolism ([place])   . Sinusitis     Past Surgical History:  Procedure Laterality Date  . ABDOMINAL HYSTERECTOMY  05/25/2011   [place] bs&o and cancer staging  . CHOLECYSTECTOMY  1990  . COLONOSCOPY N/A 09/06/2014   Procedure: COLONOSCOPY;  Surgeon: Rogene Houston, MD;  [facility];  Service: Endoscopy;  Laterality: N/A;  930 - moved to 9/2 @ 8:25  . INCISIONAL HERNIA REPAIR  10/15/2014  . INCISIONAL HERNIA REPAIR  10/15/2014   Procedure: OPEN INCISIONAL HERNIA REPAIR WITH MESH AND MYOFASCIAL FLAPS;  Surgeon: Erroll Luna, MD;  Location: [facility];  Service: General;;  . IVC filter  05/24/11  . PORT-A-CATH REMOVAL Right 11/21/2013   Procedure: MINOR REMOVAL PORT-A-CATH;  Surgeon: Jamesetta So, MD;  [facility]: [facility];  Service: General;  Laterality:  Right;  . PORTACATH PLACEMENT  06/16/11   Done at [facility]  . TONSILLECTOMY     age 67's  . TUBAL LIGATION  1981    There were no vitals filed for this visit.  Subjective Assessment - 06/27/17 1432    Subjective   S: When I do things I am not supposed to do, it warns me and I feel a sharp pain.    Currently in Pain?  No/denies         [facility] OT Assessment - 06/27/17 1433      Assessment   Medical Diagnosis  Right RTC repair       Precautions   Precautions  Shoulder    Type of Shoulder Precautions  See protocol from Dr. Percell Miller    Shoulder Interventions  Shoulder sling/immobilizer;Shoulder abduction pillow;Off for dressing/bathing/exercises               OT Treatments/Exercises (OP) - 06/27/17 1434      Exercises   Exercises  Shoulder      Shoulder Exercises: Supine   Protraction  PROM;5 reps;AAROM;10 reps    Horizontal ABduction  PROM;5 reps;AAROM;10 reps    External Rotation  PROM;5 reps;AAROM;10 reps    External Rotation Limitations  to 45    Internal Rotation  PROM;5 reps;AAROM;10 reps    Flexion  PROM;5 reps;AAROM;10 reps    ABduction  PROM;5 reps;AAROM;10 reps    ABduction Limitations  to 90    Other Supine Exercises  serratus anterior punch; 15x      Shoulder Exercises: Seated   Elevation  AROM;15 reps    Extension  AROM;15 reps    Row  AROM;15 reps    Diagonals Limitations  shoulder depression for 15 reps    Other Seated Exercises  elbow flexion/extension, supination/pronation, wrist flexion/extension 15 times each A/ROM      Shoulder Exercises: Therapy Ball   Flexion  15 reps    ABduction  15 reps limited to 90 per protocol      Shoulder Exercises: Isometric Strengthening   Flexion  Supine;5X5"    Extension  Supine;5X5"    External Rotation  Supine;5X5"    Internal Rotation  Supine;5X5"    ABduction  Supine;5X5"    ADduction  Supine;5X5"      Manual Therapy   Manual Therapy  Myofascial release    Manual therapy comments  manual  therapy completed seperately from all other interventions this date    Myofascial Release  myofascial release and manual stretching to right upper arm, scapular, and shoulder region to decrease pain and restrictions and imrpove pain free mobility needed for resumption of right arm as dominant               OT Short Term Goals - 06/10/17 1329      OT SHORT TERM GOAL #1   Title  Patient will be educated and independent with HEP to faciliate progress in therapy and allow her to begin using her RUE for her daily tasks.     Time  6    Period  Weeks    Status  On-going      OT SHORT TERM GOAL #2   Title  Patient will increase P/ROM of RUE to [facility] to increase ability to complete dressing tasks with less difficulty and with independence.     Time  6    Period  Weeks    Status  On-going      OT SHORT TERM GOAL #3   Title  Patient will increase RUE strength to 3+/5 to increase ability to complete waist level household tasks and be able to return to piano playing.     Time  6    Period  Weeks    Status  On-going      OT SHORT TERM GOAL #4   Title  Patient will decrease fascial restrictions to min amount in RUE in order to increase functional mobility needed to for reaching tasks.     Time  6    Period  Weeks    Status  On-going      OT SHORT TERM GOAL #5   Title  Patient will report a pain level score of 4/10 when using RUE during daily tasks.     Time  6    Period  Weeks    Status  On-going        OT Long Term Goals - 06/10/17 1329      OT LONG TERM GOAL #1   Title  Patient will return to highest level of independence while using her RUE as her dominant extremity for all daily and work tasks.     Time  12    Period  Weeks    Status  On-going  OT LONG TERM GOAL #2   Title  Patient will increase A/ROM to WNL to increase ability to complete overhead reaching tasks with less difficulty.     Time  12    Period  Weeks    Status  On-going      OT LONG TERM GOAL #3    Title  Patient will increase RUE strength to 4+/5 to be able to return to normal household lifting tasks.     Time  12    Period  Weeks    Status  On-going      OT LONG TERM GOAL #4   Title  Patient will decrease fascial restrictions to trace amount in RUE in order to increase functional mobility needed for overhead reaching.     Time  12    Period  Weeks    Status  On-going      OT LONG TERM GOAL #5   Title  Patient will report a decrease in pain level of 2/10 or less when completing daily tasks with her RUE.     Time  12    Period  Weeks    Status  On-going            Plan - 06/27/17 1440    Clinical Impression Statement  A: Minimal fascial restrictions palpated this session. Continued with phase 3 of the protocol adding AA/ROM in supine. Moderate VC required for form and technique. Patient able to increase reps for isometric exercises. Patient reports no pain during exercises with the exception of mild pain at end range of flexion. Patient eager to return to previous level and performed well with exercises.    Plan  P: Continue with manual techniques to address fascial restrictions in upper arm and upper trapezius. Progress to week 4 of protocol adding AA/ROM in seated/standing as well as weighted serratus anterior punch and weighted standing shoulder shrugs. Add yellow band exercises for IR/er, row, and retraction.    Consulted and Agree with Plan of Care  Patient       Patient will benefit from skilled therapeutic intervention in order to improve the following deficits and impairments:  Decreased range of motion, Pain, Impaired UE functional use, Decreased strength, Increased fascial restrictions  Visit Diagnosis: Acute pain of right shoulder  Stiffness of right shoulder, not elsewhere classified  Other symptoms and signs involving the musculoskeletal system    Problem List Patient Active Problem List   Diagnosis Date Noted  . Vaginal Pap smear following hysterectomy  for malignancy 03/02/2017  . Screening for colorectal cancer 03/02/2017  . Vaginal atrophy 03/02/2017  . History of ovarian cancer 03/02/2017  . Genetic testing 05/29/2015  . Family history of kidney cancer   . Family history of prostate cancer   . Chemotherapy-induced neuropathy ([place]) 03/31/2015  . Incisional hernia, incarcerated 10/15/2014  . Ventral hernia 07/15/2014  . Ovarian cancer on right ([place]) 06/17/2011  . Hypertension 06/17/2011  . H/O hysterectomy with oophorectomy 06/17/2011    Ailene Ravel, OTR/L,CBIS  [phone]   06/27/2017, 3:15 PM  [facility] [address] [place], [place], [address] Phone: [phone]   Fax:  [phone]  Name: SHANITRA PHILLIPPI MRN: [id number] Date of Birth: 06/02/1950

## 2017-06-29 ENCOUNTER — Ambulatory Visit (HOSPITAL_COMMUNITY): Payer: Medicare Other

## 2017-06-29 ENCOUNTER — Encounter (HOSPITAL_COMMUNITY): Payer: Self-pay

## 2017-06-29 ENCOUNTER — Other Ambulatory Visit: Payer: Self-pay

## 2017-06-29 DIAGNOSIS — M25511 Pain in right shoulder: Secondary | ICD-10-CM | POA: Diagnosis not present

## 2017-06-29 DIAGNOSIS — R29898 Other symptoms and signs involving the musculoskeletal system: Secondary | ICD-10-CM

## 2017-06-29 DIAGNOSIS — M25611 Stiffness of right shoulder, not elsewhere classified: Secondary | ICD-10-CM

## 2017-06-29 NOTE — Therapy (Signed)
[place], [place], [address] Phone: [phone]   Fax:  [phone]  Occupational Therapy Treatment  Patient Details  Name: Sarah Walker MRN: [id number] Date of Birth: 14-Oct-1950 Referring Provider: Fredonia Highland, MD   Encounter Date: 06/29/2017  OT End of Session - 06/29/17 1502    Visit Number  8    Number of Visits  24    Date for OT Re-Evaluation  08/31/17    Authorization Type  1) [employer] medicare $40 copay 2) [employer] no authorization needed.     OT Start Time  1435    OT Stop Time  1514    OT Time Calculation (min)  39 min    Activity Tolerance  Patient tolerated treatment well    Behavior During Therapy  WFL for tasks assessed/performed       Past Medical History:  Diagnosis Date  . Abdominal hernia   . Allergic rhinitis   . Anemia associated with chemotherapy 09/16/2011  . Bone spur of other site    left foot  . Dry eyes   . Family history of kidney cancer   . Family history of prostate cancer   . Hypertension   . Ovarian cancer ([place])   . Ovarian cancer on right ([place]) 06/17/2011  . Peripheral neuropathy 08/26/2011   Chemotherapy-induced  . Pulmonary embolism ([place])   . Sinusitis     Past Surgical History:  Procedure Laterality Date  . ABDOMINAL HYSTERECTOMY  05/25/2011   [place] bs&o and cancer staging  . CHOLECYSTECTOMY  1990  . COLONOSCOPY N/A 09/06/2014   Procedure: COLONOSCOPY;  Surgeon: Rogene Houston, MD;  [facility];  Service: Endoscopy;  Laterality: N/A;  930 - moved to 9/2 @ 8:25  . INCISIONAL HERNIA REPAIR  10/15/2014  . INCISIONAL HERNIA REPAIR  10/15/2014   Procedure: OPEN INCISIONAL HERNIA REPAIR WITH MESH AND MYOFASCIAL FLAPS;  Surgeon: Erroll Luna, MD;  Location: [place];  Service: General;;  . IVC filter  05/24/11  . PORT-A-CATH REMOVAL Right 11/21/2013   Procedure: MINOR REMOVAL PORT-A-CATH;  Surgeon: Jamesetta So, MD;  Location: [facility];  Service: General;  Laterality:  Right;  . PORTACATH PLACEMENT  06/16/11   Done at [facility]  . TONSILLECTOMY     age 67's  . TUBAL LIGATION  1981    There were no vitals filed for this visit.  Subjective Assessment - 06/29/17 1502    Subjective   S: It's feeling pretty good today.    Currently in Pain?  No/denies         [facility] OT Assessment - 06/29/17 1434      Assessment   Medical Diagnosis  Right RTC repair       Precautions   Precautions  Shoulder    Type of Shoulder Precautions  See protocol from Dr. Percell Miller    Shoulder Interventions  Shoulder sling/immobilizer;Shoulder abduction pillow;Off for dressing/bathing/exercises               OT Treatments/Exercises (OP) - 06/29/17 1436      Exercises   Exercises  Shoulder      Shoulder Exercises: Supine   Protraction  PROM;5 reps;AAROM;10 reps    Horizontal ABduction  PROM;5 reps;AAROM;10 reps    External Rotation  PROM;5 reps;AAROM;10 reps    External Rotation Limitations  no limit with arm adducted    Internal Rotation  PROM;5 reps;AAROM;10 reps    Flexion  PROM;5 reps;AAROM;10 reps  ABduction  PROM;5 reps;AAROM;10 reps    Other Supine Exercises  serratus anterior punch; 15x      Shoulder Exercises: Seated   Diagonals Limitations  shoulder depression for 10 reps with 1 # weight      Shoulder Exercises: Standing   Protraction  AAROM;10 reps    Horizontal ABduction  AAROM;10 reps    External Rotation  AAROM;10 reps    Internal Rotation  AAROM;10 reps    Flexion  AAROM;10 reps    ABduction  AAROM;10 reps      Manual Therapy   Manual Therapy  Myofascial release    Manual therapy comments  manual therapy completed seperately from all other interventions this date    Myofascial Release  myofascial release and manual stretching to right upper arm, scapular, and shoulder region to decrease pain and restrictions and imrpove pain free mobility needed for resumption of right arm as dominant             OT Education - 06/29/17  1501    Education Details  HEP updated to include AA/ROM exercises.    Person(s) Educated  Patient    Methods  Explanation;Demonstration;Handout    Comprehension  Verbalized understanding;Returned demonstration       OT Short Term Goals - 06/10/17 1329      OT SHORT TERM GOAL #1   Title  Patient will be educated and independent with HEP to faciliate progress in therapy and allow her to begin using her RUE for her daily tasks.     Time  6    Period  Weeks    Status  On-going      OT SHORT TERM GOAL #2   Title  Patient will increase P/ROM of RUE to [facility] to increase ability to complete dressing tasks with less difficulty and with independence.     Time  6    Period  Weeks    Status  On-going      OT SHORT TERM GOAL #3   Title  Patient will increase RUE strength to 3+/5 to increase ability to complete waist level household tasks and be able to return to piano playing.     Time  6    Period  Weeks    Status  On-going      OT SHORT TERM GOAL #4   Title  Patient will decrease fascial restrictions to min amount in RUE in order to increase functional mobility needed to for reaching tasks.     Time  6    Period  Weeks    Status  On-going      OT SHORT TERM GOAL #5   Title  Patient will report a pain level score of 4/10 when using RUE during daily tasks.     Time  6    Period  Weeks    Status  On-going        OT Long Term Goals - 06/10/17 1329      OT LONG TERM GOAL #1   Title  Patient will return to highest level of independence while using her RUE as her dominant extremity for all daily and work tasks.     Time  12    Period  Weeks    Status  On-going      OT LONG TERM GOAL #2   Title  Patient will increase A/ROM to WNL to increase ability to complete overhead reaching tasks with less difficulty.     Time  12  Period  Weeks    Status  On-going      OT LONG TERM GOAL #3   Title  Patient will increase RUE strength to 4+/5 to be able to return to normal household  lifting tasks.     Time  12    Period  Weeks    Status  On-going      OT LONG TERM GOAL #4   Title  Patient will decrease fascial restrictions to trace amount in RUE in order to increase functional mobility needed for overhead reaching.     Time  12    Period  Weeks    Status  On-going      OT LONG TERM GOAL #5   Title  Patient will report a decrease in pain level of 2/10 or less when completing daily tasks with her RUE.     Time  12    Period  Weeks    Status  On-going            Plan - 06/29/17 1509    Clinical Impression Statement  A: Minimal fascial restrictions palpated this session. Progressed to week 4 of the protocol adding AA/ROM in standing. Patient limited to chest height for abduction AA/ROM in standing due to pain. Weighted serratus anterior punch and weighted shoulder depression added. Moderate VC required for form and technique. Patient performed well with exercises with no rest breaks required.   Plan  P: Continue with manual techniques to address fascial restrictions in upper arm and upper trapezius. Continue with week 4 of protocol adding yellow band exercises for IR/er, row, and retraction. Continue with isometrics and increase contraction time to 30 seconds.    Consulted and Agree with Plan of Care  Patient       Patient will benefit from skilled therapeutic intervention in order to improve the following deficits and impairments:  Decreased range of motion, Pain, Impaired UE functional use, Decreased strength, Increased fascial restrictions  Visit Diagnosis: Acute pain of right shoulder  Stiffness of right shoulder, not elsewhere classified  Other symptoms and signs involving the musculoskeletal system    Problem List Patient Active Problem List   Diagnosis Date Noted  . Vaginal Pap smear following hysterectomy for malignancy 03/02/2017  . Screening for colorectal cancer 03/02/2017  . Vaginal atrophy 03/02/2017  . History of ovarian cancer  03/02/2017  . Genetic testing 05/29/2015  . Family history of kidney cancer   . Family history of prostate cancer   . Chemotherapy-induced neuropathy ([place]) 03/31/2015  . Incisional hernia, incarcerated 10/15/2014  . Ventral hernia 07/15/2014  . Ovarian cancer on right ([place]) 06/17/2011  . Hypertension 06/17/2011  . H/O hysterectomy with oophorectomy 06/17/2011   Ailene Ravel, OTR/L,CBIS  [phone]  06/29/2017, 3:15 PM  [place] [address] [place], [place], [address] Phone: [phone]   Fax:  [phone]  Name: Sarah Walker MRN: [id number] Date of Birth: Mar 16, 1950

## 2017-06-29 NOTE — Patient Instructions (Signed)
Perform each exercise _for 10-15 _ reps. 2-3x days.   Protraction - STANDING  Start by holding a wand or cane at chest height.  Next, slowly push the wand outwards in front of your body so that your elbows become fully straightened. Then, return to the original position.     Shoulder FLEXION - STANDING - PALMS UP  In the standing position, hold a wand/cane with both arms, palms up on both sides. Raise up the wand/cane allowing your unaffected arm to perform most of the effort. Your affected arm should be partially relaxed.      Internal/External ROTATION - STANDING  In the standing position, hold a wand/cane with both hands keeping your elbows bent. Move your arms and wand/cane to one side.  Your affected arm should be partially relaxed while your unaffected arm performs most of the effort.       Shoulder ABDUCTION - STANDING  While holding a wand/cane palm face up on the injured side and palm face down on the uninjured side, slowly raise up your injured arm to the side.                     Horizontal Abduction/Adduction      Straight arms holding cane at shoulder height, bring cane to right, center, left. Repeat starting to left.   Copyright  VHI. All rights reserved.

## 2017-07-04 ENCOUNTER — Other Ambulatory Visit: Payer: Self-pay

## 2017-07-04 ENCOUNTER — Ambulatory Visit (HOSPITAL_COMMUNITY): Payer: Medicare Other | Attending: Orthopedic Surgery

## 2017-07-04 ENCOUNTER — Encounter (HOSPITAL_COMMUNITY): Payer: Self-pay

## 2017-07-04 DIAGNOSIS — M25611 Stiffness of right shoulder, not elsewhere classified: Secondary | ICD-10-CM | POA: Diagnosis not present

## 2017-07-04 DIAGNOSIS — M25511 Pain in right shoulder: Secondary | ICD-10-CM

## 2017-07-04 DIAGNOSIS — R29898 Other symptoms and signs involving the musculoskeletal system: Secondary | ICD-10-CM

## 2017-07-04 NOTE — Therapy (Signed)
[place], [place], [address] Phone: [phone]   Fax:  [phone]  Occupational Therapy Treatment  Patient Details  Name: Sarah Walker MRN: [id number] Date of Birth: [birth year] Referring Provider: Fredonia Highland, MD   Encounter Date: 07/04/2017  OT End of Session - 07/04/17 1522    Visit Number  9    Number of Visits  24    Date for OT Re-Evaluation  08/31/17    Authorization Type  1) [employer] medicare $40 copay 2) [employer] no authorization needed.     OT Start Time  1437    OT Stop Time  1517    OT Time Calculation (min)  40 min    Activity Tolerance  Patient tolerated treatment well    Behavior During Therapy  WFL for tasks assessed/performed       Past Medical History:  Diagnosis Date  . Abdominal hernia   . Allergic rhinitis   . Anemia associated with chemotherapy 09/16/2011  . Bone spur of other site    left foot  . Dry eyes   . Family history of kidney cancer   . Family history of prostate cancer   . Hypertension   . Ovarian cancer ([place])   . Ovarian cancer on right ([place]) 06/17/2011  . Peripheral neuropathy 08/26/2011   Chemotherapy-induced  . Pulmonary embolism ([place])   . Sinusitis     Past Surgical History:  Procedure Laterality Date  . ABDOMINAL HYSTERECTOMY  05/25/2011   [place] bs&o and cancer staging  . CHOLECYSTECTOMY  1990  . COLONOSCOPY N/A 09/06/2014   Procedure: COLONOSCOPY;  Surgeon: Rogene Houston, MD;  [facility];  Service: Endoscopy;  Laterality: N/A;  930 - moved to 9/2 @ 8:25  . INCISIONAL HERNIA REPAIR  10/15/2014  . INCISIONAL HERNIA REPAIR  10/15/2014   Procedure: OPEN INCISIONAL HERNIA REPAIR WITH MESH AND MYOFASCIAL FLAPS;  Surgeon: Erroll Luna, MD;  Location: [place];  Service: General;;  . IVC filter  05/24/11  . PORT-A-CATH REMOVAL Right 11/21/2013   Procedure: MINOR REMOVAL PORT-A-CATH;  Surgeon: Jamesetta So, MD;  Location: [facility];  Service: General;  Laterality:  Right;  . PORTACATH PLACEMENT  06/16/11   Done at [facility]  . TONSILLECTOMY     age 59's  . TUBAL LIGATION  1981    There were no vitals filed for this visit.  Subjective Assessment - 07/04/17 1502    Subjective   S: I have no pain, it's just gets a little sore after I do my exercises.    Currently in Pain?  No/denies         [facility] OT Assessment - 07/04/17 1434      Assessment   Medical Diagnosis  Right RTC repair       Precautions   Precautions  Shoulder    Type of Shoulder Precautions  See protocol from Dr. Percell Miller    Shoulder Interventions  Shoulder sling/immobilizer;Shoulder abduction pillow;Off for dressing/bathing/exercises               OT Treatments/Exercises (OP) - 07/04/17 1435      Exercises   Exercises  Shoulder      Shoulder Exercises: Supine   Protraction  PROM;5 reps;AAROM;12 reps    Horizontal ABduction  PROM;5 reps;AAROM;12 reps    External Rotation  PROM;5 reps;AAROM;12 reps    External Rotation Limitations  no limit with arm adducted    Internal Rotation  PROM;5  reps;AAROM;12 reps    Flexion  PROM;5 reps;AAROM;12 reps    ABduction  PROM;5 reps;AAROM;12 reps      Shoulder Exercises: Seated   Retraction  Theraband;10 reps    Theraband Level (Shoulder Retraction)  Level 1 (Yellow)    Row  Theraband;10 reps    Theraband Level (Shoulder Row)  Level 1 (Yellow)    External Rotation  Theraband;10 reps    Theraband Level (Shoulder External Rotation)  Level 1 (Yellow)    Internal Rotation  Theraband;10 reps    Theraband Level (Shoulder Internal Rotation)  Level 1 (Yellow)      Shoulder Exercises: Standing   Protraction  hold off on standing AA/ROM per protocol until week 5      Shoulder Exercises: Isometric Strengthening   Flexion  -- 2X 30"    Extension  -- 2X 30"    External Rotation  -- 2X 30"    Internal Rotation  -- 2X 30"      Manual Therapy   Manual Therapy  Myofascial release    Manual therapy comments  manual therapy  completed seperately from all other interventions this date    Myofascial Release  myofascial release and manual stretching to right upper arm, scapular, and shoulder region to decrease pain and restrictions and imrpove pain free mobility needed for resumption of right arm as dominant             OT Education - 07/04/17 1501    Education Details  Patient instructed to complete AA/ROM in supine only.    Person(s) Educated  Patient    Methods  Explanation;Demonstration;Verbal cues    Comprehension  Verbalized understanding;Returned demonstration       OT Short Term Goals - 06/10/17 1329      OT SHORT TERM GOAL #1   Title  Patient will be educated and independent with HEP to faciliate progress in therapy and allow her to begin using her RUE for her daily tasks.     Time  6    Period  Weeks    Status  On-going      OT SHORT TERM GOAL #2   Title  Patient will increase P/ROM of RUE to [facility] to increase ability to complete dressing tasks with less difficulty and with independence.     Time  6    Period  Weeks    Status  On-going      OT SHORT TERM GOAL #3   Title  Patient will increase RUE strength to 3+/5 to increase ability to complete waist level household tasks and be able to return to piano playing.     Time  6    Period  Weeks    Status  On-going      OT SHORT TERM GOAL #4   Title  Patient will decrease fascial restrictions to min amount in RUE in order to increase functional mobility needed to for reaching tasks.     Time  6    Period  Weeks    Status  On-going      OT SHORT TERM GOAL #5   Title  Patient will report a pain level score of 4/10 when using RUE during daily tasks.     Time  6    Period  Weeks    Status  On-going        OT Long Term Goals - 06/10/17 1329      OT LONG TERM GOAL #1   Title  Patient  will return to highest level of independence while using her RUE as her dominant extremity for all daily and work tasks.     Time  12    Period  Weeks     Status  On-going      OT LONG TERM GOAL #2   Title  Patient will increase A/ROM to WNL to increase ability to complete overhead reaching tasks with less difficulty.     Time  12    Period  Weeks    Status  On-going      OT LONG TERM GOAL #3   Title  Patient will increase RUE strength to 4+/5 to be able to return to normal household lifting tasks.     Time  12    Period  Weeks    Status  On-going      OT LONG TERM GOAL #4   Title  Patient will decrease fascial restrictions to trace amount in RUE in order to increase functional mobility needed for overhead reaching.     Time  12    Period  Weeks    Status  On-going      OT LONG TERM GOAL #5   Title  Patient will report a decrease in pain level of 2/10 or less when completing daily tasks with her RUE.     Time  12    Period  Weeks    Status  On-going            Plan - 07/04/17 1523    Clinical Impression Statement  A: Min/mod fascial restrictions palpated this session. Continued with week 4 of the protocol increasing reps for AA/ROM in supine. Yellow theraband exercises added for IR/er, row, and retraction. Patient able to increase contraction time for isometric strengthening to 30 seconds this session for flexion, extension, and IR/er. Patient requiring min/mod VC for form and technique but is able to perform exercises with minimal pain/soreness. No rest breaks required. Patient instructed to work on AA/ROM in supine at home.     Plan  P: Continue with manual techniques to address fascial restrictions in upper arm and upper trapezius. Progress to week 5 of protocol working on AA/ROM exercises in seated/standing as tolerated.    Consulted and Agree with Plan of Care  Patient       Patient will benefit from skilled therapeutic intervention in order to improve the following deficits and impairments:  Decreased range of motion, Pain, Impaired UE functional use, Decreased strength, Increased fascial restrictions  Visit  Diagnosis: Acute pain of right shoulder  Stiffness of right shoulder, not elsewhere classified  Other symptoms and signs involving the musculoskeletal system    Problem List Patient Active Problem List   Diagnosis Date Noted  . Vaginal Pap smear following hysterectomy for malignancy 03/02/2017  . Screening for colorectal cancer 03/02/2017  . Vaginal atrophy 03/02/2017  . History of ovarian cancer 03/02/2017  . Genetic testing 05/29/2015  . Family history of kidney cancer   . Family history of prostate cancer   . Chemotherapy-induced neuropathy ([place]) 03/31/2015  . Incisional hernia, incarcerated 10/15/2014  . Ventral hernia 07/15/2014  . Ovarian cancer on right ([place]) 06/17/2011  . Hypertension 06/17/2011  . H/O hysterectomy with oophorectomy 06/17/2011   Ailene Ravel, OTR/L,CBIS  [phone]  07/04/2017, 3:27 PM  [place] [address] [place], [place], [address] Phone: [phone]   Fax:  [phone]  Name: Sarah Walker MRN: [id number] Date of Birth:  11/20/1950 

## 2017-07-06 ENCOUNTER — Encounter (HOSPITAL_COMMUNITY): Payer: Self-pay

## 2017-07-06 ENCOUNTER — Other Ambulatory Visit: Payer: Self-pay

## 2017-07-06 ENCOUNTER — Ambulatory Visit (HOSPITAL_COMMUNITY): Payer: Medicare Other

## 2017-07-06 DIAGNOSIS — M25511 Pain in right shoulder: Secondary | ICD-10-CM | POA: Diagnosis not present

## 2017-07-06 DIAGNOSIS — M25611 Stiffness of right shoulder, not elsewhere classified: Secondary | ICD-10-CM

## 2017-07-06 DIAGNOSIS — R29898 Other symptoms and signs involving the musculoskeletal system: Secondary | ICD-10-CM

## 2017-07-06 NOTE — Therapy (Signed)
[place], [place], [address] Phone: [phone]   Fax:  [phone]  Occupational Therapy Treatment  Patient Details  Name: Sarah Walker MRN: [id number] Date of Birth: May 17, 1950 Referring Provider: Fredonia Highland, MD  Progress Note Reporting Period 06/08/17 to 07/06/17  See note below for Objective Data and Assessment of Progress/Goals.       Encounter Date: 07/06/2017  OT End of Session - 07/06/17 1456    Visit Number  10    Number of Visits  24    Date for OT Re-Evaluation  08/31/17 mini reassessment on 7/31    Authorization Type  1) [employer] medicare $40 copay 2) [employer] no authorization needed.     OT Start Time  1434    OT Stop Time  1512    OT Time Calculation (min)  38 min    Activity Tolerance  Patient tolerated treatment well    Behavior During Therapy  WFL for tasks assessed/performed       Past Medical History:  Diagnosis Date  . Abdominal hernia   . Allergic rhinitis   . Anemia associated with chemotherapy 09/16/2011  . Bone spur of other site    left foot  . Dry eyes   . Family history of kidney cancer   . Family history of prostate cancer   . Hypertension   . Ovarian cancer ([place])   . Ovarian cancer on right ([place]) 06/17/2011  . Peripheral neuropathy 08/26/2011   Chemotherapy-induced  . Pulmonary embolism ([place])   . Sinusitis     Past Surgical History:  Procedure Laterality Date  . ABDOMINAL HYSTERECTOMY  05/25/2011   [place] bs&o and cancer staging  . CHOLECYSTECTOMY  1990  . COLONOSCOPY N/A 09/06/2014   Procedure: COLONOSCOPY;  Surgeon: Rogene Houston, MD;  [facility];  Service: Endoscopy;  Laterality: N/A;  930 - moved to 9/2 @ 8:25  . INCISIONAL HERNIA REPAIR  10/15/2014  . INCISIONAL HERNIA REPAIR  10/15/2014   Procedure: OPEN INCISIONAL HERNIA REPAIR WITH MESH AND MYOFASCIAL FLAPS;  Surgeon: Erroll Luna, MD;  Location: [facility];  Service: General;;  . IVC filter  05/24/11  .  PORT-A-CATH REMOVAL Right 11/21/2013   Procedure: MINOR REMOVAL PORT-A-CATH;  Surgeon: Jamesetta So, MD;  Location: [facility];  Service: General;  Laterality: Right;  . PORTACATH PLACEMENT  06/16/11   Done at [facility]  . TONSILLECTOMY     age 88's  . TUBAL LIGATION  1981    There were no vitals filed for this visit.  Subjective Assessment - 07/06/17 1435    Subjective   S: I haven't been doing as much at work since [employer] not supposed to use the arm.    Currently in Pain?  No/denies         [facility] OT Assessment - 07/06/17 1434      Assessment   Medical Diagnosis  Right RTC repair       Precautions   Precautions  Shoulder    Type of Shoulder Precautions  See protocol from Dr. Percell Miller    Shoulder Interventions  Shoulder sling/immobilizer;Shoulder abduction pillow;Off for dressing/bathing/exercises      ROM / Strength   AROM / PROM / Strength  AROM;PROM      AROM   Overall AROM Comments  assessed supine; IR/er adducted not previously assessed due to precautions    AROM Assessment Site  Shoulder    Right/Left Shoulder  Right  Right Shoulder Flexion  145 Degrees    Right Shoulder ABduction  180 Degrees    Right Shoulder Internal Rotation  90 Degrees    Right Shoulder External Rotation  70 Degrees      PROM   Overall PROM Comments  assessed supine; IR/er adducted    PROM Assessment Site  Shoulder    Right/Left Shoulder  Right    Right Shoulder Flexion  150 Degrees previous: 115    Right Shoulder ABduction  180 Degrees previous: 70 (per protocol)    Right Shoulder Internal Rotation  90 Degrees same as previous    Right Shoulder External Rotation  70 Degrees previous: 30 (per protocol)               OT Treatments/Exercises (OP) - 07/06/17 1434      Exercises   Exercises  Shoulder      Shoulder Exercises: Supine   Protraction  PROM;5 reps;AAROM;15 reps    Horizontal ABduction  PROM;5 reps;AAROM;15 reps    External Rotation  PROM;5 reps;AAROM;15 reps     Internal Rotation  PROM;5 reps;AAROM;15 reps    Flexion  PROM;5 reps;AAROM;15 reps    ABduction  PROM;5 reps;AAROM;15 reps      Shoulder Exercises: Standing   Protraction  AAROM;10 reps    Horizontal ABduction  AAROM;10 reps    External Rotation  AAROM;10 reps    Internal Rotation  AAROM;10 reps    Flexion  AAROM;10 reps      Manual Therapy   Manual Therapy  Myofascial release    Manual therapy comments  manual therapy completed seperately from all other interventions this date    Myofascial Release  myofascial release and manual stretching to right upper arm, scapular, and shoulder region to decrease pain and restrictions and imrpove pain free mobility needed for resumption of right arm as dominant               OT Short Term Goals - 07/06/17 1458      OT SHORT TERM GOAL #1   Title  Patient will be educated and independent with HEP to faciliate progress in therapy and allow her to begin using her RUE for her daily tasks.     Time  6    Period  Weeks    Status  On-going      OT SHORT TERM GOAL #2   Title  Patient will increase P/ROM of RUE to [facility] to increase ability to complete dressing tasks with less difficulty and with independence.     Time  6    Period  Weeks    Status  On-going      OT SHORT TERM GOAL #3   Title  Patient will increase RUE strength to 3+/5 to increase ability to complete waist level household tasks and be able to return to piano playing.     Time  6    Period  Weeks    Status  On-going      OT SHORT TERM GOAL #4   Title  Patient will decrease fascial restrictions to min amount in RUE in order to increase functional mobility needed to for reaching tasks.     Time  6    Period  Weeks    Status  On-going      OT SHORT TERM GOAL #5   Title  Patient will report a pain level score of 4/10 when using RUE during daily tasks.     Time  6  Period  Weeks    Status  On-going        OT Long Term Goals - 06/10/17 1329      OT LONG TERM GOAL #1    Title  Patient will return to highest level of independence while using her RUE as her dominant extremity for all daily and work tasks.     Time  12    Period  Weeks    Status  On-going      OT LONG TERM GOAL #2   Title  Patient will increase A/ROM to WNL to increase ability to complete overhead reaching tasks with less difficulty.     Time  12    Period  Weeks    Status  On-going      OT LONG TERM GOAL #3   Title  Patient will increase RUE strength to 4+/5 to be able to return to normal household lifting tasks.     Time  12    Period  Weeks    Status  On-going      OT LONG TERM GOAL #4   Title  Patient will decrease fascial restrictions to trace amount in RUE in order to increase functional mobility needed for overhead reaching.     Time  12    Period  Weeks    Status  On-going      OT LONG TERM GOAL #5   Title  Patient will report a decrease in pain level of 2/10 or less when completing daily tasks with her RUE.     Time  12    Period  Weeks    Status  On-going            Plan - 07/06/17 1500    Clinical Impression Statement  A: Mini reassessment completed. Patient demonstrating WFL P/ROM and A/ROM in supine. Min/mod fascial restrictions palpated this session in upper and anterior arm. Continued with week 4 of the protocol increasing reps for AA/ROM in supine. Patient able to progress to AA/ROM in standing. Patient requiring min/mod VC for form and technique but is able to perform exercises with minimal pain/soreness. No rest breaks required.     Plan  P: Continue with manual techniques to address fascial restrictions in upper arm and upper trapezius. Progress to week 5 of protocol adding AA/ROM for abduction in standing. Add horizontal abduction and flexion yellow theraband exercises and continue with previous yellow theraband exercises.    Consulted and Agree with Plan of Care  Patient       Patient will benefit from skilled therapeutic intervention in order to  improve the following deficits and impairments:  Decreased range of motion, Pain, Impaired UE functional use, Decreased strength, Increased fascial restrictions  Visit Diagnosis: Acute pain of right shoulder  Stiffness of right shoulder, not elsewhere classified  Other symptoms and signs involving the musculoskeletal system    Problem List Patient Active Problem List   Diagnosis Date Noted  . Vaginal Pap smear following hysterectomy for malignancy 03/02/2017  . Screening for colorectal cancer 03/02/2017  . Vaginal atrophy 03/02/2017  . History of ovarian cancer 03/02/2017  . Genetic testing 05/29/2015  . Family history of kidney cancer   . Family history of prostate cancer   . Chemotherapy-induced neuropathy ([place]) 03/31/2015  . Incisional hernia, incarcerated 10/15/2014  . Ventral hernia 07/15/2014  . Ovarian cancer on right ([place]) 06/17/2011  . Hypertension 06/17/2011  . H/O hysterectomy with oophorectomy 06/17/2011   Ailene Ravel, OTR/L,CBIS  [phone]   07/06/2017, 3:10 PM  [place], [place], [address] Phone: [phone]   Fax:  [phone]  Name: Sarah Walker MRN: [id number] Date of Birth: [birth year]

## 2017-07-11 ENCOUNTER — Encounter (HOSPITAL_COMMUNITY): Payer: Self-pay

## 2017-07-11 ENCOUNTER — Other Ambulatory Visit: Payer: Self-pay

## 2017-07-11 ENCOUNTER — Ambulatory Visit (HOSPITAL_COMMUNITY): Payer: Medicare Other

## 2017-07-11 DIAGNOSIS — M25511 Pain in right shoulder: Secondary | ICD-10-CM | POA: Diagnosis not present

## 2017-07-11 DIAGNOSIS — R29898 Other symptoms and signs involving the musculoskeletal system: Secondary | ICD-10-CM | POA: Diagnosis not present

## 2017-07-11 DIAGNOSIS — M25611 Stiffness of right shoulder, not elsewhere classified: Secondary | ICD-10-CM | POA: Diagnosis not present

## 2017-07-11 NOTE — Patient Instructions (Signed)

## 2017-07-11 NOTE — Therapy (Signed)
[place], [place], [address] Phone: [phone]   Fax:  [phone]  Occupational Therapy Treatment  Patient Details  Name: Sarah Walker MRN: [id number] Date of Birth: 1950-08-15 Referring Provider: Fredonia Highland, MD   Encounter Date: 07/11/2017  OT End of Session - 07/11/17 1454    Visit Number  11    Number of Visits  24    Date for OT Re-Evaluation  08/31/17 mini reassessment on 7/31    Authorization Type  1) [employer] medicare $40 copay 2) [employer] no authorization needed.     OT Start Time  1432    OT Stop Time  1512    OT Time Calculation (min)  40 min    Activity Tolerance  Patient tolerated treatment well    Behavior During Therapy  WFL for tasks assessed/performed       Past Medical History:  Diagnosis Date  . Abdominal hernia   . Allergic rhinitis   . Anemia associated with chemotherapy 09/16/2011  . Bone spur of other site    left foot  . Dry eyes   . Family history of kidney cancer   . Family history of prostate cancer   . Hypertension   . Ovarian cancer ([place])   . Ovarian cancer on right ([place]) 06/17/2011  . Peripheral neuropathy 08/26/2011   Chemotherapy-induced  . Pulmonary embolism ([place])   . Sinusitis     Past Surgical History:  Procedure Laterality Date  . ABDOMINAL HYSTERECTOMY  05/25/2011   [place] bs&o and cancer staging  . CHOLECYSTECTOMY  1990  . COLONOSCOPY N/A 09/06/2014   Procedure: COLONOSCOPY;  Surgeon: Rogene Houston, MD;  [facility];  Service: Endoscopy;  Laterality: N/A;  930 - moved to 9/2 @ 8:25  . INCISIONAL HERNIA REPAIR  10/15/2014  . INCISIONAL HERNIA REPAIR  10/15/2014   Procedure: OPEN INCISIONAL HERNIA REPAIR WITH MESH AND MYOFASCIAL FLAPS;  Surgeon: Erroll Luna, MD;  Location: [facility];  Service: General;;  . IVC filter  05/24/11  . PORT-A-CATH REMOVAL Right 11/21/2013   Procedure: MINOR REMOVAL PORT-A-CATH;  Surgeon: Jamesetta So, MD;  Location: [facility];   Service: General;  Laterality: Right;  . PORTACATH PLACEMENT  06/16/11   Done at [facility]  . TONSILLECTOMY     age 67's  . TUBAL LIGATION  1981    There were no vitals filed for this visit.  Subjective Assessment - 07/11/17 1434    Subjective   S: I've been doing all the exercise laying down except the shovel one.    Currently in Pain?  No/denies         [facility] OT Assessment - 07/11/17 1432      Assessment   Medical Diagnosis  Right RTC repair       Precautions   Precautions  Shoulder    Type of Shoulder Precautions  See protocol from Dr. Percell Miller    Shoulder Interventions  Shoulder sling/immobilizer;Shoulder abduction pillow;Off for dressing/bathing/exercises               OT Treatments/Exercises (OP) - 07/11/17 1433      Exercises   Exercises  Shoulder      Shoulder Exercises: Supine   Protraction  PROM;5 reps;AAROM;15 reps    Horizontal ABduction  PROM;5 reps;AAROM;15 reps    External Rotation  PROM;5 reps;AAROM;15 reps    Internal Rotation  PROM;5 reps;AAROM;15 reps    Flexion  PROM;5 reps;AAROM;15 reps  ABduction  PROM;5 reps;AAROM;15 reps      Shoulder Exercises: Standing   Protraction  AAROM;10 reps    Horizontal ABduction  AAROM;Theraband;10 reps    Theraband Level (Shoulder Horizontal ABduction)  Level 1 (Yellow)    External Rotation  AAROM;10 reps    Internal Rotation  AAROM;10 reps    Flexion  AAROM;Theraband;10 reps    Theraband Level (Shoulder Flexion)  Level 1 (Yellow)    ABduction  AAROM;10 reps      Manual Therapy   Manual Therapy  Myofascial release    Manual therapy comments  manual therapy completed seperately from all other interventions this date             OT Education - 07/11/17 1434    Education Details  Patient instructed to progress to AA/ROM in standing.    Person(s) Educated  Patient    Methods  Explanation;Demonstration;Verbal cues    Comprehension  Verbalized understanding;Returned demonstration        OT Short Term Goals - 07/06/17 1458      OT SHORT TERM GOAL #1   Title  Patient will be educated and independent with HEP to faciliate progress in therapy and allow her to begin using her RUE for her daily tasks.     Time  6    Period  Weeks    Status  On-going      OT SHORT TERM GOAL #2   Title  Patient will increase P/ROM of RUE to [facility] to increase ability to complete dressing tasks with less difficulty and with independence.     Time  6    Period  Weeks    Status  On-going      OT SHORT TERM GOAL #3   Title  Patient will increase RUE strength to 3+/5 to increase ability to complete waist level household tasks and be able to return to piano playing.     Time  6    Period  Weeks    Status  On-going      OT SHORT TERM GOAL #4   Title  Patient will decrease fascial restrictions to min amount in RUE in order to increase functional mobility needed to for reaching tasks.     Time  6    Period  Weeks    Status  On-going      OT SHORT TERM GOAL #5   Title  Patient will report a pain level score of 4/10 when using RUE during daily tasks.     Time  6    Period  Weeks    Status  On-going        OT Long Term Goals - 06/10/17 1329      OT LONG TERM GOAL #1   Title  Patient will return to highest level of independence while using her RUE as her dominant extremity for all daily and work tasks.     Time  12    Period  Weeks    Status  On-going      OT LONG TERM GOAL #2   Title  Patient will increase A/ROM to WNL to increase ability to complete overhead reaching tasks with less difficulty.     Time  12    Period  Weeks    Status  On-going      OT LONG TERM GOAL #3   Title  Patient will increase RUE strength to 4+/5 to be able to return to normal household lifting tasks.  Time  12    Period  Weeks    Status  On-going      OT LONG TERM GOAL #4   Title  Patient will decrease fascial restrictions to trace amount in RUE in order to increase functional mobility needed for  overhead reaching.     Time  12    Period  Weeks    Status  On-going      OT LONG TERM GOAL #5   Title  Patient will report a decrease in pain level of 2/10 or less when completing daily tasks with her RUE.     Time  12    Period  Weeks    Status  On-going            Plan - 07/11/17 1456    Clinical Impression Statement  A: Min/mod fascial restrictions palpated this session in upper and anterior arm. Continued with week 5 of the protocol adding abduction for AA/ROM in standing. Horizontal abduction and flexion yellow theraband exercises added. Patient instructed to progress to AA/ROM in standing for HEP. Patient requiring min/mod VC for form and technique but is able to perform exercises with minimal pain/soreness. Moderate VC to avoid compensatory movements during yellow theraband abduction and flexion. No rest breaks required.     Plan  P: Continue with manual techniques to address fascial restrictions in upper arm and upper trapezius. Continue with  week 5 of protocol increasing reps for standing AA/ROM exercises. Continue with yellow theraband exercises.    Consulted and Agree with Plan of Care  Patient       Patient will benefit from skilled therapeutic intervention in order to improve the following deficits and impairments:  Decreased range of motion, Pain, Impaired UE functional use, Decreased strength, Increased fascial restrictions  Visit Diagnosis: Acute pain of right shoulder  Stiffness of right shoulder, not elsewhere classified  Other symptoms and signs involving the musculoskeletal system    Problem List Patient Active Problem List   Diagnosis Date Noted  . Vaginal Pap smear following hysterectomy for malignancy 03/02/2017  . Screening for colorectal cancer 03/02/2017  . Vaginal atrophy 03/02/2017  . History of ovarian cancer 03/02/2017  . Genetic testing 05/29/2015  . Family history of kidney cancer   . Family history of prostate cancer   .  Chemotherapy-induced neuropathy ([place]) 03/31/2015  . Incisional hernia, incarcerated 10/15/2014  . Ventral hernia 07/15/2014  . Ovarian cancer on right ([place]) 06/17/2011  . Hypertension 06/17/2011  . H/O hysterectomy with oophorectomy 06/17/2011     Ailene Ravel, OTR/L,CBIS  [phone]  07/11/2017, 3:15 PM  [place] [address] [place], [place], [address] Phone: [phone]   Fax:  [phone]  Name: VALARIE FARACE MRN: [id number] Date of Birth: 05/11/1950

## 2017-07-13 ENCOUNTER — Ambulatory Visit (HOSPITAL_COMMUNITY): Payer: Medicare Other | Admitting: Occupational Therapy

## 2017-07-13 ENCOUNTER — Encounter (HOSPITAL_COMMUNITY): Payer: Self-pay | Admitting: Occupational Therapy

## 2017-07-13 DIAGNOSIS — M25511 Pain in right shoulder: Secondary | ICD-10-CM

## 2017-07-13 DIAGNOSIS — M25611 Stiffness of right shoulder, not elsewhere classified: Secondary | ICD-10-CM

## 2017-07-13 DIAGNOSIS — R29898 Other symptoms and signs involving the musculoskeletal system: Secondary | ICD-10-CM | POA: Diagnosis not present

## 2017-07-13 NOTE — Therapy (Signed)
[place], [place], [address] Phone: [phone]   Fax:  [phone]  Occupational Therapy Treatment  Patient Details  Name: Sarah Walker MRN: [id number] Date of Birth: 22-Oct-1950 Referring Provider: Fredonia Highland, MD   Encounter Date: 07/13/2017  OT End of Session - 07/13/17 1523    Visit Number  12    Number of Visits  24    Date for OT Re-Evaluation  08/31/17 mini reassessment on 7/31    Authorization Type  1) [employer] medicare $40 copay 2) [employer] no authorization needed.     OT Start Time  1348    OT Stop Time  1426    OT Time Calculation (min)  38 min    Activity Tolerance  Patient tolerated treatment well    Behavior During Therapy  WFL for tasks assessed/performed       Past Medical History:  Diagnosis Date  . Abdominal hernia   . Allergic rhinitis   . Anemia associated with chemotherapy 09/16/2011  . Bone spur of other site    left foot  . Dry eyes   . Family history of kidney cancer   . Family history of prostate cancer   . Hypertension   . Ovarian cancer ([place])   . Ovarian cancer on right ([place]) 06/17/2011  . Peripheral neuropathy 08/26/2011   Chemotherapy-induced  . Pulmonary embolism ([place])   . Sinusitis     Past Surgical History:  Procedure Laterality Date  . ABDOMINAL HYSTERECTOMY  05/25/2011   [place] bs&o and cancer staging  . CHOLECYSTECTOMY  1990  . COLONOSCOPY N/A 09/06/2014   Procedure: COLONOSCOPY;  Surgeon: Rogene Houston, MD;  [facility];  Service: Endoscopy;  Laterality: N/A;  930 - moved to 9/2 @ 8:25  . INCISIONAL HERNIA REPAIR  10/15/2014  . INCISIONAL HERNIA REPAIR  10/15/2014   Procedure: OPEN INCISIONAL HERNIA REPAIR WITH MESH AND MYOFASCIAL FLAPS;  Surgeon: Erroll Luna, MD;  Location: [facility];  Service: General;;  . IVC filter  05/24/11  . PORT-A-CATH REMOVAL Right 11/21/2013   Procedure: MINOR REMOVAL PORT-A-CATH;  Surgeon: Jamesetta So, MD;  Location: [facility];   Service: General;  Laterality: Right;  . PORTACATH PLACEMENT  06/16/11   Done at [facility]  . TONSILLECTOMY     age 8's  . TUBAL LIGATION  1981    There were no vitals filed for this visit.  Subjective Assessment - 07/13/17 1348    Subjective   S: I go back to the doctor on Friday.     Currently in Pain?  No/denies         [facility] OT Assessment - 07/13/17 1347      Assessment   Medical Diagnosis  Right RTC repair       Precautions   Precautions  Shoulder    Type of Shoulder Precautions  See protocol from Dr. Percell Miller    Shoulder Interventions  Shoulder sling/immobilizer;Shoulder abduction pillow;Off for dressing/bathing/exercises               OT Treatments/Exercises (OP) - 07/13/17 1351      Exercises   Exercises  Shoulder      Shoulder Exercises: Supine   Protraction  PROM;5 reps;AAROM;15 reps    Horizontal ABduction  PROM;5 reps;AAROM;15 reps    External Rotation  PROM;5 reps;AAROM;15 reps    Internal Rotation  PROM;5 reps;AAROM;15 reps    Flexion  PROM;5 reps;AAROM;15 reps    ABduction  PROM;5 reps;AAROM;15 reps      Shoulder Exercises: Standing   Protraction  AAROM;12 reps    Horizontal ABduction  AAROM;12 reps;Theraband;10 reps    Theraband Level (Shoulder Horizontal ABduction)  Level 1 (Yellow)    External Rotation  AAROM;12 reps    Internal Rotation  AAROM;12 reps    Flexion  AAROM;12 reps;Theraband;10 reps    Theraband Level (Shoulder Flexion)  Level 1 (Yellow)    ABduction  AAROM;12 reps      Shoulder Exercises: ROM/Strengthening   Wall Wash  1'    Proximal Shoulder Strengthening, Supine  10X each no rest breaks      Manual Therapy   Manual Therapy  Myofascial release    Manual therapy comments  manual therapy completed seperately from all other interventions this date    Myofascial Release  myofascial release and manual stretching to right upper arm, scapular, and shoulder region to decrease pain and restrictions and imrpove pain free  mobility needed for resumption of right arm as dominant               OT Short Term Goals - 07/06/17 1458      OT SHORT TERM GOAL #1   Title  Patient will be educated and independent with HEP to faciliate progress in therapy and allow her to begin using her RUE for her daily tasks.     Time  6    Period  Weeks    Status  On-going      OT SHORT TERM GOAL #2   Title  Patient will increase P/ROM of RUE to [facility] to increase ability to complete dressing tasks with less difficulty and with independence.     Time  6    Period  Weeks    Status  On-going      OT SHORT TERM GOAL #3   Title  Patient will increase RUE strength to 3+/5 to increase ability to complete waist level household tasks and be able to return to piano playing.     Time  6    Period  Weeks    Status  On-going      OT SHORT TERM GOAL #4   Title  Patient will decrease fascial restrictions to min amount in RUE in order to increase functional mobility needed to for reaching tasks.     Time  6    Period  Weeks    Status  On-going      OT SHORT TERM GOAL #5   Title  Patient will report a pain level score of 4/10 when using RUE during daily tasks.     Time  6    Period  Weeks    Status  On-going        OT Long Term Goals - 06/10/17 1329      OT LONG TERM GOAL #1   Title  Patient will return to highest level of independence while using her RUE as her dominant extremity for all daily and work tasks.     Time  12    Period  Weeks    Status  On-going      OT LONG TERM GOAL #2   Title  Patient will increase A/ROM to WNL to increase ability to complete overhead reaching tasks with less difficulty.     Time  12    Period  Weeks    Status  On-going      OT LONG TERM GOAL #3   Title  Patient will increase RUE strength to 4+/5 to be able to return to normal household lifting tasks.     Time  12    Period  Weeks    Status  On-going      OT LONG TERM GOAL #4   Title  Patient will decrease fascial restrictions  to trace amount in RUE in order to increase functional mobility needed for overhead reaching.     Time  12    Period  Weeks    Status  On-going      OT LONG TERM GOAL #5   Title  Patient will report a decrease in pain level of 2/10 or less when completing daily tasks with her RUE.     Time  12    Period  Weeks    Status  On-going            Plan - 07/13/17 1523    Clinical Impression Statement  A: Continued with manual therapy to address fascial restrictions in RUE limiting ROM. Continued to follow week 5 of protocol with AA/ROM and yellow theraband. Also added wall wash and proximal shoulder strengthening in supine today. Pt with mod difficulty maintaining shoulder flexion during horizontal abduction. Verbal cuing for form and technique during session.     Plan  P: Follow up on MD appt. Continue with manual therapy and progress to week 6 of protocol.        Patient will benefit from skilled therapeutic intervention in order to improve the following deficits and impairments:  Decreased range of motion, Pain, Impaired UE functional use, Decreased strength, Increased fascial restrictions  Visit Diagnosis: Acute pain of right shoulder  Stiffness of right shoulder, not elsewhere classified  Other symptoms and signs involving the musculoskeletal system    Problem List Patient Active Problem List   Diagnosis Date Noted  . Vaginal Pap smear following hysterectomy for malignancy 03/02/2017  . Screening for colorectal cancer 03/02/2017  . Vaginal atrophy 03/02/2017  . History of ovarian cancer 03/02/2017  . Genetic testing 05/29/2015  . Family history of kidney cancer   . Family history of prostate cancer   . Chemotherapy-induced neuropathy ([place]) 03/31/2015  . Incisional hernia, incarcerated 10/15/2014  . Ventral hernia 07/15/2014  . Ovarian cancer on right ([place]) 06/17/2011  . Hypertension 06/17/2011  . H/O hysterectomy with oophorectomy 06/17/2011   Guadelupe Sabin, OTR/L   [phone] 07/13/2017, 3:25 PM  [facility] [address] [place], [place], [address] Phone: [phone]   Fax:  [phone]  Name: Sarah Walker MRN: [id number] Date of Birth: 01-04-1951

## 2017-07-15 DIAGNOSIS — M19011 Primary osteoarthritis, right shoulder: Secondary | ICD-10-CM | POA: Diagnosis not present

## 2017-07-18 ENCOUNTER — Other Ambulatory Visit: Payer: Self-pay

## 2017-07-18 ENCOUNTER — Encounter (HOSPITAL_COMMUNITY): Payer: Self-pay

## 2017-07-18 ENCOUNTER — Ambulatory Visit (HOSPITAL_COMMUNITY): Payer: Medicare Other

## 2017-07-18 DIAGNOSIS — M25511 Pain in right shoulder: Secondary | ICD-10-CM | POA: Diagnosis not present

## 2017-07-18 DIAGNOSIS — M25611 Stiffness of right shoulder, not elsewhere classified: Secondary | ICD-10-CM

## 2017-07-18 DIAGNOSIS — R29898 Other symptoms and signs involving the musculoskeletal system: Secondary | ICD-10-CM

## 2017-07-18 NOTE — Therapy (Signed)
[place], [place], [address] Phone: [phone]   Fax:  [phone]  Occupational Therapy Treatment  Patient Details  Name: Sarah Walker MRN: [id number] Date of Birth: 1950/07/03 Referring Provider: Fredonia Highland, MD   Encounter Date: 07/18/2017  OT End of Session - 07/18/17 1502    Visit Number  13    Number of Visits  24    Date for OT Re-Evaluation  08/31/17 mini reassessment on 7/31    Authorization Type  1) [employer] medicare $40 copay 2) [employer] no authorization needed.     OT Start Time  1433    OT Stop Time  1512    OT Time Calculation (min)  39 min    Activity Tolerance  Patient tolerated treatment well    Behavior During Therapy  WFL for tasks assessed/performed       Past Medical History:  Diagnosis Date  . Abdominal hernia   . Allergic rhinitis   . Anemia associated with chemotherapy 09/16/2011  . Bone spur of other site    left foot  . Dry eyes   . Family history of kidney cancer   . Family history of prostate cancer   . Hypertension   . Ovarian cancer ([place])   . Ovarian cancer on right ([place]) 06/17/2011  . Peripheral neuropathy 08/26/2011   Chemotherapy-induced  . Pulmonary embolism ([place])   . Sinusitis     Past Surgical History:  Procedure Laterality Date  . ABDOMINAL HYSTERECTOMY  05/25/2011   [place] bs&o and cancer staging  . CHOLECYSTECTOMY  1990  . COLONOSCOPY N/A 09/06/2014   Procedure: COLONOSCOPY;  Surgeon: Rogene Houston, MD;  [facility];  Service: Endoscopy;  Laterality: N/A;  930 - moved to 9/2 @ 8:25  . INCISIONAL HERNIA REPAIR  10/15/2014  . INCISIONAL HERNIA REPAIR  10/15/2014   Procedure: OPEN INCISIONAL HERNIA REPAIR WITH MESH AND MYOFASCIAL FLAPS;  Surgeon: Erroll Luna, MD;  Location: [facility];  Service: General;;  . IVC filter  05/24/11  . PORT-A-CATH REMOVAL Right 11/21/2013   Procedure: MINOR REMOVAL PORT-A-CATH;  Surgeon: Jamesetta So, MD;  [facility];   Service: General;  Laterality: Right;  . PORTACATH PLACEMENT  06/16/11   Done at [facility]  . TONSILLECTOMY     age 67's  . TUBAL LIGATION  1981    There were no vitals filed for this visit.  Subjective Assessment - 07/18/17 1435    Subjective   S: I went to the doctor and he said I can go ahead and start moving it without weight some.    Currently in Pain?  No/denies         [facility] OT Assessment - 07/18/17 1433      Assessment   Medical Diagnosis  Right RTC repair       Precautions   Precautions  Shoulder    Type of Shoulder Precautions  See protocol from Dr. Percell Miller    Shoulder Interventions  --               OT Treatments/Exercises (OP) - 07/18/17 1433      Exercises   Exercises  Shoulder      Shoulder Exercises: Supine   Protraction  PROM;5 reps;AROM;10 reps    Horizontal ABduction  PROM;5 reps;AROM;10 reps    External Rotation  PROM;5 reps;AROM;10 reps    Internal Rotation  PROM;5 reps;AROM;10 reps    Flexion  PROM;5 reps;AROM;10  reps    ABduction  PROM;5 reps;AROM;10 reps      Shoulder Exercises: Standing   Protraction  AROM;10 reps    Horizontal ABduction  AROM;10 reps    External Rotation  AROM;10 reps    Internal Rotation  AROM;10 reps    Flexion  AROM;10 reps    ABduction  AROM;10 reps    Row  Theraband;10 reps    Theraband Level (Shoulder Row)  Level 2 (Red)      Shoulder Exercises: ROM/Strengthening   Wall Wash  1'    Proximal Shoulder Strengthening, Supine  10X each no rest breaks    Proximal Shoulder Strengthening, Seated  10X each no rest breaks      Manual Therapy   Manual Therapy  Myofascial release    Manual therapy comments  manual therapy completed seperately from all other interventions this date    Myofascial Release  myofascial release and manual stretching to right upper arm, scapular, and shoulder region to decrease pain and restrictions and imrpove pain free mobility needed for resumption of right arm as dominant              OT Education - 07/18/17 1456    Education Details  Patient instructed to progress to shoulder A/ROM in HEP.    Person(s) Educated  Patient    Methods  Explanation;Demonstration;Verbal cues;Handout    Comprehension  Verbalized understanding;Returned demonstration       OT Short Term Goals - 07/06/17 1458      OT SHORT TERM GOAL #1   Title  Patient will be educated and independent with HEP to faciliate progress in therapy and allow her to begin using her RUE for her daily tasks.     Time  6    Period  Weeks    Status  On-going      OT SHORT TERM GOAL #2   Title  Patient will increase P/ROM of RUE to [facility] to increase ability to complete dressing tasks with less difficulty and with independence.     Time  6    Period  Weeks    Status  On-going      OT SHORT TERM GOAL #3   Title  Patient will increase RUE strength to 3+/5 to increase ability to complete waist level household tasks and be able to return to piano playing.     Time  6    Period  Weeks    Status  On-going      OT SHORT TERM GOAL #4   Title  Patient will decrease fascial restrictions to min amount in RUE in order to increase functional mobility needed to for reaching tasks.     Time  6    Period  Weeks    Status  On-going      OT SHORT TERM GOAL #5   Title  Patient will report a pain level score of 4/10 when using RUE during daily tasks.     Time  6    Period  Weeks    Status  On-going        OT Long Term Goals - 06/10/17 1329      OT LONG TERM GOAL #1   Title  Patient will return to highest level of independence while using her RUE as her dominant extremity for all daily and work tasks.     Time  12    Period  Weeks    Status  On-going  OT LONG TERM GOAL #2   Title  Patient will increase A/ROM to WNL to increase ability to complete overhead reaching tasks with less difficulty.     Time  12    Period  Weeks    Status  On-going      OT LONG TERM GOAL #3   Title  Patient will  increase RUE strength to 4+/5 to be able to return to normal household lifting tasks.     Time  12    Period  Weeks    Status  On-going      OT LONG TERM GOAL #4   Title  Patient will decrease fascial restrictions to trace amount in RUE in order to increase functional mobility needed for overhead reaching.     Time  12    Period  Weeks    Status  On-going      OT LONG TERM GOAL #5   Title  Patient will report a decrease in pain level of 2/10 or less when completing daily tasks with her RUE.     Time  12    Period  Weeks    Status  On-going            Plan - 07/18/17 1504    Clinical Impression Statement  A: Continued with manual therapy to address fascial restrictions in RUE limiting ROM. Progressed to week 6 of protocol progressing to A/ROM in supine and standing. Pt with minimal difficulty maintaining shoulder flexion during horizontal abduction. Shoulder flexion improved following rest breaks. A/ROM abduction limited to shoulder height in standing due to pain/fatigue. Scapular theraband row exercise added. Verbal cuing for form and technique during session.     Plan  P: Continue with A/ROM increasing reps as tolerated. Add remaining scapular theraband excercises and update HEP.    Consulted and Agree with Plan of Care  Patient       Patient will benefit from skilled therapeutic intervention in order to improve the following deficits and impairments:  Decreased range of motion, Pain, Impaired UE functional use, Decreased strength, Increased fascial restrictions  Visit Diagnosis: Acute pain of right shoulder  Stiffness of right shoulder, not elsewhere classified  Other symptoms and signs involving the musculoskeletal system    Problem List Patient Active Problem List   Diagnosis Date Noted  . Vaginal Pap smear following hysterectomy for malignancy 03/02/2017  . Screening for colorectal cancer 03/02/2017  . Vaginal atrophy 03/02/2017  . History of ovarian cancer  03/02/2017  . Genetic testing 05/29/2015  . Family history of kidney cancer   . Family history of prostate cancer   . Chemotherapy-induced neuropathy ([place]) 03/31/2015  . Incisional hernia, incarcerated 10/15/2014  . Ventral hernia 07/15/2014  . Ovarian cancer on right ([place]) 06/17/2011  . Hypertension 06/17/2011  . H/O hysterectomy with oophorectomy 06/17/2011   Ailene Ravel, OTR/L,CBIS  [phone]  07/18/2017, 3:41 PM  [facility] [address] [place], [place], [address] Phone: [phone]   Fax:  [phone]  Name: Sarah Walker MRN: [id number] Date of Birth: July 22, 1950

## 2017-07-18 NOTE — Patient Instructions (Signed)

## 2017-07-20 ENCOUNTER — Other Ambulatory Visit: Payer: Self-pay

## 2017-07-20 ENCOUNTER — Encounter (HOSPITAL_COMMUNITY): Payer: Self-pay

## 2017-07-20 ENCOUNTER — Ambulatory Visit (HOSPITAL_COMMUNITY): Payer: Medicare Other

## 2017-07-20 DIAGNOSIS — R29898 Other symptoms and signs involving the musculoskeletal system: Secondary | ICD-10-CM

## 2017-07-20 DIAGNOSIS — M25511 Pain in right shoulder: Secondary | ICD-10-CM | POA: Diagnosis not present

## 2017-07-20 DIAGNOSIS — M25611 Stiffness of right shoulder, not elsewhere classified: Secondary | ICD-10-CM | POA: Diagnosis not present

## 2017-07-20 NOTE — Therapy (Signed)
[place], [place], [address] Phone: [phone]   Fax:  [phone]  Occupational Therapy Treatment  Patient Details  Name: Sarah Walker MRN: [id number] Date of Birth: 1950-08-17 Referring Provider: Fredonia Highland, MD   Encounter Date: 07/20/2017  OT End of Session - 07/20/17 1459    Visit Number  14    Number of Visits  24    Date for OT Re-Evaluation  08/31/17 mini reassessment on 7/31    Authorization Type  1) [employer] medicare $40 copay 2) [employer] no authorization needed.     OT Start Time  1434    OT Stop Time  1514    OT Time Calculation (min)  40 min    Activity Tolerance  Patient tolerated treatment well    Behavior During Therapy  WFL for tasks assessed/performed       Past Medical History:  Diagnosis Date  . Abdominal hernia   . Allergic rhinitis   . Anemia associated with chemotherapy 09/16/2011  . Bone spur of other site    left foot  . Dry eyes   . Family history of kidney cancer   . Family history of prostate cancer   . Hypertension   . Ovarian cancer ([place])   . Ovarian cancer on right ([place]) 06/17/2011  . Peripheral neuropathy 08/26/2011   Chemotherapy-induced  . Pulmonary embolism ([place])   . Sinusitis     Past Surgical History:  Procedure Laterality Date  . ABDOMINAL HYSTERECTOMY  05/25/2011   [place] bs&o and cancer staging  . CHOLECYSTECTOMY  1990  . COLONOSCOPY N/A 09/06/2014   Procedure: COLONOSCOPY;  Surgeon: Rogene Houston, MD;  [facility];  Service: Endoscopy;  Laterality: N/A;  930 - moved to 9/2 @ 8:25  . INCISIONAL HERNIA REPAIR  10/15/2014  . INCISIONAL HERNIA REPAIR  10/15/2014   Procedure: OPEN INCISIONAL HERNIA REPAIR WITH MESH AND MYOFASCIAL FLAPS;  Surgeon: Erroll Luna, MD;  Location: [facility];  Service: General;;  . IVC filter  05/24/11  . PORT-A-CATH REMOVAL Right 11/21/2013   Procedure: MINOR REMOVAL PORT-A-CATH;  Surgeon: Jamesetta So, MD;  Location: [facility];   Service: General;  Laterality: Right;  . PORTACATH PLACEMENT  06/16/11   Done at [facility]  . TONSILLECTOMY     age 2's  . TUBAL LIGATION  1981    There were no vitals filed for this visit.  Subjective Assessment - 07/20/17 1432    Subjective   S: I've been working and doing some typing and filing. It is not bothering me.    Currently in Pain?  No/denies         [facility] OT Assessment - 07/20/17 1433      Assessment   Medical Diagnosis  Right RTC repair       Precautions   Precautions  Shoulder    Type of Shoulder Precautions  Progress as tolerated               OT Treatments/Exercises (OP) - 07/20/17 1433      Exercises   Exercises  Shoulder      Shoulder Exercises: Supine   Protraction  PROM;5 reps;AROM;15 reps    Horizontal ABduction  PROM;5 reps;AROM;15 reps    External Rotation  PROM;5 reps;AROM;15 reps    Internal Rotation  PROM;5 reps;AROM;15 reps    Flexion  PROM;5 reps;AROM;15 reps    ABduction  PROM;5 reps;AROM;15 reps  Shoulder Exercises: Standing   Protraction  AROM;15 reps    Horizontal ABduction  AROM;15 reps    External Rotation  AROM;15 reps    Internal Rotation  AROM;15 reps    Flexion  AROM;15 reps    ABduction  AROM;15 reps    Extension  Theraband;15 reps    Theraband Level (Shoulder Extension)  Level 2 (Red)    Row  Theraband;15 reps    Theraband Level (Shoulder Row)  Level 2 (Red)    Retraction  Theraband;15 reps    Theraband Level (Shoulder Retraction)  Level 2 (Red)      Shoulder Exercises: ROM/Strengthening   Proximal Shoulder Strengthening, Supine  15X each no rest breaks    Proximal Shoulder Strengthening, Seated  15X each no rest breaks      Manual Therapy   Manual Therapy  Myofascial release    Manual therapy comments  manual therapy completed seperately from all other interventions this date    Myofascial Release  myofascial release and manual stretching to right upper arm, scapular, and shoulder region to  decrease pain and restrictions and imrpove pain free mobility needed for resumption of right arm as dominant             OT Education - 07/20/17 1458    Education Details  Scapular theraband exercises added to HEP. Patient given red theraband.    Person(s) Educated  Patient    Methods  Explanation;Demonstration;Verbal cues;Handout    Comprehension  Verbalized understanding;Returned demonstration       OT Short Term Goals - 07/06/17 1458      OT SHORT TERM GOAL #1   Title  Patient will be educated and independent with HEP to faciliate progress in therapy and allow her to begin using her RUE for her daily tasks.     Time  6    Period  Weeks    Status  On-going      OT SHORT TERM GOAL #2   Title  Patient will increase P/ROM of RUE to [facility] to increase ability to complete dressing tasks with less difficulty and with independence.     Time  6    Period  Weeks    Status  On-going      OT SHORT TERM GOAL #3   Title  Patient will increase RUE strength to 3+/5 to increase ability to complete waist level household tasks and be able to return to piano playing.     Time  6    Period  Weeks    Status  On-going      OT SHORT TERM GOAL #4   Title  Patient will decrease fascial restrictions to min amount in RUE in order to increase functional mobility needed to for reaching tasks.     Time  6    Period  Weeks    Status  On-going      OT SHORT TERM GOAL #5   Title  Patient will report a pain level score of 4/10 when using RUE during daily tasks.     Time  6    Period  Weeks    Status  On-going        OT Long Term Goals - 06/10/17 1329      OT LONG TERM GOAL #1   Title  Patient will return to highest level of independence while using her RUE as her dominant extremity for all daily and work tasks.     Time  12  Period  Weeks    Status  On-going      OT LONG TERM GOAL #2   Title  Patient will increase A/ROM to WNL to increase ability to complete overhead reaching tasks with  less difficulty.     Time  12    Period  Weeks    Status  On-going      OT LONG TERM GOAL #3   Title  Patient will increase RUE strength to 4+/5 to be able to return to normal household lifting tasks.     Time  12    Period  Weeks    Status  On-going      OT LONG TERM GOAL #4   Title  Patient will decrease fascial restrictions to trace amount in RUE in order to increase functional mobility needed for overhead reaching.     Time  12    Period  Weeks    Status  On-going      OT LONG TERM GOAL #5   Title  Patient will report a decrease in pain level of 2/10 or less when completing daily tasks with her RUE.     Time  12    Period  Weeks    Status  On-going            Plan - 07/20/17 1502    Clinical Impression Statement  A: Continued with manual therapy to address fascial restrictions in RUE limiting ROM. Moderate fascial restrictions in anterior upper arm and in upper trapezius region. Patient able to increase reps for supine and standing A/ROM exercises. Patient with minimal difficulty maintianing 90 degrees flexion/abduction during horizontal abduction. Remaining scapular theraband exercises added and HEP updated to include scapular theraband exercises. Verbal cuing for form and technique during session.     Plan  P: Continue with myofascial release to address muscle knots palpated in upper trapezius and anterior upper arm. Progress to strengthening in supine only and work on improving form during abduction/horizontal abduction A/ROM in standing. Add UBE.    Consulted and Agree with Plan of Care  Patient       Patient will benefit from skilled therapeutic intervention in order to improve the following deficits and impairments:  Decreased range of motion, Pain, Impaired UE functional use, Decreased strength, Increased fascial restrictions  Visit Diagnosis: Acute pain of right shoulder  Stiffness of right shoulder, not elsewhere classified  Other symptoms and signs involving  the musculoskeletal system    Problem List Patient Active Problem List   Diagnosis Date Noted  . Vaginal Pap smear following hysterectomy for malignancy 03/02/2017  . Screening for colorectal cancer 03/02/2017  . Vaginal atrophy 03/02/2017  . History of ovarian cancer 03/02/2017  . Genetic testing 05/29/2015  . Family history of kidney cancer   . Family history of prostate cancer   . Chemotherapy-induced neuropathy ([place]) 03/31/2015  . Incisional hernia, incarcerated 10/15/2014  . Ventral hernia 07/15/2014  . Ovarian cancer on right ([place]) 06/17/2011  . Hypertension 06/17/2011  . H/O hysterectomy with oophorectomy 06/17/2011   Ailene Ravel, OTR/L,CBIS  [phone]  07/20/2017, 3:14 PM  [facility] [address] [place], [place], [address] Phone: [phone]   Fax:  [phone]  Name: Sarah Walker MRN: [id number] Date of Birth: 1950/11/23

## 2017-07-20 NOTE — Patient Instructions (Signed)
Complete 10-15 reps, 1-3 times a day.  (Home) Extension: Isometric / Bilateral Arm Retraction - Sitting   Facing anchor, hold hands and elbow at shoulder height, with elbow bent.  Pull arms back to squeeze shoulder blades together. Repeat 10-15 times. 1-3 times/day.   (Clinic) Extension / Flexion (Assist)   Face anchor, pull arms back, keeping elbow straight, and squeze shoulder blades together. Repeat 10-15 times. 1-3 times/day.   Copyright  VHI. All rights reserved.   (Home) Retraction: Row - Bilateral (Anchor)   Facing anchor, arms reaching forward, pull hands toward stomach, keeping elbows bent and at your sides and pinching shoulder blades together. Repeat 10-15 times. 1-3 times/day.   Copyright  VHI. All rights reserved.

## 2017-07-25 ENCOUNTER — Encounter (HOSPITAL_COMMUNITY): Payer: Self-pay

## 2017-07-25 ENCOUNTER — Other Ambulatory Visit: Payer: Self-pay

## 2017-07-25 ENCOUNTER — Ambulatory Visit (HOSPITAL_COMMUNITY): Payer: Medicare Other

## 2017-07-25 DIAGNOSIS — M25611 Stiffness of right shoulder, not elsewhere classified: Secondary | ICD-10-CM | POA: Diagnosis not present

## 2017-07-25 DIAGNOSIS — R29898 Other symptoms and signs involving the musculoskeletal system: Secondary | ICD-10-CM | POA: Diagnosis not present

## 2017-07-25 DIAGNOSIS — M25511 Pain in right shoulder: Secondary | ICD-10-CM | POA: Diagnosis not present

## 2017-07-25 NOTE — Therapy (Signed)
[place], [place], [address] Phone: [phone]   Fax:  [phone]  Occupational Therapy Treatment  Patient Details  Name: Sarah Walker MRN: [id number] Date of Birth: 11-18-1950 Referring Provider: Fredonia Highland, MD   Encounter Date: 07/25/2017  OT End of Session - 07/25/17 1457    Visit Number  15    Number of Visits  24    Date for OT Re-Evaluation  08/31/17 mini reassessment on 7/31    Authorization Type  1) [employer] medicare $40 copay 2) [employer] no authorization needed.     OT Start Time  1435    OT Stop Time  1514    OT Time Calculation (min)  39 min    Activity Tolerance  Patient tolerated treatment well    Behavior During Therapy  WFL for tasks assessed/performed       Past Medical History:  Diagnosis Date  . Abdominal hernia   . Allergic rhinitis   . Anemia associated with chemotherapy 09/16/2011  . Bone spur of other site    left foot  . Dry eyes   . Family history of kidney cancer   . Family history of prostate cancer   . Hypertension   . Ovarian cancer ([place])   . Ovarian cancer on right ([place]) 06/17/2011  . Peripheral neuropathy 08/26/2011   Chemotherapy-induced  . Pulmonary embolism ([place])   . Sinusitis     Past Surgical History:  Procedure Laterality Date  . ABDOMINAL HYSTERECTOMY  05/25/2011   [place] bs&o and cancer staging  . CHOLECYSTECTOMY  1990  . COLONOSCOPY N/A 09/06/2014   Procedure: COLONOSCOPY;  Surgeon: Rogene Houston, MD;  [facility];  Service: Endoscopy;  Laterality: N/A;  930 - moved to 9/2 @ 8:25  . INCISIONAL HERNIA REPAIR  10/15/2014  . INCISIONAL HERNIA REPAIR  10/15/2014   Procedure: OPEN INCISIONAL HERNIA REPAIR WITH MESH AND MYOFASCIAL FLAPS;  Surgeon: Erroll Luna, MD;  Location: [facility];  Service: General;;  . IVC filter  05/24/11  . PORT-A-CATH REMOVAL Right 11/21/2013   Procedure: MINOR REMOVAL PORT-A-CATH;  Surgeon: Jamesetta So, MD;  Location: [facility];   Service: General;  Laterality: Right;  . PORTACATH PLACEMENT  06/16/11   Done at [facility]  . TONSILLECTOMY     age 67's  . TUBAL LIGATION  1981    There were no vitals filed for this visit.  Subjective Assessment - 07/25/17 1435    Subjective   S: I'm doing all the easy exercises at home but the harder ones I tend to not do.     Currently in Pain?  No/denies         [facility] OT Assessment - 07/25/17 1435      Assessment   Medical Diagnosis  Right RTC repair       Precautions   Precautions  Shoulder    Type of Shoulder Precautions  Progress as tolerated               OT Treatments/Exercises (OP) - 07/25/17 1435      Exercises   Exercises  Shoulder      Shoulder Exercises: Supine   Protraction  PROM;5 reps;Strengthening;10 reps    Protraction Weight (lbs)  1    Horizontal ABduction  PROM;5 reps;Strengthening;10 reps    Horizontal ABduction Weight (lbs)  1    External Rotation  PROM;5 reps;Strengthening;10 reps    External Rotation Weight (lbs)  1  Internal Rotation  PROM;5 reps;Strengthening;10 reps    Internal Rotation Weight (lbs)  1    Flexion  PROM;5 reps;Strengthening;10 reps    Shoulder Flexion Weight (lbs)  1    ABduction  PROM;5 reps;Strengthening;10 reps    Shoulder ABduction Weight (lbs)  1      Shoulder Exercises: Standing   Protraction  AROM;15 reps    Horizontal ABduction  AROM;15 reps    External Rotation  AROM;15 reps    Internal Rotation  AROM;15 reps    Flexion  AROM;15 reps    ABduction  AROM;15 reps      Shoulder Exercises: ROM/Strengthening   UBE (Upper Arm Bike)  2' forward    X to V Arms  5X limited to above shoulder height due to fatigue    Proximal Shoulder Strengthening, Supine  10X; 1# no rest breaks    Proximal Shoulder Strengthening, Seated  15X each no rest breaks      Manual Therapy   Manual Therapy  Myofascial release    Manual therapy comments  manual therapy completed seperately from all other interventions  this date    Myofascial Release  myofascial release and manual stretching to right upper arm, scapular, and shoulder region to decrease pain and restrictions and imrpove pain free mobility needed for resumption of right arm as dominant               OT Short Term Goals - 07/06/17 1458      OT SHORT TERM GOAL #1   Title  Patient will be educated and independent with HEP to faciliate progress in therapy and allow her to begin using her RUE for her daily tasks.     Time  6    Period  Weeks    Status  On-going      OT SHORT TERM GOAL #2   Title  Patient will increase P/ROM of RUE to [facility] to increase ability to complete dressing tasks with less difficulty and with independence.     Time  6    Period  Weeks    Status  On-going      OT SHORT TERM GOAL #3   Title  Patient will increase RUE strength to 3+/5 to increase ability to complete waist level household tasks and be able to return to piano playing.     Time  6    Period  Weeks    Status  On-going      OT SHORT TERM GOAL #4   Title  Patient will decrease fascial restrictions to min amount in RUE in order to increase functional mobility needed to for reaching tasks.     Time  6    Period  Weeks    Status  On-going      OT SHORT TERM GOAL #5   Title  Patient will report a pain level score of 4/10 when using RUE during daily tasks.     Time  6    Period  Weeks    Status  On-going        OT Long Term Goals - 06/10/17 1329      OT LONG TERM GOAL #1   Title  Patient will return to highest level of independence while using her RUE as her dominant extremity for all daily and work tasks.     Time  12    Period  Weeks    Status  On-going      OT LONG TERM GOAL #  2   Title  Patient will increase A/ROM to WNL to increase ability to complete overhead reaching tasks with less difficulty.     Time  12    Period  Weeks    Status  On-going      OT LONG TERM GOAL #3   Title  Patient will increase RUE strength to 4+/5 to be  able to return to normal household lifting tasks.     Time  12    Period  Weeks    Status  On-going      OT LONG TERM GOAL #4   Title  Patient will decrease fascial restrictions to trace amount in RUE in order to increase functional mobility needed for overhead reaching.     Time  12    Period  Weeks    Status  On-going      OT LONG TERM GOAL #5   Title  Patient will report a decrease in pain level of 2/10 or less when completing daily tasks with her RUE.     Time  12    Period  Weeks    Status  On-going            Plan - 07/25/17 1503    Clinical Impression Statement  A: Continued with manual therapy to address fascial restrictions in RUE limiting ROM. Moderate fascial restrictions in anterior upper arm. Patient demonstrating WNL P/ROM this session with no pain. Patient able to progress to strengthening in supine demonstrating good form with exercises. Continued with A/ROM in standing to work on form. Patient demonstrating improved form this session and requiring occasional rest breaks due to fatigue. X to V arms and UBE added. Patient demonstrating moderate difficulty with raising arms during X to V arms exercise. Verbal cuing for form and technique during session.     Plan  P: Continue with myofascial release to address muscle knots palpated in anterior upper arm. Progress to strengthening in standing. Add ball on the wall and UBE in reverse.    Consulted and Agree with Plan of Care  Patient       Patient will benefit from skilled therapeutic intervention in order to improve the following deficits and impairments:  Decreased range of motion, Pain, Impaired UE functional use, Decreased strength, Increased fascial restrictions  Visit Diagnosis: Acute pain of right shoulder  Stiffness of right shoulder, not elsewhere classified  Other symptoms and signs involving the musculoskeletal system    Problem List Patient Active Problem List   Diagnosis Date Noted  . Vaginal Pap  smear following hysterectomy for malignancy 03/02/2017  . Screening for colorectal cancer 03/02/2017  . Vaginal atrophy 03/02/2017  . History of ovarian cancer 03/02/2017  . Genetic testing 05/29/2015  . Family history of kidney cancer   . Family history of prostate cancer   . Chemotherapy-induced neuropathy ([place]) 03/31/2015  . Incisional hernia, incarcerated 10/15/2014  . Ventral hernia 07/15/2014  . Ovarian cancer on right ([place]) 06/17/2011  . Hypertension 06/17/2011  . H/O hysterectomy with oophorectomy 06/17/2011   Ailene Ravel, OTR/L,CBIS  [phone]  07/26/2017, 8:10 AM  [place], [place], [address] Phone: [phone]   Fax:  [phone]  Name: CHAI VERDEJO MRN: [id number] Date of Birth: [birth year]

## 2017-07-27 ENCOUNTER — Encounter (HOSPITAL_COMMUNITY): Payer: Self-pay

## 2017-07-27 ENCOUNTER — Other Ambulatory Visit: Payer: Self-pay

## 2017-07-27 ENCOUNTER — Ambulatory Visit (HOSPITAL_COMMUNITY): Payer: Medicare Other

## 2017-07-27 DIAGNOSIS — M25511 Pain in right shoulder: Secondary | ICD-10-CM

## 2017-07-27 DIAGNOSIS — M25611 Stiffness of right shoulder, not elsewhere classified: Secondary | ICD-10-CM

## 2017-07-27 DIAGNOSIS — R29898 Other symptoms and signs involving the musculoskeletal system: Secondary | ICD-10-CM

## 2017-07-27 NOTE — Therapy (Signed)
[place], [place], [address] Phone: [phone]   Fax:  [phone]  Occupational Therapy Treatment  Patient Details  Name: Sarah Walker MRN: [id number] Date of Birth: 10-28-1950 Referring Provider: Fredonia Highland, MD   Encounter Date: 07/27/2017  OT End of Session - 07/27/17 1500    Visit Number  16    Number of Visits  24    Date for OT Re-Evaluation  08/31/17 mini reassessment on 7/31    Authorization Type  1) [employer] medicare $40 copay 2) [employer] no authorization needed.     OT Start Time  1434    OT Stop Time  1514    OT Time Calculation (min)  40 min    Activity Tolerance  Patient tolerated treatment well    Behavior During Therapy  WFL for tasks assessed/performed       Past Medical History:  Diagnosis Date  . Abdominal hernia   . Allergic rhinitis   . Anemia associated with chemotherapy 09/16/2011  . Bone spur of other site    left foot  . Dry eyes   . Family history of kidney cancer   . Family history of prostate cancer   . Hypertension   . Ovarian cancer ([place])   . Ovarian cancer on right ([place]) 06/17/2011  . Peripheral neuropathy 08/26/2011   Chemotherapy-induced  . Pulmonary embolism ([place])   . Sinusitis     Past Surgical History:  Procedure Laterality Date  . ABDOMINAL HYSTERECTOMY  05/25/2011   [place] bs&o and cancer staging  . CHOLECYSTECTOMY  1990  . COLONOSCOPY N/A 09/06/2014   Procedure: COLONOSCOPY;  Surgeon: Rogene Houston, MD;  [facility];  Service: Endoscopy;  Laterality: N/A;  930 - moved to 9/2 @ 8:25  . INCISIONAL HERNIA REPAIR  10/15/2014  . INCISIONAL HERNIA REPAIR  10/15/2014   Procedure: OPEN INCISIONAL HERNIA REPAIR WITH MESH AND MYOFASCIAL FLAPS;  Surgeon: Erroll Luna, MD;  Location: [facility];  Service: General;;  . IVC filter  05/24/11  . PORT-A-CATH REMOVAL Right 11/21/2013   Procedure: MINOR REMOVAL PORT-A-CATH;  Surgeon: Jamesetta So, MD;  Location: [facility];   Service: General;  Laterality: Right;  . PORTACATH PLACEMENT  06/16/11   Done at [facility]  . TONSILLECTOMY     age 63's  . TUBAL LIGATION  1981    There were no vitals filed for this visit.  Subjective Assessment - 07/27/17 1456    Subjective   S: I've been using 2 cans of [employer] soup at home and doing all the exercises.    Currently in Pain?  No/denies         [facility] OT Assessment - 07/27/17 1433      Assessment   Medical Diagnosis  Right RTC repair       Precautions   Precautions  Shoulder    Type of Shoulder Precautions  Progress as tolerated               OT Treatments/Exercises (OP) - 07/27/17 1433      Exercises   Exercises  Shoulder      Shoulder Exercises: Supine   Protraction  PROM;5 reps;Strengthening;10 reps    Protraction Weight (lbs)  1    Horizontal ABduction  PROM;5 reps;Strengthening;10 reps    Horizontal ABduction Weight (lbs)  1    External Rotation  PROM;5 reps;Strengthening;10 reps    External Rotation Weight (lbs)  1  Internal Rotation  PROM;5 reps;Strengthening;10 reps    Internal Rotation Weight (lbs)  1    Flexion  PROM;5 reps;Strengthening;10 reps    Shoulder Flexion Weight (lbs)  1    ABduction  PROM;5 reps;Strengthening;10 reps    Shoulder ABduction Weight (lbs)  1      Shoulder Exercises: Standing   Protraction  Strengthening;10 reps    Protraction Weight (lbs)  1    Horizontal ABduction  Strengthening;10 reps    Horizontal ABduction Weight (lbs)  1    External Rotation  Strengthening;10 reps    External Rotation Weight (lbs)  1    Internal Rotation  Strengthening;10 reps    Internal Rotation Weight (lbs)  1    Flexion  Strengthening;10 reps    Shoulder Flexion Weight (lbs)  1    ABduction  Strengthening;10 reps    Shoulder ABduction Weight (lbs)  1      Shoulder Exercises: ROM/Strengthening   UBE (Upper Arm Bike)  2' forward and 2' reverse    X to V Arms  10X with only RUE    Proximal Shoulder  Strengthening, Supine  10X; 1#    Proximal Shoulder Strengthening, Seated  10X; 1#    Ball on Wall  1' flexion      Manual Therapy   Manual Therapy  Myofascial release    Manual therapy comments  manual therapy completed seperately from all other interventions this date    Myofascial Release  myofascial release and manual stretching to right upper arm, scapular, and shoulder region to decrease pain and restrictions and imrpove pain free mobility needed for resumption of right arm as dominant             OT Education - 07/27/17 1457    Education Details  Patient instructed to begin shoulder strengthening in standing as part if HEP. Patient instructed that she can use a full water bottle or can of soup as a substitute for 1# weight.    Person(s) Educated  Patient    Methods  Explanation;Demonstration;Verbal cues    Comprehension  Verbalized understanding;Returned demonstration       OT Short Term Goals - 07/06/17 1458      OT SHORT TERM GOAL #1   Title  Patient will be educated and independent with HEP to faciliate progress in therapy and allow her to begin using her RUE for her daily tasks.     Time  6    Period  Weeks    Status  On-going      OT SHORT TERM GOAL #2   Title  Patient will increase P/ROM of RUE to [facility] to increase ability to complete dressing tasks with less difficulty and with independence.     Time  6    Period  Weeks    Status  On-going      OT SHORT TERM GOAL #3   Title  Patient will increase RUE strength to 3+/5 to increase ability to complete waist level household tasks and be able to return to piano playing.     Time  6    Period  Weeks    Status  On-going      OT SHORT TERM GOAL #4   Title  Patient will decrease fascial restrictions to min amount in RUE in order to increase functional mobility needed to for reaching tasks.     Time  6    Period  Weeks    Status  On-going  OT SHORT TERM GOAL #5   Title  Patient will report a pain level score  of 4/10 when using RUE during daily tasks.     Time  6    Period  Weeks    Status  On-going        OT Long Term Goals - 06/10/17 1329      OT LONG TERM GOAL #1   Title  Patient will return to highest level of independence while using her RUE as her dominant extremity for all daily and work tasks.     Time  12    Period  Weeks    Status  On-going      OT LONG TERM GOAL #2   Title  Patient will increase A/ROM to WNL to increase ability to complete overhead reaching tasks with less difficulty.     Time  12    Period  Weeks    Status  On-going      OT LONG TERM GOAL #3   Title  Patient will increase RUE strength to 4+/5 to be able to return to normal household lifting tasks.     Time  12    Period  Weeks    Status  On-going      OT LONG TERM GOAL #4   Title  Patient will decrease fascial restrictions to trace amount in RUE in order to increase functional mobility needed for overhead reaching.     Time  12    Period  Weeks    Status  On-going      OT LONG TERM GOAL #5   Title  Patient will report a decrease in pain level of 2/10 or less when completing daily tasks with her RUE.     Time  12    Period  Weeks    Status  On-going            Plan - 07/27/17 1505    Clinical Impression Statement  A: Continued with manual therapy to address fascial restrictions in RUE limiting ROM. Moderate fascial restrictions in anterior upper arm. Patient demonstrating WNL P/ROM this session with no pain. Patient able to progress to strengthening in standing this session. Patient with max difficulty and VC to raise arm past 90 degrees flexion as strengthening exercises progressed due to fatigue. Ball on the wall in flexion and UBE in reverse added this session. Moderate verbal cuing for form and technique during session.     Plan  P: Continue with myofascial release to address muscle knots palpated in anterior upper arm. Increase reps for supine strengthening and work on improving form during  standing strengthening exercises. Add ball on the wall in abduction.    Consulted and Agree with Plan of Care  Patient       Patient will benefit from skilled therapeutic intervention in order to improve the following deficits and impairments:  Decreased range of motion, Pain, Impaired UE functional use, Decreased strength, Increased fascial restrictions  Visit Diagnosis: Acute pain of right shoulder  Stiffness of right shoulder, not elsewhere classified  Other symptoms and signs involving the musculoskeletal system    Problem List Patient Active Problem List   Diagnosis Date Noted  . Vaginal Pap smear following hysterectomy for malignancy 03/02/2017  . Screening for colorectal cancer 03/02/2017  . Vaginal atrophy 03/02/2017  . History of ovarian cancer 03/02/2017  . Genetic testing 05/29/2015  . Family history of kidney cancer   . Family history of prostate cancer   .  Chemotherapy-induced neuropathy ([place]) 03/31/2015  . Incisional hernia, incarcerated 10/15/2014  . Ventral hernia 07/15/2014  . Ovarian cancer on right ([place]) 06/17/2011  . Hypertension 06/17/2011  . H/O hysterectomy with oophorectomy 06/17/2011   Ailene Ravel, OTR/L,CBIS  [phone]  07/27/2017, 3:31 PM  [facility] [address] [place], [place], [address] Phone: [phone]   Fax:  [phone]  Name: ADY HEIMANN MRN: [id number] Date of Birth: [birth year]

## 2017-08-01 ENCOUNTER — Encounter (HOSPITAL_COMMUNITY): Payer: Self-pay

## 2017-08-01 ENCOUNTER — Ambulatory Visit (HOSPITAL_COMMUNITY): Payer: Medicare Other

## 2017-08-01 ENCOUNTER — Other Ambulatory Visit: Payer: Self-pay

## 2017-08-01 DIAGNOSIS — R29898 Other symptoms and signs involving the musculoskeletal system: Secondary | ICD-10-CM

## 2017-08-01 DIAGNOSIS — M25611 Stiffness of right shoulder, not elsewhere classified: Secondary | ICD-10-CM

## 2017-08-01 DIAGNOSIS — M25511 Pain in right shoulder: Secondary | ICD-10-CM | POA: Diagnosis not present

## 2017-08-01 NOTE — Therapy (Signed)
[facility] [place], [place], [address] Phone: [phone]   Fax:  [phone]  Occupational Therapy Treatment  Patient Details  Name: Sarah Walker MRN: [id number] Date of Birth: 11/27/1950 Referring Provider: Fredonia Highland, MD   Encounter Date: 08/01/2017  OT End of Session - 08/01/17 1453    Visit Number  17    Number of Visits  24    Date for OT Re-Evaluation  08/31/17 mini reassessment on 7/31    Authorization Type  1) [employer] medicare $40 copay 2) [employer] no authorization needed.     OT Start Time  1432    OT Stop Time  1512    OT Time Calculation (min)  40 min    Activity Tolerance  Patient tolerated treatment well    Behavior During Therapy  WFL for tasks assessed/performed       Past Medical History:  Diagnosis Date  . Abdominal hernia   . Allergic rhinitis   . Anemia associated with chemotherapy 09/16/2011  . Bone spur of other site    left foot  . Dry eyes   . Family history of kidney cancer   . Family history of prostate cancer   . Hypertension   . Ovarian cancer ([place])   . Ovarian cancer on right ([place]) 06/17/2011  . Peripheral neuropathy 08/26/2011   Chemotherapy-induced  . Pulmonary embolism ([place])   . Sinusitis     Past Surgical History:  Procedure Laterality Date  . ABDOMINAL HYSTERECTOMY  05/25/2011   [place] bs&o and cancer staging  . CHOLECYSTECTOMY  1990  . COLONOSCOPY N/A 09/06/2014   Procedure: COLONOSCOPY;  Surgeon: Rogene Houston, MD;  [facility];  Service: Endoscopy;  Laterality: N/A;  930 - moved to 9/2 @ 8:25  . INCISIONAL HERNIA REPAIR  10/15/2014  . INCISIONAL HERNIA REPAIR  10/15/2014   Procedure: OPEN INCISIONAL HERNIA REPAIR WITH MESH AND MYOFASCIAL FLAPS;  Surgeon: Erroll Luna, MD;  Location: [facility];  Service: General;;  . IVC filter  05/24/11  . PORT-A-CATH REMOVAL Right 11/21/2013   Procedure: MINOR REMOVAL PORT-A-CATH;  Surgeon: Jamesetta So, MD;  Location: [facility];   Service: General;  Laterality: Right;  . PORTACATH PLACEMENT  06/16/11   Done at [facility]  . TONSILLECTOMY     age 67's  . TUBAL LIGATION  1981    There were no vitals filed for this visit.  Subjective Assessment - 08/01/17 1431    Subjective   S: I pushed a shopping cart around for a while yesterday but then the arm got sore and I had to stop.    Currently in Pain?  No/denies         [facility] OT Assessment - 08/01/17 1431      Assessment   Medical Diagnosis  Right RTC repair       Precautions   Precautions  Shoulder    Type of Shoulder Precautions  Progress as tolerated               OT Treatments/Exercises (OP) - 08/01/17 1431      Exercises   Exercises  Shoulder      Shoulder Exercises: Supine   Protraction  PROM;5 reps;Strengthening;10 reps    Protraction Weight (lbs)  1    Horizontal ABduction  PROM;5 reps;Strengthening;10 reps    Horizontal ABduction Weight (lbs)  1    External Rotation  PROM;5 reps;Strengthening;10 reps    External Rotation Weight (  lbs)  1    Internal Rotation  PROM;5 reps;Strengthening;10 reps    Internal Rotation Weight (lbs)  1    Flexion  PROM;5 reps;Strengthening;10 reps    Shoulder Flexion Weight (lbs)  1    ABduction  PROM;5 reps;Strengthening;10 reps    Shoulder ABduction Weight (lbs)  1      Shoulder Exercises: Standing   Protraction  Strengthening;10 reps    Protraction Weight (lbs)  1    Horizontal ABduction  Strengthening;10 reps    Horizontal ABduction Weight (lbs)  1    External Rotation  Strengthening;10 reps    External Rotation Weight (lbs)  1    Internal Rotation  Strengthening;10 reps    Internal Rotation Weight (lbs)  1    Flexion  Strengthening;10 reps    Shoulder Flexion Weight (lbs)  1    ABduction  Strengthening;10 reps    Shoulder ABduction Weight (lbs)  1      Shoulder Exercises: ROM/Strengthening   X to V Arms  10X with only RUE    Proximal Shoulder Strengthening, Supine  10X; 1#    Proximal  Shoulder Strengthening, Seated  10X; 1#    Ball on Wall  1' flexion and 1' abduction      Manual Therapy   Manual Therapy  Myofascial release    Manual therapy comments  manual therapy completed seperately from all other interventions this date    Myofascial Release  myofascial release and manual stretching to right upper arm, scapular, and shoulder region to decrease pain and restrictions and imrpove pain free mobility needed for resumption of right arm as dominant               OT Short Term Goals - 07/06/17 1458      OT SHORT TERM GOAL #1   Title  Patient will be educated and independent with HEP to faciliate progress in therapy and allow her to begin using her RUE for her daily tasks.     Time  6    Period  Weeks    Status  On-going      OT SHORT TERM GOAL #2   Title  Patient will increase P/ROM of RUE to [facility] to increase ability to complete dressing tasks with less difficulty and with independence.     Time  6    Period  Weeks    Status  On-going      OT SHORT TERM GOAL #3   Title  Patient will increase RUE strength to 3+/5 to increase ability to complete waist level household tasks and be able to return to piano playing.     Time  6    Period  Weeks    Status  On-going      OT SHORT TERM GOAL #4   Title  Patient will decrease fascial restrictions to min amount in RUE in order to increase functional mobility needed to for reaching tasks.     Time  6    Period  Weeks    Status  On-going      OT SHORT TERM GOAL #5   Title  Patient will report a pain level score of 4/10 when using RUE during daily tasks.     Time  6    Period  Weeks    Status  On-going        OT Long Term Goals - 06/10/17 1329      OT LONG TERM GOAL #1   Title  Patient will return to highest level of independence while using her RUE as her dominant extremity for all daily and work tasks.     Time  12    Period  Weeks    Status  On-going      OT LONG TERM GOAL #2   Title  Patient will  increase A/ROM to WNL to increase ability to complete overhead reaching tasks with less difficulty.     Time  12    Period  Weeks    Status  On-going      OT LONG TERM GOAL #3   Title  Patient will increase RUE strength to 4+/5 to be able to return to normal household lifting tasks.     Time  12    Period  Weeks    Status  On-going      OT LONG TERM GOAL #4   Title  Patient will decrease fascial restrictions to trace amount in RUE in order to increase functional mobility needed for overhead reaching.     Time  12    Period  Weeks    Status  On-going      OT LONG TERM GOAL #5   Title  Patient will report a decrease in pain level of 2/10 or less when completing daily tasks with her RUE.     Time  12    Period  Weeks    Status  On-going            Plan - 08/01/17 1458    Clinical Impression Statement  A: Continued with manual therapy to address fascial restrictions in RUE limiting ROM. Minimal fascial restrictions in anterior upper arm. Patient demonstrating WNL P/ROM this session with no pain. Patient continued with strengthening in supine and standing this session demonstrating improving form and ROM with exercises. Standing abduction and horizontal abduction performed first to decrease fatigue. Patient still demonstrating moderate difficulty with maintaining 90 degrees flexion during horizontal abduction exercises. Ball on the wall in abduction added. Moderate VC for technique.    Plan  P: Complete mini reassessment. Continue with myofascial release to address muscle knots palpated in anterior upper arm. Increase reps for supine and standing strengthening as tolerated. Continue working on improving form with standing shoulder strengthening exercises.    Consulted and Agree with Plan of Care  Patient       Patient will benefit from skilled therapeutic intervention in order to improve the following deficits and impairments:  Decreased range of motion, Pain, Impaired UE functional  use, Decreased strength, Increased fascial restrictions  Visit Diagnosis: Acute pain of right shoulder  Stiffness of right shoulder, not elsewhere classified  Other symptoms and signs involving the musculoskeletal system    Problem List Patient Active Problem List   Diagnosis Date Noted  . Vaginal Pap smear following hysterectomy for malignancy 03/02/2017  . Screening for colorectal cancer 03/02/2017  . Vaginal atrophy 03/02/2017  . History of ovarian cancer 03/02/2017  . Genetic testing 05/29/2015  . Family history of kidney cancer   . Family history of prostate cancer   . Chemotherapy-induced neuropathy ([place]) 03/31/2015  . Incisional hernia, incarcerated 10/15/2014  . Ventral hernia 07/15/2014  . Ovarian cancer on right ([place]) 06/17/2011  . Hypertension 06/17/2011  . H/O hysterectomy with oophorectomy 06/17/2011   Ailene Ravel, OTR/L,CBIS  [phone]  08/01/2017, 3:14 PM  [facility] [place], [place], [address] Phone: [phone]   Fax:  [phone]  Name: Sarah Walker MRN: [id number] Date of Birth: 1950/08/05

## 2017-08-03 ENCOUNTER — Ambulatory Visit (HOSPITAL_COMMUNITY): Payer: Medicare Other

## 2017-08-03 ENCOUNTER — Encounter (HOSPITAL_COMMUNITY): Payer: Self-pay

## 2017-08-03 ENCOUNTER — Other Ambulatory Visit: Payer: Self-pay

## 2017-08-03 DIAGNOSIS — R29898 Other symptoms and signs involving the musculoskeletal system: Secondary | ICD-10-CM

## 2017-08-03 DIAGNOSIS — M25611 Stiffness of right shoulder, not elsewhere classified: Secondary | ICD-10-CM | POA: Diagnosis not present

## 2017-08-03 DIAGNOSIS — M25511 Pain in right shoulder: Secondary | ICD-10-CM

## 2017-08-03 NOTE — Therapy (Addendum)
[place], [place], [address] Phone: [phone]   Fax:  [phone]  Occupational Therapy Treatment  Patient Details  Name: Sarah Walker MRN: [id number] Date of Birth: May 23, 1950 Referring Provider: Fredonia Highland, MD   Progress Note Reporting Period 07/06/17 to 08/03/17  See note below for Objective Data and Assessment of Progress/Goals.       Encounter Date: 08/03/2017  OT End of Session - 08/03/17 1505    Visit Number  18    Number of Visits  24    Date for OT Re-Evaluation  08/31/17    Authorization Type  1) [employer] medicare $40 copay 2) [employer] no authorization needed.     OT Start Time  1432    OT Stop Time  1512    OT Time Calculation (min)  40 min    Activity Tolerance  Patient tolerated treatment well    Behavior During Therapy  WFL for tasks assessed/performed       Past Medical History:  Diagnosis Date  . Abdominal hernia   . Allergic rhinitis   . Anemia associated with chemotherapy 09/16/2011  . Bone spur of other site    left foot  . Dry eyes   . Family history of kidney cancer   . Family history of prostate cancer   . Hypertension   . Ovarian cancer ([place])   . Ovarian cancer on right ([place]) 06/17/2011  . Peripheral neuropathy 08/26/2011   Chemotherapy-induced  . Pulmonary embolism ([place])   . Sinusitis     Past Surgical History:  Procedure Laterality Date  . ABDOMINAL HYSTERECTOMY  05/25/2011   [place] bs&o and cancer staging  . CHOLECYSTECTOMY  1990  . COLONOSCOPY N/A 09/06/2014   Procedure: COLONOSCOPY;  Surgeon: Rogene Houston, MD;  [facility];  Service: Endoscopy;  Laterality: N/A;  930 - moved to 9/2 @ 8:25  . INCISIONAL HERNIA REPAIR  10/15/2014  . INCISIONAL HERNIA REPAIR  10/15/2014   Procedure: OPEN INCISIONAL HERNIA REPAIR WITH MESH AND MYOFASCIAL FLAPS;  Surgeon: Erroll Luna, MD;  Location: [facility];  Service: General;;  . IVC filter  05/24/11  . PORT-A-CATH REMOVAL Right  11/21/2013   Procedure: MINOR REMOVAL PORT-A-CATH;  Surgeon: Jamesetta So, MD;  Location: [facility];  Service: General;  Laterality: Right;  . PORTACATH PLACEMENT  06/16/11   Done at [facility]  . TONSILLECTOMY     age 10's  . TUBAL LIGATION  1981    There were no vitals filed for this visit.  Subjective Assessment - 08/03/17 1432    Subjective   S: After last session it was sore for a couple days so I didn't do all the exercises.    Currently in Pain?  No/denies         [facility] OT Assessment - 08/03/17 1433      Assessment   Medical Diagnosis  Right RTC repair       Precautions   Precautions  Shoulder    Type of Shoulder Precautions  Progress as tolerated      Observation/Other Assessments   Focus on Therapeutic Outcomes (FOTO)   61/100 previous: 4/100      ROM / Strength   AROM / PROM / Strength  AROM;PROM;Strength      Palpation   Palpation comment  Minimal fascial restrictions in anterior forearm      AROM   Overall AROM Comments  assessed seated; IR/er adducted  AROM Assessment Site  Shoulder    Right/Left Shoulder  Right    Right Shoulder Flexion  167 Degrees previous: 145    Right Shoulder ABduction  180 Degrees same as previous    Right Shoulder Internal Rotation  90 Degrees same as previous    Right Shoulder External Rotation  90 Degrees previous: 70      PROM   Overall PROM Comments  assessed supine; IR/er adducted    PROM Assessment Site  Shoulder    Right/Left Shoulder  Right    Right Shoulder Flexion  170 Degrees previous: 150    Right Shoulder ABduction  180 Degrees same as previous    Right Shoulder Internal Rotation  90 Degrees same as previous    Right Shoulder External Rotation  85 Degrees previous: 70      Strength   Overall Strength Comments  assessed seated; IR/er adducted    Strength Assessment Site  Shoulder    Right/Left Shoulder  Right    Right Shoulder Flexion  3+/5    Right Shoulder ABduction  3+/5    Right Shoulder Internal  Rotation  4/5    Right Shoulder External Rotation  4+/5               OT Treatments/Exercises (OP) - 08/03/17 1433      Exercises   Exercises  Shoulder      Shoulder Exercises: Standing   Protraction  Strengthening;10 reps    Protraction Weight (lbs)  1    Horizontal ABduction  Strengthening;10 reps    Horizontal ABduction Weight (lbs)  1    External Rotation  Strengthening;10 reps    External Rotation Weight (lbs)  1    Internal Rotation  Strengthening;10 reps    Internal Rotation Weight (lbs)  1    Flexion  Strengthening;10 reps    Shoulder Flexion Weight (lbs)  1    ABduction  Strengthening;10 reps    Shoulder ABduction Weight (lbs)  1      Shoulder Exercises: ROM/Strengthening   X to V Arms  15X; 1# RUE only    Proximal Shoulder Strengthening, Supine  15X; 1#    Proximal Shoulder Strengthening, Seated  15X; 1#      Manual Therapy   Manual Therapy  Myofascial release    Manual therapy comments  manual therapy completed seperately from all other interventions this date    Myofascial Release  myofascial release and manual stretching to right upper arm, scapular, and shoulder region to decrease pain and restrictions and imrpove pain free mobility needed for resumption of right arm as dominant             OT Education - 08/03/17 1434    Education Details  Reviewed progress towards goals with patient.    Person(s) Educated  Patient    Methods  Explanation;Verbal cues    Comprehension  Verbalized understanding       OT Short Term Goals - 08/03/17 1452      OT SHORT TERM GOAL #1   Title  Patient will be educated and independent with HEP to faciliate progress in therapy and allow her to begin using her RUE for her daily tasks.     Time  6    Period  Weeks    Status  Achieved      OT SHORT TERM GOAL #2   Title  Patient will increase P/ROM of RUE to [facility] to increase ability to complete dressing tasks with less  difficulty and with independence.     Time  6     Period  Weeks    Status  Achieved      OT SHORT TERM GOAL #3   Title  Patient will increase RUE strength to 3+/5 to increase ability to complete waist level household tasks and be able to return to piano playing.     Time  6    Period  Weeks    Status  Achieved      OT SHORT TERM GOAL #4   Title  Patient will decrease fascial restrictions to min amount in RUE in order to increase functional mobility needed to for reaching tasks.     Time  6    Period  Weeks    Status  Achieved      OT SHORT TERM GOAL #5   Title  Patient will report a pain level score of 4/10 when using RUE during daily tasks.     Time  6    Period  Weeks    Status  Achieved        OT Long Term Goals - 08/03/17 1455      OT LONG TERM GOAL #1   Title  Patient will return to highest level of independence while using her RUE as her dominant extremity for all daily and work tasks.     Time  12    Period  Weeks    Status  On-going      OT LONG TERM GOAL #2   Title  Patient will increase A/ROM to WNL to increase ability to complete overhead reaching tasks with less difficulty.     Time  12    Period  Weeks    Status  Achieved      OT LONG TERM GOAL #3   Title  Patient will increase RUE strength to 4+/5 to be able to return to normal household lifting tasks.     Time  12    Period  Weeks    Status  On-going      OT LONG TERM GOAL #4   Title  Patient will decrease fascial restrictions to trace amount in RUE in order to increase functional mobility needed for overhead reaching.     Time  12    Period  Weeks    Status  On-going      OT LONG TERM GOAL #5   Title  Patient will report a decrease in pain level of 2/10 or less when completing daily tasks with her RUE.     Time  12    Period  Weeks    Status  On-going            Plan - 08/03/17 1509    Clinical Impression Statement  A: Continued with manual therapy to address fascial restrictions in RUE limiting ROM. Minimal fascial restrictions in  anterior upper arm. Mini reassessment performed. Patient is demonstrating improved P/ROM, A/ROM, and strength. Patient still presents with fascial restrictions and decreased strength. She reports continued difficulty with lifting objects at home but is able to reach up into cabinets now. All short term goals met and 1 long term goal met for A/ROM. Patient continued with shoulder strengthening in standing this session. Patient demonstrating decreased form and technique towards the end of exercises due to fatigue. VC for technique.    Plan  P: Continue with myofascial release to address muscle knots palpated in anterior upper arm. Increase reps for supine  and standing strengthening as tolerated. Continue working on improving form with standing shoulder strengthening exercises.    Consulted and Agree with Plan of Care  Patient       Patient will benefit from skilled therapeutic intervention in order to improve the following deficits and impairments:  Decreased range of motion, Pain, Impaired UE functional use, Decreased strength, Increased fascial restrictions  Visit Diagnosis: Acute pain of right shoulder  Stiffness of right shoulder, not elsewhere classified  Other symptoms and signs involving the musculoskeletal system    Problem List Patient Active Problem List   Diagnosis Date Noted  . Vaginal Pap smear following hysterectomy for malignancy 03/02/2017  . Screening for colorectal cancer 03/02/2017  . Vaginal atrophy 03/02/2017  . History of ovarian cancer 03/02/2017  . Genetic testing 05/29/2015  . Family history of kidney cancer   . Family history of prostate cancer   . Chemotherapy-induced neuropathy ([place]) 03/31/2015  . Incisional hernia, incarcerated 10/15/2014  . Ventral hernia 07/15/2014  . Ovarian cancer on right ([place]) 06/17/2011  . Hypertension 06/17/2011  . H/O hysterectomy with oophorectomy 06/17/2011   Ailene Ravel, OTR/L,CBIS  [phone]  08/03/2017, 3:15  PM  [facility] [address] [place], [place], [address] Phone: [phone]   Fax:  [phone]  Name: Sarah Walker MRN: [id number] Date of Birth: [birth year]

## 2017-08-08 ENCOUNTER — Encounter (HOSPITAL_COMMUNITY): Payer: Self-pay

## 2017-08-08 ENCOUNTER — Ambulatory Visit (HOSPITAL_COMMUNITY): Payer: Medicare Other | Attending: Orthopedic Surgery

## 2017-08-08 DIAGNOSIS — M25511 Pain in right shoulder: Secondary | ICD-10-CM | POA: Insufficient documentation

## 2017-08-08 DIAGNOSIS — R29898 Other symptoms and signs involving the musculoskeletal system: Secondary | ICD-10-CM | POA: Insufficient documentation

## 2017-08-08 DIAGNOSIS — M25611 Stiffness of right shoulder, not elsewhere classified: Secondary | ICD-10-CM

## 2017-08-08 NOTE — Therapy (Signed)
[place], [place], [address] Phone: [phone]   Fax:  [phone]  Occupational Therapy Treatment  Patient Details  Name: Sarah Walker MRN: [id number] Date of Birth: [birth year] Referring Provider: Fredonia Highland, MD   Encounter Date: 08/08/2017  OT End of Session - 08/08/17 1510    Visit Number  19    Number of Visits  24    Date for OT Re-Evaluation  08/31/17    Authorization Type  1) [employer] medicare $40 copay 2) [employer] no authorization needed.     OT Start Time  1435    OT Stop Time  1513    OT Time Calculation (min)  38 min    Activity Tolerance  Patient tolerated treatment well    Behavior During Therapy  WFL for tasks assessed/performed       Past Medical History:  Diagnosis Date  . Abdominal hernia   . Allergic rhinitis   . Anemia associated with chemotherapy 09/16/2011  . Bone spur of other site    left foot  . Dry eyes   . Family history of kidney cancer   . Family history of prostate cancer   . Hypertension   . Ovarian cancer ([place])   . Ovarian cancer on right ([place]) 06/17/2011  . Peripheral neuropathy 08/26/2011   Chemotherapy-induced  . Pulmonary embolism ([place])   . Sinusitis     Past Surgical History:  Procedure Laterality Date  . ABDOMINAL HYSTERECTOMY  05/25/2011   [place] bs&o and cancer staging  . CHOLECYSTECTOMY  1990  . COLONOSCOPY N/A 09/06/2014   Procedure: COLONOSCOPY;  Surgeon: Rogene Houston, MD;  [facility];  Service: Endoscopy;  Laterality: N/A;  930 - moved to 9/2 @ 8:25  . INCISIONAL HERNIA REPAIR  10/15/2014  . INCISIONAL HERNIA REPAIR  10/15/2014   Procedure: OPEN INCISIONAL HERNIA REPAIR WITH MESH AND MYOFASCIAL FLAPS;  Surgeon: Erroll Luna, MD;  Location: [facility];  Service: General;;  . IVC filter  05/24/11  . PORT-A-CATH REMOVAL Right 11/21/2013   Procedure: MINOR REMOVAL PORT-A-CATH;  Surgeon: Jamesetta So, MD;  Location: [facility];  Service: General;  Laterality:  Right;  . PORTACATH PLACEMENT  06/16/11   Done at [facility]  . TONSILLECTOMY     age 29's  . TUBAL LIGATION  1981    There were no vitals filed for this visit.  Subjective Assessment - 08/08/17 1450    Subjective   S: I'm sore but I have no pain.    Currently in Pain?  No/denies         [facility] OT Assessment - 08/08/17 1451      Assessment   Medical Diagnosis  Right RTC repair       Precautions   Precautions  Shoulder    Type of Shoulder Precautions  Progress as tolerated               OT Treatments/Exercises (OP) - 08/08/17 1452      Exercises   Exercises  Shoulder      Shoulder Exercises: Supine   Protraction  PROM;5 reps;Strengthening;12 reps    Protraction Weight (lbs)  1    Horizontal ABduction  PROM;5 reps;Strengthening;12 reps    Horizontal ABduction Weight (lbs)  1    External Rotation  PROM;5 reps;Strengthening;12 reps    External Rotation Weight (lbs)  1    Internal Rotation  PROM;5 reps;Strengthening;12 reps    Internal Rotation Weight (  lbs)  1    Flexion  PROM;5 reps;Strengthening;12 reps    Shoulder Flexion Weight (lbs)  1    ABduction  PROM;5 reps;Strengthening;10 reps    Shoulder ABduction Weight (lbs)  1      Shoulder Exercises: Standing   Protraction  Strengthening;12 reps    Protraction Weight (lbs)  1    Horizontal ABduction  Strengthening;12 reps    Horizontal ABduction Weight (lbs)  1    External Rotation  Strengthening;12 reps    External Rotation Weight (lbs)  1    Internal Rotation  Strengthening;12 reps    Internal Rotation Weight (lbs)  1    Flexion  Strengthening;12 reps    Shoulder Flexion Weight (lbs)  1    ABduction  Strengthening;12 reps    Shoulder ABduction Weight (lbs)  1    Extension  Theraband;15 reps    Theraband Level (Shoulder Extension)  Level 2 (Red)    Row  Theraband;15 reps    Theraband Level (Shoulder Row)  Level 2 (Red)    Retraction  Theraband;15 reps    Theraband Level (Shoulder Retraction)   Level 2 (Red)      Shoulder Exercises: ROM/Strengthening   UBE (Upper Arm Bike)  level 1 3' forward and 3' reverse pace: 3.5-4.0    X to V Arms  12X; 1# RUE only    Proximal Shoulder Strengthening, Supine  15X; 1#    Proximal Shoulder Strengthening, Seated  15X; 1#      Manual Therapy   Manual Therapy  Myofascial release    Manual therapy comments  manual therapy completed seperately from all other interventions this date    Myofascial Release  myofascial release and manual stretching to right upper arm, scapular, and shoulder region to decrease pain and restrictions and imrpove pain free mobility needed for resumption of right arm as dominant               OT Short Term Goals - 08/08/17 1450      OT SHORT TERM GOAL #1   Title  Patient will be educated and independent with HEP to faciliate progress in therapy and allow her to begin using her RUE for her daily tasks.     Time  6    Period  Weeks      OT SHORT TERM GOAL #2   Title  Patient will increase P/ROM of RUE to [facility] to increase ability to complete dressing tasks with less difficulty and with independence.     Time  6    Period  Weeks      OT SHORT TERM GOAL #3   Title  Patient will increase RUE strength to 3+/5 to increase ability to complete waist level household tasks and be able to return to piano playing.     Time  6    Period  Weeks      OT SHORT TERM GOAL #4   Title  Patient will decrease fascial restrictions to min amount in RUE in order to increase functional mobility needed to for reaching tasks.     Time  6    Period  Weeks      OT SHORT TERM GOAL #5   Title  Patient will report a pain level score of 4/10 when using RUE during daily tasks.     Time  6    Period  Weeks        OT Long Term Goals - 08/08/17 1515  OT LONG TERM GOAL #1   Title  Patient will return to highest level of independence while using her RUE as her dominant extremity for all daily and work tasks.     Time  12    Period   Weeks    Status  On-going      OT LONG TERM GOAL #2   Title  Patient will increase A/ROM to WNL to increase ability to complete overhead reaching tasks with less difficulty.     Time  12    Period  Weeks      OT LONG TERM GOAL #3   Title  Patient will increase RUE strength to 4+/5 to be able to return to normal household lifting tasks.     Time  12    Period  Weeks    Status  On-going      OT LONG TERM GOAL #4   Title  Patient will decrease fascial restrictions to trace amount in RUE in order to increase functional mobility needed for overhead reaching.     Time  12    Period  Weeks    Status  On-going      OT LONG TERM GOAL #5   Title  Patient will report a decrease in pain level of 2/10 or less when completing daily tasks with her RUE.     Time  12    Period  Weeks    Status  On-going            Plan - 08/08/17 1510    Clinical Impression Statement  A: Trace fascial restrictions palpated in anterior shoulder and upper trapezius. Manual therapy completed to address. Increased repetitions supine and standing to 12. pt continues to have range limitations when using 1# weight while standing. Abuction and horizintal abduction difficulty.  VC for form and technique needed.     Plan  P: Complete strengthening sidelying. Use bodycraft system for internal and external rotation strethening.     Consulted and Agree with Plan of Care  Patient       Patient will benefit from skilled therapeutic intervention in order to improve the following deficits and impairments:  Decreased range of motion, Pain, Impaired UE functional use, Decreased strength, Increased fascial restrictions  Visit Diagnosis: Other symptoms and signs involving the musculoskeletal system  Stiffness of right shoulder, not elsewhere classified  Acute pain of right shoulder    Problem List Patient Active Problem List   Diagnosis Date Noted  . Vaginal Pap smear following hysterectomy for malignancy 03/02/2017   . Screening for colorectal cancer 03/02/2017  . Vaginal atrophy 03/02/2017  . History of ovarian cancer 03/02/2017  . Genetic testing 05/29/2015  . Family history of kidney cancer   . Family history of prostate cancer   . Chemotherapy-induced neuropathy ([place]) 03/31/2015  . Incisional hernia, incarcerated 10/15/2014  . Ventral hernia 07/15/2014  . Ovarian cancer on right ([place]) 06/17/2011  . Hypertension 06/17/2011  . H/O hysterectomy with oophorectomy 06/17/2011   Ailene Ravel, OTR/L,CBIS  [phone]  08/08/2017, 3:16 PM  [facility] [address] [place], [place], [address] Phone: [phone]   Fax:  [phone]  Name: Sarah Walker MRN: [id number] Date of Birth: [birth year]

## 2017-08-10 ENCOUNTER — Ambulatory Visit (HOSPITAL_COMMUNITY): Payer: Medicare Other

## 2017-08-10 DIAGNOSIS — M25511 Pain in right shoulder: Secondary | ICD-10-CM | POA: Diagnosis not present

## 2017-08-10 DIAGNOSIS — M25611 Stiffness of right shoulder, not elsewhere classified: Secondary | ICD-10-CM

## 2017-08-10 DIAGNOSIS — R29898 Other symptoms and signs involving the musculoskeletal system: Secondary | ICD-10-CM

## 2017-08-10 NOTE — Therapy (Signed)
[place], [place], [address] Phone: [phone]   Fax:  [phone]  Occupational Therapy Treatment  Patient Details  Name: Sarah Walker MRN: [id number] Date of Birth: 1950-12-26 Referring Provider: Fredonia Highland, MD   Encounter Date: 08/10/2017  OT End of Session - 08/10/17 1511    Visit Number  20    Number of Visits  24    Date for OT Re-Evaluation  08/31/17    Authorization Type  1) [employer] medicare $40 copay 2) [employer] no authorization needed.     OT Start Time  1437    OT Stop Time  1515    OT Time Calculation (min)  38 min    Activity Tolerance  Patient tolerated treatment well    Behavior During Therapy  WFL for tasks assessed/performed       Past Medical History:  Diagnosis Date  . Abdominal hernia   . Allergic rhinitis   . Anemia associated with chemotherapy 09/16/2011  . Bone spur of other site    left foot  . Dry eyes   . Family history of kidney cancer   . Family history of prostate cancer   . Hypertension   . Ovarian cancer ([place])   . Ovarian cancer on right ([place]) 06/17/2011  . Peripheral neuropathy 08/26/2011   Chemotherapy-induced  . Pulmonary embolism ([place])   . Sinusitis     Past Surgical History:  Procedure Laterality Date  . ABDOMINAL HYSTERECTOMY  05/25/2011   [place] bs&o and cancer staging  . CHOLECYSTECTOMY  1990  . COLONOSCOPY N/A 09/06/2014   Procedure: COLONOSCOPY;  Surgeon: Rogene Houston, MD;  [facility];  Service: Endoscopy;  Laterality: N/A;  930 - moved to 9/2 @ 8:25  . INCISIONAL HERNIA REPAIR  10/15/2014  . INCISIONAL HERNIA REPAIR  10/15/2014   Procedure: OPEN INCISIONAL HERNIA REPAIR WITH MESH AND MYOFASCIAL FLAPS;  Surgeon: Erroll Luna, MD;  Location: [facility];  Service: General;;  . IVC filter  05/24/11  . PORT-A-CATH REMOVAL Right 11/21/2013   Procedure: MINOR REMOVAL PORT-A-CATH;  Surgeon: Jamesetta So, MD;  Location: [facility];  Service: General;  Laterality:  Right;  . PORTACATH PLACEMENT  06/16/11   Done at [facility]  . TONSILLECTOMY     age 24's  . TUBAL LIGATION  1981    There were no vitals filed for this visit.  Subjective Assessment - 08/10/17 1512    Subjective   S: I've been working on it a lot.     Currently in Pain?  No/denies         [facility] OT Assessment - 08/10/17 1501      Assessment   Medical Diagnosis  Right RTC repair       Precautions   Precautions  Shoulder    Type of Shoulder Precautions  Progress as tolerated               OT Treatments/Exercises (OP) - 08/10/17 1501      Exercises   Exercises  Shoulder      Shoulder Exercises: Supine   Protraction  PROM;5 reps    Horizontal ABduction  PROM;5 reps    External Rotation  PROM;5 reps    Internal Rotation  PROM;5 reps    Flexion  PROM;5 reps    ABduction  PROM;5 reps      Shoulder Exercises: Prone   Flexion  Strengthening;10 reps    Flexion Weight (lbs)  1    Horizontal ABduction 1  Strengthening;10 reps    Horizontal ABduction 1 Weight (lbs)  1    Horizontal ABduction 2  Strengthening;10 reps    Horizontal ABduction 2 Weight (lbs)  1      Shoulder Exercises: Sidelying   External Rotation  Strengthening;15 reps    External Rotation Weight (lbs)  1    Internal Rotation  Strengthening;15 reps    Internal Rotation Weight (lbs)  1    Flexion  Strengthening;15 reps    Flexion Weight (lbs)  1    ABduction  Strengthening;15 reps    ABduction Weight (lbs)  1    Other Sidelying Exercises  Horizontal Abduction; 1#; 15X      Shoulder Exercises: ROM/Strengthening   UBE (Upper Arm Bike)  Level 2 reverse 3' pace: 4/0      Manual Therapy   Manual Therapy  Myofascial release    Manual therapy comments  manual therapy completed seperately from all other interventions this date    Myofascial Release  myofascial release and manual stretching to right upper arm, scapular, and shoulder region to decrease pain and restrictions and imrpove pain free  mobility needed for resumption of right arm as dominant               OT Short Term Goals - 08/08/17 1450      OT SHORT TERM GOAL #1   Title  Patient will be educated and independent with HEP to faciliate progress in therapy and allow her to begin using her RUE for her daily tasks.     Time  6    Period  Weeks      OT SHORT TERM GOAL #2   Title  Patient will increase P/ROM of RUE to [facility] to increase ability to complete dressing tasks with less difficulty and with independence.     Time  6    Period  Weeks      OT SHORT TERM GOAL #3   Title  Patient will increase RUE strength to 3+/5 to increase ability to complete waist level household tasks and be able to return to piano playing.     Time  6    Period  Weeks      OT SHORT TERM GOAL #4   Title  Patient will decrease fascial restrictions to min amount in RUE in order to increase functional mobility needed to for reaching tasks.     Time  6    Period  Weeks      OT SHORT TERM GOAL #5   Title  Patient will report a pain level score of 4/10 when using RUE during daily tasks.     Time  6    Period  Weeks        OT Long Term Goals - 08/08/17 1515      OT LONG TERM GOAL #1   Title  Patient will return to highest level of independence while using her RUE as her dominant extremity for all daily and work tasks.     Time  12    Period  Weeks    Status  On-going      OT LONG TERM GOAL #2   Title  Patient will increase A/ROM to WNL to increase ability to complete overhead reaching tasks with less difficulty.     Time  12    Period  Weeks      OT LONG TERM GOAL #3   Title  Patient will increase RUE strength to 4+/5 to be able to return to normal household lifting tasks.     Time  12    Period  Weeks    Status  On-going      OT LONG TERM GOAL #4   Title  Patient will decrease fascial restrictions to trace amount in RUE in order to increase functional mobility needed for overhead reaching.     Time  12    Period  Weeks     Status  On-going      OT LONG TERM GOAL #5   Title  Patient will report a decrease in pain level of 2/10 or less when completing daily tasks with her RUE.     Time  12    Period  Weeks    Status  On-going            Plan - 08/10/17 1512    Clinical Impression Statement  A: Trace fascial restricitons palpated in right upper arm, trapezius, and scapularis region. Session focused on activating scapular muscles for getting functional performance during functional reaching tasks. Patient required max verbal and physical cueing at times for proper form and technique.     Plan  P: Continue to work on strengthening and scapular stability while working on proper form and technique. Complete functional reaching with wrist weight. Scapular theraband exercises as well as bodycraft for IR/er strengthening.  D/C manual therapy and passive stretching.        Patient will benefit from skilled therapeutic intervention in order to improve the following deficits and impairments:  Decreased range of motion, Pain, Impaired UE functional use, Decreased strength, Increased fascial restrictions  Visit Diagnosis: Other symptoms and signs involving the musculoskeletal system  Stiffness of right shoulder, not elsewhere classified  Acute pain of right shoulder    Problem List Patient Active Problem List   Diagnosis Date Noted  . Vaginal Pap smear following hysterectomy for malignancy 03/02/2017  . Screening for colorectal cancer 03/02/2017  . Vaginal atrophy 03/02/2017  . History of ovarian cancer 03/02/2017  . Genetic testing 05/29/2015  . Family history of kidney cancer   . Family history of prostate cancer   . Chemotherapy-induced neuropathy ([place]) 03/31/2015  . Incisional hernia, incarcerated 10/15/2014  . Ventral hernia 07/15/2014  . Ovarian cancer on right ([place]) 06/17/2011  . Hypertension 06/17/2011  . H/O hysterectomy with oophorectomy 06/17/2011   Ailene Ravel, OTR/L,CBIS   [phone]  08/10/2017, 3:17 PM  [facility] [address] [place], [place], [address] Phone: [phone]   Fax:  [phone]  Name: Sarah Walker MRN: [id number] Date of Birth: 10-22-1950

## 2017-08-12 DIAGNOSIS — M19011 Primary osteoarthritis, right shoulder: Secondary | ICD-10-CM | POA: Diagnosis not present

## 2017-08-15 ENCOUNTER — Encounter (HOSPITAL_COMMUNITY): Payer: Self-pay | Admitting: Occupational Therapy

## 2017-08-15 ENCOUNTER — Ambulatory Visit (HOSPITAL_COMMUNITY): Payer: Medicare Other | Admitting: Occupational Therapy

## 2017-08-15 DIAGNOSIS — M25611 Stiffness of right shoulder, not elsewhere classified: Secondary | ICD-10-CM

## 2017-08-15 DIAGNOSIS — R29898 Other symptoms and signs involving the musculoskeletal system: Secondary | ICD-10-CM

## 2017-08-15 DIAGNOSIS — M25511 Pain in right shoulder: Secondary | ICD-10-CM

## 2017-08-15 NOTE — Therapy (Signed)
[place], [place], [address] Phone: [phone]   Fax:  [phone]  Occupational Therapy Reassessment, Treatment, and Discharge Summary  Patient Details  Name: Sarah Walker MRN: [id number] Date of Birth: Jul 10, 1950 Referring Provider: Fredonia Highland, MD   Progress Note Reporting Period 8/1 to 08/15/2017  See note below for Objective Data and Assessment of Progress/Goals.       Encounter Date: 08/15/2017  OT End of Session - 08/15/17 1502    Visit Number  21    Number of Visits  24    Date for OT Re-Evaluation  08/31/17    Authorization Type  1) [employer] medicare $40 copay 2) [employer] no authorization needed.     OT Start Time  1430    OT Stop Time  1500    OT Time Calculation (min)  30 min    Activity Tolerance  Patient tolerated treatment well    Behavior During Therapy  WFL for tasks assessed/performed       Past Medical History:  Diagnosis Date  . Abdominal hernia   . Allergic rhinitis   . Anemia associated with chemotherapy 09/16/2011  . Bone spur of other site    left foot  . Dry eyes   . Family history of kidney cancer   . Family history of prostate cancer   . Hypertension   . Ovarian cancer ([place])   . Ovarian cancer on right ([place]) 06/17/2011  . Peripheral neuropathy 08/26/2011   Chemotherapy-induced  . Pulmonary embolism ([place])   . Sinusitis     Past Surgical History:  Procedure Laterality Date  . ABDOMINAL HYSTERECTOMY  05/25/2011   [place] bs&o and cancer staging  . CHOLECYSTECTOMY  1990  . COLONOSCOPY N/A 09/06/2014   Procedure: COLONOSCOPY;  Surgeon: Rogene Houston, MD;  [facility];  Service: Endoscopy;  Laterality: N/A;  930 - moved to 9/2 @ 8:25  . INCISIONAL HERNIA REPAIR  10/15/2014  . INCISIONAL HERNIA REPAIR  10/15/2014   Procedure: OPEN INCISIONAL HERNIA REPAIR WITH MESH AND MYOFASCIAL FLAPS;  Surgeon: Erroll Luna, MD;  Location: [facility];  Service: General;;  . IVC filter   05/24/11  . PORT-A-CATH REMOVAL Right 11/21/2013   Procedure: MINOR REMOVAL PORT-A-CATH;  Surgeon: Jamesetta So, MD;  Location: [facility];  Service: General;  Laterality: Right;  . PORTACATH PLACEMENT  06/16/11   Done at [facility]  . TONSILLECTOMY     age 67's  . TUBAL LIGATION  1981    There were no vitals filed for this visit.  Subjective Assessment - 08/15/17 1442    Subjective   S: The doctor let me go.     Currently in Pain?  No/denies         [facility] OT Assessment - 08/15/17 1432      Assessment   Medical Diagnosis  Right RTC repair       Precautions   Precautions  Shoulder    Type of Shoulder Precautions  Progress as tolerated      Observation/Other Assessments   Focus on Therapeutic Outcomes (FOTO)   62/100   61/100 previous     Palpation   Palpation comment  Minimal fascial restrictions in anterior forearm      AROM   Overall AROM Comments  assessed seated; IR/er adducted    AROM Assessment Site  Shoulder    Right/Left Shoulder  Right    Right Shoulder Flexion  168 Degrees  167 previous   Right Shoulder ABduction  180 Degrees   same as previous   Right Shoulder Internal Rotation  90 Degrees   same as previous   Right Shoulder External Rotation  90 Degrees   same as previous     PROM   Overall PROM Comments  assessed supine; IR/er adducted    PROM Assessment Site  Shoulder    Right/Left Shoulder  Right    Right Shoulder Flexion  170 Degrees   same as previous   Right Shoulder ABduction  180 Degrees   same as previous   Right Shoulder Internal Rotation  90 Degrees   same as previous   Right Shoulder External Rotation  85 Degrees   same as previous     Strength   Overall Strength Comments  assessed seated; IR/er adducted    Strength Assessment Site  Shoulder    Right/Left Shoulder  Right    Right Shoulder Flexion  4-/5   3+/5 previous   Right Shoulder ABduction  4-/5   3+/5 previous   Right Shoulder Internal Rotation  4+/5   4/5 previous    Right Shoulder External Rotation  4/5   4+/5 previous              OT Treatments/Exercises (OP) - 08/15/17 1442      Exercises   Exercises  Shoulder      Shoulder Exercises: Supine   Protraction  Strengthening;12 reps    Protraction Weight (lbs)  1    Horizontal ABduction  Strengthening;12 reps    Horizontal ABduction Weight (lbs)  1    External Rotation  Right;12 reps    External Rotation Weight (lbs)  1    Internal Rotation  Strengthening;12 reps    Internal Rotation Weight (lbs)  1    Flexion  Strengthening;12 reps    Shoulder Flexion Weight (lbs)  1    ABduction  Strengthening;12 reps    Shoulder ABduction Weight (lbs)  1      Shoulder Exercises: Standing   Protraction  Theraband;10 reps    Horizontal ABduction  Theraband;10 reps    External Rotation  Theraband;10 reps    Internal Rotation  Theraband;10 reps    Flexion  Theraband;10 reps    ABduction  Theraband;10 reps             OT Education - 08/15/17 1445    Education Details  red theraband strengthening    Person(s) Educated  Patient    Methods  Explanation;Demonstration;Handout    Comprehension  Verbalized understanding;Returned demonstration       OT Short Term Goals - 08/08/17 1450      OT SHORT TERM GOAL #1   Title  Patient will be educated and independent with HEP to faciliate progress in therapy and allow her to begin using her RUE for her daily tasks.     Time  6    Period  Weeks      OT SHORT TERM GOAL #2   Title  Patient will increase P/ROM of RUE to [facility] to increase ability to complete dressing tasks with less difficulty and with independence.     Time  6    Period  Weeks      OT SHORT TERM GOAL #3   Title  Patient will increase RUE strength to 3+/5 to increase ability to complete waist level household tasks and be able to return to piano playing.     Time  6  Period  Weeks      OT SHORT TERM GOAL #4   Title  Patient will decrease fascial restrictions to min amount in  RUE in order to increase functional mobility needed to for reaching tasks.     Time  6    Period  Weeks      OT SHORT TERM GOAL #5   Title  Patient will report a pain level score of 4/10 when using RUE during daily tasks.     Time  6    Period  Weeks        OT Long Term Goals - 08/15/17 1502      OT LONG TERM GOAL #1   Title  Patient will return to highest level of independence while using her RUE as her dominant extremity for all daily and work tasks.     Time  12    Period  Weeks    Status  Achieved      OT LONG TERM GOAL #2   Title  Patient will increase A/ROM to WNL to increase ability to complete overhead reaching tasks with less difficulty.     Time  12    Period  Weeks      OT LONG TERM GOAL #3   Title  Patient will increase RUE strength to 4+/5 to be able to return to normal household lifting tasks.     Time  12    Period  Weeks    Status  Partially Met      OT LONG TERM GOAL #4   Title  Patient will decrease fascial restrictions to trace amount in RUE in order to increase functional mobility needed for overhead reaching.     Time  12    Period  Weeks    Status  Achieved      OT LONG TERM GOAL #5   Title  Patient will report a decrease in pain level of 2/10 or less when completing daily tasks with her RUE.     Time  12    Period  Weeks    Status  Achieved            Plan - 08/15/17 1502    Clinical Impression Statement  A: Reassessment completed today per pt request, she reports MD has released her. Pt has met all STGs and 4/5 LTGs, and has partially met remaining LTG. Pt is completing all ADLs without difficulty and is ready for discharge with HEP. Pt educated on theraband strengthening today, reviewed current HEP and discussed strengthening with weights as well. Verbal cuing initially for form during new exercises.     Plan  P: Discharge pt       Patient will benefit from skilled therapeutic intervention in order to improve the following deficits and  impairments:  Decreased range of motion, Pain, Impaired UE functional use, Decreased strength, Increased fascial restrictions  Visit Diagnosis: Other symptoms and signs involving the musculoskeletal system  Stiffness of right shoulder, not elsewhere classified  Acute pain of right shoulder    Problem List Patient Active Problem List   Diagnosis Date Noted  . Vaginal Pap smear following hysterectomy for malignancy 03/02/2017  . Screening for colorectal cancer 03/02/2017  . Vaginal atrophy 03/02/2017  . History of ovarian cancer 03/02/2017  . Genetic testing 05/29/2015  . Family history of kidney cancer   . Family history of prostate cancer   . Chemotherapy-induced neuropathy ([place]) 03/31/2015  . Incisional hernia, incarcerated 10/15/2014  .  Ventral hernia 07/15/2014  . Ovarian cancer on right ([place]) 06/17/2011  . Hypertension 06/17/2011  . H/O hysterectomy with oophorectomy 06/17/2011   Guadelupe Sabin, OTR/L  [phone] 08/15/2017, 3:05 PM  [facility] [address] [place], [place], [address] Phone: [phone]   Fax:  [phone]  Name: LEIYAH MAULTSBY MRN: [id number] Date of Birth: 1950/03/15    OCCUPATIONAL THERAPY DISCHARGE SUMMARY  Visits from Start of Care: 21  Current functional level related to goals / functional outcomes: See above. Pt is at highest level of functioning using RUE for all ADL tasks including lifting overhead and performing household chores. Pt's BUE strength is equivalent today during MMT.    Remaining deficits: Decreased activity tolerance   Education / Equipment: HEP for continued RUE strengthening Plan: Patient agrees to discharge.  Patient goals were met. Patient is being discharged due to meeting the stated rehab goals.  ?????

## 2017-08-15 NOTE — Patient Instructions (Signed)
Theraband strengthening: Complete 10-15X, 1-2X/day  1) Shoulder protraction  Anchor band in doorway, stand with back to door. Push your hand forward as much as you can to bringing your shoulder blades forward on your rib cage.     2) Shoulder flexion  While standing with back to the door, holding Theraband at hand level, raise arm in front of you.  Keep elbow straight through entire movement.      3) Shoulder horizontal abduction  Standing with a theraband anchored at chest height, begin with arm straight and some tension in the band. Move your arm out to your side (keeping straight the whole time). Bring the affected arm back to midline.     4) Shoulder Internal Rotation  While holding an elastic band at your side with your elbow bent, start with your hand away from your stomach, then pull the band towards your stomach. Keep your elbow near your side the entire time.     5) Shoulder External Rotation  While holding an elastic band at your side with your elbow bent, start with your hand near your stomach and then pull the band away. Keep your elbow at your side the entire time.     6) Shoulder abduction  While holding an elastic band at your side, draw up your arm to the side keeping your elbow straight.   

## 2017-08-17 ENCOUNTER — Encounter (HOSPITAL_COMMUNITY): Payer: Medicare Other

## 2017-08-26 DIAGNOSIS — S8011XA Contusion of right lower leg, initial encounter: Secondary | ICD-10-CM | POA: Diagnosis not present

## 2017-08-26 DIAGNOSIS — Z1389 Encounter for screening for other disorder: Secondary | ICD-10-CM | POA: Diagnosis not present

## 2017-10-07 ENCOUNTER — Other Ambulatory Visit (HOSPITAL_COMMUNITY): Payer: Self-pay | Admitting: Family Medicine

## 2017-10-07 DIAGNOSIS — Z1231 Encounter for screening mammogram for malignant neoplasm of breast: Secondary | ICD-10-CM

## 2017-10-14 ENCOUNTER — Ambulatory Visit (HOSPITAL_COMMUNITY)
Admission: RE | Admit: 2017-10-14 | Discharge: 2017-10-14 | Disposition: A | Discharge status: Home / Self Care | Payer: Medicare Other | Source: Ambulatory Visit | Attending: Family Medicine | Admitting: Family Medicine

## 2017-10-14 DIAGNOSIS — Z1231 Encounter for screening mammogram for malignant neoplasm of breast: Secondary | ICD-10-CM | POA: Diagnosis not present

## 2017-10-14 DIAGNOSIS — E782 Mixed hyperlipidemia: Secondary | ICD-10-CM | POA: Diagnosis not present

## 2017-10-14 DIAGNOSIS — I10 Essential (primary) hypertension: Secondary | ICD-10-CM | POA: Diagnosis not present

## 2017-10-14 DIAGNOSIS — M1990 Unspecified osteoarthritis, unspecified site: Secondary | ICD-10-CM | POA: Diagnosis not present

## 2017-10-14 DIAGNOSIS — H1045 Other chronic allergic conjunctivitis: Secondary | ICD-10-CM | POA: Diagnosis not present

## 2017-10-14 DIAGNOSIS — T451X5A Adverse effect of antineoplastic and immunosuppressive drugs, initial encounter: Secondary | ICD-10-CM | POA: Diagnosis not present

## 2017-10-14 DIAGNOSIS — Z0001 Encounter for general adult medical examination with abnormal findings: Secondary | ICD-10-CM | POA: Diagnosis not present

## 2017-10-14 DIAGNOSIS — Z1389 Encounter for screening for other disorder: Secondary | ICD-10-CM | POA: Diagnosis not present

## 2017-10-21 DIAGNOSIS — Z0001 Encounter for general adult medical examination with abnormal findings: Secondary | ICD-10-CM | POA: Diagnosis not present

## 2017-10-21 DIAGNOSIS — Z1389 Encounter for screening for other disorder: Secondary | ICD-10-CM | POA: Diagnosis not present

## 2017-10-21 DIAGNOSIS — T451X5A Adverse effect of antineoplastic and immunosuppressive drugs, initial encounter: Secondary | ICD-10-CM | POA: Diagnosis not present

## 2017-10-21 DIAGNOSIS — E875 Hyperkalemia: Secondary | ICD-10-CM | POA: Diagnosis not present

## 2017-11-03 ENCOUNTER — Other Ambulatory Visit (HOSPITAL_COMMUNITY): Payer: Self-pay | Admitting: *Deleted

## 2017-11-03 DIAGNOSIS — C569 Malignant neoplasm of unspecified ovary: Secondary | ICD-10-CM

## 2017-11-04 ENCOUNTER — Inpatient Hospital Stay (HOSPITAL_COMMUNITY): Payer: Medicare Other | Attending: Hematology

## 2017-11-04 DIAGNOSIS — Z79899 Other long term (current) drug therapy: Secondary | ICD-10-CM | POA: Insufficient documentation

## 2017-11-04 DIAGNOSIS — Z7982 Long term (current) use of aspirin: Secondary | ICD-10-CM | POA: Diagnosis not present

## 2017-11-04 DIAGNOSIS — I10 Essential (primary) hypertension: Secondary | ICD-10-CM | POA: Insufficient documentation

## 2017-11-04 DIAGNOSIS — T451X5D Adverse effect of antineoplastic and immunosuppressive drugs, subsequent encounter: Secondary | ICD-10-CM | POA: Diagnosis not present

## 2017-11-04 DIAGNOSIS — Z8543 Personal history of malignant neoplasm of ovary: Secondary | ICD-10-CM | POA: Insufficient documentation

## 2017-11-04 DIAGNOSIS — G620 Drug-induced polyneuropathy: Secondary | ICD-10-CM | POA: Insufficient documentation

## 2017-11-04 DIAGNOSIS — C569 Malignant neoplasm of unspecified ovary: Secondary | ICD-10-CM

## 2017-11-04 LAB — CBC WITH DIFFERENTIAL/PLATELET
Abs Immature Granulocytes: 0.01 10*3/uL (ref 0.00–0.07)
BASOS PCT: 1 %
Basophils Absolute: 0 10*3/uL (ref 0.0–0.1)
Eosinophils Absolute: 0.2 10*3/uL (ref 0.0–0.5)
Eosinophils Relative: 5 %
HCT: 41.6 % (ref 36.0–46.0)
Hemoglobin: 13 g/dL (ref 12.0–15.0)
Immature Granulocytes: 0 %
Lymphocytes Relative: 38 %
Lymphs Abs: 1.7 10*3/uL (ref 0.7–4.0)
MCH: 31.3 pg (ref 26.0–34.0)
MCHC: 31.3 g/dL (ref 30.0–36.0)
MCV: 100 fL (ref 80.0–100.0)
MONOS PCT: 9 %
Monocytes Absolute: 0.4 10*3/uL (ref 0.1–1.0)
Neutro Abs: 2.1 10*3/uL (ref 1.7–7.7)
Neutrophils Relative %: 47 %
PLATELETS: 176 10*3/uL (ref 150–400)
RBC: 4.16 MIL/uL (ref 3.87–5.11)
RDW: 13.3 % (ref 11.5–15.5)
WBC: 4.4 10*3/uL (ref 4.0–10.5)
nRBC: 0 % (ref 0.0–0.2)

## 2017-11-04 LAB — COMPREHENSIVE METABOLIC PANEL
ALT: 19 U/L (ref 0–44)
AST: 17 U/L (ref 15–41)
Albumin: 3.8 g/dL (ref 3.5–5.0)
Alkaline Phosphatase: 58 U/L (ref 38–126)
Anion gap: 8 (ref 5–15)
BUN: 25 mg/dL — ABNORMAL HIGH (ref 8–23)
CALCIUM: 9 mg/dL (ref 8.9–10.3)
CHLORIDE: 107 mmol/L (ref 98–111)
CO2: 26 mmol/L (ref 22–32)
Creatinine, Ser: 0.81 mg/dL (ref 0.44–1.00)
GFR calc Af Amer: >60 mL/min (ref >60)
GLUCOSE: 104 mg/dL — AB (ref 70–99)
Potassium: 4.2 mmol/L (ref 3.5–5.1)
SODIUM: 141 mmol/L (ref 135–145)
TOTAL PROTEIN: 6.9 g/dL (ref 6.5–8.1)
Total Bilirubin: 0.7 mg/dL (ref 0.3–1.2)

## 2017-11-05 LAB — CA 125: CANCER ANTIGEN (CA) 125: 14.7 U/mL (ref 0.0–38.1)

## 2017-11-11 ENCOUNTER — Other Ambulatory Visit: Payer: Self-pay

## 2017-11-11 ENCOUNTER — Encounter (HOSPITAL_COMMUNITY): Payer: Self-pay | Admitting: Hematology

## 2017-11-11 ENCOUNTER — Inpatient Hospital Stay (HOSPITAL_BASED_OUTPATIENT_CLINIC_OR_DEPARTMENT_OTHER): Payer: Medicare Other | Admitting: Hematology

## 2017-11-11 VITALS — BP 141/80 | HR 53 | Temp 98.3°F | Resp 16 | Wt 225.0 lb

## 2017-11-11 DIAGNOSIS — G629 Polyneuropathy, unspecified: Secondary | ICD-10-CM

## 2017-11-11 DIAGNOSIS — Z7982 Long term (current) use of aspirin: Secondary | ICD-10-CM

## 2017-11-11 DIAGNOSIS — G620 Drug-induced polyneuropathy: Secondary | ICD-10-CM | POA: Diagnosis not present

## 2017-11-11 DIAGNOSIS — I10 Essential (primary) hypertension: Secondary | ICD-10-CM | POA: Diagnosis not present

## 2017-11-11 DIAGNOSIS — C569 Malignant neoplasm of unspecified ovary: Secondary | ICD-10-CM

## 2017-11-11 DIAGNOSIS — Z79899 Other long term (current) drug therapy: Secondary | ICD-10-CM | POA: Diagnosis not present

## 2017-11-11 DIAGNOSIS — Z8543 Personal history of malignant neoplasm of ovary: Secondary | ICD-10-CM | POA: Diagnosis not present

## 2017-11-11 DIAGNOSIS — T451X5D Adverse effect of antineoplastic and immunosuppressive drugs, subsequent encounter: Secondary | ICD-10-CM | POA: Diagnosis not present

## 2017-11-11 DIAGNOSIS — C561 Malignant neoplasm of right ovary: Secondary | ICD-10-CM

## 2017-11-11 MED ORDER — PREGABALIN 25 MG PO CAPS: 25.0000 mg | ORAL_CAPSULE | Freq: Two times a day (BID) | ORAL | 2 refills | Status: DC

## 2017-11-11 NOTE — Patient Instructions (Signed)
[facility] Discharge Instructions     Thank you for choosing [facility] at [facility] to provide your oncology and hematology care.  To afford each patient quality time with our provider, please arrive at least 15 minutes before your scheduled appointment time.   If you have a lab appointment with the Cancer Center please come in thru the  Main Entrance and check in at the main information desk  You need to re-schedule your appointment should you arrive 10 or more minutes late.  We strive to give you quality time with our providers, and arriving late affects you and other patients whose appointments are after yours.  Also, if you no show three or more times for appointments you may be dismissed from the clinic at the providers discretion.     Again, thank you for choosing [facility].  Our hope is that these requests will decrease the amount of time that you wait before being seen by our physicians.       _____________________________________________________________  Should you have questions after your visit to [facility], please contact our office at [phone] between the hours of 8:00 a.m. and 4:30 p.m.  Voicemails left after 4:00 p.m. will not be returned until the following business day.  For prescription refill requests, have your pharmacy contact our office and allow 72 hours.    Cancer Center Support Programs:   > Cancer Support Group  2nd Tuesday of the month 1pm-2pm, Journey Room    

## 2017-11-11 NOTE — Assessment & Plan Note (Signed)
1.  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 - 6 cycles of carboplatin and paclitaxel from 07/15/2011 through 11/04/2011. - Denies any abdominal bloating or pains.  Physical examination was within normal limits. - She will come back in 1 year for follow-up.  I have reviewed her labs today.  CA 125 is within normal limits.  2. peripheral neuropathy: -She has chemotherapy-induced peripheral neuropathy with numbness in the feet. -She has tried gabapentin in the past but could not tolerate it.  She gets pains in her feet when she stands for long time or walks for long time. -We will start her on Lyrica 25 twice daily and titrated up as needed.  When she has to stand for a long, she takes 1 tablet of hydrocodone as needed.  3.  Health maintenance: -Mammogram on 10/14/2017 was BI-RADS Category 1.

## 2017-11-11 NOTE — Progress Notes (Signed)
[place], [place] [address]   CLINIC:  Medical Oncology/Hematology  PCP:  [name], [place] [address] [phone]   REASON FOR VISIT: Follow-up for Ovarian cancer  CURRENT THERAPY: Observation  BRIEF ONCOLOGIC HISTORY:    Ovarian cancer on right [facility])   05/25/2011 Surgery    Debulking surgery at [facility] in [facility]    05/25/2011 Initial Diagnosis    Ovarian cancer    07/15/2011 - 11/04/2011 Chemotherapy    Carboplatin/Pacliatexel x 6 cycles    12/01/2011 Remission    CT scan negative for disease    05/15/2012 Imaging    CT abd/pelvis- Negative for mass or adenopathy.      CANCER STAGING: Cancer Staging Ovarian cancer on right [facility]) Staging form: Ovary, AJCC 7th Edition - Clinical: Stage Ia - Signed by Baird Cancer, PA on 07/26/2011    INTERVAL HISTORY:  Ms. Fahs 67 y.o. female returns for routine follow-up for ovarian cancer. She has problems with neuropathy in her feet. She is unable to walk long distances due to the pain. She does take any medications for this due to intolerance. She denies any new pains. Denies any nausea, vomiting, or diarrhea. Denies any bleeding or dark stools. She reports her appetite at 100% and her energy level at 100%. She lives at home and performs all her own ADLs and activities.     REVIEW OF SYSTEMS:  Review of Systems  Neurological: Positive for numbness (feet).  All other systems reviewed and are negative.    PAST MEDICAL/SURGICAL HISTORY:  Past Medical History:  Diagnosis Date  . Abdominal hernia   . Allergic rhinitis   . Anemia associated with chemotherapy 09/16/2011  . Bone spur of other site    left foot  . Dry eyes   . Family history of kidney cancer   . Family history of prostate cancer   . Hypertension   . Ovarian cancer ([place])   . Ovarian cancer on right ([place]) 06/17/2011  . Peripheral neuropathy 08/26/2011   Chemotherapy-induced  . Pulmonary embolism ([place])   . Sinusitis    Past Surgical History:  Procedure Laterality Date  . ABDOMINAL HYSTERECTOMY  05/25/2011   [place] bs&o and cancer staging  . CHOLECYSTECTOMY  1990  . COLONOSCOPY N/A 09/06/2014   Procedure: COLONOSCOPY;  Surgeon: Rogene Houston, MD;  [facility];  Service: Endoscopy;  Laterality: N/A;  930 - moved to 9/2 @ 8:25  . INCISIONAL HERNIA REPAIR  10/15/2014  . INCISIONAL HERNIA REPAIR  10/15/2014   Procedure: OPEN INCISIONAL HERNIA REPAIR WITH MESH AND MYOFASCIAL FLAPS;  Surgeon: Erroll Luna, MD;  Location: [place];  Service: General;;  . IVC filter  05/24/11  . PORT-A-CATH REMOVAL Right 11/21/2013   Procedure: MINOR REMOVAL PORT-A-CATH;  Surgeon: Jamesetta So, MD;  Location: [facility];  Service: General;  Laterality: Right;  . PORTACATH PLACEMENT  06/16/11   Done at [facility]  . TONSILLECTOMY     age 63's  . TUBAL LIGATION  1981     SOCIAL HISTORY:  Social History   Socioeconomic History  . Marital status: Married    Spouse name: Not on file  . Number of children: Not on file  . Years of education: Not on file  . Highest education level: Not on file  Occupational History  . Not on file  Social Needs  . Financial resource strain: Not on file  . Food  insecurity:    Worry: Not on file    Inability: Not on file  . Transportation needs:    Medical: Not on file    Non-medical: Not on file  Tobacco Use  . Smoking status: Never Smoker  . Smokeless tobacco: Never Used  Substance and Sexual Activity  . Alcohol use: No  . Drug use: No  . Sexual activity: Yes    Birth control/protection: Surgical    Comment: hyst  Lifestyle  . Physical activity:    Days per week: Not on file    Minutes per session: Not on file  . Stress: Not on file  Relationships  . Social connections:    Talks on phone: Not on file    Gets together: Not on file    Attends religious service: Not on file    Active member of club  or organization: Not on file    Attends meetings of clubs or organizations: Not on file    Relationship status: Not on file  . Intimate partner violence:    Fear of current or ex partner: Not on file    Emotionally abused: Not on file    Physically abused: Not on file    Forced sexual activity: Not on file  Other Topics Concern  . Not on file  Social History Narrative  . Not on file    FAMILY HISTORY:  Family History  Problem Relation Age of Onset  . Stroke Mother   . Cervical cancer Mother 75  . Kidney cancer Father 15  . Aneurysm Paternal Grandmother        46s  . Hodgkin's lymphoma Maternal Uncle 33  . Prostate cancer Paternal Uncle   . Cancer Cousin        2 female maternal first cousins with unknown cancer  . Throat cancer Paternal Uncle     CURRENT MEDICATIONS:  Outpatient Encounter Medications as of 11/11/2017  Medication Sig Note  . aspirin EC 81 MG tablet Take 81 mg by mouth daily.   Marland Kitchen azelastine (OPTIVAR) 0.05 % ophthalmic solution USE 1 DROP TWICE DAILY   . bisoprolol-hydrochlorothiazide (ZIAC) 5-6.25 MG per tablet Take 1 tablet by mouth daily.   . Calcium-Magnesium-Vitamin D (CALCIUM 500 PO) Take 2,000 mg by mouth.   . DHA-EPA-Vitamin E (OMEGA-3 COMPLEX PO) Take by mouth daily.   . diphenhydrAMINE (BENADRYL) 25 MG tablet Take 25 mg by mouth every 6 (six) hours as needed for itching.   . fexofenadine (ALLEGRA) 180 MG tablet Take 180 mg by mouth daily as needed for allergies or rhinitis.   . fluticasone (FLONASE) 50 MCG/ACT nasal spray Place 1 spray into both nostrils daily as needed for allergies or rhinitis.   Marland Kitchen KLOR-CON M20 20 MEQ tablet TAKE 1 TABLET BY MOUTH EVERY DAY   . meloxicam (MOBIC) 15 MG tablet Take 15 mg by mouth daily. 08/21/2014: On hold  . valsartan (DIOVAN) 160 MG tablet Take 160 mg by mouth daily.   . pregabalin (LYRICA) 25 MG capsule Take 1 capsule (25 mg total) by mouth 2 (two) times daily.   . [DISCONTINUED] Estradiol 10 MCG TABS vaginal tablet  Use 1 in vagina daily for 2 weeks and then 2 x a week   . [DISCONTINUED] omeprazole (PRILOSEC) 20 MG capsule Take 20 mg by mouth daily.   . [DISCONTINUED] valsartan (DIOVAN) 80 MG tablet Take 80 mg by mouth daily.    Facility-Administered Encounter Medications as of 11/11/2017  Medication  . sodium chloride 0.9 %  injection 10 mL  . sodium chloride 0.9 % injection 10 mL    ALLERGIES:  Allergies  Allergen Reactions  . Codeine Nausea And Vomiting     PHYSICAL EXAM:  ECOG Performance status: 1  Vitals:   11/11/17 1216  BP: (!) 141/80  Pulse: (!) 53  Resp: 16  Temp: 98.3 F (36.8 C)  SpO2: 99%   Filed Weights   11/11/17 1216  Weight: 225 lb (102.1 kg)    Physical Exam  Constitutional: She is oriented to person, place, and time. She appears well-developed and well-nourished.  Abdominal: Soft.  Musculoskeletal: Normal range of motion.  Neurological: She is alert and oriented to person, place, and time.  Skin: Skin is warm and dry.  Psychiatric: She has a normal mood and affect. Her behavior is normal. Judgment and thought content normal.  Abdomen: Soft nontender with no palpable organomegaly.  No palpable masses. Extremities: No edema or cyanosis.   LABORATORY DATA:  I have reviewed the labs as listed.  CBC    Component Value Date/Time   WBC 4.4 11/04/2017 0917   RBC 4.16 11/04/2017 0917   HGB 13.0 11/04/2017 0917   HCT 41.6 11/04/2017 0917   PLT 176 11/04/2017 0917   MCV 100.0 11/04/2017 0917   MCH 31.3 11/04/2017 0917   MCHC 31.3 11/04/2017 0917   RDW 13.3 11/04/2017 0917   LYMPHSABS 1.7 11/04/2017 0917   MONOABS 0.4 11/04/2017 0917   EOSABS 0.2 11/04/2017 0917   BASOSABS 0.0 11/04/2017 0917   CMP Latest Ref Rng & Units 11/04/2017 11/08/2016 04/16/2016  Glucose 70 - 99 mg/dL 104(H) 98 98  BUN 8 - 23 mg/dL 25(H) 26(H) 24(H)  Creatinine 0.44 - 1.00 mg/dL 0.81 0.96 0.85  Sodium 135 - 145 mmol/L 141 144 138  Potassium 3.5 - 5.1 mmol/L 4.2 4.5 4.8  Chloride 98  - 111 mmol/L 107 100(L) 105  CO2 22 - 32 mmol/L 26 27 29   Calcium 8.9 - 10.3 mg/dL 9.0 10.1 9.3  Total Protein 6.5 - 8.1 g/dL 6.9 6.9 7.0  Total Bilirubin 0.3 - 1.2 mg/dL 0.7 0.5 0.4  Alkaline Phos 38 - 126 U/L 58 67 75  AST 15 - 41 U/L 17 19 19   ALT 0 - 44 U/L 19 22 28           ASSESSMENT & PLAN:   Ovarian cancer on right 1.  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 - 6 cycles of carboplatin and paclitaxel from 07/15/2011 through 11/04/2011. - Denies any abdominal bloating or pains.  Physical examination was within normal limits. - She will come back in 1 year for follow-up.  I have reviewed her labs today.  CA 125 is within normal limits.  2. peripheral neuropathy: -She has chemotherapy-induced peripheral neuropathy with numbness in the feet. -She has tried gabapentin in the past but could not tolerate it.  She gets pains in her feet when she stands for long time or walks for long time. -We will start her on Lyrica 25 twice daily and titrated up as needed.  When she has to stand for a long, she takes 1 tablet of hydrocodone as needed.  3.  Health maintenance: -Mammogram on 10/14/2017 was BI-RADS Category 1.      Orders placed this encounter:  Orders Placed This Encounter  Procedures  . CBC with Differential/Platelet  . Comprehensive metabolic panel  . CA 125      [place]  [place], [place] [phone]

## 2017-12-29 DIAGNOSIS — J019 Acute sinusitis, unspecified: Secondary | ICD-10-CM | POA: Diagnosis not present

## 2017-12-29 DIAGNOSIS — H6993 Unspecified Eustachian tube disorder, bilateral: Secondary | ICD-10-CM | POA: Diagnosis not present

## 2018-06-13 DIAGNOSIS — Z0001 Encounter for general adult medical examination with abnormal findings: Secondary | ICD-10-CM | POA: Diagnosis not present

## 2018-06-13 DIAGNOSIS — Z1389 Encounter for screening for other disorder: Secondary | ICD-10-CM | POA: Diagnosis not present

## 2018-06-23 DIAGNOSIS — J302 Other seasonal allergic rhinitis: Secondary | ICD-10-CM | POA: Diagnosis not present

## 2018-06-23 DIAGNOSIS — J0100 Acute maxillary sinusitis, unspecified: Secondary | ICD-10-CM | POA: Diagnosis not present

## 2018-07-20 DIAGNOSIS — Z0001 Encounter for general adult medical examination with abnormal findings: Secondary | ICD-10-CM | POA: Diagnosis not present

## 2018-07-20 DIAGNOSIS — E7849 Other hyperlipidemia: Secondary | ICD-10-CM | POA: Diagnosis not present

## 2018-07-20 DIAGNOSIS — I10 Essential (primary) hypertension: Secondary | ICD-10-CM | POA: Diagnosis not present

## 2018-07-20 DIAGNOSIS — E782 Mixed hyperlipidemia: Secondary | ICD-10-CM | POA: Diagnosis not present

## 2018-07-24 DIAGNOSIS — H6121 Impacted cerumen, right ear: Secondary | ICD-10-CM | POA: Diagnosis not present

## 2018-07-24 DIAGNOSIS — Z1389 Encounter for screening for other disorder: Secondary | ICD-10-CM | POA: Diagnosis not present

## 2018-10-10 DIAGNOSIS — J302 Other seasonal allergic rhinitis: Secondary | ICD-10-CM | POA: Diagnosis not present

## 2018-10-10 DIAGNOSIS — J310 Chronic rhinitis: Secondary | ICD-10-CM | POA: Diagnosis not present

## 2018-10-17 DIAGNOSIS — N951 Menopausal and female climacteric states: Secondary | ICD-10-CM | POA: Diagnosis not present

## 2018-10-17 DIAGNOSIS — I10 Essential (primary) hypertension: Secondary | ICD-10-CM | POA: Diagnosis not present

## 2018-10-17 DIAGNOSIS — J329 Chronic sinusitis, unspecified: Secondary | ICD-10-CM | POA: Diagnosis not present

## 2018-10-20 ENCOUNTER — Other Ambulatory Visit (HOSPITAL_COMMUNITY): Payer: Self-pay | Admitting: Family Medicine

## 2018-10-20 DIAGNOSIS — Z1231 Encounter for screening mammogram for malignant neoplasm of breast: Secondary | ICD-10-CM

## 2018-10-25 ENCOUNTER — Ambulatory Visit (HOSPITAL_COMMUNITY): Payer: Medicare Other

## 2018-10-25 ENCOUNTER — Ambulatory Visit (HOSPITAL_COMMUNITY)
Admission: RE | Admit: 2018-10-25 | Discharge: 2018-10-25 | Disposition: A | Discharge status: Home / Self Care | Payer: Medicare Other | Source: Ambulatory Visit | Attending: Family Medicine | Admitting: Family Medicine

## 2018-10-25 ENCOUNTER — Other Ambulatory Visit: Payer: Self-pay

## 2018-10-25 DIAGNOSIS — Z1231 Encounter for screening mammogram for malignant neoplasm of breast: Secondary | ICD-10-CM | POA: Insufficient documentation

## 2018-11-04 DIAGNOSIS — I10 Essential (primary) hypertension: Secondary | ICD-10-CM | POA: Diagnosis not present

## 2018-11-04 DIAGNOSIS — E782 Mixed hyperlipidemia: Secondary | ICD-10-CM | POA: Diagnosis not present

## 2018-11-16 ENCOUNTER — Other Ambulatory Visit (HOSPITAL_COMMUNITY): Payer: Self-pay | Admitting: *Deleted

## 2018-11-16 DIAGNOSIS — C569 Malignant neoplasm of unspecified ovary: Secondary | ICD-10-CM

## 2018-11-16 DIAGNOSIS — C561 Malignant neoplasm of right ovary: Secondary | ICD-10-CM

## 2018-11-17 ENCOUNTER — Other Ambulatory Visit: Payer: Self-pay

## 2018-11-17 ENCOUNTER — Inpatient Hospital Stay (HOSPITAL_COMMUNITY): Payer: Medicare Other | Attending: Hematology

## 2018-11-17 DIAGNOSIS — G620 Drug-induced polyneuropathy: Secondary | ICD-10-CM | POA: Insufficient documentation

## 2018-11-17 DIAGNOSIS — Z9221 Personal history of antineoplastic chemotherapy: Secondary | ICD-10-CM | POA: Insufficient documentation

## 2018-11-17 DIAGNOSIS — M791 Myalgia, unspecified site: Secondary | ICD-10-CM | POA: Diagnosis not present

## 2018-11-17 DIAGNOSIS — C561 Malignant neoplasm of right ovary: Secondary | ICD-10-CM

## 2018-11-17 DIAGNOSIS — Z8543 Personal history of malignant neoplasm of ovary: Secondary | ICD-10-CM | POA: Diagnosis present

## 2018-11-17 DIAGNOSIS — M2550 Pain in unspecified joint: Secondary | ICD-10-CM | POA: Insufficient documentation

## 2018-11-17 DIAGNOSIS — Z7982 Long term (current) use of aspirin: Secondary | ICD-10-CM | POA: Insufficient documentation

## 2018-11-17 DIAGNOSIS — Z79899 Other long term (current) drug therapy: Secondary | ICD-10-CM | POA: Diagnosis not present

## 2018-11-17 DIAGNOSIS — T451X5A Adverse effect of antineoplastic and immunosuppressive drugs, initial encounter: Secondary | ICD-10-CM | POA: Diagnosis not present

## 2018-11-17 DIAGNOSIS — Z90710 Acquired absence of both cervix and uterus: Secondary | ICD-10-CM | POA: Diagnosis not present

## 2018-11-17 DIAGNOSIS — Z90722 Acquired absence of ovaries, bilateral: Secondary | ICD-10-CM | POA: Diagnosis not present

## 2018-11-17 DIAGNOSIS — C569 Malignant neoplasm of unspecified ovary: Secondary | ICD-10-CM

## 2018-11-17 LAB — COMPREHENSIVE METABOLIC PANEL WITH GFR
ALT: 30 U/L (ref 0–44)
AST: 21 U/L (ref 15–41)
Albumin: 4.1 g/dL (ref 3.5–5.0)
Alkaline Phosphatase: 55 U/L (ref 38–126)
Anion gap: 10 (ref 5–15)
BUN: 22 mg/dL (ref 8–23)
CO2: 24 mmol/L (ref 22–32)
Calcium: 9.5 mg/dL (ref 8.9–10.3)
Chloride: 106 mmol/L (ref 98–111)
Creatinine, Ser: 0.8 mg/dL (ref 0.44–1.00)
GFR calc Af Amer: >60 mL/min
GFR calc non Af Amer: >60 mL/min
Glucose, Bld: 102 mg/dL — ABNORMAL HIGH (ref 70–99)
Potassium: 4.4 mmol/L (ref 3.5–5.1)
Sodium: 140 mmol/L (ref 135–145)
Total Bilirubin: 0.5 mg/dL (ref 0.3–1.2)
Total Protein: 7.3 g/dL (ref 6.5–8.1)

## 2018-11-17 LAB — CBC WITH DIFFERENTIAL/PLATELET
Abs Immature Granulocytes: 0.01 K/uL (ref 0.00–0.07)
Basophils Absolute: 0 K/uL (ref 0.0–0.1)
Basophils Relative: 1 %
Eosinophils Absolute: 0.2 K/uL (ref 0.0–0.5)
Eosinophils Relative: 5 %
HCT: 42.5 % (ref 36.0–46.0)
Hemoglobin: 13.6 g/dL (ref 12.0–15.0)
Immature Granulocytes: 0 %
Lymphocytes Relative: 36 %
Lymphs Abs: 1.7 K/uL (ref 0.7–4.0)
MCH: 32.7 pg (ref 26.0–34.0)
MCHC: 32 g/dL (ref 30.0–36.0)
MCV: 102.2 fL — ABNORMAL HIGH (ref 80.0–100.0)
Monocytes Absolute: 0.4 K/uL (ref 0.1–1.0)
Monocytes Relative: 8 %
Neutro Abs: 2.4 K/uL (ref 1.7–7.7)
Neutrophils Relative %: 50 %
Platelets: 199 K/uL (ref 150–400)
RBC: 4.16 MIL/uL (ref 3.87–5.11)
RDW: 13.8 % (ref 11.5–15.5)
WBC: 4.7 K/uL (ref 4.0–10.5)
nRBC: 0 % (ref 0.0–0.2)

## 2018-11-23 ENCOUNTER — Other Ambulatory Visit: Payer: Self-pay

## 2018-11-23 ENCOUNTER — Inpatient Hospital Stay (HOSPITAL_COMMUNITY): Payer: Medicare Other

## 2018-11-23 ENCOUNTER — Inpatient Hospital Stay (HOSPITAL_BASED_OUTPATIENT_CLINIC_OR_DEPARTMENT_OTHER): Payer: Medicare Other | Admitting: Hematology

## 2018-11-23 VITALS — BP 141/76 | HR 62 | Temp 97.6°F | Resp 18 | Wt 224.7 lb

## 2018-11-23 DIAGNOSIS — C561 Malignant neoplasm of right ovary: Secondary | ICD-10-CM | POA: Diagnosis not present

## 2018-11-23 DIAGNOSIS — G620 Drug-induced polyneuropathy: Secondary | ICD-10-CM | POA: Diagnosis not present

## 2018-11-23 DIAGNOSIS — Z79899 Other long term (current) drug therapy: Secondary | ICD-10-CM | POA: Diagnosis not present

## 2018-11-23 DIAGNOSIS — M791 Myalgia, unspecified site: Secondary | ICD-10-CM | POA: Diagnosis not present

## 2018-11-23 DIAGNOSIS — Z9221 Personal history of antineoplastic chemotherapy: Secondary | ICD-10-CM | POA: Diagnosis not present

## 2018-11-23 DIAGNOSIS — T451X5A Adverse effect of antineoplastic and immunosuppressive drugs, initial encounter: Secondary | ICD-10-CM | POA: Diagnosis not present

## 2018-11-23 DIAGNOSIS — Z7982 Long term (current) use of aspirin: Secondary | ICD-10-CM | POA: Diagnosis not present

## 2018-11-23 DIAGNOSIS — Z8543 Personal history of malignant neoplasm of ovary: Secondary | ICD-10-CM | POA: Diagnosis not present

## 2018-11-23 DIAGNOSIS — M2550 Pain in unspecified joint: Secondary | ICD-10-CM | POA: Diagnosis not present

## 2018-11-23 MED ORDER — ZINC OXIDE 40 % EX PSTE: 1.0000 g | PASTE | Freq: Every day | CUTANEOUS | 6 refills | Status: DC | PRN

## 2018-11-23 NOTE — Assessment & Plan Note (Signed)
1.  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 - 6 cycles of carboplatin and paclitaxel from 07/15/2011 through 11/04/2011. - Denies any abdominal bloating or pains.  Physical examination was within normal limits. - Labs are stable. CA-125 is pending.  -She will return to clinic in 1 year.  2. peripheral neuropathy: -She has chemotherapy-induced peripheral neuropathy with numbness in the feet. -She has tried gabapentin in the past but could not tolerate it.  She gets pains in her feet when she stands for long time or walks for long time. -When she has to stand for a long, she takes 1 tablet of hydrocodone as needed.  3.  Health maintenance: -Mammogram on 10/26/2018 was BI-RADS Category 1.
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
## 2018-11-24 ENCOUNTER — Ambulatory Visit (HOSPITAL_COMMUNITY): Payer: Medicare Other | Admitting: Hematology

## 2018-11-24 LAB — CA 125: Cancer Antigen (CA) 125: 16.3 U/mL (ref 0.0–38.1)

## 2018-12-04 DIAGNOSIS — E7849 Other hyperlipidemia: Secondary | ICD-10-CM | POA: Diagnosis not present

## 2018-12-04 DIAGNOSIS — I10 Essential (primary) hypertension: Secondary | ICD-10-CM | POA: Diagnosis not present

## 2018-12-15 DIAGNOSIS — J3089 Other allergic rhinitis: Secondary | ICD-10-CM | POA: Diagnosis not present

## 2018-12-15 DIAGNOSIS — Z23 Encounter for immunization: Secondary | ICD-10-CM | POA: Diagnosis not present

## 2018-12-15 DIAGNOSIS — H6993 Unspecified Eustachian tube disorder, bilateral: Secondary | ICD-10-CM | POA: Diagnosis not present

## 2019-01-04 DIAGNOSIS — E7849 Other hyperlipidemia: Secondary | ICD-10-CM | POA: Diagnosis not present

## 2019-01-04 DIAGNOSIS — I10 Essential (primary) hypertension: Secondary | ICD-10-CM | POA: Diagnosis not present

## 2019-02-14 DIAGNOSIS — M160 Bilateral primary osteoarthritis of hip: Secondary | ICD-10-CM | POA: Diagnosis not present

## 2019-02-14 DIAGNOSIS — M170 Bilateral primary osteoarthritis of knee: Secondary | ICD-10-CM | POA: Diagnosis not present

## 2019-02-14 DIAGNOSIS — M7061 Trochanteric bursitis, right hip: Secondary | ICD-10-CM | POA: Diagnosis not present

## 2019-04-04 DIAGNOSIS — I10 Essential (primary) hypertension: Secondary | ICD-10-CM | POA: Diagnosis not present

## 2019-04-04 DIAGNOSIS — E7849 Other hyperlipidemia: Secondary | ICD-10-CM | POA: Diagnosis not present

## 2019-06-04 DIAGNOSIS — E7849 Other hyperlipidemia: Secondary | ICD-10-CM | POA: Diagnosis not present

## 2019-06-04 DIAGNOSIS — M1990 Unspecified osteoarthritis, unspecified site: Secondary | ICD-10-CM | POA: Diagnosis not present

## 2019-06-04 DIAGNOSIS — I10 Essential (primary) hypertension: Secondary | ICD-10-CM | POA: Diagnosis not present

## 2019-07-04 DIAGNOSIS — I10 Essential (primary) hypertension: Secondary | ICD-10-CM | POA: Diagnosis not present

## 2019-07-04 DIAGNOSIS — M1990 Unspecified osteoarthritis, unspecified site: Secondary | ICD-10-CM | POA: Diagnosis not present

## 2019-07-04 DIAGNOSIS — E7849 Other hyperlipidemia: Secondary | ICD-10-CM | POA: Diagnosis not present

## 2019-08-03 DIAGNOSIS — M25572 Pain in left ankle and joints of left foot: Secondary | ICD-10-CM | POA: Diagnosis not present

## 2019-09-04 DIAGNOSIS — Z1389 Encounter for screening for other disorder: Secondary | ICD-10-CM | POA: Diagnosis not present

## 2019-09-04 DIAGNOSIS — Z0000 Encounter for general adult medical examination without abnormal findings: Secondary | ICD-10-CM | POA: Diagnosis not present

## 2019-09-04 DIAGNOSIS — E782 Mixed hyperlipidemia: Secondary | ICD-10-CM | POA: Diagnosis not present

## 2019-09-07 DIAGNOSIS — M25572 Pain in left ankle and joints of left foot: Secondary | ICD-10-CM | POA: Diagnosis not present

## 2019-09-19 ENCOUNTER — Encounter (HOSPITAL_COMMUNITY): Payer: Self-pay | Admitting: Physical Therapy

## 2019-09-19 ENCOUNTER — Other Ambulatory Visit: Payer: Self-pay

## 2019-09-19 ENCOUNTER — Ambulatory Visit (HOSPITAL_COMMUNITY): Payer: Medicare Other | Attending: Orthopedic Surgery | Admitting: Physical Therapy

## 2019-09-19 DIAGNOSIS — M6281 Muscle weakness (generalized): Secondary | ICD-10-CM | POA: Diagnosis not present

## 2019-09-19 DIAGNOSIS — M25572 Pain in left ankle and joints of left foot: Secondary | ICD-10-CM | POA: Insufficient documentation

## 2019-09-19 DIAGNOSIS — R29898 Other symptoms and signs involving the musculoskeletal system: Secondary | ICD-10-CM | POA: Diagnosis not present

## 2019-09-19 DIAGNOSIS — R2689 Other abnormalities of gait and mobility: Secondary | ICD-10-CM

## 2019-09-19 NOTE — Patient Instructions (Signed)
Access Code: [facility] URL: [email or web address] Date: 09/19/2019 Prepared by: [facility] Medical illustrator on Wall - 1 x daily - 7 x weekly - 3 reps - 30 seconds hold

## 2019-09-19 NOTE — Therapy (Signed)
[place], [place], [address] Phone: [phone]   Fax:  [phone]  Physical Therapy Evaluation  Patient Details  Name: Sarah Walker MRN: [id number] Date of Birth: 22-Aug-1950 Referring Provider (PT): Edmonia Lynch MD   Encounter Date: 09/19/2019   PT End of Session - 09/19/19 1043    Visit Number 1    Number of Visits 8    Date for PT Re-Evaluation 10/17/19    Authorization Type primary: [employer] Medicare ( No V.L. No auth) secondary [place] [facility] (No auth, no vl)    PT Start Time 0815    PT Stop Time [phone]    PT Time Calculation (min) 40 min    Activity Tolerance Patient tolerated treatment well    Behavior During Therapy WFL for tasks assessed/performed           Past Medical History:  Diagnosis Date  . Abdominal hernia   . Allergic rhinitis   . Anemia associated with chemotherapy 09/16/2011  . Bone spur of other site    left foot  . Dry eyes   . Family history of kidney cancer   . Family history of prostate cancer   . Hypertension   . Ovarian cancer ([place])   . Ovarian cancer on right ([place]) 06/17/2011  . Peripheral neuropathy 08/26/2011   Chemotherapy-induced  . Pulmonary embolism ([place])   . Sinusitis     Past Surgical History:  Procedure Laterality Date  . ABDOMINAL HYSTERECTOMY  05/25/2011   [place] bs&o and cancer staging  . CHOLECYSTECTOMY  1990  . COLONOSCOPY N/A 09/06/2014   Procedure: COLONOSCOPY;  Surgeon: Rogene Houston, MD;  [facility];  Service: Endoscopy;  Laterality: N/A;  930 - moved to 9/2 @ 8:25  . INCISIONAL HERNIA REPAIR  10/15/2014  . INCISIONAL HERNIA REPAIR  10/15/2014   Procedure: OPEN INCISIONAL HERNIA REPAIR WITH MESH AND MYOFASCIAL FLAPS;  Surgeon: Erroll Luna, MD;  Location: [facility];  Service: General;;  . IVC filter  05/24/11  . PORT-A-CATH REMOVAL Right 11/21/2013   Procedure: MINOR REMOVAL PORT-A-CATH;  Surgeon: Jamesetta So, MD;  Location: [facility];  Service:  General;  Laterality: Right;  . PORTACATH PLACEMENT  06/16/11   Done at [facility]  . TONSILLECTOMY     age 39's  . TUBAL LIGATION  1981    There were no vitals filed for this visit.    Subjective Assessment - 09/19/19 0821    Subjective Patient is a 69 y.o. female who presents to physical therapy with c/o Achilles tendonitis which began 3 months ago with gradual increase. She states she has tendonitis in L ankle. They gave her exercises which helps. She broke her ankle in the front years ago. She had chemo which gave her neuropathy. The swelling is not painful. They gave her some stretches and heel raises. She states she continues to have tightness in the back of her ankle. She continues to have difficulty with walking and weightbearing. Her exercises have helped decrease symptoms. Her main goal is to get rid of tightness.    Limitations Standing;Walking;House hold activities    How long can you walk comfortably? 3-4 minutes    Patient Stated Goals decrease ankle tightness    Currently in Pain? No/denies   worst 3/10             [facility] PT Assessment - 09/19/19 0001      Assessment   Medical Diagnosis L Achilles  Tendonitis    Referring Provider (PT) Edmonia Lynch MD    Onset Date/Surgical Date 06/20/19    Next MD Visit October    Prior Therapy Yes      Precautions   Precautions None      Restrictions   Weight Bearing Restrictions No      Balance Screen   Has the patient fallen in the past 6 months No    Has the patient had a decrease in activity level because of a fear of falling?  No    Is the patient reluctant to leave their home because of a fear of falling?  No      Prior Function   Level of Independence Independent    Vocation Retired      Charity fundraiser Status Within Functional Limits for tasks assessed      Observation/Other Assessments   Observations Ambulates without AD, antalgic gait    Focus on Therapeutic Outcomes (FOTO)  n/a       ROM / Strength   AROM / PROM / Strength AROM;Strength      AROM   AROM Assessment Site Ankle    Right/Left Ankle Right;Left    Right Ankle Dorsiflexion 11    Right Ankle Plantar Flexion 47    Right Ankle Inversion 35    Right Ankle Eversion 30    Left Ankle Dorsiflexion 12    Left Ankle Plantar Flexion 53    Left Ankle Inversion 25    Left Ankle Eversion 35      Strength   Overall Strength Comments slight pain with ankle inversion, eversion, DF on LLE    Strength Assessment Site Hip;Knee;Ankle    Right/Left Hip Right;Left    Right Hip Flexion 3+/5    Left Hip Flexion 3+/5    Right/Left Knee Right;Left    Right Knee Flexion 4+/5    Right Knee Extension 4+/5    Left Knee Flexion 4+/5    Left Knee Extension 4+/5    Right/Left Ankle Right;Left    Right Ankle Dorsiflexion 5/5    Right Ankle Inversion 5/5    Right Ankle Eversion 5/5    Left Ankle Dorsiflexion 5/5    Left Ankle Inversion 4+/5    Left Ankle Eversion 4+/5      Palpation   Palpation comment TTP L achilles tendon, ATFL, inferior to lateral maleoleus      Ambulation/Gait   Ambulation/Gait Yes    Ambulation/Gait Assistance 6: Modified independent (Device/Increase time)    Ambulation Distance (Feet) 235 Feet    Assistive device None    Gait Pattern Step-through pattern;Decreased step length - right;Decreased step length - left;Decreased dorsiflexion - left;Trendelenburg;Wide base of support    Ambulation Surface Level;Indoor    Gait Comments 2MWT, decreased L ankle movement throughout, bilateral LE in ER                      Objective measurements completed on examination: See above findings.       [facility] PT Treatment/Exercise - 09/19/19 0001      Exercises   Exercises Ankle      Ankle Exercises: Stretches   Other Stretch calf stretch at wall 3x 30 second holds                  PT Education - 09/19/19 0823    Education Details Patient educated on exam findings, POC, scope of  PT, initial HEP, getting referral  for hip pain, achilles tendon treatment    Person(s) Educated Patient    Methods Explanation;Demonstration;Handout    Comprehension Verbalized understanding;Returned demonstration            PT Short Term Goals - 09/19/19 1050      PT SHORT TERM GOAL #1   Title Patient will be independent with HEP in order to improve functional outcomes.    Time 2    Period Weeks    Status New    Target Date 10/03/19      PT SHORT TERM GOAL #2   Title Patient will report at least 25% improvement in symptoms for improved quality of life.    Time 2    Period Weeks    Status New    Target Date 10/03/19             PT Long Term Goals - 09/19/19 1050      PT LONG TERM GOAL #1   Title Patient will report at least 75% improvement in symptoms for improved quality of life.    Time 4    Period Weeks    Status New    Target Date 10/17/19      PT LONG TERM GOAL #2   Title Patient will be able to walk unrestricted by L ankle symptoms in order to demonstrate improved commmunity ambulation.    Time 4    Period Weeks    Status New    Target Date 10/17/19                  Plan - 09/19/19 1238    Clinical Impression Statement Patient is a 69 y.o. female who presents to physical therapy with c/o Achilles tendonitis which began 3 months ago with gradual increase. She presents with pain limited deficits in L ankle strength, ROM, endurance, gait, balance, and functional mobility with ADL. She is having to modify and restrict ADL as indicated by subjective information and objective measures which is affecting overall participation. Patient will benefit from skilled physical therapy in order to improve function and reduce impairment.    Personal Factors and Comorbidities Comorbidity 3+;Past/Current Experience;Age;Fitness;Time since onset of injury/illness/exacerbation    Comorbidities obesity, R hip pain, neuropathy, hx cancer    Examination-Activity Limitations  Bend;Stairs;Stand;Squat;Locomotion Level;Transfers    Examination-Participation Restrictions Cleaning;Community Activity;Meal Prep;Shop;Volunteer;Dorita Sciara    Stability/Clinical Decision Making Stable/Uncomplicated    Clinical Decision Making Low    Rehab Potential Good    PT Frequency 2x / week    PT Duration 4 weeks    PT Treatment/Interventions ADLs/Self Care Home Management;Aquatic Therapy;Cryotherapy;Biofeedback;Electrical Stimulation;Iontophoresis 4mg /ml Dexamethasone;Moist Heat;Traction;Parrafin;Fluidtherapy;Contrast Bath;DME Instruction;Gait training;Stair training;Functional mobility training;Therapeutic activities;Ultrasound;Therapeutic exercise;Balance training;Neuromuscular re-education;Patient/family education;Orthotic Fit/Training;Manual techniques;Manual lymph drainage;Compression bandaging;Passive range of motion;Scar mobilization;Dry needling;Energy conservation;Splinting;Taping;Vasopneumatic Device;Spinal Manipulations;Joint Manipulations    PT Next Visit Plan continue with calf stretch, begin gastroc/solues strengthening, possibly begin mobs/stm; possibly balance/gait training    PT Home Exercise Plan 9/15 calf stretch at wall    Consulted and Agree with Plan of Care Patient           Patient will benefit from skilled therapeutic intervention in order to improve the following deficits and impairments:  Abnormal gait, Decreased activity tolerance, Decreased balance, Decreased mobility, Decreased knowledge of use of DME, Difficulty walking, Decreased range of motion, Impaired perceived functional ability  Visit Diagnosis: Pain in left ankle and joints of left foot  Muscle weakness (generalized)  Other abnormalities of gait and mobility  Other symptoms and signs involving  the musculoskeletal system     Problem List Patient Active Problem List   Diagnosis Date Noted  . Vaginal Pap smear following hysterectomy for malignancy 03/02/2017  . Screening for  colorectal cancer 03/02/2017  . Vaginal atrophy 03/02/2017  . History of ovarian cancer 03/02/2017  . Genetic testing 05/29/2015  . Family history of kidney cancer   . Family history of prostate cancer   . Chemotherapy-induced neuropathy ([place]) 03/31/2015  . Incisional hernia, incarcerated 10/15/2014  . Ventral hernia 07/15/2014  . Ovarian cancer on right ([place]) 06/17/2011  . Hypertension 06/17/2011  . H/O hysterectomy with oophorectomy 06/17/2011    12:45 PM, 09/19/19 Mearl Latin PT, DPT Physical Therapist at [place], [place], [address] Phone: [phone]   Fax:  [phone]  Name: BLAKLEY MICHNA MRN: [id number] Date of Birth: February 22, 1950

## 2019-09-20 DIAGNOSIS — J0100 Acute maxillary sinusitis, unspecified: Secondary | ICD-10-CM | POA: Diagnosis not present

## 2019-09-20 DIAGNOSIS — R42 Dizziness and giddiness: Secondary | ICD-10-CM | POA: Diagnosis not present

## 2019-09-20 DIAGNOSIS — Z681 Body mass index (BMI) 19 or less, adult: Secondary | ICD-10-CM | POA: Diagnosis not present

## 2019-09-20 DIAGNOSIS — J3089 Other allergic rhinitis: Secondary | ICD-10-CM | POA: Diagnosis not present

## 2019-10-01 ENCOUNTER — Ambulatory Visit (HOSPITAL_COMMUNITY): Payer: Medicare Other | Admitting: Physical Therapy

## 2019-10-02 ENCOUNTER — Telehealth (HOSPITAL_COMMUNITY): Payer: Self-pay | Admitting: Physical Therapy

## 2019-10-02 DIAGNOSIS — J329 Chronic sinusitis, unspecified: Secondary | ICD-10-CM | POA: Diagnosis not present

## 2019-10-02 DIAGNOSIS — I10 Essential (primary) hypertension: Secondary | ICD-10-CM | POA: Diagnosis not present

## 2019-10-02 NOTE — Telephone Encounter (Signed)
No show for 10/02/19 apt. Called and spoke with family member about missed apt as patient was out at [facility] apt. Confirmed that patient would call back to confirm or cancel her apt for tomorrow once she returned.   9:23 AM, 10/02/19 Jerene Pitch, DPT Physical Therapy with [facility]  [phone] office

## 2019-10-03 ENCOUNTER — Encounter (HOSPITAL_COMMUNITY): Payer: Self-pay | Admitting: Physical Therapy

## 2019-10-03 ENCOUNTER — Encounter (HOSPITAL_COMMUNITY): Payer: Medicare Other | Admitting: Physical Therapy

## 2019-10-03 NOTE — Therapy (Signed)
[place], [place], [address] Phone: [phone]   Fax:  [phone]  Patient Details  Name: Sarah Walker MRN: [id number] Date of Birth: 1950/03/15 Referring Provider:  No ref. provider found  Encounter Date: 10/03/2019   PHYSICAL THERAPY DISCHARGE SUMMARY  Visits from Start of Care: 1  Current functional level related to goals / functional outcomes: Patient feeling better and wishes to be discharged.   Remaining deficits: Patient feeling better and wishes to be discharged.   Education / Equipment: HEP  Plan: Patient agrees to discharge.  Patient goals were not met. Patient is being discharged due to the physician's request.  ?????       10:14 AM, 10/03/19 Mearl Latin PT, DPT Physical Therapist at [place], [place], [address] Phone: [phone]   Fax:  [phone]

## 2019-10-04 DIAGNOSIS — E7849 Other hyperlipidemia: Secondary | ICD-10-CM | POA: Diagnosis not present

## 2019-10-04 DIAGNOSIS — I10 Essential (primary) hypertension: Secondary | ICD-10-CM | POA: Diagnosis not present

## 2019-10-08 ENCOUNTER — Encounter (HOSPITAL_COMMUNITY): Payer: Medicare Other | Admitting: Physical Therapy

## 2019-10-10 ENCOUNTER — Encounter (HOSPITAL_COMMUNITY): Payer: Medicare Other

## 2019-10-15 ENCOUNTER — Encounter (HOSPITAL_COMMUNITY): Payer: Medicare Other | Admitting: Physical Therapy

## 2019-10-17 ENCOUNTER — Encounter (HOSPITAL_COMMUNITY): Payer: Medicare Other

## 2019-10-22 ENCOUNTER — Encounter (HOSPITAL_COMMUNITY): Payer: Medicare Other | Admitting: Physical Therapy

## 2019-10-24 ENCOUNTER — Encounter (HOSPITAL_COMMUNITY): Payer: Medicare Other | Admitting: Physical Therapy

## 2019-11-03 DIAGNOSIS — E7849 Other hyperlipidemia: Secondary | ICD-10-CM | POA: Diagnosis not present

## 2019-11-03 DIAGNOSIS — I10 Essential (primary) hypertension: Secondary | ICD-10-CM | POA: Diagnosis not present

## 2019-11-15 ENCOUNTER — Other Ambulatory Visit: Payer: Self-pay

## 2019-11-15 ENCOUNTER — Inpatient Hospital Stay (HOSPITAL_COMMUNITY): Payer: Medicare Other | Attending: Hematology

## 2019-11-15 DIAGNOSIS — Z90722 Acquired absence of ovaries, bilateral: Secondary | ICD-10-CM | POA: Diagnosis not present

## 2019-11-15 DIAGNOSIS — Z8543 Personal history of malignant neoplasm of ovary: Secondary | ICD-10-CM | POA: Insufficient documentation

## 2019-11-15 DIAGNOSIS — R232 Flushing: Secondary | ICD-10-CM | POA: Diagnosis not present

## 2019-11-15 DIAGNOSIS — R42 Dizziness and giddiness: Secondary | ICD-10-CM | POA: Diagnosis not present

## 2019-11-15 DIAGNOSIS — Z86711 Personal history of pulmonary embolism: Secondary | ICD-10-CM | POA: Diagnosis not present

## 2019-11-15 DIAGNOSIS — D6481 Anemia due to antineoplastic chemotherapy: Secondary | ICD-10-CM | POA: Insufficient documentation

## 2019-11-15 DIAGNOSIS — Z90710 Acquired absence of both cervix and uterus: Secondary | ICD-10-CM | POA: Diagnosis not present

## 2019-11-15 DIAGNOSIS — Z9221 Personal history of antineoplastic chemotherapy: Secondary | ICD-10-CM | POA: Diagnosis not present

## 2019-11-15 DIAGNOSIS — Z7982 Long term (current) use of aspirin: Secondary | ICD-10-CM | POA: Insufficient documentation

## 2019-11-15 DIAGNOSIS — Z791 Long term (current) use of non-steroidal anti-inflammatories (NSAID): Secondary | ICD-10-CM | POA: Diagnosis not present

## 2019-11-15 DIAGNOSIS — G620 Drug-induced polyneuropathy: Secondary | ICD-10-CM | POA: Diagnosis not present

## 2019-11-15 DIAGNOSIS — T451X5A Adverse effect of antineoplastic and immunosuppressive drugs, initial encounter: Secondary | ICD-10-CM | POA: Diagnosis not present

## 2019-11-15 DIAGNOSIS — Z79899 Other long term (current) drug therapy: Secondary | ICD-10-CM | POA: Insufficient documentation

## 2019-11-15 DIAGNOSIS — I10 Essential (primary) hypertension: Secondary | ICD-10-CM | POA: Insufficient documentation

## 2019-11-15 DIAGNOSIS — Z923 Personal history of irradiation: Secondary | ICD-10-CM | POA: Diagnosis not present

## 2019-11-15 DIAGNOSIS — C561 Malignant neoplasm of right ovary: Secondary | ICD-10-CM

## 2019-11-15 LAB — CBC WITH DIFFERENTIAL/PLATELET
Abs Immature Granulocytes: 0.01 10*3/uL (ref 0.00–0.07)
Basophils Absolute: 0 10*3/uL (ref 0.0–0.1)
Basophils Relative: 1 %
Eosinophils Absolute: 0.3 10*3/uL (ref 0.0–0.5)
Eosinophils Relative: 7 %
HCT: 38.9 % (ref 36.0–46.0)
Hemoglobin: 12.3 g/dL (ref 12.0–15.0)
Immature Granulocytes: 0 %
Lymphocytes Relative: 31 %
Lymphs Abs: 1.4 10*3/uL (ref 0.7–4.0)
MCH: 32.2 pg (ref 26.0–34.0)
MCHC: 31.6 g/dL (ref 30.0–36.0)
MCV: 101.8 fL — ABNORMAL HIGH (ref 80.0–100.0)
Monocytes Absolute: 0.5 10*3/uL (ref 0.1–1.0)
Monocytes Relative: 11 %
Neutro Abs: 2.3 10*3/uL (ref 1.7–7.7)
Neutrophils Relative %: 50 %
Platelets: 172 10*3/uL (ref 150–400)
RBC: 3.82 MIL/uL — ABNORMAL LOW (ref 3.87–5.11)
RDW: 14.1 % (ref 11.5–15.5)
WBC: 4.5 10*3/uL (ref 4.0–10.5)
nRBC: 0 % (ref 0.0–0.2)

## 2019-11-15 LAB — COMPREHENSIVE METABOLIC PANEL
ALT: 21 U/L (ref 0–44)
AST: 26 U/L (ref 15–41)
Albumin: 3.8 g/dL (ref 3.5–5.0)
Alkaline Phosphatase: 54 U/L (ref 38–126)
Anion gap: 9 (ref 5–15)
BUN: 26 mg/dL — ABNORMAL HIGH (ref 8–23)
CO2: 24 mmol/L (ref 22–32)
Calcium: 8.9 mg/dL (ref 8.9–10.3)
Chloride: 105 mmol/L (ref 98–111)
Creatinine, Ser: 0.99 mg/dL (ref 0.44–1.00)
GFR, Estimated: >60 mL/min (ref >60)
Glucose, Bld: 93 mg/dL (ref 70–99)
Potassium: 5.3 mmol/L — ABNORMAL HIGH (ref 3.5–5.1)
Sodium: 138 mmol/L (ref 135–145)
Total Bilirubin: 0.8 mg/dL (ref 0.3–1.2)
Total Protein: 7 g/dL (ref 6.5–8.1)

## 2019-11-16 LAB — CA 125: Cancer Antigen (CA) 125: 16.9 U/mL (ref 0.0–38.1)

## 2019-11-21 NOTE — Progress Notes (Signed)
[place], [place] [address]   CLINIC:  Medical Oncology/Hematology  PCP:  [name], [place] [address] [phone]   REASON FOR VISIT:  Follow-up for Ovarian Cancer   CURRENT THERAPY: Clinical surveillance  BRIEF ONCOLOGIC HISTORY:  Oncology History  Ovarian cancer on right [facility])  05/25/2011 Surgery   [place] surgery at [facility]   05/25/2011 Initial Diagnosis   Ovarian cancer   07/15/2011 - 11/04/2011 Chemotherapy   Carboplatin/Pacliatexel x 6 cycles   12/01/2011 Remission   CT scan negative for disease   05/15/2012 Imaging   CT abd/pelvis- Negative for mass or adenopathy.      CANCER STAGING: Cancer Staging Ovarian cancer on right [facility]) Staging form: Ovary, AJCC 7th Edition - Clinical: Stage Ia - Signed by Baird Cancer, PA on 07/26/2011  I connected with Roberts Gaudy on 11/22/19 at  8:50 AM EST by telephone visit and verified that I am speaking with the correct person using two identifiers.   I discussed the limitations, risks, security and privacy concerns of performing an evaluation and management service by telemedicine and the availability of in-person appointments. I also discussed with the patient that there may be a patient responsible charge related to this service. The patient expressed understanding and agreed to proceed.   Other persons participating in the visit and their role in the encounter: None  Patient's location: Home Provider's location: Clinic   INTERVAL HISTORY:  Ms. Carl 69 y.o. female with past medical history significant for hypertension and ovarian cancer.  She is followed by Dr. 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  She received treatment and surgery back in 2013.  She follows up with [place] annually for lab work and assessment.  She was last seen in our clinic on 11/23/2018.  Her Ca125 was 16.3.  She offers  no complaints.  In the interim, she has done well.  She continues to have peripheral neuropathy in bilateral feet.  Endorses occasional hot flash and dizzy spell but this is very infrequent.  Otherwise, she denies any new symptoms.   REVIEW OF SYSTEMS:  Review of Systems  Constitutional: Negative.   HENT:  Negative.   Eyes: Negative.   Respiratory: Negative.   Cardiovascular: Negative.   Gastrointestinal: Negative.   Endocrine: Negative.   Genitourinary: Negative.    Musculoskeletal: Positive for arthralgias and myalgias.  Skin: Negative.   Neurological: Positive for dizziness and numbness.  Hematological: Negative.   Psychiatric/Behavioral: Negative.      PAST MEDICAL/SURGICAL HISTORY:  Past Medical History:  Diagnosis Date  . Abdominal hernia   . Allergic rhinitis   . Anemia associated with chemotherapy 09/16/2011  . Bone spur of other site    left foot  . Dry eyes   . Family history of kidney cancer   . Family history of prostate cancer   . Hypertension   . Ovarian cancer ([place])   . Ovarian cancer on right ([place]) 06/17/2011  . Peripheral neuropathy 08/26/2011   Chemotherapy-induced  . Pulmonary embolism ([place])   . Sinusitis    Past Surgical History:  Procedure Laterality Date  . ABDOMINAL HYSTERECTOMY  05/25/2011   [place] bs&o and cancer staging  . CHOLECYSTECTOMY  1990  . COLONOSCOPY N/A 09/06/2014   Procedure: COLONOSCOPY;  Surgeon: Rogene Houston, MD;  [facility];  Service: Endoscopy;  Laterality: N/A;  930 - moved to 9/2 @ 8:25  .  INCISIONAL HERNIA REPAIR  10/15/2014  . INCISIONAL HERNIA REPAIR  10/15/2014   Procedure: OPEN INCISIONAL HERNIA REPAIR WITH MESH AND MYOFASCIAL FLAPS;  Surgeon: Erroll Luna, MD;  Location: [facility];  Service: General;;  . IVC filter  05/24/11  . PORT-A-CATH REMOVAL Right 11/21/2013   Procedure: MINOR REMOVAL PORT-A-CATH;  Surgeon: Jamesetta So, MD;  Location: [facility];  Service: General;  Laterality: Right;  . PORTACATH  PLACEMENT  06/16/11   Done at [facility]  . TONSILLECTOMY     age 54's  . TUBAL LIGATION  1981     SOCIAL HISTORY:  Social History   Socioeconomic History  . Marital status: Married    Spouse name: Not on file  . Number of children: Not on file  . Years of education: Not on file  . Highest education level: Not on file  Occupational History  . Not on file  Tobacco Use  . Smoking status: Never Smoker  . Smokeless tobacco: Never Used  Substance and Sexual Activity  . Alcohol use: No  . Drug use: No  . Sexual activity: Yes    Birth control/protection: Surgical    Comment: hyst  Other Topics Concern  . Not on file  Social History Narrative  . Not on file   Social Determinants of Health   Financial Resource Strain:   . Difficulty of Paying Living Expenses: Not on file  Food Insecurity:   . Worried About Charity fundraiser in the Last Year: Not on file  . Ran Out of Food in the Last Year: Not on file  Transportation Needs:   . Lack of Transportation (Medical): Not on file  . Lack of Transportation (Non-Medical): Not on file  Physical Activity:   . Days of Exercise per Week: Not on file  . Minutes of Exercise per Session: Not on file  Stress:   . Feeling of Stress : Not on file  Social Connections:   . Frequency of Communication with Friends and Family: Not on file  . Frequency of Social Gatherings with Friends and Family: Not on file  . Attends Religious Services: Not on file  . Active Member of Clubs or Organizations: Not on file  . Attends Archivist Meetings: Not on file  . Marital Status: Not on file  Intimate Partner Violence:   . Fear of Current or Ex-Partner: Not on file  . Emotionally Abused: Not on file  . Physically Abused: Not on file  . Sexually Abused: Not on file    FAMILY HISTORY:  Family History  Problem Relation Age of Onset  . Stroke Mother   . Cervical cancer Mother 1  . Kidney cancer Father 80  . Aneurysm Paternal  Grandmother        50s  . Hodgkin's lymphoma Maternal Uncle 33  . Prostate cancer Paternal Uncle   . Cancer Cousin        2 female maternal first cousins with unknown cancer  . Throat cancer Paternal Uncle     CURRENT MEDICATIONS:  Outpatient Encounter Medications as of 11/22/2019  Medication Sig  . aspirin EC 81 MG tablet Take 81 mg by mouth daily.  Marland Kitchen azelastine (OPTIVAR) 0.05 % ophthalmic solution USE 1 DROP TWICE DAILY  . bisoprolol-hydrochlorothiazide (ZIAC) 5-6.25 MG per tablet Take 1 tablet by mouth daily.  . Calcium-Magnesium-Vitamin D (CALCIUM 500 PO) Take 2,000 mg by mouth.  . DHA-EPA-Vitamin E (OMEGA-3 COMPLEX PO) Take by mouth daily.  . diphenhydrAMINE (BENADRYL)  25 MG tablet Take 25 mg by mouth every 6 (six) hours as needed for itching.  . fexofenadine (ALLEGRA) 180 MG tablet Take 180 mg by mouth daily as needed for allergies or rhinitis.  . fluticasone (FLONASE) 50 MCG/ACT nasal spray Place 1 spray into both nostrils daily as needed for allergies or rhinitis.  Marland Kitchen KLOR-CON M20 20 MEQ tablet TAKE 1 TABLET BY MOUTH EVERY DAY  . meloxicam (MOBIC) 15 MG tablet Take 15 mg by mouth as needed.   . valsartan (DIOVAN) 160 MG tablet Take 160 mg by mouth daily.  . Zinc Oxide 40 % PSTE Apply 1 application topically daily as needed.   Facility-Administered Encounter Medications as of 11/22/2019  Medication  . sodium chloride 0.9 % injection 10 mL  . sodium chloride 0.9 % injection 10 mL    ALLERGIES:  Allergies  Allergen Reactions  . Codeine Nausea And Vomiting     PHYSICAL EXAM:  ECOG Performance status: 1  There were no vitals filed for this visit. There were no vitals filed for this visit.  Physical exam: Limited secondary to telephone visit.  LABORATORY DATA:  I have reviewed the labs as listed.  CBC    Component Value Date/Time   WBC 4.5 11/15/2019 0838   RBC 3.82 (L) 11/15/2019 0838   HGB 12.3 11/15/2019 0838   HCT 38.9 11/15/2019 0838   PLT 172 11/15/2019  0838   MCV 101.8 (H) 11/15/2019 0838   MCH 32.2 11/15/2019 0838   MCHC 31.6 11/15/2019 0838   RDW 14.1 11/15/2019 0838   LYMPHSABS 1.4 11/15/2019 0838   MONOABS 0.5 11/15/2019 0838   EOSABS 0.3 11/15/2019 0838   BASOSABS 0.0 11/15/2019 0838   CMP Latest Ref Rng & Units 11/15/2019 11/17/2018 11/04/2017  Glucose 70 - 99 mg/dL 93 102(H) 104(H)  BUN 8 - 23 mg/dL 26(H) 22 25(H)  Creatinine 0.44 - 1.00 mg/dL 0.99 0.80 0.81  Sodium 135 - 145 mmol/L 138 140 141  Potassium 3.5 - 5.1 mmol/L 5.3(H) 4.4 4.2  Chloride 98 - 111 mmol/L 105 106 107  CO2 22 - 32 mmol/L 24 24 26   Calcium 8.9 - 10.3 mg/dL 8.9 9.5 9.0  Total Protein 6.5 - 8.1 g/dL 7.0 7.3 6.9  Total Bilirubin 0.3 - 1.2 mg/dL 0.8 0.5 0.7  Alkaline Phos 38 - 126 U/L 54 55 58  AST 15 - 41 U/L 26 21 17   ALT 0 - 44 U/L 21 30 19        ASSESSMENT & PLAN:  1.  Stage Ia papillary serous cystadenocarcinoma of the ovary: - TAH and BSO on 05/25/2011 at [facility]. - 6 cycles of carboplatin and paclitaxel from 07/15/2011 through 11/04/2011. - Denies any abdominal bloating or pains.   - Labs are stable. CA-125 remains stable and is 16.9. -She will return to clinic in 1 year.   2. peripheral neuropathy: -She has chemotherapy-induced peripheral neuropathy with numbness in the feet. -She has tried gabapentin in the past but could not tolerate it.  She gets pains in her feet when she stands for long time or walks for long time. -When she has to stand for a long, she takes 1 tablet of hydrocodone as needed. -Could consider accupuncture- will send referral  3.  Health maintenance: -Missed her mammogram -She is calling to get this rescheduled.  4. Hyperkalemia: -Labs show an elevated Potassium level- 5.3 -On 20 meq daily -Recommend taking half a tablet daily.   Disposition: -Return to have her potassium level  checked in approximately 3 months. Larene Pickett) -RTC in 1 year for labs (cbc, cmp, ca125) and assessment.  -Referral placed  for 6 free sessions for acupuncture for her peripheral neuropathy.   I provided 15 minutes of non face-to-face telephone visit time during this encounter, and > 50% was spent counseling as documented under my assessment & plan.  No problem-specific Assessment & Plan notes found for this encounter.   Orders placed this encounter:  No orders of the defined types were placed in this encounter.  [place] [phone]

## 2019-11-22 ENCOUNTER — Inpatient Hospital Stay (HOSPITAL_BASED_OUTPATIENT_CLINIC_OR_DEPARTMENT_OTHER): Payer: Medicare Other | Admitting: Oncology

## 2019-11-22 ENCOUNTER — Other Ambulatory Visit: Payer: Self-pay

## 2019-11-22 DIAGNOSIS — E875 Hyperkalemia: Secondary | ICD-10-CM

## 2019-12-04 DIAGNOSIS — I10 Essential (primary) hypertension: Secondary | ICD-10-CM | POA: Diagnosis not present

## 2019-12-04 DIAGNOSIS — E7849 Other hyperlipidemia: Secondary | ICD-10-CM | POA: Diagnosis not present

## 2019-12-05 DIAGNOSIS — M25551 Pain in right hip: Secondary | ICD-10-CM | POA: Diagnosis not present

## 2019-12-05 DIAGNOSIS — M545 Low back pain, unspecified: Secondary | ICD-10-CM | POA: Diagnosis not present

## 2019-12-05 DIAGNOSIS — M25561 Pain in right knee: Secondary | ICD-10-CM | POA: Diagnosis not present

## 2020-01-02 DIAGNOSIS — M25551 Pain in right hip: Secondary | ICD-10-CM | POA: Diagnosis not present

## 2020-01-04 DIAGNOSIS — E7849 Other hyperlipidemia: Secondary | ICD-10-CM | POA: Diagnosis not present

## 2020-01-04 DIAGNOSIS — I10 Essential (primary) hypertension: Secondary | ICD-10-CM | POA: Diagnosis not present

## 2020-02-02 DIAGNOSIS — E7849 Other hyperlipidemia: Secondary | ICD-10-CM | POA: Diagnosis not present

## 2020-02-02 DIAGNOSIS — I10 Essential (primary) hypertension: Secondary | ICD-10-CM | POA: Diagnosis not present

## 2020-02-07 ENCOUNTER — Other Ambulatory Visit: Payer: Self-pay

## 2020-02-07 ENCOUNTER — Ambulatory Visit (INDEPENDENT_AMBULATORY_CARE_PROVIDER_SITE_OTHER): Payer: Medicare Other | Admitting: Podiatry

## 2020-02-07 ENCOUNTER — Encounter: Payer: Self-pay | Admitting: Podiatry

## 2020-02-07 ENCOUNTER — Ambulatory Visit (INDEPENDENT_AMBULATORY_CARE_PROVIDER_SITE_OTHER): Payer: Medicare Other

## 2020-02-07 DIAGNOSIS — M2141 Flat foot [pes planus] (acquired), right foot: Secondary | ICD-10-CM | POA: Diagnosis not present

## 2020-02-07 DIAGNOSIS — M2142 Flat foot [pes planus] (acquired), left foot: Secondary | ICD-10-CM | POA: Diagnosis not present

## 2020-02-07 DIAGNOSIS — M79672 Pain in left foot: Secondary | ICD-10-CM | POA: Diagnosis not present

## 2020-02-07 DIAGNOSIS — M779 Enthesopathy, unspecified: Secondary | ICD-10-CM

## 2020-02-07 DIAGNOSIS — M79671 Pain in right foot: Secondary | ICD-10-CM

## 2020-02-07 MED ORDER — TRIAMCINOLONE ACETONIDE 10 MG/ML IJ SUSP
10.0000 mg | Freq: Once | INTRAMUSCULAR | Status: AC
  Administered 2020-02-07: 10 mg

## 2020-02-08 NOTE — Progress Notes (Signed)
Subjective:   Patient ID: Sarah Walker, female   DOB: 70 y.o.   MRN: [id number]   HPI Patient presents stating she has had a lot of pain in her right ankle which is occurred over the last couple months.  Does not remember injury but states it is hard to walk with and she has tried shoe gear modifications and anti-inflammatories without relief.  Patient does not smoke likes to be active   Review of Systems  All other systems reviewed and are negative.       Objective:  Physical Exam Vitals and nursing note reviewed.  Constitutional:      Appearance: She is well-developed and well-nourished.  Cardiovascular:     Pulses: Intact distal pulses.  Pulmonary:     Effort: Pulmonary effort is normal.  Musculoskeletal:        General: Normal range of motion.  Skin:    General: Skin is warm.  Neurological:     Mental Status: She is alert.     Neurovascular status was found to be intact muscle strength was found to be adequate range of motion within normal limits.  Patient is noted to have quite a bit of discomfort in the sinus tarsi right with inflammation fluid of the joint with moderate collapse medial longitudinal arch with significant flatfoot deformity bilateral with pain of a generic nature in both feet.  Patient has good digital perfusion well oriented x3 no indications of coalition     Assessment:  Acute sinus tarsitis right with inflammation fluid buildup along with moderate flatfoot deformity bilateral      Plan:  H&P conditions reviewed x-rays reviewed with patient.  Today I did sterile prep and I injected the sinus tarsi right 3 mg dexamethasone Kenalog 5 mg Xylocaine applied fascial brace to lift up the arch gave instructions on oral anti-inflammatories physical therapy and support therapy and patient will be seen back again in 2 weeks or earlier if needed  X-rays indicate that there is significant flattening of the arch bilateral multiple signs of arthritis midtarsal joint  with collapse of the talus bilateral and no indications currently of subtalar joint arthritis coalition

## 2020-02-21 ENCOUNTER — Ambulatory Visit (INDEPENDENT_AMBULATORY_CARE_PROVIDER_SITE_OTHER): Payer: Medicare Other | Admitting: Podiatry

## 2020-02-21 ENCOUNTER — Encounter: Payer: Self-pay | Admitting: Podiatry

## 2020-02-21 ENCOUNTER — Other Ambulatory Visit: Payer: Self-pay

## 2020-02-21 DIAGNOSIS — M779 Enthesopathy, unspecified: Secondary | ICD-10-CM | POA: Diagnosis not present

## 2020-02-21 DIAGNOSIS — M722 Plantar fascial fibromatosis: Secondary | ICD-10-CM

## 2020-02-21 MED ORDER — TRIAMCINOLONE ACETONIDE 10 MG/ML IJ SUSP
10.0000 mg | Freq: Once | INTRAMUSCULAR | Status: AC
  Administered 2020-02-21: 10 mg

## 2020-02-21 NOTE — Progress Notes (Signed)
Subjective:   Patient ID: Sarah Walker, female   DOB: 70 y.o.   MRN: [id number]   HPI Patient states the joint is feeling better you worked on but I am getting pain in the bottom of my right arch that is made it hard for me to be ambulatory.  States I am very pleased so far with how the foot feels but I still get pain underneath   ROS      Objective:  Physical Exam  Neurovascular status intact with inflammation pain of the plantar fascia mid arch that is quite sore with the sinus tarsi and ankle doing much better with pain still noted upon deep palpation but overall improved     Assessment:  Plantar fasciitis right acute in nature with improved sinus tarsitis capsule right     Plan:  Educated her on the counseling I recommended topical medicine exercises good support shoes and for the plantar fascia I went ahead did sterile prep and injected the mid fascia 3 mg Dexasone Kenalog 5 mg Xylocaine advised on support and reappoint as symptoms indicate either of the 2 distinct problems that she has

## 2020-03-03 DIAGNOSIS — E7849 Other hyperlipidemia: Secondary | ICD-10-CM | POA: Diagnosis not present

## 2020-03-03 DIAGNOSIS — I10 Essential (primary) hypertension: Secondary | ICD-10-CM | POA: Diagnosis not present

## 2020-03-24 ENCOUNTER — Other Ambulatory Visit (HOSPITAL_COMMUNITY): Payer: Self-pay | Admitting: Family Medicine

## 2020-03-24 DIAGNOSIS — Z1231 Encounter for screening mammogram for malignant neoplasm of breast: Secondary | ICD-10-CM

## 2020-03-27 ENCOUNTER — Ambulatory Visit (HOSPITAL_COMMUNITY)
Admission: RE | Admit: 2020-03-27 | Discharge: 2020-03-27 | Disposition: A | Discharge status: Home / Self Care | Payer: Medicare Other | Source: Ambulatory Visit | Attending: Family Medicine | Admitting: Family Medicine

## 2020-03-27 DIAGNOSIS — Z1231 Encounter for screening mammogram for malignant neoplasm of breast: Secondary | ICD-10-CM | POA: Insufficient documentation

## 2020-04-02 DIAGNOSIS — I10 Essential (primary) hypertension: Secondary | ICD-10-CM | POA: Diagnosis not present

## 2020-04-02 DIAGNOSIS — E7849 Other hyperlipidemia: Secondary | ICD-10-CM | POA: Diagnosis not present

## 2020-04-08 DIAGNOSIS — M1990 Unspecified osteoarthritis, unspecified site: Secondary | ICD-10-CM | POA: Diagnosis not present

## 2020-04-08 DIAGNOSIS — I10 Essential (primary) hypertension: Secondary | ICD-10-CM | POA: Diagnosis not present

## 2020-04-08 DIAGNOSIS — Z01810 Encounter for preprocedural cardiovascular examination: Secondary | ICD-10-CM | POA: Diagnosis not present

## 2020-04-08 DIAGNOSIS — J310 Chronic rhinitis: Secondary | ICD-10-CM | POA: Diagnosis not present

## 2020-04-24 DIAGNOSIS — J302 Other seasonal allergic rhinitis: Secondary | ICD-10-CM | POA: Diagnosis not present

## 2020-05-03 DIAGNOSIS — I10 Essential (primary) hypertension: Secondary | ICD-10-CM | POA: Diagnosis not present

## 2020-05-03 DIAGNOSIS — E7849 Other hyperlipidemia: Secondary | ICD-10-CM | POA: Diagnosis not present

## 2020-06-03 DIAGNOSIS — I10 Essential (primary) hypertension: Secondary | ICD-10-CM | POA: Diagnosis not present

## 2020-06-03 DIAGNOSIS — E7849 Other hyperlipidemia: Secondary | ICD-10-CM | POA: Diagnosis not present

## 2020-07-03 DIAGNOSIS — I10 Essential (primary) hypertension: Secondary | ICD-10-CM | POA: Diagnosis not present

## 2020-07-03 DIAGNOSIS — E7849 Other hyperlipidemia: Secondary | ICD-10-CM | POA: Diagnosis not present

## 2020-07-21 DIAGNOSIS — M25551 Pain in right hip: Secondary | ICD-10-CM | POA: Diagnosis not present

## 2020-08-06 DIAGNOSIS — M25551 Pain in right hip: Secondary | ICD-10-CM | POA: Diagnosis not present

## 2020-08-11 NOTE — Progress Notes (Signed)
DUE TO COVID-19 ONLY ONE VISITOR IS ALLOWED TO COME WITH YOU AND STAY IN THE WAITING ROOM ONLY DURING PRE OP AND PROCEDURE DAY OF SURGERY.  2 VISITOR  MAY VISIT WITH YOU AFTER SURGERY IN YOUR PRIVATE ROOM DURING VISITING HOURS ONLY!  YOU NEED TO HAVE A COVID 19 TEST ON_8/19/2022 _____'@_'$  '@_from'$  8am-3pm _____, THIS TEST MUST BE DONE BEFORE SURGERY,  Covid test is done at [place], [place] [address].  This is a drive thru.  No appt required. Please see map.                 Your procedure is scheduled on:            08/26/2020   Rep ort to [facility]  Entrance   Report to admitting at   [facility]    AM     Call this number if you have problems the morning of surgery [phone]    REMEMBER: NO  SOLID FOOD CANDY OR GUM AFTER MIDNIGHT. CLEAR LIQUIDS UNTIL    0430am      . NOTHING BY MOUTH EXCEPT CLEAR LIQUIDS UNTIL     0430am  . PLEASE FINISH ENSURE DRINK PER SURGEON ORDER  WHICH NEEDS TO BE COMPLETED AT  0430am    .      CLEAR LIQUID DIET   Foods Allowed                                                                    Coffee and tea, regular and decaf                            Fruit ices (not with fruit pulp)                                      Iced Popsicles                                    Carbonated beverages, regular and diet                                    Cranberry, grape and apple juices Sports drinks like Gatorade Lightly seasoned clear broth or consume(fat free) Sugar, honey syrup ___________________________________________________________________      BRUSH YOUR TEETH MORNING OF SURGERY AND RINSE YOUR MOUTH OUT, NO CHEWING GUM CANDY OR MINTS.     Take these medicines the morning of surgery with A SIP OF WATER:  eye drops as usual   DO NOT TAKE ANY DIABETIC MEDICATIONS DAY OF YOUR SURGERY                               You may not have any metal on your body including hair pins and              piercings  Do not wear jewelry,  make-up, lotions, powders or  perfumes, deodorant             Do not wear nail polish on your fingernails.  Do not shave  48 hours prior to surgery.              Men may shave face and neck.   Do not bring valuables to the hospital. [place].  Contacts, dentures or bridgework may not be worn into surgery.  Leave suitcase in the car. After surgery it may be brought to your room.     Patients discharged the day of surgery will not be allowed to drive home. IF YOU ARE HAVING SURGERY AND GOING HOME THE SAME DAY, YOU MUST HAVE AN ADULT TO DRIVE YOU HOME AND BE WITH YOU FOR 24 HOURS. YOU MAY GO HOME BY TAXI OR UBER OR ORTHERWISE, BUT AN ADULT MUST ACCOMPANY YOU HOME AND STAY WITH YOU FOR 24 HOURS.  Name and phone number of your driver:  Special Instructions: N/A              Please read over the following fact sheets you were given: _____________________________________________________________________  [facility] - Preparing for Surgery Before surgery, you can play an important role.  Because skin is not sterile, your skin needs to be as free of germs as possible.  You can reduce the number of germs on your skin by washing with CHG (chlorahexidine gluconate) soap before surgery.  CHG is an antiseptic cleaner which kills germs and bonds with the skin to continue killing germs even after washing. Please DO NOT use if you have an allergy to CHG or antibacterial soaps.  If your skin becomes reddened/irritated stop using the CHG and inform your nurse when you arrive at Short Stay. Do not shave (including legs and underarms) for at least 48 hours prior to the first CHG shower.  You may shave your face/neck. Please follow these instructions carefully:  1.  Shower with CHG Soap the night before surgery and the  morning of Surgery.  2.  If you choose to wash your hair, wash your hair first as usual with your  normal  shampoo.  3.  After you shampoo, rinse  your hair and body thoroughly to remove the  shampoo.                           4.  Use CHG as you would any other liquid soap.  You can apply chg directly  to the skin and wash                       Gently with a scrungie or clean washcloth.  5.  Apply the CHG Soap to your body ONLY FROM THE NECK DOWN.   Do not use on face/ open                           Wound or open sores. Avoid contact with eyes, ears mouth and genitals (private parts).                       Wash face,  Genitals (private parts) with your normal soap.             6.  Wash thoroughly, paying special attention to the area where  your surgery  will be performed.  7.  Thoroughly rinse your body with warm water from the neck down.  8.  DO NOT shower/wash with your normal soap after using and rinsing off  the CHG Soap.                9.  Pat yourself dry with a clean towel.            10.  Wear clean pajamas.            11.  Place clean sheets on your bed the night of your first shower and do not  sleep with pets. Day of Surgery : Do not apply any lotions/deodorants the morning of surgery.  Please wear clean clothes to the hospital/surgery center.  FAILURE TO FOLLOW THESE INSTRUCTIONS MAY RESULT IN THE CANCELLATION OF YOUR SURGERY PATIENT SIGNATURE_________________________________  NURSE SIGNATURE__________________________________  ________________________________________________________________________

## 2020-08-13 ENCOUNTER — Other Ambulatory Visit: Payer: Self-pay

## 2020-08-13 ENCOUNTER — Encounter (HOSPITAL_COMMUNITY)
Admission: RE | Admit: 2020-08-13 | Discharge: 2020-08-13 | Disposition: A | Discharge status: Home / Self Care | Payer: Medicare Other | Source: Ambulatory Visit | Attending: Orthopedic Surgery | Admitting: Orthopedic Surgery

## 2020-08-13 ENCOUNTER — Encounter (HOSPITAL_COMMUNITY): Payer: Self-pay

## 2020-08-13 DIAGNOSIS — R001 Bradycardia, unspecified: Secondary | ICD-10-CM | POA: Diagnosis not present

## 2020-08-13 DIAGNOSIS — Z79899 Other long term (current) drug therapy: Secondary | ICD-10-CM | POA: Diagnosis not present

## 2020-08-13 DIAGNOSIS — Z01818 Encounter for other preprocedural examination: Secondary | ICD-10-CM | POA: Diagnosis not present

## 2020-08-13 HISTORY — DX: Unspecified osteoarthritis, unspecified site: M19.90

## 2020-08-13 LAB — BASIC METABOLIC PANEL
Anion gap: 9 (ref 5–15)
BUN: 29 mg/dL — ABNORMAL HIGH (ref 8–23)
CO2: 26 mmol/L (ref 22–32)
Calcium: 9.5 mg/dL (ref 8.9–10.3)
Chloride: 104 mmol/L (ref 98–111)
Creatinine, Ser: 0.89 mg/dL (ref 0.44–1.00)
GFR, Estimated: >60 mL/min (ref >60)
Glucose, Bld: 101 mg/dL — ABNORMAL HIGH (ref 70–99)
Potassium: 4.8 mmol/L (ref 3.5–5.1)
Sodium: 139 mmol/L (ref 135–145)

## 2020-08-13 LAB — CBC
HCT: 39.6 % (ref 36.0–46.0)
Hemoglobin: 12.8 g/dL (ref 12.0–15.0)
MCH: 31.9 pg (ref 26.0–34.0)
MCHC: 32.3 g/dL (ref 30.0–36.0)
MCV: 98.8 fL (ref 80.0–100.0)
Platelets: 158 10*3/uL (ref 150–400)
RBC: 4.01 MIL/uL (ref 3.87–5.11)
RDW: 13.8 % (ref 11.5–15.5)
WBC: 5 10*3/uL (ref 4.0–10.5)
nRBC: 0 % (ref 0.0–0.2)

## 2020-08-13 LAB — HEMOGLOBIN A1C
Hgb A1c MFr Bld: 5.3 % (ref 4.8–5.6)
Mean Plasma Glucose: 105.41 mg/dL

## 2020-08-13 LAB — SURGICAL PCR SCREEN
MRSA, PCR: NEGATIVE
Staphylococcus aureus: NEGATIVE

## 2020-08-13 NOTE — Progress Notes (Signed)
Anesthesia Review:  PCP: DR Hilma Favors- have requested clearance and LOV note from DR [facility] office.[id number]   Cardiologist : none  Chest x-ray : EKG :09/13/20 Echo : Stress test: Cardiac Cath :  Activity level: can do a flight of stairs slowly per pt  Sleep Study/ CPAP : none  Fasting Blood Sugar :      / Checks Blood Sugar -- times a day:   Blood Thinner/ Instructions /Last Dose: ASA / Instructions/ Last Dose :   81 mg Aspirin Hgba1c-8/10-22- 5.3

## 2020-08-18 NOTE — Care Plan (Signed)
Ortho Bundle Case Management Note  Patient Details  Name: Sarah Walker MRN: [id number] Date of Birth: Oct 28, 1950    Met with patient in the office for H&P visit. She will discharge to home with family to assist. Rolling walker ordered. OPPT set up with [facility]. Patient and MD in agreement with plan. Choice offered                  DME Arranged:  Walker rolling DME Agency:  [employer]  HH Arranged:    [place] Agency:     Additional Comments: Please contact me with any questions of if this plan should need to change.  Ladell Heads,  [place] [facility]ist  [phone] 08/18/2020, 11:22 AM

## 2020-08-19 ENCOUNTER — Other Ambulatory Visit: Payer: Self-pay | Admitting: Orthopedic Surgery

## 2020-08-20 LAB — SARS CORONAVIRUS 2 (TAT 6-24 HRS): SARS Coronavirus 2: NEGATIVE

## 2020-08-22 NOTE — H&P (Signed)
HIP ARTHROPLASTY ADMISSION H&P  Patient ID: Sarah Walker MRN: [id number] DOB/AGE: 09/12/1950 70 y.o.  Chief Complaint: right hip pain.  Planned Procedure Date: 08/26/20 Medical Clearance by Dr. Hilma Favors   Cardiac Clearance by Dr. Hilma Favors   HPI: Sarah Walker is a 70 y.o. female who presents for evaluation of OA RIGHT HIP. The patient has a history of pain and functional disability in the right hip due to arthritis and has failed non-surgical conservative treatments for greater than 12 weeks to include NSAID's and/or analgesics, corticosteriod injections, use of assistive devices, and activity modification.  Onset of symptoms was gradual, starting >10 years ago with gradually worsening course since that time. The patient noted no past surgery on the right hip.  Patient currently rates pain at 10 out of 10 with activity. Patient has night pain, worsening of pain with activity and weight bearing, and pain that interferes with activities of daily living.  Patient has evidence of subchondral sclerosis, periarticular osteophytes, and joint space narrowing by imaging studies.  There is no active infection.  Past Medical History:  Diagnosis Date   Abdominal hernia    Allergic rhinitis    Arthritis    Bone spur of other site    left foot   Dry eyes    Family history of kidney cancer    Family history of prostate cancer    Hypertension    Ovarian cancer ([place])    Ovarian cancer on right ([place]) 06/17/2011   Peripheral neuropathy 08/26/2011   Chemotherapy-induced   Pulmonary embolism ([place])    Sinusitis    Past Surgical History:  Procedure Laterality Date   ABDOMINAL HYSTERECTOMY  05/25/2011   [place] bs&o and cancer staging   CHOLECYSTECTOMY  1990   COLONOSCOPY N/A 09/06/2014   Procedure: COLONOSCOPY;  Surgeon: Rogene Houston, MD;  Location: [facility] ENDO SU[facility];  Service: Endoscopy;  Laterality: N/A;  930 - moved to 9/2 @ 8:25   [place]  10/15/2014   INCISIONAL HERNIA REPAIR   10/15/2014   Procedure: OPEN INCISIONAL HERNIA REPAIR WITH MESH AND MYOFASCIAL FLAPS;  Surgeon: Erroll Luna, MD;  Location: [facility];  Service: General;;   IVC filter  05/24/11   PORT-A-CATH REMOVAL Right 11/21/2013   Procedure: MINOR REMOVAL PORT-A-CATH;  Surgeon: Jamesetta So, MD;  Location: [facility] ORS;  Service: General;  Laterality: Right;   PORTACATH PLACEMENT  06/16/11   Done at [place]     age 70's   TUBAL LIGATION  1981   Allergies  Allergen Reactions   Codeine Nausea And Vomiting    Not right away after a couple days of taking   Prior to Admission medications   Medication Sig Start Date End Date Taking? Authorizing Provider  acetaminophen (TYLENOL) 500 MG tablet Take 1,000 mg by mouth daily.   Yes [provider]  Ascorbic Acid (VITAMIN C PO) Take 4 tablets by mouth daily. Chewable   Yes [provider]  aspirin EC 81 MG tablet Take 81 mg by mouth daily.   Yes [provider]  azelastine (OPTIVAR) 0.05 % ophthalmic solution Place 1 drop into both eyes daily as needed (rag weed). 07/02/14  Yes [provider]  bisoprolol-hydrochlorothiazide (ZIAC) 5-6.25 MG per tablet Take 1 tablet by mouth daily.   Yes [provider]  Calcium-Magnesium-Vitamin D (CALCIUM 500 PO) Take 1,000 mg by mouth daily. Chewable   Yes [provider]  Cholecalciferol (VITAMIN D3 PO) Take 1 capsule by  mouth daily.   Yes [provider]  Cyanocobalamin (VITAMIN B-12) 2500 MCG SUBL Place 2,500 mcg under the tongue daily.   Yes [provider]  DHA-EPA-Vitamin E (OMEGA-3 COMPLEX PO) Take 4 capsules by mouth daily. Omega xl   Yes [provider]  diphenhydrAMINE (BENADRYL) 25 MG tablet Take 25 mg by mouth every 6 (six) hours as needed for itching.   Yes [provider]  fexofenadine (ALLEGRA) 180 MG tablet Take 180 mg by mouth daily as needed for allergies or rhinitis.   Yes [provider]   fluticasone (FLONASE) 50 MCG/ACT nasal spray Place 1 spray into both nostrils daily as needed for allergies or rhinitis.   Yes [provider]  ibuprofen (ADVIL) 200 MG tablet Take 400 mg by mouth daily.   Yes [provider]  KLOR-CON M20 20 MEQ tablet TAKE 1 TABLET BY MOUTH EVERY DAY Patient taking differently: Take 10 mEq by mouth daily. 11/30/16  Yes Holley Bouche, NP  Menaquinone-7 (VITAMIN K2 PO) Take 1 capsule by mouth daily. MK7   Yes [provider]  valsartan (DIOVAN) 160 MG tablet Take 160 mg by mouth daily. 10/14/17  Yes [provider]  zinc gluconate 50 MG tablet Take 50 mg by mouth daily.   Yes [provider]  Zinc Oxide 40 % PSTE Apply 1 application topically daily as needed. Patient taking differently: Apply 1 g topically daily as needed (Vaginal soreness). 11/23/18  Yes Roger Shelter, FNP   Social History   Socioeconomic History   Marital status: Married    Spouse name: Not on file   Number of children: Not on file   Years of education: Not on file   Highest education level: Not on file  Occupational History   Not on file  Tobacco Use   Smoking status: Never   Smokeless tobacco: Never  Vaping Use   Vaping Use: Never used  Substance and Sexual Activity   Alcohol use: No   Drug use: No   Sexual activity: Yes    Birth control/protection: Surgical    Comment: hyst  Other Topics Concern   Not on file  Social History Narrative   Not on file   Social Determinants of Health   Financial Resource Strain: Not on file  Food Insecurity: Not on file  Transportation Needs: Not on file  Physical Activity: Not on file  Stress: Not on file  Social Connections: Not on file   Family History  Problem Relation Age of Onset   Stroke Mother    Cervical cancer Mother 21   Kidney cancer Father 44   Aneurysm Paternal Grandmother        62s   Hodgkin's lymphoma Maternal Uncle 33   Prostate cancer Paternal Uncle    Cancer  Cousin        2 female maternal first cousins with unknown cancer   Throat cancer Paternal Uncle     ROS: Currently denies lightheadedness, dizziness, Fever, chills, CP, SOB.   No personal history of DVT, MI, or CVA. + h/o PE in 2013. No loose teeth or dentures All other systems have been reviewed and were otherwise currently negative with the exception of those mentioned in the HPI and as above.  Objective: Vitals: Ht: 5' Wt: 202 lbs Temp: 97.6 BP: 131/79 Pulse: 55 O2 100% on room air.   Physical Exam: General: Alert, NAD. Trendelenberg Gait  HEENT: EOMI, Good Neck Extension  Pulm: No increased work of  breathing.  Clear B/L A/P w/o crackle or wheeze.  CV: RRR, No m/g/r appreciated  GI: soft, NT, ND. BS x 4 quadrants Neuro: CN II-XII grossly intact without focal deficit.  Sensation intact distally Skin: No lesions in the area of chief complaint MSK/Surgical Site: +TTP lateral aspect of R hip. Hip pain with ROM. Limited ROM. + Stinchfield. + SLR. + FABER/FADIR. Decreased strength.  NVI.    Imaging Review Plain radiographs demonstrate severe degenerative joint disease of the right hip.   The bone quality appears to be fair for age and reported activity level.  Preoperative templating of the joint replacement has been completed, documented, and submitted to the Operating Room personnel in order to optimize intra-operative equipment management.  Assessment: OA RIGHT HIP Active Problems:   * No active hospital problems. *   Plan: Plan for Procedure(s): TOTAL HIP ARTHROPLASTY ANTERIOR APPROACH  The patient history, physical exam, clinical judgement of the provider and imaging are consistent with end stage degenerative joint disease and total joint arthroplasty is deemed medically necessary. The treatment options including medical management, injection therapy, and arthroplasty were discussed at length. The risks and benefits of Procedure(s): TOTAL HIP ARTHROPLASTY ANTERIOR APPROACH  were presented and reviewed.  The risks of nonoperative treatment, versus surgical intervention including but not limited to continued pain, aseptic loosening, stiffness, dislocation/subluxation, infection, bleeding, nerve injury, blood clots, cardiopulmonary complications, morbidity, mortality, among others were discussed. The patient verbalizes understanding and wishes to proceed with the plan.  Patient is being admitted for surgery, pain control, PT, prophylactic antibiotics, VTE prophylaxis, progressive ambulation, ADL's and discharge planning.   Dental prophylaxis discussed and recommended for 2 years postoperatively.  The patient does meet the criteria for TXA which will be used perioperatively.   Xarelto '10mg'$  daily will be used postoperatively for DVT prophylaxis in addition to SCDs, and early ambulation. Plan for Norco 10, Celebrex, Tylenol for pain.   Robaxin for muscle spasms.   Zofran for nausea and vomiting.  Omeprazole for gastric protection. Pharmacy- [place]; would like meds sent 1 day prior to surgery The patient is planning to be discharged home with OPPT and into the care of her husband Sarah Walker who can be reached at [phone] Follow up appt 09/10/20 at [place], [place] Office [id number] 08/22/2020 1:10 PM

## 2020-08-25 ENCOUNTER — Encounter (HOSPITAL_COMMUNITY): Payer: Self-pay | Admitting: Orthopedic Surgery

## 2020-08-25 MED ORDER — BUPIVACAINE LIPOSOME 1.3 % IJ SUSP
10.0000 mL | Freq: Once | INTRAMUSCULAR | Status: DC
  Filled 2020-08-25: qty 10

## 2020-08-26 ENCOUNTER — Encounter (HOSPITAL_COMMUNITY): Payer: Self-pay | Admitting: Orthopedic Surgery

## 2020-08-26 ENCOUNTER — Observation Stay (HOSPITAL_COMMUNITY): Payer: Medicare Other

## 2020-08-26 ENCOUNTER — Ambulatory Visit (HOSPITAL_COMMUNITY): Payer: Medicare Other | Admitting: Anesthesiology

## 2020-08-26 ENCOUNTER — Ambulatory Visit (HOSPITAL_COMMUNITY): Payer: Medicare Other

## 2020-08-26 ENCOUNTER — Ambulatory Visit (HOSPITAL_COMMUNITY): Payer: Medicare Other | Admitting: Physician Assistant

## 2020-08-26 ENCOUNTER — Other Ambulatory Visit: Payer: Self-pay

## 2020-08-26 ENCOUNTER — Observation Stay (HOSPITAL_COMMUNITY)
Admission: RE | Admit: 2020-08-26 | Discharge: 2020-08-27 | Disposition: A | Discharge status: Home / Self Care | Payer: Medicare Other | Attending: Orthopedic Surgery | Admitting: Orthopedic Surgery

## 2020-08-26 ENCOUNTER — Encounter (HOSPITAL_COMMUNITY)
Admission: RE | Disposition: A | Discharge status: Home / Self Care | Payer: Self-pay | Source: Home / Self Care | Attending: Orthopedic Surgery

## 2020-08-26 DIAGNOSIS — J309 Allergic rhinitis, unspecified: Secondary | ICD-10-CM | POA: Diagnosis not present

## 2020-08-26 DIAGNOSIS — Z96641 Presence of right artificial hip joint: Secondary | ICD-10-CM

## 2020-08-26 DIAGNOSIS — Z7982 Long term (current) use of aspirin: Secondary | ICD-10-CM | POA: Insufficient documentation

## 2020-08-26 DIAGNOSIS — I10 Essential (primary) hypertension: Secondary | ICD-10-CM | POA: Insufficient documentation

## 2020-08-26 DIAGNOSIS — Z79899 Other long term (current) drug therapy: Secondary | ICD-10-CM | POA: Insufficient documentation

## 2020-08-26 DIAGNOSIS — Z471 Aftercare following joint replacement surgery: Secondary | ICD-10-CM | POA: Diagnosis not present

## 2020-08-26 DIAGNOSIS — I2699 Other pulmonary embolism without acute cor pulmonale: Secondary | ICD-10-CM | POA: Diagnosis not present

## 2020-08-26 DIAGNOSIS — Z8543 Personal history of malignant neoplasm of ovary: Secondary | ICD-10-CM | POA: Insufficient documentation

## 2020-08-26 DIAGNOSIS — Z9889 Other specified postprocedural states: Secondary | ICD-10-CM | POA: Diagnosis not present

## 2020-08-26 DIAGNOSIS — R600 Localized edema: Secondary | ICD-10-CM | POA: Diagnosis not present

## 2020-08-26 DIAGNOSIS — M1611 Unilateral primary osteoarthritis, right hip: Principal | ICD-10-CM | POA: Insufficient documentation

## 2020-08-26 DIAGNOSIS — Z96649 Presence of unspecified artificial hip joint: Secondary | ICD-10-CM

## 2020-08-26 HISTORY — PX: TOTAL HIP ARTHROPLASTY: SHX124

## 2020-08-26 SURGERY — ARTHROPLASTY, HIP, TOTAL, ANTERIOR APPROACH
Anesthesia: Monitor Anesthesia Care | Site: Hip | Laterality: Right

## 2020-08-26 MED ORDER — POVIDONE-IODINE 10 % EX SWAB
2.0000 "application " | Freq: Once | CUTANEOUS | Status: AC
  Administered 2020-08-26: 2 via TOPICAL

## 2020-08-26 MED ORDER — POVIDONE-IODINE 10 % EX SWAB: 2.0000 "application " | Freq: Once | CUTANEOUS | Status: DC

## 2020-08-26 MED ORDER — ROCURONIUM BROMIDE 10 MG/ML (PF) SYRINGE
PREFILLED_SYRINGE | INTRAVENOUS | Status: AC
  Filled 2020-08-26: qty 10

## 2020-08-26 MED ORDER — PHENYLEPHRINE HCL-NACL 20-0.9 MG/250ML-% IV SOLN
INTRAVENOUS | Status: DC | PRN
  Administered 2020-08-26: 25 ug/min via INTRAVENOUS

## 2020-08-26 MED ORDER — IRBESARTAN 150 MG PO TABS
150.0000 mg | ORAL_TABLET | Freq: Every day | ORAL | Status: DC
  Administered 2020-08-26: 150 mg via ORAL
  Filled 2020-08-26 (×2): qty 1

## 2020-08-26 MED ORDER — CEFAZOLIN SODIUM-DEXTROSE 2-4 GM/100ML-% IV SOLN
2.0000 g | INTRAVENOUS | Status: AC
  Administered 2020-08-26: 2 g via INTRAVENOUS
  Filled 2020-08-26: qty 100

## 2020-08-26 MED ORDER — MENTHOL 3 MG MT LOZG
1.0000 | LOZENGE | OROMUCOSAL | Status: DC | PRN
  Administered 2020-08-27: 3 mg via ORAL
  Filled 2020-08-26: qty 9

## 2020-08-26 MED ORDER — METOCLOPRAMIDE HCL 5 MG PO TABS
5.0000 mg | ORAL_TABLET | Freq: Three times a day (TID) | ORAL | Status: DC | PRN
Start: 2020-08-26 — End: 2020-08-27

## 2020-08-26 MED ORDER — ACETAMINOPHEN 10 MG/ML IV SOLN: 1000.0000 mg | Freq: Once | INTRAVENOUS | Status: DC | PRN

## 2020-08-26 MED ORDER — LACTATED RINGERS IV SOLN: INTRAVENOUS | Status: DC

## 2020-08-26 MED ORDER — METOCLOPRAMIDE HCL 5 MG/ML IJ SOLN: 5.0000 mg | Freq: Three times a day (TID) | INTRAMUSCULAR | Status: DC | PRN

## 2020-08-26 MED ORDER — CHLORHEXIDINE GLUCONATE 0.12 % MT SOLN
15.0000 mL | Freq: Once | OROMUCOSAL | Status: AC
  Administered 2020-08-26: 15 mL via OROMUCOSAL

## 2020-08-26 MED ORDER — BISOPROLOL-HYDROCHLOROTHIAZIDE 5-6.25 MG PO TABS
1.0000 | ORAL_TABLET | Freq: Every day | ORAL | Status: DC
  Administered 2020-08-26: 1 via ORAL
  Filled 2020-08-26 (×2): qty 1

## 2020-08-26 MED ORDER — SODIUM CHLORIDE (PF) 0.9 % IJ SOLN
INTRAMUSCULAR | Status: AC
  Filled 2020-08-26: qty 20

## 2020-08-26 MED ORDER — OXYCODONE HCL 5 MG PO TABS
10.0000 mg | ORAL_TABLET | ORAL | Status: DC | PRN
  Administered 2020-08-26: 10 mg via ORAL
  Filled 2020-08-26: qty 2

## 2020-08-26 MED ORDER — ACETAMINOPHEN 500 MG PO TABS: 1000.0000 mg | ORAL_TABLET | Freq: Once | ORAL | Status: DC | PRN

## 2020-08-26 MED ORDER — METHOCARBAMOL 500 MG PO TABS
500.0000 mg | ORAL_TABLET | Freq: Four times a day (QID) | ORAL | Status: DC | PRN
  Administered 2020-08-27: 500 mg via ORAL
  Filled 2020-08-26: qty 1

## 2020-08-26 MED ORDER — RIVAROXABAN 10 MG PO TABS
10.0000 mg | ORAL_TABLET | Freq: Every day | ORAL | Status: DC
  Administered 2020-08-27: 10 mg via ORAL
  Filled 2020-08-26: qty 1

## 2020-08-26 MED ORDER — ACETAMINOPHEN 500 MG PO TABS
1000.0000 mg | ORAL_TABLET | Freq: Four times a day (QID) | ORAL | Status: AC
  Administered 2020-08-26 – 2020-08-27 (×4): 1000 mg via ORAL
  Filled 2020-08-26 (×5): qty 2

## 2020-08-26 MED ORDER — PROPOFOL 500 MG/50ML IV EMUL
INTRAVENOUS | Status: DC | PRN
  Administered 2020-08-26: 75 ug/kg/min via INTRAVENOUS

## 2020-08-26 MED ORDER — METHOCARBAMOL 500 MG IVPB - SIMPLE MED
500.0000 mg | Freq: Four times a day (QID) | INTRAVENOUS | Status: DC | PRN
  Filled 2020-08-26: qty 50

## 2020-08-26 MED ORDER — ALUM & MAG HYDROXIDE-SIMETH 200-200-20 MG/5ML PO SUSP: 30.0000 mL | ORAL | Status: DC | PRN

## 2020-08-26 MED ORDER — GLYCOPYRROLATE 0.2 MG/ML IJ SOLN
INTRAMUSCULAR | Status: AC
  Filled 2020-08-26: qty 1

## 2020-08-26 MED ORDER — ONDANSETRON HCL 4 MG PO TABS: 4.0000 mg | ORAL_TABLET | Freq: Four times a day (QID) | ORAL | Status: DC | PRN

## 2020-08-26 MED ORDER — DOCUSATE SODIUM 100 MG PO CAPS
100.0000 mg | ORAL_CAPSULE | Freq: Two times a day (BID) | ORAL | Status: DC
  Administered 2020-08-26 – 2020-08-27 (×3): 100 mg via ORAL
  Filled 2020-08-26 (×3): qty 1

## 2020-08-26 MED ORDER — LIDOCAINE 2% (20 MG/ML) 5 ML SYRINGE
INTRAMUSCULAR | Status: AC
  Filled 2020-08-26: qty 5

## 2020-08-26 MED ORDER — ORAL CARE MOUTH RINSE: 15.0000 mL | Freq: Once | OROMUCOSAL | Status: AC

## 2020-08-26 MED ORDER — DEXAMETHASONE SODIUM PHOSPHATE 10 MG/ML IJ SOLN: 8.0000 mg | Freq: Once | INTRAMUSCULAR | Status: DC

## 2020-08-26 MED ORDER — OXYCODONE HCL 5 MG/5ML PO SOLN: 5.0000 mg | Freq: Once | ORAL | Status: DC | PRN

## 2020-08-26 MED ORDER — DEXAMETHASONE SODIUM PHOSPHATE 10 MG/ML IJ SOLN
INTRAMUSCULAR | Status: AC
  Filled 2020-08-26: qty 1

## 2020-08-26 MED ORDER — ONDANSETRON HCL 4 MG/2ML IJ SOLN: 4.0000 mg | Freq: Four times a day (QID) | INTRAMUSCULAR | Status: DC | PRN

## 2020-08-26 MED ORDER — PROPOFOL 10 MG/ML IV BOLUS
INTRAVENOUS | Status: AC
  Filled 2020-08-26: qty 40

## 2020-08-26 MED ORDER — TRANEXAMIC ACID-NACL 1000-0.7 MG/100ML-% IV SOLN
1000.0000 mg | Freq: Once | INTRAVENOUS | Status: AC
  Administered 2020-08-26: 1000 mg via INTRAVENOUS
  Filled 2020-08-26: qty 100

## 2020-08-26 MED ORDER — BUPIVACAINE LIPOSOME 1.3 % IJ SUSP
INTRAMUSCULAR | Status: DC | PRN
  Administered 2020-08-26: 10 mL

## 2020-08-26 MED ORDER — EPHEDRINE 5 MG/ML INJ
INTRAVENOUS | Status: AC
  Filled 2020-08-26: qty 5

## 2020-08-26 MED ORDER — SODIUM CHLORIDE FLUSH 0.9 % IV SOLN
INTRAVENOUS | Status: DC | PRN
  Administered 2020-08-26: 20 mL

## 2020-08-26 MED ORDER — POTASSIUM CHLORIDE CRYS ER 10 MEQ PO TBCR
10.0000 meq | EXTENDED_RELEASE_TABLET | Freq: Every day | ORAL | Status: DC
  Administered 2020-08-27: 10 meq via ORAL
  Filled 2020-08-26: qty 1

## 2020-08-26 MED ORDER — VITAMIN B-12 2500 MCG SL SUBL: 2500.0000 ug | SUBLINGUAL_TABLET | Freq: Every day | SUBLINGUAL | Status: DC

## 2020-08-26 MED ORDER — FENTANYL CITRATE (PF) 100 MCG/2ML IJ SOLN
INTRAMUSCULAR | Status: AC
  Filled 2020-08-26: qty 2

## 2020-08-26 MED ORDER — ONDANSETRON HCL 4 MG/2ML IJ SOLN
INTRAMUSCULAR | Status: DC | PRN
  Administered 2020-08-26: 4 mg via INTRAVENOUS

## 2020-08-26 MED ORDER — FENTANYL CITRATE (PF) 100 MCG/2ML IJ SOLN
INTRAMUSCULAR | Status: DC | PRN
  Administered 2020-08-26: 50 ug via INTRAVENOUS

## 2020-08-26 MED ORDER — OXYCODONE HCL 5 MG PO TABS: 5.0000 mg | ORAL_TABLET | Freq: Once | ORAL | Status: DC | PRN

## 2020-08-26 MED ORDER — HYDROMORPHONE HCL 1 MG/ML IJ SOLN: 0.5000 mg | INTRAMUSCULAR | Status: DC | PRN

## 2020-08-26 MED ORDER — KETOROLAC TROMETHAMINE 30 MG/ML IJ SOLN
INTRAMUSCULAR | Status: AC
  Filled 2020-08-26: qty 1

## 2020-08-26 MED ORDER — KETOROLAC TROMETHAMINE 30 MG/ML IJ SOLN
30.0000 mg | Freq: Once | INTRAMUSCULAR | Status: AC
  Administered 2020-08-26: 30 mg via INTRAVENOUS

## 2020-08-26 MED ORDER — TRANEXAMIC ACID-NACL 1000-0.7 MG/100ML-% IV SOLN
1000.0000 mg | INTRAVENOUS | Status: AC
  Administered 2020-08-26: 1000 mg via INTRAVENOUS
  Filled 2020-08-26: qty 100

## 2020-08-26 MED ORDER — BISACODYL 10 MG RE SUPP: 10.0000 mg | Freq: Every day | RECTAL | Status: DC | PRN

## 2020-08-26 MED ORDER — ACETAMINOPHEN 500 MG PO TABS
1000.0000 mg | ORAL_TABLET | Freq: Once | ORAL | Status: AC
  Administered 2020-08-26: 1000 mg via ORAL
  Filled 2020-08-26: qty 2

## 2020-08-26 MED ORDER — DIPHENHYDRAMINE HCL 12.5 MG/5ML PO ELIX: 12.5000 mg | ORAL_SOLUTION | ORAL | Status: DC | PRN

## 2020-08-26 MED ORDER — PHENOL 1.4 % MT LIQD: 1.0000 | OROMUCOSAL | Status: DC | PRN

## 2020-08-26 MED ORDER — PANTOPRAZOLE SODIUM 40 MG PO TBEC
40.0000 mg | DELAYED_RELEASE_TABLET | Freq: Every day | ORAL | Status: DC
  Administered 2020-08-26 – 2020-08-27 (×2): 40 mg via ORAL
  Filled 2020-08-26 (×2): qty 1

## 2020-08-26 MED ORDER — MIDAZOLAM HCL 2 MG/2ML IJ SOLN
INTRAMUSCULAR | Status: AC
  Filled 2020-08-26: qty 2

## 2020-08-26 MED ORDER — FENTANYL CITRATE (PF) 100 MCG/2ML IJ SOLN
25.0000 ug | INTRAMUSCULAR | Status: DC | PRN
  Administered 2020-08-26 (×2): 25 ug via INTRAVENOUS
  Administered 2020-08-26: 50 ug via INTRAVENOUS

## 2020-08-26 MED ORDER — 0.9 % SODIUM CHLORIDE (POUR BTL) OPTIME
TOPICAL | Status: DC | PRN
  Administered 2020-08-26: 1000 mL

## 2020-08-26 MED ORDER — ACETAMINOPHEN 325 MG PO TABS: 325.0000 mg | ORAL_TABLET | Freq: Four times a day (QID) | ORAL | Status: DC | PRN

## 2020-08-26 MED ORDER — VITAMIN B-12 1000 MCG PO TABS
1000.0000 ug | ORAL_TABLET | Freq: Every day | ORAL | Status: DC
  Administered 2020-08-26 – 2020-08-27 (×2): 1000 ug via ORAL
  Filled 2020-08-26 (×2): qty 1

## 2020-08-26 MED ORDER — SODIUM CHLORIDE (PF) 0.9 % IJ SOLN
INTRAMUSCULAR | Status: DC | PRN
  Administered 2020-08-26: 20 mL

## 2020-08-26 MED ORDER — WATER FOR IRRIGATION, STERILE IR SOLN
Status: DC | PRN
  Administered 2020-08-26 (×2): 1000 mL

## 2020-08-26 MED ORDER — MIDAZOLAM HCL 5 MG/5ML IJ SOLN
INTRAMUSCULAR | Status: DC | PRN
  Administered 2020-08-26 (×2): 1 mg via INTRAVENOUS

## 2020-08-26 MED ORDER — PROPOFOL 1000 MG/100ML IV EMUL
INTRAVENOUS | Status: AC
  Filled 2020-08-26: qty 100

## 2020-08-26 MED ORDER — CEFAZOLIN SODIUM-DEXTROSE 2-4 GM/100ML-% IV SOLN
2.0000 g | Freq: Four times a day (QID) | INTRAVENOUS | Status: AC
Start: 2020-08-26 — End: 2020-08-26
  Administered 2020-08-26 (×2): 2 g via INTRAVENOUS
  Filled 2020-08-26 (×2): qty 100

## 2020-08-26 MED ORDER — DEXAMETHASONE SODIUM PHOSPHATE 10 MG/ML IJ SOLN
10.0000 mg | Freq: Once | INTRAMUSCULAR | Status: AC
  Administered 2020-08-27: 10 mg via INTRAVENOUS
  Filled 2020-08-26: qty 1

## 2020-08-26 MED ORDER — ACETAMINOPHEN 160 MG/5ML PO SOLN: 1000.0000 mg | Freq: Once | ORAL | Status: DC | PRN

## 2020-08-26 MED ORDER — DEXAMETHASONE SODIUM PHOSPHATE 10 MG/ML IJ SOLN
INTRAMUSCULAR | Status: DC | PRN
  Administered 2020-08-26: 4 mg via INTRAVENOUS

## 2020-08-26 MED ORDER — ONDANSETRON HCL 4 MG/2ML IJ SOLN
INTRAMUSCULAR | Status: AC
  Filled 2020-08-26: qty 2

## 2020-08-26 MED ORDER — OXYCODONE HCL 5 MG PO TABS
5.0000 mg | ORAL_TABLET | ORAL | Status: DC | PRN
  Administered 2020-08-26: 5 mg via ORAL
  Filled 2020-08-26: qty 2

## 2020-08-26 MED ORDER — EPHEDRINE SULFATE-NACL 50-0.9 MG/10ML-% IV SOSY
PREFILLED_SYRINGE | INTRAVENOUS | Status: DC | PRN
  Administered 2020-08-26: 10 mg via INTRAVENOUS
  Administered 2020-08-26: 5 mg via INTRAVENOUS
  Administered 2020-08-26: 10 mg via INTRAVENOUS

## 2020-08-26 MED ORDER — TRAMADOL HCL 50 MG PO TABS
50.0000 mg | ORAL_TABLET | Freq: Four times a day (QID) | ORAL | Status: DC
  Administered 2020-08-26 – 2020-08-27 (×5): 50 mg via ORAL
  Filled 2020-08-26 (×5): qty 1

## 2020-08-26 MED ORDER — BUPIVACAINE IN DEXTROSE 0.75-8.25 % IT SOLN
INTRATHECAL | Status: DC | PRN
  Administered 2020-08-26: 1.8 mL via INTRATHECAL

## 2020-08-26 MED ORDER — POLYETHYLENE GLYCOL 3350 17 G PO PACK: 17.0000 g | PACK | Freq: Every day | ORAL | Status: DC | PRN

## 2020-08-26 MED ORDER — PHENYLEPHRINE HCL-NACL 20-0.9 MG/250ML-% IV SOLN
INTRAVENOUS | Status: AC
  Filled 2020-08-26: qty 500

## 2020-08-26 SURGICAL SUPPLY — 45 items
APL PRP STRL LF DISP 70% ISPRP (MISCELLANEOUS) ×1
BAG COUNTER SPONGE SURGICOUNT (BAG) IMPLANT
BAG SPEC THK2 15X12 ZIP CLS (MISCELLANEOUS)
BAG SPNG CNTER NS LX DISP (BAG)
BAG ZIPLOCK 12X15 (MISCELLANEOUS) IMPLANT
BLADE SAG 18X100X1.27 (BLADE) ×2 IMPLANT
BLADE SURG SZ10 CARB STEEL (BLADE) IMPLANT
CHLORAPREP W/TINT 26 (MISCELLANEOUS) ×2 IMPLANT
CLSR STERI-STRIP ANTIMIC 1/2X4 (GAUZE/BANDAGES/DRESSINGS) ×2 IMPLANT
COVER PERINEAL POST (MISCELLANEOUS) ×2 IMPLANT
COVER SURGICAL LIGHT HANDLE (MISCELLANEOUS) ×2 IMPLANT
DECANTER SPIKE VIAL GLASS SM (MISCELLANEOUS) ×4 IMPLANT
DRAPE IMP U-DRAPE 54X76 (DRAPES) ×2 IMPLANT
DRAPE STERI IOBAN 125X83 (DRAPES) ×2 IMPLANT
DRAPE U-SHAPE 47X51 STRL (DRAPES) ×4 IMPLANT
DRSG MEPILEX BORDER 4X8 (GAUZE/BANDAGES/DRESSINGS) ×2 IMPLANT
ELECT REM PT RETURN 15FT ADLT (MISCELLANEOUS) ×2 IMPLANT
GLOVE SRG 8 PF TXTR STRL LF DI (GLOVE) ×1 IMPLANT
GLOVE SURG ENC MOIS LTX SZ7.5 (GLOVE) ×2 IMPLANT
GLOVE SURG POLYISO LF SZ7.5 (GLOVE) ×2 IMPLANT
GLOVE SURG UNDER POLY LF SZ7.5 (GLOVE) ×2 IMPLANT
GLOVE SURG UNDER POLY LF SZ8 (GLOVE) ×2
GOWN STRL REUS W/TWL LRG LVL3 (GOWN DISPOSABLE) ×2 IMPLANT
GOWN STRL REUS W/TWL XL LVL3 (GOWN DISPOSABLE) ×2 IMPLANT
HEAD CERAMIC FEMORAL 36MM (Head) ×1 IMPLANT
HOLDER FOLEY CATH W/STRAP (MISCELLANEOUS) IMPLANT
INSERT 0 DEGREE 36 (Miscellaneous) ×1 IMPLANT
KIT TURNOVER KIT A (KITS) ×2 IMPLANT
MANIFOLD NEPTUNE II (INSTRUMENTS) ×2 IMPLANT
NS IRRIG 1000ML POUR BTL (IV SOLUTION) ×2 IMPLANT
PACK ANTERIOR HIP CUSTOM (KITS) ×2 IMPLANT
PROTECTOR NERVE ULNAR (MISCELLANEOUS) ×2 IMPLANT
SCREW HEX LP 6.5X20 (Screw) ×1 IMPLANT
SHELL ACETAB TRIDENT 48 (Shell) ×1 IMPLANT
STEM FEM ACCOLADE 38X102X30 S3 (Stem) ×1 IMPLANT
SUT MNCRL AB 3-0 PS2 18 (SUTURE) ×2 IMPLANT
SUT STRATAFIX 0 PDS 27 VIOLET (SUTURE) ×2
SUT VIC AB 0 CT1 36 (SUTURE) ×2 IMPLANT
SUT VIC AB 1 CT1 36 (SUTURE) ×2 IMPLANT
SUT VIC AB 2-0 CT1 27 (SUTURE) ×4
SUT VIC AB 2-0 CT1 TAPERPNT 27 (SUTURE) ×2 IMPLANT
SUTURE STRATFX 0 PDS 27 VIOLET (SUTURE) ×1 IMPLANT
TRAY FOLEY MTR SLVR 16FR STAT (SET/KITS/TRAYS/PACK) IMPLANT
TUBE SUCTION HIGH CAP CLEAR NV (SUCTIONS) ×2 IMPLANT
WATER STERILE IRR 1000ML POUR (IV SOLUTION) ×4 IMPLANT

## 2020-08-26 NOTE — Op Note (Signed)
08/26/2020  9:16 AM  PATIENT:  Sarah Walker   MRN: [id number]  PRE-OPERATIVE DIAGNOSIS:  OA RIGHT HIP  POST-OPERATIVE DIAGNOSIS:  OA RIGHT HIP  PROCEDURE:  Procedure(s): TOTAL HIP ARTHROPLASTY ANTERIOR APPROACH  PREOPERATIVE INDICATIONS:    Sarah Walker is an 70 y.o. female who has a diagnosis of <principal problem not specified> and elected for surgical management after failing conservative treatment.  The risks benefits and alternatives were discussed with the patient including but not limited to the risks of nonoperative treatment, versus surgical intervention including infection, bleeding, nerve injury, periprosthetic fracture, the need for revision surgery, dislocation, leg length discrepancy, blood clots, cardiopulmonary complications, morbidity, mortality, among others, and they were willing to proceed.     OPERATIVE REPORT     SURGEON:   Renette Butters, MD    ASSISTANT:  Aggie Moats, PA-C, he was present and scrubbed throughout the case, critical for completion in a timely fashion, and for retraction, instrumentation, and closure.     ANESTHESIA:  General    COMPLICATIONS:  None.     COMPONENTS:  Stryker acolade fit femur size 3 with a 36 mm 0 head ball and an acetabular shell size 48 with a  polyethylene liner    PROCEDURE IN DETAIL:   The patient was met in the holding area and  identified.  The appropriate hip was identified and marked at the operative site.  The patient was then transported to the OR  and  placed under anesthesia per that record.  At that point, the patient was  placed in the supine position and  secured to the operating room table and all bony prominences padded. He received pre-operative antibiotics    The operative lower extremity was prepped from the iliac crest to the distal leg.  Sterile draping was performed.  Time out was performed prior to incision.      Skin incision was made just 2 cm lateral to the ASIS  extending in line with the  tensor fascia lata. Electrocautery was used to control all bleeders. I dissected down sharply to the fascia of the tensor fascia lata was confirmed that the muscle fibers beneath were running posteriorly. I then incised the fascia over the superficial tensor fascia lata in line with the incision. The fascia was elevated off the anterior aspect of the muscle the muscle was retracted posteriorly and protected throughout the case. I then used electrocautery to incise the tensor fascia lata fascia control and all bleeders. Immediately visible was the fat over top of the anterior neck and capsule.  I removed the anterior fat from the capsule and elevated the rectus muscle off of the anterior capsule. I then removed a large time of capsule. The retractors were then placed over the anterior acetabulum as well as around the superior and inferior neck.  I then made a femoral neck cut. Then used the power corkscrew to remove the femoral head from the acetabulum and thoroughly irrigated the acetabulum. I sized the femoral head.    I then exposed the deep acetabulum, cleared out any tissue including the ligamentum teres.   After adequate visualization, I excised the labrum, and then sequentially reamed.  I then impacted the acetabular implant into place using fluoroscopy for guidance.  Appropriate version and inclination was confirmed clinically matching their bony anatomy, and with fluoroscopy.  I placed a 20 mm screw in the posterior/superio position with an excellent bite.    I then placed the polyethylene liner in  place  I then adducted the leg and released the external rotators from the posterior femur allowing it to be easily delivered up lateral and anterior to the acetabulum for preparation of the femoral canal.    I then prepared the proximal femur using the cookie-cutter and then sequentially reamed and broached.  A trial broach, neck, and head was utilized, and I reduced the hip and used floroscopy to  assess the neck length and femoral implant.  I then impacted the femoral prosthesis into place into the appropriate version. The hip was then reduced and fluoroscopy confirmed appropriate position. Leg lengths were restored.  I then irrigated the hip copiously again with, and repaired the fascia with Vicryl, followed by monocryl for the subcutaneous tissue, Monocryl for the skin, Steri-Strips and sterile gauze. The patient was then awakened and returned to PACU in stable and satisfactory condition. There were no complications.  POST OPERATIVE PLAN: WBAT, DVT px: SCD's/TED, ambulation and chemical dvt px  Sarah Loseke, MD Orthopedic Surgeon [phone]     

## 2020-08-26 NOTE — Interval H&P Note (Signed)
History and Physical Interval Note:  08/26/2020 7:09 AM  Sarah Walker  has presented today for surgery, with the diagnosis of OA RIGHT HIP.  The various methods of treatment have been discussed with the patient and family. After consideration of risks, benefits and other options for treatment, the patient has consented to  Procedure(s): TOTAL HIP ARTHROPLASTY ANTERIOR APPROACH (Right) as a surgical intervention.  The patient's history has been reviewed, patient examined, no change in status, stable for surgery.  I have reviewed the patient's chart and labs.  Questions were answered to the patient's satisfaction.     Renette Butters

## 2020-08-26 NOTE — Anesthesia Preprocedure Evaluation (Signed)
Anesthesia Evaluation  Patient identified by MRN, date of birth, ID band Patient awake    Reviewed: Allergy & Precautions, NPO status , Patient's Chart, lab work & pertinent test results  History of Anesthesia Complications Negative for: history of anesthetic complications  Airway Mallampati: III  TM Distance: >3 FB Neck ROM: Full    Dental  (+) Dental Advisory Given, Teeth Intact   Pulmonary neg pulmonary ROS,    breath sounds clear to auscultation       Cardiovascular hypertension, Pt. on medications (-) angina(-) Past MI and (-) CHF  Rhythm:Regular     Neuro/Psych negative neurological ROS  negative psych ROS   GI/Hepatic negative GI ROS, Neg liver ROS,   Endo/Other  negative endocrine ROS  Renal/GU negative Renal ROS     Musculoskeletal  (+) Arthritis ,   Abdominal   Peds  Hematology Lab Results      Component                Value               Date                      WBC                      5.0                 08/13/2020                HGB                      12.8                08/13/2020                HCT                      39.6                08/13/2020                MCV                      98.8                08/13/2020                PLT                      158                 08/13/2020            Previous abnormal coags in setting of blood thinners during ovarian cancer treatment. No current blood thinners per patient   Anesthesia Other Findings   Reproductive/Obstetrics                             Anesthesia Physical Anesthesia Plan  ASA: 2  Anesthesia Plan: MAC and Spinal   Post-op Pain Management:    Induction:   PONV Risk Score and Plan: 2 and Propofol infusion  Airway Management Planned: Nasal Cannula  Additional Equipment: None  Intra-op Plan:   Post-operative Plan:   Informed Consent: I have reviewed the patients History and Physical, chart,  labs and discussed the  procedure including the risks, benefits and alternatives for the proposed anesthesia with the patient or authorized representative who has indicated his/her understanding and acceptance.     Dental advisory given  Plan Discussed with: CRNA and Anesthesiologist  Anesthesia Plan Comments:         Anesthesia Quick Evaluation

## 2020-08-26 NOTE — Evaluation (Signed)
Physical Therapy Evaluation Patient Details Name: Sarah Walker MRN: [id number] DOB: 12-24-1950 Today's Date: 08/26/2020   History of Present Illness  Patient is 70 y.o. female s/p Rt THA anterior appraoch on 08/26/20 with PMH significant for OA, HTN, ovarian cnacer, neuropathy.    Clinical Impression  Sarah Walker is a 70 y.o. female POD 0 s/p Rt THA. Patient reports modified independence with RW for mobility at baseline due to hip pain. Patient is now limited by functional impairments (see PT problem list below) and requires min assist for transfers and gait with RW. Patient was able to ambulate ~45 feet with RW and min assist. Patient instructed in exercise to facilitate circulation to manage edema and reduce risk of DVT. Patient will benefit from continued skilled PT interventions to address impairments and progress towards PLOF. Acute PT will follow to progress mobility and stair training in preparation for safe discharge home.     Follow Up Recommendations Follow surgeon's recommendation for DC plan and follow-up therapies;Home health PT (pt requesting HHPT initially vs OPPT)    Equipment Recommendations  None recommended by PT    Recommendations for Other Services       Precautions / Restrictions Precautions Precautions: Fall Restrictions Weight Bearing Restrictions: No Other Position/Activity Restrictions: WBAT      Mobility  Bed Mobility Overal bed mobility: Needs Assistance Bed Mobility: Supine to Sit     Supine to sit: Min assist;HOB elevated     General bed mobility comments: cues to reach for bed rail, assist to bring Bil LE off EOB    Transfers Overall transfer level: Needs assistance Equipment used: Rolling walker (2 wheeled) Transfers: Sit to/from Stand Sit to Stand: Min assist         General transfer comment: cues for hand placement/technique with RW, assist to initiate and complete power up to walker.  Ambulation/Gait Ambulation/Gait  assistance: Min assist Gait Distance (Feet): 45 Feet Assistive device: Rolling walker (2 wheeled) Gait Pattern/deviations: Step-through pattern;Decreased stride length;Decreased weight shift to right Gait velocity: decr   General Gait Details: cues for step pattern, pt leading with Lt LE due to pain advancing Rt LE. no buckling or overt LOB noted.  Stairs            Wheelchair Mobility    Modified Rankin (Stroke Patients Only)       Balance Overall balance assessment: Needs assistance Sitting-balance support: Feet supported Sitting balance-Leahy Scale: Good     Standing balance support: During functional activity;Bilateral upper extremity supported Standing balance-Leahy Scale: Poor                               Pertinent Vitals/Pain Pain Assessment: 0-10 Pain Score: 6  Pain Location: Rt hip Pain Descriptors / Indicators: Aching;Discomfort Pain Intervention(s): Limited activity within patient's tolerance;Monitored during session;Repositioned;Ice applied    Home Living Family/patient expects to be discharged to:: Private residence Living Arrangements: Spouse/significant other Available Help at Discharge: Family Type of Home: House Home Access: Stairs to enter Entrance Stairs-Rails: None Entrance Stairs-Number of Steps: 1+1 Home Layout: Multi-level Home Equipment: Grab bars - tub/shower;Walker - 2 wheels;Cane - single point;Bedside commode;Shower seat      Prior Function Level of Independence: Independent with assistive device(s)   Gait / Transfers Assistance Needed: has been using walker since January due to hip pain           Hand Dominance   Dominant Hand: Right  Extremity/Trunk Assessment   Upper Extremity Assessment Upper Extremity Assessment: Overall WFL for tasks assessed    Lower Extremity Assessment Lower Extremity Assessment: RLE deficits/detail;Overall WFL for tasks assessed RLE Deficits / Details: good quad  activation RLE Sensation: history of peripheral neuropathy RLE Coordination: WNL    Cervical / Trunk Assessment Cervical / Trunk Assessment: Normal  Communication   Communication: No difficulties  Cognition Arousal/Alertness: Awake/alert Behavior During Therapy: WFL for tasks assessed/performed Overall Cognitive Status: Within Functional Limits for tasks assessed                                        General Comments      Exercises Total Joint Exercises Ankle Circles/Pumps: AROM;Both;20 reps;Seated   Assessment/Plan    PT Assessment Patient needs continued PT services  PT Problem List Decreased strength;Decreased range of motion;Decreased balance;Decreased activity tolerance;Decreased mobility;Decreased knowledge of use of DME;Decreased knowledge of precautions;Pain       PT Treatment Interventions DME instruction;Gait training;Stair training;Functional mobility training;Therapeutic activities;Therapeutic exercise;Balance training;Neuromuscular re-education;Patient/family education    PT Goals (Current goals can be found in the Care Plan section)  Acute Rehab PT Goals Patient Stated Goal: regain independence and be able to go shopping and do acitvities without pain PT Goal Formulation: With patient Time For Goal Achievement: 09/02/20 Potential to Achieve Goals: Good    Frequency 7X/week   Barriers to discharge        Co-evaluation               AM-PAC PT "6 Clicks" Mobility  Outcome Measure Help needed turning from your back to your side while in a flat bed without using bedrails?: A Little Help needed moving from lying on your back to sitting on the side of a flat bed without using bedrails?: A Little Help needed moving to and from a bed to a chair (including a wheelchair)?: A Little Help needed standing up from a chair using your arms (e.g., wheelchair or bedside chair)?: A Little Help needed to walk in hospital room?: A Little Help needed  climbing 3-5 steps with a railing? : A Lot 6 Click Score: 17    End of Session Equipment Utilized During Treatment: Gait belt Activity Tolerance: Patient tolerated treatment well Patient left: in chair;with call bell/phone within reach;with chair alarm set Nurse Communication: Mobility status PT Visit Diagnosis: Muscle weakness (generalized) (M62.81);Difficulty in walking, not elsewhere classified (R26.2)    Time: [id number] PT Time Calculation (min) (ACUTE ONLY): 30 min   Charges:   PT Evaluation $PT Eval Low Complexity: 1 Low PT Treatments $Gait Training: 8-22 mins        Verner Mould, DPT Acute Rehabilitation Services Office [phone] Pager [phone]   Jacques Navy 08/26/2020, 3:45 PM

## 2020-08-26 NOTE — Transfer of Care (Signed)
Immediate Anesthesia Transfer of Care Note  Patient: Sarah Walker  Procedure(s) Performed: Procedure(s): TOTAL HIP ARTHROPLASTY ANTERIOR APPROACH (Right)  Patient Location: PACU  Anesthesia Type:Spinal  Level of Consciousness:  sedated, patient cooperative and responds to stimulation  Airway & Oxygen Therapy:Patient Spontanous Breathing and Patient connected to face mask oxgen  Post-op Assessment:  Report given to PACU RN and Post -op Vital signs reviewed and stable  Post vital signs:  Reviewed and stable  Last Vitals:  Vitals:   08/26/20 0603  BP: (!) 141/89  Pulse: (!) 59  Resp: 20  Temp: 36.7 C  SpO2: [id number]    Complications: No apparent anesthesia complications

## 2020-08-26 NOTE — Anesthesia Procedure Notes (Signed)
Spinal  Patient location during procedure: OR Start time: 08/26/2020 7:30 AM End time: 08/26/2020 7:41 AM Reason for block: surgical anesthesia Staffing Performed: anesthesiologist  Anesthesiologist: Oleta Mouse, MD Preanesthetic Checklist Completed: patient identified, IV checked, risks and benefits discussed, surgical consent, monitors and equipment checked, pre-op evaluation and timeout performed Spinal Block Patient position: sitting Prep: DuraPrep Patient monitoring: heart rate, cardiac monitor, continuous pulse ox and blood pressure Approach: midline Location: L4-5 Injection technique: single-shot Needle Needle type: Pencan  Needle gauge: 24 G Needle length: 9 cm Assessment Sensory level: T6 Events: CSF return and second provider

## 2020-08-26 NOTE — Plan of Care (Signed)
  Problem: Clinical Measurements: Goal: Will remain free from infection Outcome: Progressing   Problem: Pain Managment: Goal: General experience of comfort will improve Outcome: Progressing   Problem: Activity: Goal: Ability to avoid complications of mobility impairment will improve Outcome: Progressing

## 2020-08-26 NOTE — Discharge Instructions (Addendum)
You may bear weight as tolerated. Keep your dressing on and dry until follow up. Take medicine to prevent blood clots as directed. Take pain medicine as needed with the goal of transitioning to over the counter medicines.    INSTRUCTIONS AFTER JOINT REPLACEMENT   Remove items at home which could result in a fall. This includes throw rugs or furniture in walking pathways ICE to the affected joint every three hours while awake for 30 minutes at a time, for at least the first 3-5 days, and then as needed for pain and swelling.  Continue to use ice for pain and swelling. You may notice swelling that will progress down to the foot and ankle.  This is normal after surgery.  Elevate your leg when you are not up walking on it.   Continue to use the breathing machine you got in the hospital (incentive spirometer) which will help keep your temperature down.  It is common for your temperature to cycle up and down following surgery, especially at night when you are not up moving around and exerting yourself.  The breathing machine keeps your lungs expanded and your temperature down.   DIET:  As you were doing prior to hospitalization, we recommend a well-balanced diet.  DRESSING / WOUND CARE / SHOWERING  You may shower 3 days after surgery, but keep the wounds dry during showering.  You may use an occlusive plastic wrap (Press'n Seal for example) with blue painter's tape at edges, NO SOAKING/SUBMERGING IN THE BATHTUB.  If the bandage gets wet, call the office.   ACTIVITY  Increase activity slowly as tolerated, but follow the weight bearing instructions below.   No driving for 6 weeks or until further direction given by your physician.  You cannot drive while taking narcotics.  No lifting or carrying greater than 10 lbs. until further directed by your surgeon. Avoid periods of inactivity such as sitting longer than an hour when not asleep. This helps prevent blood clots.  You may return to work once you  are authorized by your doctor.    WEIGHT BEARING   Weight bearing as tolerated with assist device (walker, cane, etc) as directed, use it as long as suggested by your surgeon or therapist, typically at least 4-6 weeks.   EXERCISES  Results after joint replacement surgery are often greatly improved when you follow the exercise, range of motion and muscle strengthening exercises prescribed by your doctor. Safety measures are also important to protect the joint from further injury. Any time any of these exercises cause you to have increased pain or swelling, decrease what you are doing until you are comfortable again and then slowly increase them. If you have problems or questions, call your caregiver or physical therapist for advice.   Rehabilitation is important following a joint replacement. After just a few days of immobilization, the muscles of the leg can become weakened and shrink (atrophy).  These exercises are designed to build up the tone and strength of the thigh and leg muscles and to improve motion. Often times heat used for twenty to thirty minutes before working out will loosen up your tissues and help with improving the range of motion but do not use heat for the first two weeks following surgery (sometimes heat can increase post-operative swelling).   These exercises can be done on a training (exercise) mat, on the floor, on a table or on a bed. Use whatever works the best and is most comfortable for you.  Use music or television while you are exercising so that the exercises are a pleasant break in your day. This will make your life better with the exercises acting as a break in your routine that you can look forward to.   Perform all exercises about fifteen times, three times per day or as directed.  You should exercise both the operative leg and the other leg as well.  Exercises include:   Quad Sets - Tighten up the muscle on the front of the thigh (Quad) and hold for 5-10  seconds.   Straight Leg Raises - With your knee straight (if you were given a brace, keep it on), lift the leg to 60 degrees, hold for 3 seconds, and slowly lower the leg.  Perform this exercise against resistance later as your leg gets stronger.  Leg Slides: Lying on your back, slowly slide your foot toward your buttocks, bending your knee up off the floor (only go as far as is comfortable). Then slowly slide your foot back down until your leg is flat on the floor again.  Angel Wings: Lying on your back spread your legs to the side as far apart as you can without causing discomfort.  Hamstring Strength:  Lying on your back, push your heel against the floor with your leg straight by tightening up the muscles of your buttocks.  Repeat, but this time bend your knee to a comfortable angle, and push your heel against the floor.  You may put a pillow under the heel to make it more comfortable if necessary.   A rehabilitation program following joint replacement surgery can speed recovery and prevent re-injury in the future due to weakened muscles. Contact your doctor or a physical therapist for more information on knee rehabilitation.    CONSTIPATION  Constipation is defined medically as fewer than three stools per week and severe constipation as less than one stool per week.  Even if you have a regular bowel pattern at home, your normal regimen is likely to be disrupted due to multiple reasons following surgery.  Combination of anesthesia, postoperative narcotics, change in appetite and fluid intake all can affect your bowels.   YOU MUST use at least one of the following options; they are listed in order of increasing strength to get the job done.  They are all available over the counter, and you may need to use some, POSSIBLY even all of these options:    Drink plenty of fluids (prune juice may be helpful) and high fiber foods Colace 100 mg by mouth twice a day  Senokot for constipation as directed and  as needed Dulcolax (bisacodyl), take with full glass of water  Miralax (polyethylene glycol) once or twice a day as needed.  If you have tried all these things and are unable to have a bowel movement in the first 3-4 days after surgery call either your surgeon or your primary doctor.    If you experience loose stools or diarrhea, hold the medications until you stool forms back up.  If your symptoms do not get better within 1 week or if they get worse, check with your doctor.  If you experience "the worst abdominal pain ever" or develop nausea or vomiting, please contact the office immediately for further recommendations for treatment.   ITCHING:  If you experience itching with your medications, try taking only a single pain pill, or even half a pain pill at a time.  You can also use Benadryl over the counter  for itching or also to help with sleep.   TED HOSE STOCKINGS:  Use stockings on both legs until for at least 2 weeks or as directed by physician office. They may be removed at night for sleeping.  MEDICATIONS:  See your medication summary on the "After Visit Summary" that nursing will review with you.  You may have some home medications which will be placed on hold until you complete the course of blood thinner medication.  It is important for you to complete the blood thinner medication as prescribed.  Take medicines as prescribed.   You have several different medicines that work in different ways. - Tylenol is for mild to moderate pain. Try to take this medicine before turning to your narcotic medicines.  - Robaxin is for muscle spasms. This medicine can make you drowsy. - Norco/Vicodin is a narcotic pain medicine.  Take this for severe pain. This medicine can be dehydrating / constipating. - Zofran is for nausea and vomiting. - Omeprazole is for gastric protection while taking pain medicine.  - Xarelto is to prevent blood clots after surgery. YOU MUST TAKE THIS MEDICINE!  PRECAUTIONS:   If you experience chest pain or shortness of breath - call 911 immediately for transfer to the hospital emergency department.   If you develop a fever greater that 101 F, purulent drainage from wound, increased redness or drainage from wound, foul odor from the wound/dressing, or calf pain - CONTACT YOUR SURGEON.                                                   FOLLOW-UP APPOINTMENTS:  If you do not already have a post-op appointment, please call the office [phone] for an appointment to be seen by Dr. Percell Miller in 2 weeks.   OTHER INSTRUCTIONS:   MAKE SURE YOU:  Understand these instructions.  Get help right away if you are not doing well or get worse.    Thank you for letting us be a part of your medical care team.  It is a privilege we respect greatly.  We hope these instructions will help you stay on track for a fast and full recovery!         POST-OPERATIVE OPIOID TAPER INSTRUCTIONS: It is important to wean off of your opioid medication as soon as possible. If you do not need pain medication after your surgery it is ok to stop day one. Opioids include: Codeine, Hydrocodone(Norco, Vicodin), Oxycodone(Percocet, oxycontin) and hydromorphone amongst others.  Long term and even short term use of opiods can cause: Increased pain response Dependence Constipation Depression Respiratory depression And more.  Withdrawal symptoms can include Flu like symptoms Nausea, vomiting And more Techniques to manage these symptoms Hydrate well Eat regular healthy meals Stay active Use relaxation techniques(deep breathing, meditating, yoga) Do Not substitute Alcohol to help with tapering If you have been on opioids for less than two weeks and do not have pain than it is ok to stop all together.  Plan to wean off of opioids This plan should start within one week post op of your joint replacement. Maintain the same interval or time between taking each dose and first decrease the dose.  Cut  the total daily intake of opioids by one tablet each day Next start to increase the time between doses. The last dose that should be  eliminated is the evening dose.     Information on my medicine - XARELTO (Rivaroxaban)  Why was Xarelto prescribed for you? Xarelto was prescribed for you to reduce the risk of blood clots forming after orthopedic surgery. The medical term for these abnormal blood clots is venous thromboembolism (VTE).  What do you need to know about xarelto ? Take your Xarelto ONCE DAILY at the same time every day. You may take it either with or without food.  If you have difficulty swallowing the tablet whole, you may crush it and mix in applesauce just prior to taking your dose.  Take Xarelto exactly as prescribed by your doctor and DO NOT stop taking Xarelto without talking to the doctor who prescribed the medication.  Stopping without other VTE prevention medication to take the place of Xarelto may increase your risk of developing a clot.  After discharge, you should have regular check-up appointments with your healthcare provider that is prescribing your Xarelto.    What do you do if you miss a dose? If you miss a dose, take it as soon as you remember on the same day then continue your regularly scheduled once daily regimen the next day. Do not take two doses of Xarelto on the same day.   Important Safety Information A possible side effect of Xarelto is bleeding. You should call your healthcare provider right away if you experience any of the following: Bleeding from an injury or your nose that does not stop. Unusual colored urine (red or dark brown) or unusual colored stools (red or black). Unusual bruising for unknown reasons. A serious fall or if you hit your head (even if there is no bleeding).  Some medicines may interact with Xarelto and might increase your risk of bleeding while on Xarelto. To help avoid this, consult your healthcare provider or  pharmacist prior to using any new prescription or non-prescription medications, including herbals, vitamins, non-steroidal anti-inflammatory drugs (NSAIDs) and supplements.  This website has more information on Xarelto: [email or web address]

## 2020-08-27 DIAGNOSIS — Z7982 Long term (current) use of aspirin: Secondary | ICD-10-CM | POA: Diagnosis not present

## 2020-08-27 DIAGNOSIS — M1611 Unilateral primary osteoarthritis, right hip: Secondary | ICD-10-CM | POA: Diagnosis not present

## 2020-08-27 DIAGNOSIS — I10 Essential (primary) hypertension: Secondary | ICD-10-CM | POA: Diagnosis not present

## 2020-08-27 DIAGNOSIS — Z79899 Other long term (current) drug therapy: Secondary | ICD-10-CM | POA: Diagnosis not present

## 2020-08-27 NOTE — Progress Notes (Signed)
Provided discharge education/instructions, all questions and concerns addressed, Pt not in distress. Pt to discharge home with RW and belongings accompanied by husband.

## 2020-08-27 NOTE — Progress Notes (Addendum)
Physical Therapy Treatment Patient Details Name: Sarah Walker MRN: [id number] DOB: 1950-05-17 Today's Date: 08/27/2020    History of Present Illness Patient is 70 y.o. female s/p Rt THA anterior appraoch on 08/26/20 with PMH significant for OA, HTN, ovarian cnacer, neuropathy.    PT Comments    Patient progressing well with acute PT. Pt required cues for technique to complete transfers and min assist to fully power up from low surface of recliner and toilet. Pt progressed gait to ~80' with RW and min guard/supervision for safety. Pt also initiated stair training for single curb and car runner board negotiation. Instructed pt on safe step up technique for forward and reverse sequence, no LOB noted. Min assist/guard to stabilize walker and for safety. Will follow up for PM session to further progress stair training and review HEP.    Follow Up Recommendations  Follow surgeon's recommendation for DC plan and follow-up therapies;Home health PT (pt requesting HHPT initially vs OPPT)     Equipment Recommendations  None recommended by PT    Recommendations for Other Services       Precautions / Restrictions Precautions Precautions: Fall Restrictions Weight Bearing Restrictions: Yes RLE Weight Bearing: Weight bearing as tolerated Other Position/Activity Restrictions: WBAT    Mobility  Bed Mobility               General bed mobility comments: pt OOB in recliner    Transfers Overall transfer level: Needs assistance Equipment used: Rolling walker (2 wheeled) Transfers: Sit to/from Stand Sit to Stand: Min assist         General transfer comment: cues for bil UE use on armrest to power up from recliner. min assist to complete full rise. cues for safe use of grab bar in bathroom with reach back to control lowering.  Ambulation/Gait Ambulation/Gait assistance: Min guard;Supervision Gait Distance (Feet): 80 Feet Assistive device: Rolling walker (2 wheeled) Gait  Pattern/deviations: Step-through pattern;Decreased stride length;Decreased weight shift to right Gait velocity: decr   General Gait Details: cues for safe step pattern leading with Lt LE to reduce Rt hip pain with step advancement. pt ambulated to bathroom and then in hallway. no overt LOB noted. pt progressed to supervision and maintained safe walker position.   Stairs    08/27/20 0955  Ambulation/Gait  Stairs Yes  Stairs assistance Min guard;Min assist  Stair Management No rails;Step to pattern;Backwards;Forwards;With walker  Number of Stairs 2  General stair comments cues for step/curb sequencing with forward/reverse step up technique and min guard/assit for walker positioning.             Wheelchair Mobility    Modified Rankin (Stroke Patients Only)       Balance Overall balance assessment: Needs assistance Sitting-balance support: Feet supported Sitting balance-Leahy Scale: Good     Standing balance support: During functional activity;Bilateral upper extremity supported Standing balance-Leahy Scale: Fair                              Cognition Arousal/Alertness: Awake/alert Behavior During Therapy: WFL for tasks assessed/performed Overall Cognitive Status: Within Functional Limits for tasks assessed                                        Exercises Total Joint Exercises Ankle Circles/Pumps: AROM;Both;20 reps;Seated    General Comments  Pertinent Vitals/Pain Pain Assessment: Faces Faces Pain Scale: Hurts little more Pain Location: Rt hip Pain Descriptors / Indicators: Aching;Discomfort Pain Intervention(s): Limited activity within patient's tolerance;Monitored during session;Repositioned;Ice applied    Home Living                      Prior Function            PT Goals (current goals can now be found in the care plan section) Acute Rehab PT Goals Patient Stated Goal: regain independence and be able to go  shopping and do acitvities without pain PT Goal Formulation: With patient Time For Goal Achievement: 09/02/20 Potential to Achieve Goals: Good Progress towards PT goals: Progressing toward goals    Frequency    7X/week      PT Plan      Co-evaluation              AM-PAC PT "6 Clicks" Mobility   Outcome Measure  Help needed turning from your back to your side while in a flat bed without using bedrails?: A Little Help needed moving from lying on your back to sitting on the side of a flat bed without using bedrails?: A Little Help needed moving to and from a bed to a chair (including a wheelchair)?: A Little Help needed standing up from a chair using your arms (e.g., wheelchair or bedside chair)?: A Little Help needed to walk in hospital room?: A Little Help needed climbing 3-5 steps with a railing? : A Lot 6 Click Score: 17    End of Session Equipment Utilized During Treatment: Gait belt Activity Tolerance: Patient tolerated treatment well Patient left: in chair;with call bell/phone within reach;with chair alarm set Nurse Communication: Mobility status PT Visit Diagnosis: Muscle weakness (generalized) (M62.81);Difficulty in walking, not elsewhere classified (R26.2)     Time: [id number] PT Time Calculation (min) (ACUTE ONLY): 23 min  Charges:  $Gait Training: 8-22 mins $Therapeutic Exercise: 8-22 mins                     Verner Mould, DPT Acute Rehabilitation Services Office [phone] Pager [phone]    Jacques Navy 08/27/2020, 1:46 PM

## 2020-08-27 NOTE — Plan of Care (Signed)

## 2020-08-27 NOTE — Plan of Care (Signed)
  Problem: Activity: Goal: Ability to avoid complications of mobility impairment will improve Outcome: Progressing   Problem: Activity: Goal: Ability to tolerate increased activity will improve Outcome: Progressing   Problem: Pain Management: Goal: Pain level will decrease with appropriate interventions Outcome: Progressing   Problem: Safety: Goal: Ability to remain free from injury will improve Outcome: Progressing

## 2020-08-27 NOTE — Care Plan (Signed)
Spoke with patient and husband this am. She is agreeable to continuing with the plan of OPPT. It is scheduled for 08/28/20 at [facility] OPPT- [facility].  Questions answered.   Ladell Heads, [place]

## 2020-08-27 NOTE — TOC Transition Note (Signed)
Transition of Care [facility]) - CM/SW Discharge Note   Patient Details  Name: Sarah Walker MRN: [id number] Date of Birth: Jan 03, 1951  Transition of Care [facility]) CM/SW Contact:  Lennart Pall, LCSW Phone Number: 08/27/2020, 9:24 AM   Clinical Narrative:    Met with pt to confirm she has received youth rw via [place].  Plan for OPPT at [facility].  (Pt asking about HHPT and has now spoken with Ortho bundle CM to clarify need to stick with OPPT plan.)  No further TOC needs.   Final next level of care: OP Rehab Barriers to Discharge: No Barriers Identified   Patient Goals and CMS Choice Patient states their goals for this hospitalization and ongoing recovery are:: return home      Discharge Placement                       Discharge Plan and Services                DME Arranged: [facility] youth DME Agency: [employer] Date DME Agency Contacted:  (prearranged for deliver to hospital room today)                Social Determinants of Health (SDOH) Interventions     Readmission Risk Interventions No flowsheet data found.

## 2020-08-27 NOTE — Discharge Summary (Signed)
Physician Discharge Summary  Patient ID: Sarah Walker MRN: [id number] DOB/AGE: 1950/02/08 70 y.o.  Admit date: 08/26/2020 Discharge date: 08/27/2020  Admission Diagnoses: right hip OA  Discharge Diagnoses:  Active Problems:   S/P total right hip arthroplasty   Discharged Condition: good  Hospital Course: Patient underwent a right total hip arthroplasty with Dr. Percell Miller on [id number] without complications. She spent the night in observation for pain control and mobilization. She has passed her PT/OT evals and is ready to go home with OPPT already set up.  Consults: None  Significant Diagnostic Studies: none  Treatments: IV hydration, antibiotics: Ancef, analgesia: acetaminophen, acetaminophen w/ codeine, Vicodin, Dilaudid, and Morphine, anticoagulation: Xarelto, and surgery: right THA  Discharge Exam: Blood pressure 125/72, pulse 64, temperature 98 F (36.7 C), temperature source Oral, resp. rate 14, height 5' (1.524 m), weight 90.4 kg, SpO2 100 %. General appearance: alert, cooperative, and no distress Head: Normocephalic, without obvious abnormality, atraumatic Eyes: conjunctivae/corneas clear. PERRL, EOM's intact. Fundi benign. Resp: clear to auscultation bilaterally Cardio: regular rate and rhythm, S1, S2 normal, no murmur, click, rub or gallop GI: soft, non-tender; bowel sounds normal; no masses,  no organomegaly Extremities: extremities normal, atraumatic, no cyanosis or edema Pulses:  L brachial 2+ R brachial 2+  L radial 2+ R radial 2+  L inguinal 2+ R inguinal 2+  L popliteal 2+ R popliteal 2+  L posterior tibial 2+ R posterior tibial 2+  L dorsalis pedis 2+ R dorsalis pedis 2+   Skin: Skin color, texture, turgor normal. No rashes or lesions Neurologic: Alert and oriented X 3, normal strength and tone. Normal symmetric reflexes. Normal coordination and gait Incision/Wound: c/d/i  Disposition: Discharge disposition: 01-Home or Self Care      Discharge  Instructions     Call MD / Call 911   Complete by: As directed    If you experience chest pain or shortness of breath, CALL 911 and be transported to the hospital emergency room.  If you develope a fever above 101 F, pus (white drainage) or increased drainage or redness at the wound, or calf pain, call your surgeon's office.   Diet - low sodium heart healthy   Complete by: As directed    Discharge instructions   Complete by: As directed    You may bear weight as tolerated. Keep your dressing on and dry until follow up. Take medicine to prevent blood clots as directed. Take pain medicine as needed with the goal of transitioning to over the counter medicines.    INSTRUCTIONS AFTER JOINT REPLACEMENT   Remove items at home which could result in a fall. This includes throw rugs or furniture in walking pathways ICE to the affected joint every three hours while awake for 30 minutes at a time, for at least the first 3-5 days, and then as needed for pain and swelling.  Continue to use ice for pain and swelling. You may notice swelling that will progress down to the foot and ankle.  This is normal after surgery.  Elevate your leg when you are not up walking on it.   Continue to use the breathing machine you got in the hospital (incentive spirometer) which will help keep your temperature down.  It is common for your temperature to cycle up and down following surgery, especially at night when you are not up moving around and exerting yourself.  The breathing machine keeps your lungs expanded and your temperature down.   DIET:  As you were doing  prior to hospitalization, we recommend a well-balanced diet.  DRESSING / WOUND CARE / SHOWERING  You may shower 3 days after surgery, but keep the wounds dry during showering.  You may use an occlusive plastic wrap (Press'n Seal for example) with blue painter's tape at edges, NO SOAKING/SUBMERGING IN THE BATHTUB.  If the bandage gets wet, call the office.    ACTIVITY  Increase activity slowly as tolerated, but follow the weight bearing instructions below.   No driving for 6 weeks or until further direction given by your physician.  You cannot drive while taking narcotics.  No lifting or carrying greater than 10 lbs. until further directed by your surgeon. Avoid periods of inactivity such as sitting longer than an hour when not asleep. This helps prevent blood clots.  You may return to work once you are authorized by your doctor.    WEIGHT BEARING   Weight bearing as tolerated with assist device (walker, cane, etc) as directed, use it as long as suggested by your surgeon or therapist, typically at least 4-6 weeks.   EXERCISES  Results after joint replacement surgery are often greatly improved when you follow the exercise, range of motion and muscle strengthening exercises prescribed by your doctor. Safety measures are also important to protect the joint from further injury. Any time any of these exercises cause you to have increased pain or swelling, decrease what you are doing until you are comfortable again and then slowly increase them. If you have problems or questions, call your caregiver or physical therapist for advice.   Rehabilitation is important following a joint replacement. After just a few days of immobilization, the muscles of the leg can become weakened and shrink (atrophy).  These exercises are designed to build up the tone and strength of the thigh and leg muscles and to improve motion. Often times heat used for twenty to thirty minutes before working out will loosen up your tissues and help with improving the range of motion but do not use heat for the first two weeks following surgery (sometimes heat can increase post-operative swelling).   These exercises can be done on a training (exercise) mat, on the floor, on a table or on a bed. Use whatever works the best and is most comfortable for you.    Use music or television while  you are exercising so that the exercises are a pleasant break in your day. This will make your life better with the exercises acting as a break in your routine that you can look forward to.   Perform all exercises about fifteen times, three times per day or as directed.  You should exercise both the operative leg and the other leg as well.  Exercises include:   Quad Sets - Tighten up the muscle on the front of the thigh (Quad) and hold for 5-10 seconds.   Straight Leg Raises - With your knee straight (if you were given a brace, keep it on), lift the leg to 60 degrees, hold for 3 seconds, and slowly lower the leg.  Perform this exercise against resistance later as your leg gets stronger.  Leg Slides: Lying on your back, slowly slide your foot toward your buttocks, bending your knee up off the floor (only go as far as is comfortable). Then slowly slide your foot back down until your leg is flat on the floor again.  Angel Wings: Lying on your back spread your legs to the side as far apart as you can without  causing discomfort.  Hamstring Strength:  Lying on your back, push your heel against the floor with your leg straight by tightening up the muscles of your buttocks.  Repeat, but this time bend your knee to a comfortable angle, and push your heel against the floor.  You may put a pillow under the heel to make it more comfortable if necessary.   A rehabilitation program following joint replacement surgery can speed recovery and prevent re-injury in the future due to weakened muscles. Contact your doctor or a physical therapist for more information on knee rehabilitation.    CONSTIPATION  Constipation is defined medically as fewer than three stools per week and severe constipation as less than one stool per week.  Even if you have a regular bowel pattern at home, your normal regimen is likely to be disrupted due to multiple reasons following surgery.  Combination of anesthesia, postoperative narcotics,  change in appetite and fluid intake all can affect your bowels.   YOU MUST use at least one of the following options; they are listed in order of increasing strength to get the job done.  They are all available over the counter, and you may need to use some, POSSIBLY even all of these options:    Drink plenty of fluids (prune juice may be helpful) and high fiber foods Colace 100 mg by mouth twice a day  Senokot for constipation as directed and as needed Dulcolax (bisacodyl), take with full glass of water  Miralax (polyethylene glycol) once or twice a day as needed.  If you have tried all these things and are unable to have a bowel movement in the first 3-4 days after surgery call either your surgeon or your primary doctor.    If you experience loose stools or diarrhea, hold the medications until you stool forms back up.  If your symptoms do not get better within 1 week or if they get worse, check with your doctor.  If you experience "the worst abdominal pain ever" or develop nausea or vomiting, please contact the office immediately for further recommendations for treatment.   ITCHING:  If you experience itching with your medications, try taking only a single pain pill, or even half a pain pill at a time.  You can also use Benadryl over the counter for itching or also to help with sleep.   TED HOSE STOCKINGS:  Use stockings on both legs until for at least 2 weeks or as directed by physician office. They may be removed at night for sleeping.  MEDICATIONS:  See your medication summary on the "After Visit Summary" that nursing will review with you.  You may have some home medications which will be placed on hold until you complete the course of blood thinner medication.  It is important for you to complete the blood thinner medication as prescribed.  Take medicines as prescribed.   You have several different medicines that work in different ways. - Tylenol is for mild to moderate pain. Try to take  this medicine before turning to your narcotic medicines.  - Robaxin is for muscle spasms. This medicine can make you drowsy. - Norco is a narcotic pain medicine.  Take this for severe pain. This medicine can be dehydrating / constipating. - Zofran is for nausea and vomiting. - Omeprazole is for gastric protection while taking pain medicine.  - Xarelto is to prevent blood clots after surgery. YOU MUST TAKE THIS MEDICINE!!  PRECAUTIONS:  If you experience chest pain or shortness  of breath - call 911 immediately for transfer to the hospital emergency department.   If you develop a fever greater that 101 F, purulent drainage from wound, increased redness or drainage from wound, foul odor from the wound/dressing, or calf pain - CONTACT YOUR SURGEON.                                                   FOLLOW-UP APPOINTMENTS:  If you do not already have a post-op appointment, please call the office [phone] for an appointment to be seen by Dr. Percell Miller in 2 weeks.   OTHER INSTRUCTIONS:   MAKE SURE YOU:  Understand these instructions.  Get help right away if you are not doing well or get worse.    Thank you for letting us be a part of your medical care team.  It is a privilege we respect greatly.  We hope these instructions will help you stay on track for a fast and full recovery!   Post-operative opioid taper instructions:   Complete by: As directed    POST-OPERATIVE OPIOID TAPER INSTRUCTIONS: It is important to wean off of your opioid medication as soon as possible. If you do not need pain medication after your surgery it is ok to stop day one. Opioids include: Codeine, Hydrocodone(Norco, Vicodin), Oxycodone(Percocet, oxycontin) and hydromorphone amongst others.  Long term and even short term use of opiods can cause: Increased pain response Dependence Constipation Depression Respiratory depression And more.  Withdrawal symptoms can include Flu like symptoms Nausea, vomiting And  more Techniques to manage these symptoms Hydrate well Eat regular healthy meals Stay active Use relaxation techniques(deep breathing, meditating, yoga) Do Not substitute Alcohol to help with tapering If you have been on opioids for less than two weeks and do not have pain than it is ok to stop all together.  Plan to wean off of opioids This plan should start within one week post op of your joint replacement. Maintain the same interval or time between taking each dose and first decrease the dose.  Cut the total daily intake of opioids by one tablet each day Next start to increase the time between doses. The last dose that should be eliminated is the evening dose.      Weight bearing as tolerated   Complete by: As directed       Allergies as of 08/27/2020       Reactions   Codeine Nausea And Vomiting   Not right away after a couple days of taking        Medication List     STOP taking these medications    aspirin EC 81 MG tablet   ibuprofen 200 MG tablet Commonly known as: ADVIL       TAKE these medications    acetaminophen 500 MG tablet Commonly known as: TYLENOL Take 1,000 mg by mouth daily.   azelastine 0.05 % ophthalmic solution Commonly known as: OPTIVAR Place 1 drop into both eyes daily as needed (rag weed).   bisoprolol-hydrochlorothiazide 5-6.25 MG tablet Commonly known as: ZIAC Take 1 tablet by mouth daily.   CALCIUM 500 PO Take 1,000 mg by mouth daily. Chewable   diphenhydrAMINE 25 MG tablet Commonly known as: BENADRYL Take 25 mg by mouth every 6 (six) hours as needed for itching.   fexofenadine 180 MG tablet Commonly known as: ALLEGRA  Take 180 mg by mouth daily as needed for allergies or rhinitis.   fluticasone 50 MCG/ACT nasal spray Commonly known as: FLONASE Place 1 spray into both nostrils daily as needed for allergies or rhinitis.   Klor-Con M20 20 MEQ tablet Generic drug: potassium chloride SA TAKE 1 TABLET BY MOUTH EVERY  DAY What changed: how much to take   OMEGA-3 COMPLEX PO Take 4 capsules by mouth daily. Omega xl   valsartan 160 MG tablet Commonly known as: DIOVAN Take 160 mg by mouth daily.   Vitamin B-12 2500 MCG Subl Place 2,500 mcg under the tongue daily.   VITAMIN C PO Take 4 tablets by mouth daily. Chewable   VITAMIN D3 PO Take 1 capsule by mouth daily.   VITAMIN K2 PO Take 1 capsule by mouth daily. MK7   zinc gluconate 50 MG tablet Take 50 mg by mouth daily.   Zinc Oxide 40 % Pste Apply 1 application topically daily as needed. What changed: reasons to take this               Discharge Care Instructions  (From admission, onward)           Start     Ordered   08/27/20 0000  Weight bearing as tolerated        08/27/20 1438            Follow-up Information     Renette Butters, MD. Go on 09/10/2020.   Specialty: Orthopedic Surgery Why: Your appointment has been scheduled for 4:00. Contact information: [address] [place] [address] [phone]         [facility] OPPT -[facility]. Go on 08/28/2020.   Why: Your outpatient physical therapy appointment is scheduled for 9:00. Please arrive a few minutes early to complete paperwork Contact information: [address]  [place], [place]                Signed: Alisa Graff 08/27/2020, 2:38 PM

## 2020-08-27 NOTE — Progress Notes (Signed)
Physical Therapy Treatment Patient Details Name: Sarah Walker MRN: [id number] DOB: [birth year] Today's Date: 08/27/2020    History of Present Illness Patient is 70 y.o. female s/p Rt THA anterior appraoch on 08/26/20 with PMH significant for OA, HTN, ovarian cnacer, neuropathy.    PT Comments    Patient continues to improve mobility with good recall for safe use of RW for transfers and gait. Pt ambulated ~70' with RW and no LOB noted. Completed additional stair training with bil hand rail support to simulate home steps. Hep reviewed and all questions addressed. Pt is at safe level for discharge home with assist from family. Acute PT will continue to progress pt as able during acute stay.    Follow Up Recommendations  Follow surgeon's recommendation for DC plan and follow-up therapies;Home health PT (pt requesting HHPT initially vs OPPT)     Equipment Recommendations  None recommended by PT    Recommendations for Other Services       Precautions / Restrictions Precautions Precautions: Fall Restrictions Weight Bearing Restrictions: No RLE Weight Bearing: Weight bearing as tolerated Other Position/Activity Restrictions: WBAT    Mobility  Bed Mobility               General bed mobility comments: pt OOB in recliner    Transfers Overall transfer level: Needs assistance Equipment used: Rolling walker (2 wheeled) Transfers: Sit to/from Stand Sit to Stand: Min guard         General transfer comment: pt with good recall for bil UE use for power up. guarding for safety to rise from EOB.  Ambulation/Gait Ambulation/Gait assistance: Min guard;Supervision Gait Distance (Feet): 70 Feet Assistive device: Rolling walker (2 wheeled) Gait Pattern/deviations: Step-through pattern;Decreased stride length;Decreased weight shift to right Gait velocity: decr   General Gait Details: pt with good recall for safe walker management and no overt LOB noted  throughout.   Stairs Stairs: Yes Stairs assistance: Min guard Stair Management: Two rails;Step to pattern;Forwards Number of Stairs: 6 (2x3) General stair comments: cues for step to pattern "up with good down with bad" pt maintained balance with safe step pattern. verbalized understanding of safe guarding position for family to provide with stair mobility.   Wheelchair Mobility    Modified Rankin (Stroke Patients Only)       Balance Overall balance assessment: Needs assistance Sitting-balance support: Feet supported Sitting balance-Leahy Scale: Good     Standing balance support: During functional activity;Bilateral upper extremity supported Standing balance-Leahy Scale: Fair                              Cognition Arousal/Alertness: Awake/alert Behavior During Therapy: WFL for tasks assessed/performed Overall Cognitive Status: Within Functional Limits for tasks assessed                                        Exercises Total Joint Exercises Ankle Circles/Pumps: AROM;Both;20 reps;Seated Quad Sets: AROM;Other reps (comment);Right;Seated Short Arc Quad: AROM;Other reps (comment);Right;Seated Heel Slides: AROM;Other reps (comment);Right;Seated Hip ABduction/ADduction: AROM;Other reps (comment);Right;Seated Long [employer]: AROM;Other reps (comment);Right;Seated    General Comments        Pertinent Vitals/Pain Pain Assessment: Faces Faces Pain Scale: Hurts little more Pain Location: Rt hip Pain Descriptors / Indicators: Aching;Discomfort Pain Intervention(s): Limited activity within patient's tolerance;Monitored during session;Repositioned    Home Living  Prior Function            PT Goals (current goals can now be found in the care plan section) Acute Rehab PT Goals Patient Stated Goal: regain independence and be able to go shopping and do acitvities without pain PT Goal Formulation: With patient Time  For Goal Achievement: 09/02/20 Potential to Achieve Goals: Good Progress towards PT goals: Progressing toward goals    Frequency    7X/week      PT Plan      Co-evaluation              AM-PAC PT "6 Clicks" Mobility   Outcome Measure  Help needed turning from your back to your side while in a flat bed without using bedrails?: A Little Help needed moving from lying on your back to sitting on the side of a flat bed without using bedrails?: A Little Help needed moving to and from a bed to a chair (including a wheelchair)?: A Little Help needed standing up from a chair using your arms (e.g., wheelchair or bedside chair)?: A Little Help needed to walk in hospital room?: A Little Help needed climbing 3-5 steps with a railing? : A Little 6 Click Score: 18    End of Session Equipment Utilized During Treatment: Gait belt Activity Tolerance: Patient tolerated treatment well Patient left: in chair;with call bell/phone within reach;with chair alarm set Nurse Communication: Mobility status PT Visit Diagnosis: Muscle weakness (generalized) (M62.81);Difficulty in walking, not elsewhere classified (R26.2)     Time: [id number] PT Time Calculation (min) (ACUTE ONLY): 19 min  Charges:  $Gait Training: 8-22 mins                     Verner Mould, DPT Acute Rehabilitation Services Office [phone] Pager [phone]    Jacques Navy 08/27/2020, 2:06 PM

## 2020-08-27 NOTE — Evaluation (Signed)
Occupational Therapy Evaluation Patient Details Name: Sarah Walker MRN: [id number] DOB: [birth year] Today's Date: 08/27/2020    History of Present Illness Patient is 70 y.o. female s/p Rt THA anterior appraoch on 08/26/20 with PMH significant for OA, HTN, ovarian cnacer, neuropathy.   Clinical Impression   Patient lives with spouse in multi level home. Mod I with self care using AE and with ambulation using walker due to R hip pain. Patient able todemo use of AE sock aid to don socks and how to use gait belt as leg lifter for out of bed. Patient primarily needing assist to power up to standing from edge of bed with cues for sequencing and use of momentum needing up to mod A to stand. Once balanced light min A for safety to transfer to recliner chair. Patient reports having all DME needs at home including 3 in 1 for over the toilet. Anticipate patient will progress well with mobility with PT and does not have any further self care needs at this time with plan to D/C home today. OT to sign off, please re-consult if new needs arise.     Follow Up Recommendations  No OT follow up;Supervision/Assistance - 24 hour (at least initial)    Equipment Recommendations  None recommended by OT (has all DME needs)       Precautions / Restrictions Precautions Precautions: Fall Restrictions Other Position/Activity Restrictions: WBAT      Mobility Bed Mobility Overal bed mobility: Needs Assistance Bed Mobility: Supine to Sit     Supine to sit: Supervision;HOB elevated     General bed mobility comments: patient having difficulty mobilizing R LE to edge of bed. demonstrate how to use gait belt as leg lifter and able able to return demo as well as push with elbows to sit upright to edge of bed without physical assistance    Transfers Overall transfer level: Needs assistance Equipment used: Rolling walker (2 wheeled) Transfers: Sit to/from [facility] Sit to Stand: Mod assist Stand  pivot transfers: Min assist       General transfer comment: cues for sequencing to push from bed and use momentum. needed 3 attempts and mod A to power up to standing, once balance obtained light min A to transfer to recliner chair.    Balance Overall balance assessment: Needs assistance Sitting-balance support: Feet supported Sitting balance-Leahy Scale: Good     Standing balance support: During functional activity;Bilateral upper extremity supported Standing balance-Leahy Scale: Poor                             ADL either performed or assessed with clinical judgement   ADL Overall ADL's : Needs assistance/impaired Eating/Feeding: Independent   Grooming: Set up;Sitting   Upper Body Bathing: Set up;Sitting   Lower Body Bathing: Set up;With adaptive equipment;Sitting/lateral leans   Upper Body Dressing : Set up;Sitting   Lower Body Dressing: Set up;Min guard;Sitting/lateral leans;Sit to/from stand;With adaptive equipment Lower Body Dressing Details (indicate cue type and reason): patient able to demonstrate use of sock aid in order to don socks seated at edge of bed. Toilet Transfer: Moderate assistance;Cueing for safety;Cueing for sequencing;RW;Stand-pivot;Minimal assistance Toilet Transfer Details (indicate cue type and reason): patient needing cues for sequencing body mechanics, 3 attempts and use of momentum with mod A to power up to standing due to R hip pain. Once up, able to maintain balance and transfer to recliner chair with light min A for safety.  Toileting- Water quality scientist and Hygiene: Min guard;Sit to/from stand       Functional mobility during ADLs: Min guard;Minimal assistance;Cueing for safety;Cueing for sequencing;Rolling walker                           Pertinent Vitals/Pain Pain Assessment: Faces Faces Pain Scale: Hurts even more Pain Location: R hip with standing Pain Descriptors / Indicators: Aching;Discomfort Pain  Intervention(s): Premedicated before session;Patient requesting pain meds-RN notified     Hand Dominance Right   Extremity/Trunk Assessment Upper Extremity Assessment Upper Extremity Assessment: Overall WFL for tasks assessed   Lower Extremity Assessment Lower Extremity Assessment: Defer to PT evaluation   Cervical / Trunk Assessment Cervical / Trunk Assessment: Normal   Communication Communication Communication: No difficulties   Cognition Arousal/Alertness: Awake/alert Behavior During Therapy: WFL for tasks assessed/performed Overall Cognitive Status: Within Functional Limits for tasks assessed                                     General Comments  VSS on room air            Home Living Family/patient expects to be discharged to:: Private residence Living Arrangements: Spouse/significant other Available Help at Discharge: Family Type of Home: House Home Access: Stairs to enter Technical brewer of Steps: 1+1 Entrance Stairs-Rails: None Home Layout: Multi-level Alternate Level Stairs-Number of Steps: 4 up to the bedroom Alternate Level Stairs-Rails: Can reach both Bathroom Shower/Tub: Occupational psychologist: Standard Bathroom Accessibility: Yes   Home Equipment: Grab bars - tub/shower;Walker - 2 wheels;Cane - single point;Bedside commode;Shower seat;Adaptive equipment Adaptive Equipment: Reacher;Sock aid;Long-handled shoe horn;Long-handled sponge Additional Comments: has multiple reachers      Prior Functioning/Environment Level of Independence: Independent with assistive device(s)  Gait / Transfers Assistance Needed: has been using walker since January due to hip pain              OT Problem List: Pain;Obesity;Decreased activity tolerance;Impaired balance (sitting and/or standing)         OT Goals(Current goals can be found in the care plan section) Acute Rehab OT Goals Patient Stated Goal: regain independence and be  able to go shopping and do acitvities without pain OT Goal Formulation: All assessment and education complete, DC therapy   AM-PAC OT "6 Clicks" Daily Activity     Outcome Measure Help from another person eating meals?: None Help from another person taking care of personal grooming?: A Little Help from another person toileting, which includes using toliet, bedpan, or urinal?: A Lot Help from another person bathing (including washing, rinsing, drying)?: A Little Help from another person to put on and taking off regular upper body clothing?: A Little Help from another person to put on and taking off regular lower body clothing?: A Little 6 Click Score: 18   End of Session Equipment Utilized During Treatment: Rolling walker;Gait belt Nurse Communication: Mobility status;Patient requests pain meds  Activity Tolerance: Patient tolerated treatment well Patient left: in chair;with call bell/phone within reach;with nursing/sitter in room  OT Visit Diagnosis: Pain Pain - Right/Left: Right Pain - part of body: Hip                Time: [id number] OT Time Calculation (min): 25 min Charges:  OT General Charges $OT Visit: 1 Visit OT Evaluation $OT Eval Low Complexity: 1 Low OT Treatments $Self Care/Home  Management : 8-22 mins  Delbert Phenix OT OT pager: [place] 08/27/2020, 9:44 AM

## 2020-08-27 NOTE — Progress Notes (Signed)
    Subjective: Patient reports pain as mild to moderate. Well controlled with medicine. Tolerating diet. Urinating. No CP, SOB.  Has mobilized with PT/OT OOB. Eager to go home.  Objective:   VITALS:   Vitals:   08/27/20 0448 08/27/20 0941 08/27/20 1200 08/27/20 1313  BP: 103/65 97/62 106/60 125/72  Pulse: 64 62 63 64  Resp: '18 16 17 14  '$ Temp: 98.4 F (36.9 C) 97.7 F (36.5 C) 98.3 F (36.8 C) 98 F (36.7 C)  TempSrc: Oral Oral Oral Oral  SpO2: 99% 99% 100% 100%  Weight:      Height:       CBC Latest Ref Rng & Units 08/13/2020 11/15/2019 11/17/2018  WBC 4.0 - 10.5 K/uL 5.0 4.5 4.7  Hemoglobin 12.0 - 15.0 g/dL 12.8 12.3 13.6  Hematocrit 36.0 - 46.0 % 39.6 38.9 42.5  Platelets 150 - 400 K/uL 158 172 199   BMP Latest Ref Rng & Units 08/13/2020 11/15/2019 11/17/2018  Glucose 70 - 99 mg/dL 101(H) 93 102(H)  BUN 8 - 23 mg/dL 29(H) 26(H) 22  Creatinine 0.44 - 1.00 mg/dL 0.89 0.99 0.80  Sodium 135 - 145 mmol/L 139 138 140  Potassium 3.5 - 5.1 mmol/L 4.8 5.3(H) 4.4  Chloride 98 - 111 mmol/L 104 105 106  CO2 22 - 32 mmol/L '26 24 24  '$ Calcium 8.9 - 10.3 mg/dL 9.5 8.9 9.5   Intake/Output      08/23 0701 08/24 0700 08/24 0701 08/25 0700   P.O. 840 120   I.V. (mL/kg) 1500 (16.6)    IV Piggyback 200.9    Total Intake(mL/kg) 2540.9 (28.1) 120 (1.3)   Urine (mL/kg/hr) 1800 (0.8) 100 (0.2)   Blood 200    Total Output 2000 100   Net +540.9 +20           Physical Exam: General: NAD. Sitting in bedside chair relaxing Resp: No increased wob Cardio: regular rate and rhythm ABD soft Neurologically intact MSK Neurovascularly intact Sensation intact distally Intact pulses distally Dorsiflexion/Plantar flexion intact Incision: dressing C/D/I   Assessment: 1 Day Post-Op  S/P Procedure(s) (LRB): TOTAL HIP ARTHROPLASTY ANTERIOR APPROACH (Right) by Dr. Ernesta Amble. Percell Miller on 08/26/20  Active Problems:   S/P total right hip arthroplasty   Plan:  Advance diet Up with  therapy Incentive Spirometry Elevate and Apply ice  Weightbearing: WBAT RLE Insicional and dressing care: Dressings left intact until follow-up and Reinforce dressings as needed Orthopedic device(s): None Showering: Keep dressing dry VTE prophylaxis: Xarelto '10mg'$   for 30 days post op , SCDs, ambulation Pain control: Continue current regimen.  Follow - up plan: 2 weeks Contact information:  Edmonia Lynch MD, Aggie Moats PA-C  Dispo: Home later today once cleared by PT. Has OPPT set up already.    Britt Bottom, PA-C Office [phone] 08/27/2020, 1:14 PM

## 2020-08-28 ENCOUNTER — Other Ambulatory Visit: Payer: Self-pay

## 2020-08-28 ENCOUNTER — Encounter (HOSPITAL_COMMUNITY): Payer: Self-pay | Admitting: Physical Therapy

## 2020-08-28 ENCOUNTER — Ambulatory Visit (HOSPITAL_COMMUNITY): Payer: Medicare Other | Attending: Orthopedic Surgery | Admitting: Physical Therapy

## 2020-08-28 DIAGNOSIS — M25551 Pain in right hip: Secondary | ICD-10-CM

## 2020-08-28 DIAGNOSIS — M6281 Muscle weakness (generalized): Secondary | ICD-10-CM | POA: Diagnosis not present

## 2020-08-28 DIAGNOSIS — R2689 Other abnormalities of gait and mobility: Secondary | ICD-10-CM | POA: Diagnosis not present

## 2020-08-28 DIAGNOSIS — R29898 Other symptoms and signs involving the musculoskeletal system: Secondary | ICD-10-CM

## 2020-08-28 NOTE — Therapy (Signed)
[facility], [place], [address] Phone: [phone]   Fax:  [phone]  Physical Therapy Evaluation  Patient Details  Name: Sarah Walker MRN: [id number] Date of Birth: 04/17/1950 Referring Provider (PT): Edmonia Lynch MD   Encounter Date: 08/28/2020   PT End of Session - 08/28/20 0950     Visit Number 1    Number of Visits 16    Date for PT Re-Evaluation 10/23/20    Authorization Type [employer] medicare [facility] (no VL, no auth)    Progress Note Due on Visit 10    PT Start Time 0927    PT Stop Time 0954    PT Time Calculation (min) 27 min    Activity Tolerance Patient tolerated treatment well    Behavior During Therapy [facility] for tasks assessed/performed             Past Medical History:  Diagnosis Date   Abdominal hernia    Allergic rhinitis    Arthritis    Bone spur of other site    left foot   Dry eyes    Family history of kidney cancer    Family history of prostate cancer    Hypertension    Ovarian cancer ([place])    Ovarian cancer on right ([place]) 06/17/2011   Peripheral neuropathy 08/26/2011   Chemotherapy-induced   Pulmonary embolism ([place])    Sinusitis     Past Surgical History:  Procedure Laterality Date   ABDOMINAL HYSTERECTOMY  05/25/2011   [place] bs&o and cancer staging   CHOLECYSTECTOMY  1990   COLONOSCOPY N/A 09/06/2014   Procedure: COLONOSCOPY;  Surgeon: Rogene Houston, MD;  Location: [facility];  Service: Endoscopy;  Laterality: N/A;  930 - moved to 9/2 @ 8:25   [place]  10/15/2014   INCISIONAL HERNIA REPAIR  10/15/2014   Procedure: OPEN INCISIONAL HERNIA REPAIR WITH MESH AND MYOFASCIAL FLAPS;  Surgeon: Erroll Luna, MD;  Location: [facility];  Service: General;;   IVC filter  05/24/11   PORT-A-CATH REMOVAL Right 11/21/2013   Procedure: MINOR REMOVAL PORT-A-CATH;  Surgeon: Jamesetta So, MD;  Location: [facility];  Service: General;  Laterality: Right;   PORTACATH  PLACEMENT  06/16/11   Done at [facility]     age 43's   [place]    There were no vitals filed for this visit.    Subjective Assessment - 08/28/20 0931     Subjective Patient is a 70 y.o. female who presents to physical therapy s/p R anterior THA on 08/26/20. Patient states she was late because it took her a while to walk to the car. She was given exercises at hospital which she has not really started. She took 2 Tylenol before she came. No other issues since therapy. Patient states her main goal is to get well.    Limitations Lifting;Standing;Walking;House hold activities    Patient Stated Goals to get well    Currently in Pain? Yes    Pain Score 3     Pain Location Hip    Pain Orientation Right    Pain Descriptors / Indicators Aching    Pain Type Surgical pain    Pain Onset In the past 7 days    Pain Frequency Constant                [facility] PT Assessment - 08/28/20 0001       Assessment  Medical Diagnosis R anterior THA    Referring Provider (PT) Edmonia Lynch MD    Onset Date/Surgical Date 08/26/20    Next MD Visit 2 weeks    Prior Therapy yes shoulder      Precautions   Precautions None    Precaution Comments anterior hip - was not told precautions      Restrictions   Weight Bearing Restrictions No      Balance Screen   Has the patient fallen in the past 6 months No    Has the patient had a decrease in activity level because of a fear of falling?  No    Is the patient reluctant to leave their home because of a fear of falling?  No      Prior Function   Level of Independence Independent    Vocation Retired      Charity fundraiser Status Within Functional Limits for tasks assessed      Observation/Other Assessments   Observations Ambulates with RW, ted hose    Focus on Therapeutic Outcomes (FOTO)  not uploaded - complete next session      ROM / Strength   AROM / PROM / Strength AROM;Strength      AROM    Overall AROM  Deficits;Due to pain      Strength   Strength Assessment Site Hip;Knee;Ankle    Right/Left Hip Right;Left    Right Hip Flexion 2+/5    Left Hip Flexion 4+/5    Right/Left Knee Right;Left    Right Knee Flexion 4+/5    Right Knee Extension 3-/5    Left Knee Flexion 5/5    Left Knee Extension 5/5    Right/Left Ankle Right;Left    Right Ankle Dorsiflexion 5/5    Left Ankle Dorsiflexion 5/5      Transfers   Comments slow, labored, from elevated table      Ambulation/Gait   Ambulation/Gait Yes    Ambulation/Gait Assistance 6: Modified independent (Device/Increase time)    Ambulation Distance (Feet) 120 Feet    Assistive device Rolling walker    Gait Pattern Antalgic    Ambulation Surface Level;Indoor    Gait Comments slow, labored cadence                        Objective measurements completed on examination: See above findings.               PT Education - 08/28/20 [phone]     Education Details Patient educated on exam findings, POC, scope of PT, performing HEP given at hospital, review of HEP, bed mobility    Person(s) Educated Patient    Methods Explanation;Demonstration    Comprehension Verbalized understanding;Returned demonstration              PT Short Term Goals - 08/28/20 0959       PT SHORT TERM GOAL #1   Title Patient will be independent with HEP in order to improve functional outcomes.    Time 4    Period Weeks    Status New    Target Date 09/25/20      PT SHORT TERM GOAL #2   Title Patient will report at least 25% improvement in symptoms for improved quality of life.    Time 4    Period Weeks    Status New    Target Date 09/25/20  PT Long Term Goals - 08/28/20 0959       PT LONG TERM GOAL #1   Title Patient will report at least 75% improvement in symptoms for improved quality of life.    Time 8    Period Weeks    Status New    Target Date 10/23/20      PT LONG TERM GOAL #2   Title  Patient will improve FOTO score by at least 25 points in order to indicate improved tolerance to activity.    Time 8    Period Weeks    Status New    Target Date 10/23/20      PT LONG TERM GOAL #3   Title Patient will be able to complete 5x STS in under 11.4 seconds in order to reduce the risk of falls.    Time 8    Period Weeks    Status New    Target Date 10/23/20      PT LONG TERM GOAL #4   Title Patient will be able to navigate stairs with reciprocal pattern without compensation in order to demonstrate improved LE strength.    Time 8    Period Weeks    Status New    Target Date 10/23/20                    Plan - 08/28/20 0956     Clinical Impression Statement Patient is a 70 y.o. female who presents to physical therapy s/p R anterior THA on 08/26/20. She presents with pain limited deficits in R hip strength, ROM, endurance, gait, balance, transfers, and functional mobility with ADL. She is having to modify and restrict ADL as indicated by FOTO score as well as subjective information and objective measures which is affecting overall participation. Patient will benefit from skilled physical therapy in order to improve function and reduce impairment.    Personal Factors and Comorbidities Age;Fitness;Comorbidity 1    Comorbidities neuropathy    Examination-Activity Limitations Locomotion Level;Transfers;Bed Mobility;Bend;Squat;Stairs;Stand;Lift;Dressing;Hygiene/Grooming    Examination-Participation Restrictions Meal Prep;Cleaning;Shop;Volunteer;Yard Work;Laundry;Driving    Stability/Clinical Decision Making Stable/Uncomplicated    Clinical Decision Making Low    Rehab Potential Good    PT Frequency 2x / week    PT Duration 8 weeks    PT Treatment/Interventions ADLs/Self Care Home Management;Aquatic Therapy;Electrical Stimulation;Iontophoresis '4mg'$ /ml Dexamethasone;Traction;Moist Heat;Ultrasound;DME Instruction;Gait training;Stair training;Functional mobility  training;Therapeutic activities;Therapeutic exercise;Balance training;Neuromuscular re-education;Patient/family education;Manual techniques;Manual lymph drainage;Orthotic Fit/Training;Compression bandaging;Scar mobilization;Passive range of motion;Dry needling;Energy conservation;Splinting;Taping    PT Next Visit Plan get FOTO uploaded and complete, hip strengthening, gait and balance training, begin functional strength as able    PT Home Exercise Plan exercises given at hospital    Consulted and Agree with Plan of Care Patient             Patient will benefit from skilled therapeutic intervention in order to improve the following deficits and impairments:  Abnormal gait, Difficulty walking, Decreased range of motion, Increased muscle spasms, Pain, Decreased activity tolerance, Decreased balance, Impaired flexibility, Improper body mechanics, Decreased mobility, Decreased strength  Visit Diagnosis: Pain in right hip  Muscle weakness (generalized)  Other abnormalities of gait and mobility  Other symptoms and signs involving the musculoskeletal system     Problem List Patient Active Problem List   Diagnosis Date Noted   S/P total right hip arthroplasty 08/26/2020   Vaginal Pap smear following hysterectomy for malignancy 03/02/2017   Screening for colorectal cancer 03/02/2017   Vaginal atrophy 03/02/2017   History  of ovarian cancer 03/02/2017   Genetic testing 05/29/2015   Family history of kidney cancer    Family history of prostate cancer    Chemotherapy-induced neuropathy ([place]) 03/31/2015   Incisional hernia, incarcerated 10/15/2014   Ventral hernia 07/15/2014   Ovarian cancer on right [facility]) 06/17/2011   Hypertension 06/17/2011   H/O hysterectomy with oophorectomy 06/17/2011    10:01 AM, 08/28/20 Mearl Latin PT, DPT Physical Therapist at [facility] [place],  [place], [address] Phone: [phone]   Fax:  [phone]  Name: SEKINA WURTS MRN: [id number] Date of Birth: 1950/12/04

## 2020-09-01 ENCOUNTER — Encounter (HOSPITAL_COMMUNITY): Payer: Self-pay | Admitting: Physical Therapy

## 2020-09-01 ENCOUNTER — Ambulatory Visit (HOSPITAL_COMMUNITY): Payer: Medicare Other | Admitting: Physical Therapy

## 2020-09-01 ENCOUNTER — Other Ambulatory Visit: Payer: Self-pay

## 2020-09-01 DIAGNOSIS — M25551 Pain in right hip: Secondary | ICD-10-CM

## 2020-09-01 DIAGNOSIS — R29898 Other symptoms and signs involving the musculoskeletal system: Secondary | ICD-10-CM | POA: Diagnosis not present

## 2020-09-01 DIAGNOSIS — R2689 Other abnormalities of gait and mobility: Secondary | ICD-10-CM | POA: Diagnosis not present

## 2020-09-01 DIAGNOSIS — M6281 Muscle weakness (generalized): Secondary | ICD-10-CM

## 2020-09-01 NOTE — Therapy (Signed)
[place], [place], [address] Phone: [phone]   Fax:  [phone]  Physical Therapy Treatment  Patient Details  Name: Sarah Walker MRN: [id number] Date of Birth: [birth year] Referring Provider (PT): Edmonia Lynch MD   Encounter Date: 09/01/2020   PT End of Session - 09/01/20 0832     Visit Number 2    Number of Visits 16    Date for PT Re-Evaluation 10/23/20    Authorization Type [employer] medicare [facility] (no VL, no auth)    Progress Note Due on Visit 10    PT Start Time [phone]    PT Stop Time 0910    PT Time Calculation (min) 38 min    Activity Tolerance Patient tolerated treatment well    Behavior During Therapy [facility] for tasks assessed/performed             Past Medical History:  Diagnosis Date   Abdominal hernia    Allergic rhinitis    Arthritis    Bone spur of other site    left foot   Dry eyes    Family history of kidney cancer    Family history of prostate cancer    Hypertension    Ovarian cancer ([place])    Ovarian cancer on right ([place]) 06/17/2011   Peripheral neuropathy 08/26/2011   Chemotherapy-induced   Pulmonary embolism ([place])    Sinusitis     Past Surgical History:  Procedure Laterality Date   ABDOMINAL HYSTERECTOMY  05/25/2011   [place] bs&o and cancer staging   CHOLECYSTECTOMY  1990   COLONOSCOPY N/A 09/06/2014   Procedure: COLONOSCOPY;  Surgeon: Rogene Houston, MD;  [facility];  Service: Endoscopy;  Laterality: N/A;  930 - moved to 9/2 @ 8:25   [place]  10/15/2014   INCISIONAL HERNIA REPAIR  10/15/2014   Procedure: OPEN INCISIONAL HERNIA REPAIR WITH MESH AND MYOFASCIAL FLAPS;  Surgeon: Erroll Luna, MD;  Location: [place];  Service: General;;   IVC filter  05/24/11   PORT-A-CATH REMOVAL Right 11/21/2013   Procedure: MINOR REMOVAL PORT-A-CATH;  Surgeon: Jamesetta So, MD;  Location: [facility];  Service: General;  Laterality: Right;   PORTACATH  PLACEMENT  06/16/11   Done at [place]     age 70's   TOTAL HIP ARTHROPLASTY Right 08/26/2020   Procedure: TOTAL HIP ARTHROPLASTY ANTERIOR APPROACH;  Surgeon: Renette Butters, MD;  Location: [facility];  Service: Orthopedics;  Laterality: Right;   TUBAL LIGATION  1981    There were no vitals filed for this visit.   Subjective Assessment - 09/01/20 0833     Subjective Patient states her hip has been painful. She has been walking some. She has not done her home exercises other than her ankle pumps because her hip has been sore.    Limitations Lifting;Standing;Walking;House hold activities    Patient Stated Goals to get well    Currently in Pain? Yes    Pain Score 2     Pain Location Hip    Pain Orientation Right    Pain Descriptors / Indicators Burning;Aching;Pressure    Pain Type Surgical pain    Pain Onset In the past 7 days    Pain Frequency Constant                [facility] PT Assessment - 09/01/20 0001       Observation/Other Assessments   Focus on Therapeutic  Outcomes (FOTO)  23% function                           [facility] Adult PT Treatment/Exercise - 09/01/20 0001       Exercises   Exercises Knee/Hip      Knee/Hip Exercises: Seated   Long Arc Quad Right;10 reps    Long Arc Quad Limitations 5 second    Other Seated Knee/Hip Exercises ankle pumps x 20      Knee/Hip Exercises: Supine   Quad Sets 10 reps    Quad Sets Limitations 10 second holds    Short Arc [employer] Right;10 reps    Short Arc Quad Sets Limitations 5 second    Heel Slides AAROM;Right;10 reps    Bridges Both;10 reps    Bridges Limitations 2 sets    Other Supine Knee/Hip Exercises hip add/abd isometrics with belt and ball    Other Supine Knee/Hip Exercises glute set 10 x 10 second holds                    PT Education - 09/01/20 0833     Education Details HEP    Person(s) Educated Patient    Methods Explanation    Comprehension Verbalized  understanding              PT Short Term Goals - 08/28/20 0959       PT SHORT TERM GOAL #1   Title Patient will be independent with HEP in order to improve functional outcomes.    Time 4    Period Weeks    Status New    Target Date 09/25/20      PT SHORT TERM GOAL #2   Title Patient will report at least 25% improvement in symptoms for improved quality of life.    Time 4    Period Weeks    Status New    Target Date 09/25/20               PT Long Term Goals - 08/28/20 0959       PT LONG TERM GOAL #1   Title Patient will report at least 75% improvement in symptoms for improved quality of life.    Time 8    Period Weeks    Status New    Target Date 10/23/20      PT LONG TERM GOAL #2   Title Patient will improve FOTO score by at least 25 points in order to indicate improved tolerance to activity.    Time 8    Period Weeks    Status New    Target Date 10/23/20      PT LONG TERM GOAL #3   Title Patient will be able to complete 5x STS in under 11.4 seconds in order to reduce the risk of falls.    Time 8    Period Weeks    Status New    Target Date 10/23/20      PT LONG TERM GOAL #4   Title Patient will be able to navigate stairs with reciprocal pattern without compensation in order to demonstrate improved LE strength.    Time 8    Period Weeks    Status New    Target Date 10/23/20                   Plan - 09/01/20 [id number]     Clinical Impression Statement Patient with c/o pain  with most exercises completed. Began with LE and core strengthening exercises and patient is given intermittent cueing for mechanics/positioning. Patient unable to complete heel slides secondary to weakness with AROM but is able to complete with PT assist. Completed FOTO which reveals 23% function. Patient educated on importance of compliance with HEP. Patient will continue to benefit from skilled physical therapy in order to reduce impairment and improve function.    Personal  Factors and Comorbidities Age;Fitness;Comorbidity 1    Comorbidities neuropathy    Examination-Activity Limitations Locomotion Level;Transfers;Bed Mobility;Bend;Squat;Stairs;Stand;Lift;Dressing;Hygiene/Grooming    Examination-Participation Restrictions Meal Prep;Cleaning;Shop;Volunteer;Yard Work;Laundry;Driving    Stability/Clinical Decision Making Stable/Uncomplicated    Rehab Potential Good    PT Frequency 2x / week    PT Duration 8 weeks    PT Treatment/Interventions ADLs/Self Care Home Management;Aquatic Therapy;Electrical Stimulation;Iontophoresis '4mg'$ /ml Dexamethasone;Traction;Moist Heat;Ultrasound;DME Instruction;Gait training;Stair training;Functional mobility training;Therapeutic activities;Therapeutic exercise;Balance training;Neuromuscular re-education;Patient/family education;Manual techniques;Manual lymph drainage;Orthotic Fit/Training;Compression bandaging;Scar mobilization;Passive range of motion;Dry needling;Energy conservation;Splinting;Taping    PT Next Visit Plan hip strengthening, gait and balance training, begin functional strength as able    PT Home Exercise Plan exercises given at hospital    Consulted and Agree with Plan of Care Patient             Patient will benefit from skilled therapeutic intervention in order to improve the following deficits and impairments:  Abnormal gait, Difficulty walking, Decreased range of motion, Increased muscle spasms, Pain, Decreased activity tolerance, Decreased balance, Impaired flexibility, Improper body mechanics, Decreased mobility, Decreased strength  Visit Diagnosis: Pain in right hip  Muscle weakness (generalized)  Other abnormalities of gait and mobility  Other symptoms and signs involving the musculoskeletal system     Problem List Patient Active Problem List   Diagnosis Date Noted   S/P total right hip arthroplasty 08/26/2020   Vaginal Pap smear following hysterectomy for malignancy 03/02/2017   Screening for  colorectal cancer 03/02/2017   Vaginal atrophy 03/02/2017   History of ovarian cancer 03/02/2017   Genetic testing 05/29/2015   Family history of kidney cancer    Family history of prostate cancer    Chemotherapy-induced neuropathy ([place]) 03/31/2015   Incisional hernia, incarcerated 10/15/2014   Ventral hernia 07/15/2014   Ovarian cancer on right ([place]) 06/17/2011   Hypertension 06/17/2011   H/O hysterectomy with oophorectomy 06/17/2011    9:07 AM, 09/01/20 Mearl Latin PT, DPT Physical Therapist at [place], [place], [address] Phone: [phone]   Fax:  [phone]  Name: Sarah Walker MRN: [id number] Date of Birth: 1951/01/04

## 2020-09-02 ENCOUNTER — Encounter (HOSPITAL_COMMUNITY): Payer: Self-pay | Admitting: Orthopedic Surgery

## 2020-09-02 NOTE — Anesthesia Postprocedure Evaluation (Signed)
Anesthesia Post Note  Patient: Sarah Walker  Procedure(s) Performed: TOTAL HIP ARTHROPLASTY ANTERIOR APPROACH (Right: Hip)     Patient location during evaluation: PACU Anesthesia Type: MAC and Spinal Level of consciousness: awake and alert Pain management: pain level controlled Vital Signs Assessment: post-procedure vital signs reviewed and stable Respiratory status: spontaneous breathing, nonlabored ventilation, respiratory function stable and patient connected to nasal cannula oxygen Cardiovascular status: blood pressure returned to baseline and stable Postop Assessment: no apparent nausea or vomiting Anesthetic complications: no   No notable events documented.  Last Vitals:  Vitals:   08/27/20 1313 08/27/20 1510  BP: 125/72 116/70  Pulse: 64 66  Resp: 14   Temp: 36.7 C   SpO2: 100%     Last Pain:  Vitals:   08/27/20 1313  TempSrc: Oral  PainSc:                  Ketzaly Cardella

## 2020-09-03 ENCOUNTER — Encounter (HOSPITAL_COMMUNITY): Payer: Medicare Other | Admitting: Physical Therapy

## 2020-09-05 ENCOUNTER — Encounter (HOSPITAL_COMMUNITY): Payer: Medicare Other | Admitting: Physical Therapy

## 2020-09-09 ENCOUNTER — Encounter (HOSPITAL_COMMUNITY): Payer: Medicare Other | Admitting: Physical Therapy

## 2020-09-10 DIAGNOSIS — M1611 Unilateral primary osteoarthritis, right hip: Secondary | ICD-10-CM | POA: Diagnosis not present

## 2020-09-11 ENCOUNTER — Ambulatory Visit (HOSPITAL_COMMUNITY): Payer: Medicare Other | Attending: Orthopedic Surgery | Admitting: Physical Therapy

## 2020-09-11 ENCOUNTER — Other Ambulatory Visit: Payer: Self-pay

## 2020-09-11 DIAGNOSIS — M25551 Pain in right hip: Secondary | ICD-10-CM | POA: Insufficient documentation

## 2020-09-11 DIAGNOSIS — R29898 Other symptoms and signs involving the musculoskeletal system: Secondary | ICD-10-CM | POA: Diagnosis not present

## 2020-09-11 DIAGNOSIS — R2689 Other abnormalities of gait and mobility: Secondary | ICD-10-CM | POA: Diagnosis not present

## 2020-09-11 DIAGNOSIS — M25572 Pain in left ankle and joints of left foot: Secondary | ICD-10-CM | POA: Diagnosis not present

## 2020-09-11 DIAGNOSIS — M6281 Muscle weakness (generalized): Secondary | ICD-10-CM | POA: Diagnosis not present

## 2020-09-11 NOTE — Therapy (Signed)
[place], [place], [address] Phone: [phone]   Fax:  [phone]  Physical Therapy Treatment  Patient Details  Name: Sarah Walker MRN: [id number] Date of Birth: Sep 26, 1950 Referring Provider (PT): Edmonia Lynch MD   Encounter Date: 09/11/2020   PT End of Session - 09/11/20 1119     Visit Number 3    Number of Visits 16    Date for PT Re-Evaluation 10/23/20    Authorization Type [employer] medicare [facility] (no VL, no auth)    Progress Note Due on Visit 10    PT Start Time 1004    PT Stop Time 1047    PT Time Calculation (min) 43 min    Activity Tolerance Patient tolerated treatment well    Behavior During Therapy [facility] for tasks assessed/performed             Past Medical History:  Diagnosis Date   Abdominal hernia    Allergic rhinitis    Arthritis    Bone spur of other site    left foot   Dry eyes    Family history of kidney cancer    Family history of prostate cancer    Hypertension    Ovarian cancer ([place])    Ovarian cancer on right ([place]) 06/17/2011   Peripheral neuropathy 08/26/2011   Chemotherapy-induced   Pulmonary embolism ([place])    Sinusitis     Past Surgical History:  Procedure Laterality Date   ABDOMINAL HYSTERECTOMY  05/25/2011   [place] bs&o and cancer staging   CHOLECYSTECTOMY  1990   COLONOSCOPY N/A 09/06/2014   Procedure: COLONOSCOPY;  Surgeon: Rogene Houston, MD;  [facility];  Service: Endoscopy;  Laterality: N/A;  930 - moved to 9/2 @ 8:25   [place]  10/15/2014   INCISIONAL HERNIA REPAIR  10/15/2014   Procedure: OPEN INCISIONAL HERNIA REPAIR WITH MESH AND MYOFASCIAL FLAPS;  Surgeon: Erroll Luna, MD;  Location: [place];  Service: General;;   IVC filter  05/24/11   PORT-A-CATH REMOVAL Right 11/21/2013   Procedure: MINOR REMOVAL PORT-A-CATH;  Surgeon: Jamesetta So, MD;  Location[facility];  Service: General;  Laterality: Right;   PORTACATH  PLACEMENT  06/16/11   Done at [place]     age 79's   TOTAL HIP ARTHROPLASTY Right 08/26/2020   Procedure: TOTAL HIP ARTHROPLASTY ANTERIOR APPROACH;  Surgeon: Renette Butters, MD;  [facility];  Service: Orthopedics;  Laterality: Right;   TUBAL LIGATION  1981    There were no vitals filed for this visit.   Subjective Assessment - 09/11/20 1123     Subjective pt states her hip is not hurting as bad but still a little sore.  Comes today with her SPC.    Currently in Pain? No/denies                               [facility] Adult PT Treatment/Exercise - 09/11/20 0001       Ambulation/Gait   Ambulation/Gait Yes    Ambulation/Gait Assistance 6: Modified independent (Device/Increase time)    Ambulation Distance (Feet) 150 Feet    Assistive device Straight cane    Gait Pattern Antalgic    Ambulation Surface Level    Gait Comments slow cadence      Knee/Hip Exercises: Standing   Heel Raises Both;15 reps    Hip  Flexion Both;10 reps    Hip Flexion Limitations standing march with HHA    Lateral Step Up Right;10 reps;Hand Hold: 2;Step Height: 2"    Forward Step Up Right;10 reps;Hand Hold: 1;Step Height: 2"    SLS with Vectors 5X5" with bil UE    Gait Training tandem stance 30" each with intermittent HHA      Knee/Hip Exercises: Seated   Sit to Sand 10 reps;without UE support   with airex in chair                    PT Education - 09/11/20 1118     Education Details goals review, THR precautions, use of SPC at home and compliance with HEP    Person(s) Educated Patient    Methods Explanation    Comprehension Verbalized understanding              PT Short Term Goals - 09/11/20 1023       PT SHORT TERM GOAL #1   Title Patient will be independent with HEP in order to improve functional outcomes.    Time 4    Period Weeks    Status On-going    Target Date 09/25/20      PT SHORT TERM GOAL #2   Title Patient will  report at least 25% improvement in symptoms for improved quality of life.    Time 4    Period Weeks    Status Achieved    Target Date 09/25/20               PT Long Term Goals - 09/11/20 1024       PT LONG TERM GOAL #1   Title Patient will report at least 75% improvement in symptoms for improved quality of life.    Time 8    Period Weeks    Status On-going      PT LONG TERM GOAL #2   Title Patient will improve FOTO score by at least 25 points in order to indicate improved tolerance to activity.    Time 8    Period Weeks    Status On-going      PT LONG TERM GOAL #3   Title Patient will be able to complete 5x STS in under 11.4 seconds in order to reduce the risk of falls.    Time 8    Period Weeks    Status On-going      PT LONG TERM GOAL #4   Title Patient will be able to navigate stairs with reciprocal pattern without compensation in order to demonstrate improved LE strength.    Time 8    Period Weeks    Status On-going                   Plan - 09/11/20 1118     Clinical Impression Statement Reviewed goals/current progress and discussed hip precautions.  Pt unaware of any precautions so requested she discuss with her MD when she returns.  Pt comes today with SPC; therapist adjusted to correct height.  Pt able to demonstrate correct sequencing but with some antalgia noted.  Progressed to standing exercises this session utilizing hip mm.  Pt relied heavily on UE with 4" step ups so reduced to 2" step with less UE assistance needed.  Required elevation from airex in order to complete standing from chair without UE assist due to weakness.  Began static balance in tandem position and addition of vectors to help improve  glute strength.  Pt reported no pain or issues at end of session today.    Personal Factors and Comorbidities Age;Fitness;Comorbidity 1    Comorbidities neuropathy    Examination-Activity Limitations Locomotion Level;Transfers;Bed  Mobility;Bend;Squat;Stairs;Stand;Lift;Dressing;Hygiene/Grooming    Examination-Participation Restrictions Meal Prep;Cleaning;Shop;Volunteer;Yard Work;Laundry;Driving    Stability/Clinical Decision Making Stable/Uncomplicated    Rehab Potential Good    PT Frequency 2x / week    PT Duration 8 weeks    PT Treatment/Interventions ADLs/Self Care Home Management;Aquatic Therapy;Electrical Stimulation;Iontophoresis '4mg'$ /ml Dexamethasone;Traction;Moist Heat;Ultrasound;DME Instruction;Gait training;Stair training;Functional mobility training;Therapeutic activities;Therapeutic exercise;Balance training;Neuromuscular re-education;Patient/family education;Manual techniques;Manual lymph drainage;Orthotic Fit/Training;Compression bandaging;Scar mobilization;Passive range of motion;Dry needling;Energy conservation;Splinting;Taping    PT Next Visit Plan hip strengthening, gait and balance training.  Progress functional strength as able    PT Home Exercise Plan exercises given at hospital    Consulted and Agree with Plan of Care Patient             Patient will benefit from skilled therapeutic intervention in order to improve the following deficits and impairments:  Abnormal gait, Difficulty walking, Decreased range of motion, Increased muscle spasms, Pain, Decreased activity tolerance, Decreased balance, Impaired flexibility, Improper body mechanics, Decreased mobility, Decreased strength  Visit Diagnosis: Pain in right hip  Muscle weakness (generalized)  Other abnormalities of gait and mobility  Other symptoms and signs involving the musculoskeletal system  Pain in left ankle and joints of left foot     Problem List Patient Active Problem List   Diagnosis Date Noted   S/P total right hip arthroplasty 08/26/2020   Vaginal Pap smear following hysterectomy for malignancy 03/02/2017   Screening for colorectal cancer 03/02/2017   Vaginal atrophy 03/02/2017   History of ovarian cancer 03/02/2017    Genetic testing 05/29/2015   Family history of kidney cancer    Family history of prostate cancer    Chemotherapy-induced neuropathy ([place]) 03/31/2015   Incisional hernia, incarcerated 10/15/2014   Ventral hernia 07/15/2014   Ovarian cancer on right [facility]) 06/17/2011   Hypertension 06/17/2011   H/O hysterectomy with oophorectomy 06/17/2011   Teena Irani, PTA/CLT [phone]  Teena Irani, PTA 09/11/2020, 11:24 AM  [place] [address] [place], [place], [address] Phone: [phone]   Fax:  [phone]  Name: AZELLA SPINNEY MRN: [id number] Date of Birth: November 16, 1950

## 2020-09-12 ENCOUNTER — Encounter (HOSPITAL_COMMUNITY): Payer: Medicare Other | Admitting: Physical Therapy

## 2020-09-15 ENCOUNTER — Ambulatory Visit (HOSPITAL_COMMUNITY): Payer: Medicare Other | Admitting: Physical Therapy

## 2020-09-15 ENCOUNTER — Other Ambulatory Visit: Payer: Self-pay

## 2020-09-15 DIAGNOSIS — M6281 Muscle weakness (generalized): Secondary | ICD-10-CM | POA: Diagnosis not present

## 2020-09-15 DIAGNOSIS — M25572 Pain in left ankle and joints of left foot: Secondary | ICD-10-CM | POA: Diagnosis not present

## 2020-09-15 DIAGNOSIS — R2689 Other abnormalities of gait and mobility: Secondary | ICD-10-CM

## 2020-09-15 DIAGNOSIS — M25551 Pain in right hip: Secondary | ICD-10-CM | POA: Diagnosis not present

## 2020-09-15 DIAGNOSIS — R29898 Other symptoms and signs involving the musculoskeletal system: Secondary | ICD-10-CM | POA: Diagnosis not present

## 2020-09-15 NOTE — Therapy (Signed)
[place], [place], [address] Phone: [phone]   Fax:  [phone]  Physical Therapy Treatment  Patient Details  Name: Sarah Walker MRN: [id number] Date of Birth: 1950/10/21 Referring Provider (PT): Edmonia Lynch MD   Encounter Date: 09/15/2020   PT End of Session - 09/15/20 0934     Visit Number 4    Number of Visits 16    Date for PT Re-Evaluation 10/23/20    Authorization Type [employer] medicare [employer] [facility] (no VL, no auth)    Progress Note Due on Visit 10    PT Start Time 0920    PT Stop Time 1000    PT Time Calculation (min) 40 min    Activity Tolerance Patient tolerated treatment well    Behavior During Therapy [facility] for tasks assessed/performed             Past Medical History:  Diagnosis Date   Abdominal hernia    Allergic rhinitis    Arthritis    Bone spur of other site    left foot   Dry eyes    Family history of kidney cancer    Family history of prostate cancer    Hypertension    Ovarian cancer ([place])    Ovarian cancer on right ([place]) 06/17/2011   Peripheral neuropathy 08/26/2011   Chemotherapy-induced   Pulmonary embolism ([place])    Sinusitis     Past Surgical History:  Procedure Laterality Date   ABDOMINAL HYSTERECTOMY  05/25/2011   [place] bs&o and cancer staging   CHOLECYSTECTOMY  1990   COLONOSCOPY N/A 09/06/2014   Procedure: COLONOSCOPY;  Surgeon: Rogene Houston, MD;  [facility];  Service: Endoscopy;  Laterality: N/A;  930 - moved to 9/2 @ 8:25   [place]  10/15/2014   INCISIONAL HERNIA REPAIR  10/15/2014   Procedure: OPEN INCISIONAL HERNIA REPAIR WITH MESH AND MYOFASCIAL FLAPS;  Surgeon: Erroll Luna, MD;  Location: [place];  Service: General;;   IVC filter  05/24/11   PORT-A-CATH REMOVAL Right 11/21/2013   Procedure: MINOR REMOVAL PORT-A-CATH;  Surgeon: Jamesetta So, MD;  [facility];  Service: General;  Laterality: Right;   PORTACATH  PLACEMENT  06/16/11   Done at [place]     age 70's   TOTAL HIP ARTHROPLASTY Right 08/26/2020   Procedure: TOTAL HIP ARTHROPLASTY ANTERIOR APPROACH;  Surgeon: Renette Butters, MD;  [facility];  Service: Orthopedics;  Laterality: Right;   TUBAL LIGATION  1981    There were no vitals filed for this visit.   Subjective Assessment - 09/15/20 0932     Subjective Pt reports no pain just stiffness.  sTates she took a painpill before she came today.  Reports she did not do much over the weekend due to not sleeping well at night.  sTates she did do her HEP.    Currently in Pain? No/denies                               [facility] Adult PT Treatment/Exercise - 09/15/20 0001       Knee/Hip Exercises: Standing   Heel Raises Both;15 reps    Heel Raises Limitations toerasies 15X    Hip Flexion Both;15 reps    Hip Flexion Limitations standing march with HHA    Forward Lunges Right;15 reps    Forward Lunges Limitations  onto 2" step no UE asisst working on stability    Hip Abduction Both;15 reps    Lateral Step Up Hand Hold: 2;Step Height: 2";15 reps;Both    Forward Step Up Hand Hold: 1;Step Height: 2";Both;15 reps    Step Down Hand Hold: 2;15 reps;Both;Step Height: 2"                       PT Short Term Goals - 09/11/20 1023       PT SHORT TERM GOAL #1   Title Patient will be independent with HEP in order to improve functional outcomes.    Time 4    Period Weeks    Status On-going    Target Date 09/25/20      PT SHORT TERM GOAL #2   Title Patient will report at least 25% improvement in symptoms for improved quality of life.    Time 4    Period Weeks    Status Achieved    Target Date 09/25/20               PT Long Term Goals - 09/11/20 1024       PT LONG TERM GOAL #1   Title Patient will report at least 75% improvement in symptoms for improved quality of life.    Time 8    Period Weeks    Status On-going       PT LONG TERM GOAL #2   Title Patient will improve FOTO score by at least 25 points in order to indicate improved tolerance to activity.    Time 8    Period Weeks    Status On-going      PT LONG TERM GOAL #3   Title Patient will be able to complete 5x STS in under 11.4 seconds in order to reduce the risk of falls.    Time 8    Period Weeks    Status On-going      PT LONG TERM GOAL #4   Title Patient will be able to navigate stairs with reciprocal pattern without compensation in order to demonstrate improved LE strength.    Time 8    Period Weeks    Status On-going                   Plan - 09/15/20 1002     Clinical Impression Statement Pt with noted stiffness and difficulty rising from chair in waiting room; admits to sleeping a lot over the weekend and not being active.  Began with ambulation using SPC with slow cadence but no cues needed for sequencing.  Toeraises added with noted weakness in dorsiflexion.   Pt tends to rock forward rather than push up with heelraises.  Encouraged decrease use of UE's as depends on these a lot, especially with Rt LE activities.  Added lunges to help improve LE stability.  Forward step downs also added today . Able to increase all repetitions to 15 today as well.    Personal Factors and Comorbidities Age;Fitness;Comorbidity 1    Comorbidities neuropathy    Examination-Activity Limitations Locomotion Level;Transfers;Bed Mobility;Bend;Squat;Stairs;Stand;Lift;Dressing;Hygiene/Grooming    Examination-Participation Restrictions Meal Prep;Cleaning;Shop;Volunteer;Yard Work;Laundry;Driving    Stability/Clinical Decision Making Stable/Uncomplicated    Rehab Potential Good    PT Frequency 2x / week    PT Duration 8 weeks    PT Treatment/Interventions ADLs/Self Care Home Management;Aquatic Therapy;Electrical Stimulation;Iontophoresis '4mg'$ /ml Dexamethasone;Traction;Moist Heat;Ultrasound;DME Instruction;Gait training;Stair training;Functional mobility  training;Therapeutic activities;Therapeutic exercise;Balance training;Neuromuscular re-education;Patient/family education;Manual techniques;Manual lymph drainage;Orthotic Fit/Training;Compression  bandaging;Scar mobilization;Passive range of motion;Dry needling;Energy conservation;Splinting;Taping    PT Next Visit Plan hip strengthening, gait and balance training.  Progress functional strength as able    PT Home Exercise Plan exercises given at hospital    Consulted and Agree with Plan of Care Patient             Patient will benefit from skilled therapeutic intervention in order to improve the following deficits and impairments:  Abnormal gait, Difficulty walking, Decreased range of motion, Increased muscle spasms, Pain, Decreased activity tolerance, Decreased balance, Impaired flexibility, Improper body mechanics, Decreased mobility, Decreased strength  Visit Diagnosis: Pain in right hip  Muscle weakness (generalized)  Other abnormalities of gait and mobility  Other symptoms and signs involving the musculoskeletal system  Pain in left ankle and joints of left foot     Problem List Patient Active Problem List   Diagnosis Date Noted   S/P total right hip arthroplasty 08/26/2020   Vaginal Pap smear following hysterectomy for malignancy 03/02/2017   Screening for colorectal cancer 03/02/2017   Vaginal atrophy 03/02/2017   History of ovarian cancer 03/02/2017   Genetic testing 05/29/2015   Family history of kidney cancer    Family history of prostate cancer    Chemotherapy-induced neuropathy ([place]) 03/31/2015   Incisional hernia, incarcerated 10/15/2014   Ventral hernia 07/15/2014   Ovarian cancer on right [facility]) 06/17/2011   Hypertension 06/17/2011   H/O hysterectomy with oophorectomy 06/17/2011   Teena Irani, PTA/CLT [phone]  Teena Irani, PTA 09/15/2020, 10:03 AM  [place] [address] [place], [place],  [address] Phone: [phone]   Fax:  [phone]  Name: Sarah Walker MRN: [id number] Date of Birth: November 06, 1950

## 2020-09-17 ENCOUNTER — Ambulatory Visit (HOSPITAL_COMMUNITY): Payer: Medicare Other | Admitting: Physical Therapy

## 2020-09-17 ENCOUNTER — Other Ambulatory Visit: Payer: Self-pay

## 2020-09-17 ENCOUNTER — Encounter (HOSPITAL_COMMUNITY): Payer: Self-pay | Admitting: Physical Therapy

## 2020-09-17 DIAGNOSIS — R29898 Other symptoms and signs involving the musculoskeletal system: Secondary | ICD-10-CM | POA: Diagnosis not present

## 2020-09-17 DIAGNOSIS — R2689 Other abnormalities of gait and mobility: Secondary | ICD-10-CM

## 2020-09-17 DIAGNOSIS — M25551 Pain in right hip: Secondary | ICD-10-CM | POA: Diagnosis not present

## 2020-09-17 DIAGNOSIS — M6281 Muscle weakness (generalized): Secondary | ICD-10-CM | POA: Diagnosis not present

## 2020-09-17 DIAGNOSIS — M25572 Pain in left ankle and joints of left foot: Secondary | ICD-10-CM | POA: Diagnosis not present

## 2020-09-17 NOTE — Therapy (Signed)
[place], [place], [address] Phone: [phone]   Fax:  [phone]  Physical Therapy Treatment  Patient Details  Name: Sarah Walker MRN: [id number] Date of Birth: 04-28-1950 Referring Provider (PT): Edmonia Lynch MD   Encounter Date: 09/17/2020   PT End of Session - 09/17/20 1002     Visit Number 5    Number of Visits 16    Date for PT Re-Evaluation 10/23/20    Authorization Type [employer] medicare [employer] [facility] (no VL, no auth)    Progress Note Due on Visit 10    PT Start Time [id number]    PT Stop Time 1040    PT Time Calculation (min) 38 min    Activity Tolerance Patient tolerated treatment well    Behavior During Therapy [facility] for tasks assessed/performed             Past Medical History:  Diagnosis Date   Abdominal hernia    Allergic rhinitis    Arthritis    Bone spur of other site    left foot   Dry eyes    Family history of kidney cancer    Family history of prostate cancer    Hypertension    Ovarian cancer ([place])    Ovarian cancer on right ([place]) 06/17/2011   Peripheral neuropathy 08/26/2011   Chemotherapy-induced   Pulmonary embolism ([place])    Sinusitis     Past Surgical History:  Procedure Laterality Date   ABDOMINAL HYSTERECTOMY  05/25/2011   [place] bs&o and cancer staging   CHOLECYSTECTOMY  1990   COLONOSCOPY N/A 09/06/2014   Procedure: COLONOSCOPY;  Surgeon: Rogene Houston, MD;  [facility];  Service: Endoscopy;  Laterality: N/A;  930 - moved to 9/2 @ 8:25   [place]  10/15/2014   INCISIONAL HERNIA REPAIR  10/15/2014   Procedure: OPEN INCISIONAL HERNIA REPAIR WITH MESH AND MYOFASCIAL FLAPS;  Surgeon: Erroll Luna, MD;  Location: [place];  Service: General;;   IVC filter  05/24/11   PORT-A-CATH REMOVAL Right 11/21/2013   Procedure: MINOR REMOVAL PORT-A-CATH;  Surgeon: Jamesetta So, MD;  [facility];  Service: General;  Laterality: Right;   PORTACATH  PLACEMENT  06/16/11   Done at [place]     age 35's   TOTAL HIP ARTHROPLASTY Right 08/26/2020   Procedure: TOTAL HIP ARTHROPLASTY ANTERIOR APPROACH;  Surgeon: Renette Butters, MD;  [facility];  Service: Orthopedics;  Laterality: Right;   TUBAL LIGATION  1981    There were no vitals filed for this visit.   Subjective Assessment - 09/17/20 1003     Subjective She feels unsteady with SPC. Her home exercises have been doing Proof[employer].    Currently in Pain? No/denies                               [facility] Adult PT Treatment/Exercise - 09/17/20 0001       Knee/Hip Exercises: Standing   Heel Raises Both;20 reps    Heel Raises Limitations TR 20x    Knee Flexion Both;2 sets;10 reps    Hip Flexion Both;2 sets;10 reps    Hip Flexion Limitations standing march with HHA    Lateral Step Up Both;2 sets;10 reps;Hand Hold: 2;Step Height: 4"    Forward Step Up Both;2 sets;10 reps;Hand Hold: 2;Step Height: 4"    Functional Squat 2  sets;10 reps    SLS with Vectors 5X5" with unilateral UE                     PT Education - 09/17/20 1003     Education Details HEP    Person(s) Educated Patient    Methods Explanation    Comprehension Verbalized understanding              PT Short Term Goals - 09/11/20 1023       PT SHORT TERM GOAL #1   Title Patient will be independent with HEP in order to improve functional outcomes.    Time 4    Period Weeks    Status On-going    Target Date 09/25/20      PT SHORT TERM GOAL #2   Title Patient will report at least 25% improvement in symptoms for improved quality of life.    Time 4    Period Weeks    Status Achieved    Target Date 09/25/20               PT Long Term Goals - 09/11/20 1024       PT LONG TERM GOAL #1   Title Patient will report at least 75% improvement in symptoms for improved quality of life.    Time 8    Period Weeks    Status On-going      PT LONG TERM GOAL  #2   Title Patient will improve FOTO score by at least 25 points in order to indicate improved tolerance to activity.    Time 8    Period Weeks    Status On-going      PT LONG TERM GOAL #3   Title Patient will be able to complete 5x STS in under 11.4 seconds in order to reduce the risk of falls.    Time 8    Period Weeks    Status On-going      PT LONG TERM GOAL #4   Title Patient will be able to navigate stairs with reciprocal pattern without compensation in order to demonstrate improved LE strength.    Time 8    Period Weeks    Status On-going                   Plan - 09/17/20 1002     Clinical Impression Statement Continued with standing strengthening exercises today. Patient able to complete increased reps of previously completed exercises. Patient with tendency for weight shifting with ankle exercises rather than lift secondary to bilateral ankle weakness. She requires UE support with exercises completed secondary to impaired balance and strength. Able to complete step up from increased step height. Patient will continue to benefit from skilled physical therapy in order to reduce impairment and improve function.    Personal Factors and Comorbidities Age;Fitness;Comorbidity 1    Comorbidities neuropathy    Examination-Activity Limitations Locomotion Level;Transfers;Bed Mobility;Bend;Squat;Stairs;Stand;Lift;Dressing;Hygiene/Grooming    Examination-Participation Restrictions Meal Prep;Cleaning;Shop;Volunteer;Yard Work;Laundry;Driving    Stability/Clinical Decision Making Stable/Uncomplicated    Rehab Potential Good    PT Frequency 2x / week    PT Duration 8 weeks    PT Treatment/Interventions ADLs/Self Care Home Management;Aquatic Therapy;Electrical Stimulation;Iontophoresis '4mg'$ /ml Dexamethasone;Traction;Moist Heat;Ultrasound;DME Instruction;Gait training;Stair training;Functional mobility training;Therapeutic activities;Therapeutic exercise;Balance training;Neuromuscular  re-education;Patient/family education;Manual techniques;Manual lymph drainage;Orthotic Fit/Training;Compression bandaging;Scar mobilization;Passive range of motion;Dry needling;Energy conservation;Splinting;Taping    PT Next Visit Plan hip strengthening, gait and balance training.  Progress functional strength as able    PT  Home Exercise Plan exercises given at hospital 9/14 vector stance    Consulted and Agree with Plan of Care Patient             Patient will benefit from skilled therapeutic intervention in order to improve the following deficits and impairments:  Abnormal gait, Difficulty walking, Decreased range of motion, Increased muscle spasms, Pain, Decreased activity tolerance, Decreased balance, Impaired flexibility, Improper body mechanics, Decreased mobility, Decreased strength  Visit Diagnosis: Pain in right hip  Muscle weakness (generalized)  Other abnormalities of gait and mobility  Other symptoms and signs involving the musculoskeletal system     Problem List Patient Active Problem List   Diagnosis Date Noted   S/P total right hip arthroplasty 08/26/2020   Vaginal Pap smear following hysterectomy for malignancy 03/02/2017   Screening for colorectal cancer 03/02/2017   Vaginal atrophy 03/02/2017   History of ovarian cancer 03/02/2017   Genetic testing 05/29/2015   Family history of kidney cancer    Family history of prostate cancer    Chemotherapy-induced neuropathy ([place]) 03/31/2015   Incisional hernia, incarcerated 10/15/2014   Ventral hernia 07/15/2014   Ovarian cancer on right ([place]) 06/17/2011   Hypertension 06/17/2011   H/O hysterectomy with oophorectomy 06/17/2011    10:40 AM, 09/17/20 Mearl Latin PT, DPT Physical Therapist at [facility] [address] [place], [place], [address] Phone: [phone]   Fax:  [phone]  Name: Sarah Walker MRN:  [id number] Date of Birth: 1950/09/05

## 2020-09-17 NOTE — Patient Instructions (Signed)
Access Code: [email or web address] URL: [email or web address] Date: 09/17/2020 Prepared by: Mitzi Hansen Lyric Rossano  Exercises Single Leg Balance with Clock Reach - 1 x daily - 7 x weekly - 5 reps - 5 second hold

## 2020-09-22 ENCOUNTER — Encounter (HOSPITAL_COMMUNITY): Payer: Medicare Other

## 2020-09-23 ENCOUNTER — Ambulatory Visit (HOSPITAL_COMMUNITY): Payer: Medicare Other | Admitting: Physical Therapy

## 2020-09-23 ENCOUNTER — Other Ambulatory Visit: Payer: Self-pay

## 2020-09-23 DIAGNOSIS — R2689 Other abnormalities of gait and mobility: Secondary | ICD-10-CM | POA: Diagnosis not present

## 2020-09-23 DIAGNOSIS — M25572 Pain in left ankle and joints of left foot: Secondary | ICD-10-CM | POA: Diagnosis not present

## 2020-09-23 DIAGNOSIS — M25551 Pain in right hip: Secondary | ICD-10-CM | POA: Diagnosis not present

## 2020-09-23 DIAGNOSIS — M6281 Muscle weakness (generalized): Secondary | ICD-10-CM

## 2020-09-23 DIAGNOSIS — R29898 Other symptoms and signs involving the musculoskeletal system: Secondary | ICD-10-CM | POA: Diagnosis not present

## 2020-09-23 NOTE — Therapy (Signed)
[place], [place], [address] Phone: [phone]   Fax:  [phone]  Physical Therapy Treatment  Patient Details  Name: Sarah Walker MRN: [id number] Date of Birth: 1950-09-18 Referring Provider (PT): Edmonia Lynch MD   Encounter Date: 09/23/2020   PT End of Session - 09/23/20 1125     Visit Number 6    Number of Visits 16    Date for PT Re-Evaluation 10/23/20    Authorization Type [employer]are [employer] [facility] (no VL, no auth)    Progress Note Due on Visit 10    PT Start Time 1045    PT Stop Time 1128    PT Time Calculation (min) 43 min    Activity Tolerance Patient tolerated treatment well    Behavior During Therapy [facility] for tasks assessed/performed             Past Medical History:  Diagnosis Date   Abdominal hernia    Allergic rhinitis    Arthritis    Bone spur of other site    left foot   Dry eyes    Family history of kidney cancer    Family history of prostate cancer    Hypertension    Ovarian cancer ([place])    Ovarian cancer on right ([place]) 06/17/2011   Peripheral neuropathy 08/26/2011   Chemotherapy-induced   Pulmonary embolism ([place])    Sinusitis     Past Surgical History:  Procedure Laterality Date   ABDOMINAL HYSTERECTOMY  05/25/2011   [place] bs&o and cancer staging   CHOLECYSTECTOMY  1990   COLONOSCOPY N/A 09/06/2014   Procedure: COLONOSCOPY;  Surgeon: Rogene Houston, MD;  [facility];  Service: Endoscopy;  Laterality: N/A;  930 - moved to 9/2 @ 8:25   [place]  10/15/2014   INCISIONAL HERNIA REPAIR  10/15/2014   Procedure: OPEN INCISIONAL HERNIA REPAIR WITH MESH AND MYOFASCIAL FLAPS;  Surgeon: Erroll Luna, MD;  Location: [place];  Service: General;;   IVC filter  05/24/11   PORT-A-CATH REMOVAL Right 11/21/2013   Procedure: MINOR REMOVAL PORT-A-CATH;  Surgeon: Jamesetta So, MD;  [facility];  Service: General;  Laterality: Right;   PORTACATH  PLACEMENT  06/16/11   Done at [place]     age 51's   TOTAL HIP ARTHROPLASTY Right 08/26/2020   Procedure: TOTAL HIP ARTHROPLASTY ANTERIOR APPROACH;  Surgeon: Renette Butters, MD;  [facility];  Service: Orthopedics;  Laterality: Right;   TUBAL LIGATION  1981    There were no vitals filed for this visit.   Subjective Assessment - 09/23/20 1050     Subjective Pt states no pain or issues.  Continues to use [facility] for ambulation.    Currently in Pain? No/denies                               [facility] Adult PT Treatment/Exercise - 09/23/20 0001       Knee/Hip Exercises: Standing   Heel Raises Both;20 reps    Heel Raises Limitations TR 20x    Knee Flexion Both;2 sets;10 reps    Hip Flexion Both;2 sets;10 reps    Hip Flexion Limitations standing march with HHA    Forward Lunges Right;15 reps    Forward Lunges Limitations onto 2" step no UE asisst working on stability    Hip Abduction Both;15 reps  Lateral Step Up Left;Step Height: 4";Right;Step Height: 2";10 reps;Hand Hold: 1    Forward Step Up Left;Step Height: 6";Right;Step Height: 2";10 reps;Hand Hold: 1    Functional Squat 15 reps    SLS with Vectors 5X5" with unilateral UE      Knee/Hip Exercises: Seated   Sit to Sand 10 reps;without UE support   black airex pad in standard chair                      PT Short Term Goals - 09/11/20 1023       PT SHORT TERM GOAL #1   Title Patient will be independent with HEP in order to improve functional outcomes.    Time 4    Period Weeks    Status On-going    Target Date 09/25/20      PT SHORT TERM GOAL #2   Title Patient will report at least 25% improvement in symptoms for improved quality of life.    Time 4    Period Weeks    Status Achieved    Target Date 09/25/20               PT Long Term Goals - 09/11/20 1024       PT LONG TERM GOAL #1   Title Patient will report at least 75% improvement in symptoms for  improved quality of life.    Time 8    Period Weeks    Status On-going      PT LONG TERM GOAL #2   Title Patient will improve FOTO score by at least 25 points in order to indicate improved tolerance to activity.    Time 8    Period Weeks    Status On-going      PT LONG TERM GOAL #3   Title Patient will be able to complete 5x STS in under 11.4 seconds in order to reduce the risk of falls.    Time 8    Period Weeks    Status On-going      PT LONG TERM GOAL #4   Title Patient will be able to navigate stairs with reciprocal pattern without compensation in order to demonstrate improved LE strength.    Time 8    Period Weeks    Status On-going                   Plan - 09/23/20 1126     Clinical Impression Statement Continued cues to use LE mm correctly rather than substituting with body to complete task.   Constant cues to maintain upright posturing with therex with controlled movements/contractions.    Progressed to 6" step with forward step ups, however reduced to 2" with Rt LE lateral and forward step [place] to be able to reduce to 1 UE assist.   Encouraged to complete lunges without UE as hesitant to do this.  Poor form with squats so changed to sit to stands to utilize correct mm and improve form.  Pt unable to complete from standard height without UE due to weakness but able to complete with airex pad.  Encouraged to begin doing exercises relying less on UE assist    Personal Factors and Comorbidities Age;Fitness;Comorbidity 1    Comorbidities neuropathy    Examination-Activity Limitations Locomotion Level;Transfers;Bed Mobility;Bend;Squat;Stairs;Stand;Lift;Dressing;Hygiene/Grooming    Examination-Participation Restrictions Meal Prep;Cleaning;Shop;Volunteer;Yard Work;Laundry;Driving    Stability/Clinical Decision Making Stable/Uncomplicated    Rehab Potential Good    PT Frequency 2x / week  PT Duration 8 weeks    PT Treatment/Interventions ADLs/Self Care Home  Management;Aquatic Therapy;Electrical Stimulation;Iontophoresis 4mg /ml Dexamethasone;Traction;Moist Heat;Ultrasound;DME Instruction;Gait training;Stair training;Functional mobility training;Therapeutic activities;Therapeutic exercise;Balance training;Neuromuscular re-education;Patient/family education;Manual techniques;Manual lymph drainage;Orthotic Fit/Training;Compression bandaging;Scar mobilization;Passive range of motion;Dry needling;Energy conservation;Splinting;Taping    PT Next Visit Plan hip strengthening, gait and balance training.  Progress functional strength with reduced UE assist    PT Home Exercise Plan exercises given at hospital 9/14 vector stance    Consulted and Agree with Plan of Care Patient             Patient will benefit from skilled therapeutic intervention in order to improve the following deficits and impairments:  Abnormal gait, Difficulty walking, Decreased range of motion, Increased muscle spasms, Pain, Decreased activity tolerance, Decreased balance, Impaired flexibility, Improper body mechanics, Decreased mobility, Decreased strength  Visit Diagnosis: Pain in right hip  Muscle weakness (generalized)     Problem List Patient Active Problem List   Diagnosis Date Noted   S/P total right hip arthroplasty 08/26/2020   Vaginal Pap smear following hysterectomy for malignancy 03/02/2017   Screening for colorectal cancer 03/02/2017   Vaginal atrophy 03/02/2017   History of ovarian cancer 03/02/2017   Genetic testing 05/29/2015   Family history of kidney cancer    Family history of prostate cancer    Chemotherapy-induced neuropathy ([place]) 03/31/2015   Incisional hernia, incarcerated 10/15/2014   Ventral hernia 07/15/2014   Ovarian cancer on right [facility]) 06/17/2011   Hypertension 06/17/2011   H/O hysterectomy with oophorectomy 06/17/2011   Teena Irani, PTA/CLT [phone]  Teena Irani, PTA 09/23/2020, 11:27 AM  [place] [address] [place], [place], [address] Phone: [phone]   Fax:  [phone]  Name: Sarah Walker MRN: [id number] Date of Birth: 20-Jun-1950

## 2020-09-24 ENCOUNTER — Encounter (HOSPITAL_COMMUNITY): Payer: Medicare Other | Admitting: Physical Therapy

## 2020-09-25 ENCOUNTER — Ambulatory Visit (HOSPITAL_COMMUNITY): Payer: Medicare Other | Admitting: Physical Therapy

## 2020-09-25 ENCOUNTER — Other Ambulatory Visit: Payer: Self-pay

## 2020-09-25 DIAGNOSIS — M25572 Pain in left ankle and joints of left foot: Secondary | ICD-10-CM | POA: Diagnosis not present

## 2020-09-25 DIAGNOSIS — M6281 Muscle weakness (generalized): Secondary | ICD-10-CM | POA: Diagnosis not present

## 2020-09-25 DIAGNOSIS — M25551 Pain in right hip: Secondary | ICD-10-CM | POA: Diagnosis not present

## 2020-09-25 DIAGNOSIS — R2689 Other abnormalities of gait and mobility: Secondary | ICD-10-CM | POA: Diagnosis not present

## 2020-09-25 DIAGNOSIS — R29898 Other symptoms and signs involving the musculoskeletal system: Secondary | ICD-10-CM | POA: Diagnosis not present

## 2020-09-25 NOTE — Therapy (Signed)
[place], [place], [address] Phone: [phone]   Fax:  [phone]  Physical Therapy Treatment  Patient Details  Name: Sarah Walker MRN: [id number] Date of Birth: Jul 30, 1950 Referring Provider (PT): Edmonia Lynch MD   Encounter Date: 09/25/2020   PT End of Session - 09/25/20 1320     Visit Number 7    Number of Visits 16    Date for PT Re-Evaluation 10/23/20    Authorization Type [employer] medicare [employer] [facility] (no VL, no auth)    Progress Note Due on Visit 10    PT Start Time 1003    PT Stop Time 1045    PT Time Calculation (min) 42 min    Activity Tolerance Patient tolerated treatment well    Behavior During Therapy [facility] for tasks assessed/performed             Past Medical History:  Diagnosis Date   Abdominal hernia    Allergic rhinitis    Arthritis    Bone spur of other site    left foot   Dry eyes    Family history of kidney cancer    Family history of prostate cancer    Hypertension    Ovarian cancer ([place])    Ovarian cancer on right ([place]) 06/17/2011   Peripheral neuropathy 08/26/2011   Chemotherapy-induced   Pulmonary embolism ([place])    Sinusitis     Past Surgical History:  Procedure Laterality Date   ABDOMINAL HYSTERECTOMY  05/25/2011   [place] bs&o and cancer staging   CHOLECYSTECTOMY  1990   COLONOSCOPY N/A 09/06/2014   Procedure: COLONOSCOPY;  Surgeon: Rogene Houston, MD;  [facility];  Service: Endoscopy;  Laterality: N/A;  930 - moved to 9/2 @ 8:25   [place]  10/15/2014   INCISIONAL HERNIA REPAIR  10/15/2014   Procedure: OPEN INCISIONAL HERNIA REPAIR WITH MESH AND MYOFASCIAL FLAPS;  Surgeon: Erroll Luna, MD;  Location: [place];  Service: General;;   IVC filter  05/24/11   PORT-A-CATH REMOVAL Right 11/21/2013   Procedure: MINOR REMOVAL PORT-A-CATH;  Surgeon: Jamesetta So, MD;  Location: [facility];  Service: General;  Laterality: Right;   PORTACATH  PLACEMENT  06/16/11   Done at [place]     age 70's   TOTAL HIP ARTHROPLASTY Right 08/26/2020   Procedure: TOTAL HIP ARTHROPLASTY ANTERIOR APPROACH;  Surgeon: Renette Butters, MD;  [facility];  Service: Orthopedics;  Laterality: Right;   TUBAL LIGATION  1981    There were no vitals filed for this visit.                      [facility] Adult PT Treatment/Exercise - 09/25/20 0001       Knee/Hip Exercises: Standing   Heel Raises Both;20 reps    Knee Flexion Both;2 sets;10 reps;Limitations    Knee Flexion Limitations 2#    Hip Flexion Both;2 sets;10 reps;Limitations    Hip Flexion Limitations standing march 2#with HHA    Forward Lunges Right;15 reps    Forward Lunges Limitations onto 2" step no UE asisst working on stability    Hip Abduction Both;2 sets;10 reps;Limitations    Abduction Limitations 2#    Lateral Step Up Left;Step Height: 4";Right;Step Height: 2";10 reps;Hand Hold: 1    Forward Step Up Left;Step Height: 6";Right;Step Height: 2";10 reps;Hand Hold: 1    SLS with Vectors 5X5" with unilateral  UE      Knee/Hip Exercises: Seated   Long Arc Quad Right;10 reps    Sit to [employer] 10 reps;without UE support   with black airex in chair no UE                      PT Short Term Goals - 09/11/20 1023       PT SHORT TERM GOAL #1   Title Patient will be independent with HEP in order to improve functional outcomes.    Time 4    Period Weeks    Status On-going    Target Date 09/25/20      PT SHORT TERM GOAL #2   Title Patient will report at least 25% improvement in symptoms for improved quality of life.    Time 4    Period Weeks    Status Achieved    Target Date 09/25/20               PT Long Term Goals - 09/11/20 1024       PT LONG TERM GOAL #1   Title Patient will report at least 75% improvement in symptoms for improved quality of life.    Time 8    Period Weeks    Status On-going      PT LONG TERM GOAL  #2   Title Patient will improve FOTO score by at least 25 points in order to indicate improved tolerance to activity.    Time 8    Period Weeks    Status On-going      PT LONG TERM GOAL #3   Title Patient will be able to complete 5x STS in under 11.4 seconds in order to reduce the risk of falls.    Time 8    Period Weeks    Status On-going      PT LONG TERM GOAL #4   Title Patient will be able to navigate stairs with reciprocal pattern without compensation in order to demonstrate improved LE strength.    Time 8    Period Weeks    Status On-going                   Plan - 09/25/20 1321     Clinical Impression Statement Pt reports soreness following last session with addition of step ups and reduction of UE assist with activities.  Continued with established POC with addition of 2# weights with standing exercises.  Continue to encourage completion of standing exercises with reduced UE assist.  Most difficult with Rt single leg activities.  Pt tends to lock out her knee and use her hip mm to navigate step ups.  Cues to bend knee and use quadriceps with increased challenge noted.    Added LAQ to HEP to work on improving quad strength.    Personal Factors and Comorbidities Age;Fitness;Comorbidity 1    Comorbidities neuropathy    Examination-Activity Limitations Locomotion Level;Transfers;Bed Mobility;Bend;Squat;Stairs;Stand;Lift;Dressing;Hygiene/Grooming    Examination-Participation Restrictions Meal Prep;Cleaning;Shop;Volunteer;Yard Work;Laundry;Driving    Stability/Clinical Decision Making Stable/Uncomplicated    Rehab Potential Good    PT Frequency 2x / week    PT Duration 8 weeks    PT Treatment/Interventions ADLs/Self Care Home Management;Aquatic Therapy;Electrical Stimulation;Iontophoresis 4mg /ml Dexamethasone;Traction;Moist Heat;Ultrasound;DME Instruction;Gait training;Stair training;Functional mobility training;Therapeutic activities;Therapeutic exercise;Balance  training;Neuromuscular re-education;Patient/family education;Manual techniques;Manual lymph drainage;Orthotic Fit/Training;Compression bandaging;Scar mobilization;Passive range of motion;Dry needling;Energy conservation;Splinting;Taping    PT Next Visit Plan hip strengthening, gait and balance training.  Progress functional strength with reduced UE  assist    PT Home Exercise Plan exercises given at hospital 9/14 vector stance  9/22: LAQ    Consulted and Agree with Plan of Care Patient             Patient will benefit from skilled therapeutic intervention in order to improve the following deficits and impairments:  Abnormal gait, Difficulty walking, Decreased range of motion, Increased muscle spasms, Pain, Decreased activity tolerance, Decreased balance, Impaired flexibility, Improper body mechanics, Decreased mobility, Decreased strength  Visit Diagnosis: Pain in right hip  Muscle weakness (generalized)  Other abnormalities of gait and mobility     Problem List Patient Active Problem List   Diagnosis Date Noted   S/P total right hip arthroplasty 08/26/2020   Vaginal Pap smear following hysterectomy for malignancy 03/02/2017   Screening for colorectal cancer 03/02/2017   Vaginal atrophy 03/02/2017   History of ovarian cancer 03/02/2017   Genetic testing 05/29/2015   Family history of kidney cancer    Family history of prostate cancer    Chemotherapy-induced neuropathy ([place]) 03/31/2015   Incisional hernia, incarcerated 10/15/2014   Ventral hernia 07/15/2014   Ovarian cancer on right [facility]) 06/17/2011   Hypertension 06/17/2011   H/O hysterectomy with oophorectomy 06/17/2011    Teena Irani, PTA/CLT [id number]   Teena Irani, PTA 09/25/2020, 1:24 PM  [facility] [address] [place], [place], [address] Phone: [phone]   Fax:  [phone]  Name: Sarah Walker MRN: [id number] Date of Birth: 1950-12-19

## 2020-09-29 ENCOUNTER — Ambulatory Visit (HOSPITAL_COMMUNITY): Payer: Medicare Other | Admitting: Physical Therapy

## 2020-10-01 ENCOUNTER — Encounter (HOSPITAL_COMMUNITY): Payer: Medicare Other | Admitting: Physical Therapy

## 2020-10-01 DIAGNOSIS — J302 Other seasonal allergic rhinitis: Secondary | ICD-10-CM | POA: Diagnosis not present

## 2020-10-01 DIAGNOSIS — J310 Chronic rhinitis: Secondary | ICD-10-CM | POA: Diagnosis not present

## 2020-10-02 ENCOUNTER — Ambulatory Visit (HOSPITAL_COMMUNITY): Payer: Medicare Other | Admitting: Physical Therapy

## 2020-10-06 ENCOUNTER — Ambulatory Visit (HOSPITAL_COMMUNITY): Payer: Medicare Other | Admitting: Physical Therapy

## 2020-10-08 ENCOUNTER — Encounter (HOSPITAL_COMMUNITY): Payer: Medicare Other | Admitting: Physical Therapy

## 2020-10-13 ENCOUNTER — Encounter (HOSPITAL_COMMUNITY): Payer: Medicare Other

## 2020-10-14 DIAGNOSIS — J302 Other seasonal allergic rhinitis: Secondary | ICD-10-CM | POA: Diagnosis not present

## 2020-10-14 DIAGNOSIS — J310 Chronic rhinitis: Secondary | ICD-10-CM | POA: Diagnosis not present

## 2020-10-14 DIAGNOSIS — Z0001 Encounter for general adult medical examination with abnormal findings: Secondary | ICD-10-CM | POA: Diagnosis not present

## 2020-10-14 DIAGNOSIS — J069 Acute upper respiratory infection, unspecified: Secondary | ICD-10-CM | POA: Diagnosis not present

## 2020-10-14 DIAGNOSIS — M1990 Unspecified osteoarthritis, unspecified site: Secondary | ICD-10-CM | POA: Diagnosis not present

## 2020-10-14 DIAGNOSIS — E782 Mixed hyperlipidemia: Secondary | ICD-10-CM | POA: Diagnosis not present

## 2020-10-14 DIAGNOSIS — I10 Essential (primary) hypertension: Secondary | ICD-10-CM | POA: Diagnosis not present

## 2020-10-14 DIAGNOSIS — H1045 Other chronic allergic conjunctivitis: Secondary | ICD-10-CM | POA: Diagnosis not present

## 2020-10-15 ENCOUNTER — Encounter (HOSPITAL_COMMUNITY): Payer: Medicare Other

## 2020-10-20 ENCOUNTER — Ambulatory Visit (HOSPITAL_COMMUNITY): Payer: Medicare Other | Attending: Orthopedic Surgery | Admitting: Physical Therapy

## 2020-10-20 ENCOUNTER — Other Ambulatory Visit: Payer: Self-pay

## 2020-10-20 ENCOUNTER — Encounter (HOSPITAL_COMMUNITY): Payer: Self-pay | Admitting: Physical Therapy

## 2020-10-20 DIAGNOSIS — R2689 Other abnormalities of gait and mobility: Secondary | ICD-10-CM | POA: Insufficient documentation

## 2020-10-20 DIAGNOSIS — M25551 Pain in right hip: Secondary | ICD-10-CM | POA: Diagnosis not present

## 2020-10-20 DIAGNOSIS — R29898 Other symptoms and signs involving the musculoskeletal system: Secondary | ICD-10-CM | POA: Insufficient documentation

## 2020-10-20 DIAGNOSIS — M1611 Unilateral primary osteoarthritis, right hip: Secondary | ICD-10-CM | POA: Diagnosis not present

## 2020-10-20 DIAGNOSIS — M6281 Muscle weakness (generalized): Secondary | ICD-10-CM | POA: Insufficient documentation

## 2020-10-20 NOTE — Therapy (Signed)
[place], [place], [address] Phone: [phone]   Fax:  [phone]  Physical Therapy Treatment/Progress Note/Recert  Patient Details  Name: Sarah Walker MRN: [id number] Date of Birth: 1950-03-09 Referring Provider (PT): Edmonia Lynch MD   Encounter Date: 10/20/2020  Progress Note   Reporting Period 08/28/20 to 10/20/20   See note below for Objective Data and Assessment of Progress/Goals    PT End of Session - 10/20/20 0959     Visit Number 8    Number of Visits 24    Date for PT Re-Evaluation 11/17/20    Authorization Type [employer] medicare [employer] [facility] (no VL, no auth)    Progress Note Due on Visit 18    PT Start Time 1000    PT Stop Time 1038    PT Time Calculation (min) 38 min    Activity Tolerance Patient tolerated treatment well    Behavior During Therapy [facility] for tasks assessed/performed             Past Medical History:  Diagnosis Date   Abdominal hernia    Allergic rhinitis    Arthritis    Bone spur of other site    left foot   Dry eyes    Family history of kidney cancer    Family history of prostate cancer    Hypertension    Ovarian cancer ([place])    Ovarian cancer on right ([place]) 06/17/2011   Peripheral neuropathy 08/26/2011   Chemotherapy-induced   Pulmonary embolism ([place])    Sinusitis     Past Surgical History:  Procedure Laterality Date   ABDOMINAL HYSTERECTOMY  05/25/2011   [place] bs&o and cancer staging   CHOLECYSTECTOMY  1990   COLONOSCOPY N/A 09/06/2014   Procedure: COLONOSCOPY;  Surgeon: Rogene Houston, MD;  [facility];  Service: Endoscopy;  Laterality: N/A;  930 - moved to 9/2 @ 8:25   [place]  10/15/2014   INCISIONAL HERNIA REPAIR  10/15/2014   Procedure: OPEN INCISIONAL HERNIA REPAIR WITH MESH AND MYOFASCIAL FLAPS;  Surgeon: Erroll Luna, MD;  Location: [place];  Service: General;;   IVC filter  05/24/11   PORT-A-CATH REMOVAL Right  11/21/2013   Procedure: MINOR REMOVAL PORT-A-CATH;  Surgeon: Jamesetta So, MD;  Location[facility];  Service: General;  Laterality: Right;   PORTACATH PLACEMENT  06/16/11   Done at [place]     age 70's   TOTAL HIP ARTHROPLASTY Right 08/26/2020   Procedure: TOTAL HIP ARTHROPLASTY ANTERIOR APPROACH;  Surgeon: Renette Butters, MD;  [facility];  Service: Orthopedics;  Laterality: Right;   TUBAL LIGATION  1981    There were no vitals filed for this visit.   Subjective Assessment - 10/20/20 1000     Subjective Patient has been sick last few weeks. Hip will be 8 weeks tomorrow. She has been getting around alright. Has been trying to walk more. She hasn't been out much. States 75% improvement with PT intervention. Notices progress.    Currently in Pain? No/denies                [facility] PT Assessment - 10/20/20 0001       Assessment   Medical Diagnosis R anterior THA    Referring Provider (PT) Edmonia Lynch MD    Onset Date/Surgical Date 08/26/20    Next MD Visit today    Prior Therapy yes shoulder  Precautions   Precautions None    Precaution Comments anterior hip - was not told precautions      Restrictions   Weight Bearing Restrictions No      Balance Screen   Has the patient fallen in the past 6 months No    Has the patient had a decrease in activity level because of a fear of falling?  No    Is the patient reluctant to leave their home because of a fear of falling?  No      Prior Function   Level of Independence Independent    Vocation Retired      Charity fundraiser Status Within Functional Limits for tasks assessed      Observation/Other Assessments   Observations ambulates with SPC, slow labored cadence and transfers    Focus on Therapeutic Outcomes (FOTO)  53% function      Strength   Right Hip Flexion 3+/5    Left Hip Flexion 4+/5    Right Knee Flexion 4+/5    Right Knee Extension 4-/5    Left Knee Flexion 5/5     Left Knee Extension 5/5    Right Ankle Dorsiflexion 5/5    Left Ankle Dorsiflexion 4+/5      Transfers   Five time sit to stand comments  17.27 seconds, slow, labored requires use of hands; unable without UE use; relies on momentum      Ambulation/Gait   Ambulation/Gait Yes    Ambulation/Gait Assistance 6: Modified independent (Device/Increase time)    Ambulation Distance (Feet) 100 Feet    Assistive device Straight cane    Gait Pattern Antalgic    Ambulation Surface Level    Gait Comments stairs: compeltes with step too patern using LLE only; gait: cueing for proper SPC use                           [facility] Adult PT Treatment/Exercise - 10/20/20 0001       Knee/Hip Exercises: Standing   Heel Raises Both;20 reps    Heel Raises Limitations TR 20x    Hip Flexion Both;2 sets;10 reps;Limitations    Hip Flexion Limitations standing march 2#with HHA    Hip Abduction Both;2 sets;10 reps;Limitations    Abduction Limitations 2#    Forward Step Up Right;2 sets;10 reps;Hand Hold: 2;Step Height: 4"      Knee/Hip Exercises: Seated   Long Arc Quad Right;10 reps    Long Arc Quad Weight 2 lbs.    Long [employer] Limitations 5 second    Sit to [employer] 10 reps;without UE support;2 sets   black foam                    PT Education - 10/20/20 1000     Education Details HEP, reassessment findings    Person(s) Educated Patient    Methods Explanation    Comprehension Verbalized understanding              PT Short Term Goals - 10/20/20 1003       PT SHORT TERM GOAL #1   Title Patient will be independent with HEP in order to improve functional outcomes.    Time 4    Period Weeks    Status Achieved    Target Date 09/25/20      PT SHORT TERM GOAL #2   Title Patient will report at least 25% improvement in symptoms for improved  quality of life.    Time 4    Period Weeks    Status Achieved    Target Date 09/25/20               PT Long Term Goals -  10/20/20 1004       PT LONG TERM GOAL #1   Title Patient will report at least 75% improvement in symptoms for improved quality of life.    Time 8    Period Weeks    Status Achieved      PT LONG TERM GOAL #2   Title Patient will improve FOTO score by at least 25 points in order to indicate improved tolerance to activity.    Time 8    Period Weeks    Status Achieved      PT LONG TERM GOAL #3   Title Patient will be able to complete 5x STS in under 11.4 seconds in order to reduce the risk of falls.    Time 8    Period Weeks    Status On-going      PT LONG TERM GOAL #4   Title Patient will be able to navigate stairs with reciprocal pattern without compensation in order to demonstrate improved LE strength.    Time 8    Period Weeks    Status On-going                   Plan - 10/20/20 1000     Clinical Impression Statement Patient returns to therapy after several weeks of being sick. Patient has met 2/2 short term goals and 2/4 long term goals with ability to complete HEP and improvement in symptoms, activity tolerance. Patient continues to remain very limited with R hip and LE strength, gait, balance and functional mobility with ADL. Educated patient on progress and discussed further limitations. Extending POC to continue to promote strength and improve gait/balance deficits. Intermittent cueing given for mechanics and avoiding compensations. Patient will continue to benefit from skilled physical therapy in order to reduce impairment and improve function.    Personal Factors and Comorbidities Age;Fitness;Comorbidity 1    Comorbidities neuropathy    Examination-Activity Limitations Locomotion Level;Transfers;Bed Mobility;Bend;Squat;Stairs;Stand;Lift;Dressing;Hygiene/Grooming    Examination-Participation Restrictions Meal Prep;Cleaning;Shop;Volunteer;Yard Work;Laundry;Driving    Stability/Clinical Decision Making Stable/Uncomplicated    Rehab Potential Good    PT Frequency 2x  / week    PT Duration 8 weeks    PT Treatment/Interventions ADLs/Self Care Home Management;Aquatic Therapy;Electrical Stimulation;Iontophoresis 26m/ml Dexamethasone;Traction;Moist Heat;Ultrasound;DME Instruction;Gait training;Stair training;Functional mobility training;Therapeutic activities;Therapeutic exercise;Balance training;Neuromuscular re-education;Patient/family education;Manual techniques;Manual lymph drainage;Orthotic Fit/Training;Compression bandaging;Scar mobilization;Passive range of motion;Dry needling;Energy conservation;Splinting;Taping    PT Next Visit Plan hip strengthening, gait and balance training.  Progress functional strength with reduced UE assist    PT Home Exercise Plan exercises given at hospital 9/14 vector stance  9/22: LAQ    Consulted and Agree with Plan of Care Patient             Patient will benefit from skilled therapeutic intervention in order to improve the following deficits and impairments:  Abnormal gait, Difficulty walking, Decreased range of motion, Increased muscle spasms, Pain, Decreased activity tolerance, Decreased balance, Impaired flexibility, Improper body mechanics, Decreased mobility, Decreased strength  Visit Diagnosis: Pain in right hip  Muscle weakness (generalized)  Other abnormalities of gait and mobility  Other symptoms and signs involving the musculoskeletal system     Problem List Patient Active Problem List   Diagnosis Date Noted   S/P total right  hip arthroplasty 08/26/2020   Vaginal Pap smear following hysterectomy for malignancy 03/02/2017   Screening for colorectal cancer 03/02/2017   Vaginal atrophy 03/02/2017   History of ovarian cancer 03/02/2017   Genetic testing 05/29/2015   Family history of kidney cancer    Family history of prostate cancer    Chemotherapy-induced neuropathy ([place]) 03/31/2015   Incisional hernia, incarcerated 10/15/2014   Ventral hernia 07/15/2014   Ovarian cancer on right [facility]) 06/17/2011    Hypertension 06/17/2011   H/O hysterectomy with oophorectomy 06/17/2011    10:40 AM, 10/20/20 Mearl Latin PT, DPT Physical Therapist at [facility], [place], [address] Phone: [phone]   Fax:  [phone]  Name: Sarah Walker MRN: [id number] Date of Birth: [birth year]

## 2020-10-22 ENCOUNTER — Ambulatory Visit (HOSPITAL_COMMUNITY): Payer: Medicare Other | Admitting: Physical Therapy

## 2020-10-22 ENCOUNTER — Encounter (HOSPITAL_COMMUNITY): Payer: Self-pay | Admitting: Physical Therapy

## 2020-10-22 ENCOUNTER — Other Ambulatory Visit: Payer: Self-pay

## 2020-10-22 DIAGNOSIS — M25551 Pain in right hip: Secondary | ICD-10-CM

## 2020-10-22 DIAGNOSIS — R2689 Other abnormalities of gait and mobility: Secondary | ICD-10-CM | POA: Diagnosis not present

## 2020-10-22 DIAGNOSIS — M6281 Muscle weakness (generalized): Secondary | ICD-10-CM

## 2020-10-22 DIAGNOSIS — R29898 Other symptoms and signs involving the musculoskeletal system: Secondary | ICD-10-CM

## 2020-10-22 NOTE — Therapy (Signed)
[place], [place], [address] Phone: [phone]   Fax:  [phone]  Physical Therapy Treatment  Patient Details  Name: Sarah Walker MRN: [id number] Date of Birth: March 03, 1950 Referring Provider (PT): Edmonia Lynch MD   Encounter Date: 10/22/2020   PT End of Session - 10/22/20 1003     Visit Number 8    Number of Visits 24    Date for PT Re-Evaluation 11/17/20    Authorization Type [employer] medicare [employer] [facility] (no VL, no auth)    Progress Note Due on Visit 18    PT Start Time 1003    PT Stop Time 1041    PT Time Calculation (min) 38 min    Activity Tolerance Patient tolerated treatment well    Behavior During Therapy [place] for tasks assessed/performed             Past Medical History:  Diagnosis Date   Abdominal hernia    Allergic rhinitis    Arthritis    Bone spur of other site    left foot   Dry eyes    Family history of kidney cancer    Family history of prostate cancer    Hypertension    Ovarian cancer ([place])    Ovarian cancer on right ([place]) 06/17/2011   Peripheral neuropathy 08/26/2011   Chemotherapy-induced   Pulmonary embolism ([place])    Sinusitis     Past Surgical History:  Procedure Laterality Date   ABDOMINAL HYSTERECTOMY  05/25/2011   [place] bs&o and cancer staging   CHOLECYSTECTOMY  1990   COLONOSCOPY N/A 09/06/2014   Procedure: COLONOSCOPY;  Surgeon: Rogene Houston, MD;  [facility];  Service: Endoscopy;  Laterality: N/A;  930 - moved to 9/2 @ 8:25   [place]  10/15/2014   INCISIONAL HERNIA REPAIR  10/15/2014   Procedure: OPEN INCISIONAL HERNIA REPAIR WITH MESH AND MYOFASCIAL FLAPS;  Surgeon: Erroll Luna, MD;  Location: [place];  Service: General;;   IVC filter  05/24/11   PORT-A-CATH REMOVAL Right 11/21/2013   Procedure: MINOR REMOVAL PORT-A-CATH;  Surgeon: Jamesetta So, MD;  Location: [facility];  Service: General;  Laterality: Right;   PORTACATH  PLACEMENT  06/16/11   Done at [facility]     age 81's   TOTAL HIP ARTHROPLASTY Right 08/26/2020   Procedure: TOTAL HIP ARTHROPLASTY ANTERIOR APPROACH;  Surgeon: Renette Butters, MD;  [facility];  Service: Orthopedics;  Laterality: Right;   TUBAL LIGATION  1981    There were no vitals filed for this visit.   Subjective Assessment - 10/22/20 1004     Subjective Patient states she sore yesterday and today.    Currently in Pain? No/denies                               [facility] Adult PT Treatment/Exercise - 10/22/20 0001       Knee/Hip Exercises: Standing   Heel Raises Both;20 reps    Heel Raises Limitations TR 20x    Knee Flexion Both;2 sets;10 reps;Limitations    Knee Flexion Limitations 3#    Hip Flexion Both;2 sets;10 reps;Limitations    Hip Flexion Limitations standing march 3#with HHA    Hip Abduction Both;2 sets;10 reps;Limitations    Abduction Limitations 3#    Lateral Step Up Left;Step Height: 4";Right;Step Height: 2";10 reps;Hand Hold: 1  Forward Step Up Right;2 sets;10 reps;Hand Hold: 2;Step Height: 4"    Other Standing Knee Exercises step taps 6 inch step 2x 10 bilateral cueing for flexion vs circumduction      Knee/Hip Exercises: Seated   Sit to Sand 10 reps;without UE support;2 sets   back foam on seat                    PT Education - 10/22/20 1004     Education Details HEP, exercise technique    Person(s) Educated Patient    Methods Explanation    Comprehension Verbalized understanding              PT Short Term Goals - 10/20/20 1003       PT SHORT TERM GOAL #1   Title Patient will be independent with HEP in order to improve functional outcomes.    Time 4    Period Weeks    Status Achieved    Target Date 09/25/20      PT SHORT TERM GOAL #2   Title Patient will report at least 25% improvement in symptoms for improved quality of life.    Time 4    Period Weeks    Status Achieved     Target Date 09/25/20               PT Long Term Goals - 10/20/20 1004       PT LONG TERM GOAL #1   Title Patient will report at least 75% improvement in symptoms for improved quality of life.    Time 8    Period Weeks    Status Achieved      PT LONG TERM GOAL #2   Title Patient will improve FOTO score by at least 25 points in order to indicate improved tolerance to activity.    Time 8    Period Weeks    Status Achieved      PT LONG TERM GOAL #3   Title Patient will be able to complete 5x STS in under 11.4 seconds in order to reduce the risk of falls.    Time 8    Period Weeks    Status On-going      PT LONG TERM GOAL #4   Title Patient will be able to navigate stairs with reciprocal pattern without compensation in order to demonstrate improved LE strength.    Time 8    Period Weeks    Status On-going                   Plan - 10/22/20 1004     Clinical Impression Statement Patient given cueing throughout session for decreasing UE support as able and exercise mechanics. She continues to demonstrate LE weakness with frequent compensatory strategies when fatigue. Tolerates increased weight but continues to show bilateral LE weakness, R>L. Patient unable to coordinate lateral step up/down with knee/hip flexion and utilizes weight shift despite multimodal cueing.  Patient will continue to benefit from skilled physical therapy in order to reduce impairment and improve function.    Personal Factors and Comorbidities Age;Fitness;Comorbidity 1    Comorbidities neuropathy    Examination-Activity Limitations Locomotion Level;Transfers;Bed Mobility;Bend;Squat;Stairs;Stand;Lift;Dressing;Hygiene/Grooming    Examination-Participation Restrictions Meal Prep;Cleaning;Shop;Volunteer;Yard Work;Laundry;Driving    Stability/Clinical Decision Making Stable/Uncomplicated    Rehab Potential Good    PT Frequency 2x / week    PT Duration 8 weeks    PT Treatment/Interventions ADLs/Self  Care Home Management;Aquatic Therapy;Electrical Stimulation;Iontophoresis 4mg /ml Dexamethasone;Traction;Moist Heat;Ultrasound;DME Instruction;Gait  training;Stair training;Functional mobility training;Therapeutic activities;Therapeutic exercise;Balance training;Neuromuscular re-education;Patient/family education;Manual techniques;Manual lymph drainage;Orthotic Fit/Training;Compression bandaging;Scar mobilization;Passive range of motion;Dry needling;Energy conservation;Splinting;Taping    PT Next Visit Plan hip strengthening, gait and balance training.  Progress functional strength with reduced UE assist    PT Home Exercise Plan exercises given at hospital 9/14 vector stance  9/22: LAQ    Consulted and Agree with Plan of Care Patient             Patient will benefit from skilled therapeutic intervention in order to improve the following deficits and impairments:  Abnormal gait, Difficulty walking, Decreased range of motion, Increased muscle spasms, Pain, Decreased activity tolerance, Decreased balance, Impaired flexibility, Improper body mechanics, Decreased mobility, Decreased strength  Visit Diagnosis: Pain in right hip  Muscle weakness (generalized)  Other abnormalities of gait and mobility  Other symptoms and signs involving the musculoskeletal system     Problem List Patient Active Problem List   Diagnosis Date Noted   S/P total right hip arthroplasty 08/26/2020   Vaginal Pap smear following hysterectomy for malignancy 03/02/2017   Screening for colorectal cancer 03/02/2017   Vaginal atrophy 03/02/2017   History of ovarian cancer 03/02/2017   Genetic testing 05/29/2015   Family history of kidney cancer    Family history of prostate cancer    Chemotherapy-induced neuropathy ([place]) 03/31/2015   Incisional hernia, incarcerated 10/15/2014   Ventral hernia 07/15/2014   Ovarian cancer on right ([place]) 06/17/2011   Hypertension 06/17/2011   H/O hysterectomy with oophorectomy  06/17/2011    10:39 AM, 10/22/20 Mearl Latin PT, DPT Physical Therapist at [facility] [address] [place], [place], [address] Phone: [phone]   Fax:  [phone]  Name: Sarah Walker MRN: [id number] Date of Birth: 1950-11-02

## 2020-10-27 ENCOUNTER — Encounter (HOSPITAL_COMMUNITY): Payer: Self-pay | Admitting: Physical Therapy

## 2020-10-27 ENCOUNTER — Ambulatory Visit (HOSPITAL_COMMUNITY): Payer: Medicare Other | Admitting: Physical Therapy

## 2020-10-27 ENCOUNTER — Other Ambulatory Visit: Payer: Self-pay

## 2020-10-27 DIAGNOSIS — M6281 Muscle weakness (generalized): Secondary | ICD-10-CM | POA: Diagnosis not present

## 2020-10-27 DIAGNOSIS — R2689 Other abnormalities of gait and mobility: Secondary | ICD-10-CM | POA: Diagnosis not present

## 2020-10-27 DIAGNOSIS — M25551 Pain in right hip: Secondary | ICD-10-CM

## 2020-10-27 DIAGNOSIS — R29898 Other symptoms and signs involving the musculoskeletal system: Secondary | ICD-10-CM | POA: Diagnosis not present

## 2020-10-27 NOTE — Therapy (Signed)
[place], [place], [address] Phone: [phone]   Fax:  [phone]  Physical Therapy Treatment  Patient Details  Name: Sarah Walker MRN: [id number] Date of Birth: 06-24-1950 Referring Provider (PT): Edmonia Lynch MD   Encounter Date: 10/27/2020   PT End of Session - 10/27/20 1004     Visit Number 9    Number of Visits 24    Date for PT Re-Evaluation 11/17/20    Authorization Type [employer] medicare [employer] [facility] (no VL, no auth)    Progress Note Due on Visit 18    PT Start Time 1003    PT Stop Time 1041    PT Time Calculation (min) 38 min    Activity Tolerance Patient tolerated treatment well    Behavior During Therapy [facility] for tasks assessed/performed             Past Medical History:  Diagnosis Date   Abdominal hernia    Allergic rhinitis    Arthritis    Bone spur of other site    left foot   Dry eyes    Family history of kidney cancer    Family history of prostate cancer    Hypertension    Ovarian cancer ([place])    Ovarian cancer on right ([place]) 06/17/2011   Peripheral neuropathy 08/26/2011   Chemotherapy-induced   Pulmonary embolism ([place])    Sinusitis     Past Surgical History:  Procedure Laterality Date   ABDOMINAL HYSTERECTOMY  05/25/2011   [place] bs&o and cancer staging   CHOLECYSTECTOMY  1990   COLONOSCOPY N/A 09/06/2014   Procedure: COLONOSCOPY;  Surgeon: Rogene Houston, MD;  [facility];  Service: Endoscopy;  Laterality: N/A;  930 - moved to 9/2 @ 8:25   [place]  10/15/2014   INCISIONAL HERNIA REPAIR  10/15/2014   Procedure: OPEN INCISIONAL HERNIA REPAIR WITH MESH AND MYOFASCIAL FLAPS;  Surgeon: Erroll Luna, MD;  Location: [facility];  Service: General;;   IVC filter  05/24/11   PORT-A-CATH REMOVAL Right 11/21/2013   Procedure: MINOR REMOVAL PORT-A-CATH;  Surgeon: Jamesetta So, MD;  [facility];  Service: General;  Laterality: Right;   PORTACATH  PLACEMENT  06/16/11   Done at [place]     age 12's   TOTAL HIP ARTHROPLASTY Right 08/26/2020   Procedure: TOTAL HIP ARTHROPLASTY ANTERIOR APPROACH;  Surgeon: Renette Butters, MD;  [facility];  Service: Orthopedics;  Laterality: Right;   TUBAL LIGATION  1981    There were no vitals filed for this visit.   Subjective Assessment - 10/27/20 1018     Subjective Reports she is stiff today as she did a lot yesterday. No reports of pain just stiffness and soreness. States she took a muscle relaxer right before she came here.    Currently in Pain? No/denies                [facility] PT Assessment - 10/27/20 0001       Assessment   Medical Diagnosis R anterior THA    Referring Provider (PT) Edmonia Lynch MD    Onset Date/Surgical Date 08/26/20    Next MD Visit --   6 week follow                          [facility] Adult PT Treatment/Exercise - 10/27/20 0001  Bed Mobility   Bed Mobility --   sit to supine to prone, back to sitting - min assist - set up assist     Ambulation/Gait   Ambulation/Gait Yes    Ambulation/Gait Assistance 6: Modified independent (Device/Increase time);5: Supervision    Ambulation Distance (Feet) 226 Feet    Assistive device Straight cane    Gait Pattern Antalgic   right knee hyperextends   Ambulation Surface Level;Indoor    Gait Comments , then practiced wtih small base quad cane - more stable      Knee/Hip Exercises: Prone   Hamstring Curl 3 sets;10 reps;3 seconds   B, pillows under abdomen for comfort   Other Prone Exercises quad isometric 2x15 5" holds B                       PT Short Term Goals - 10/20/20 1003       PT SHORT TERM GOAL #1   Title Patient will be independent with HEP in order to improve functional outcomes.    Time 4    Period Weeks    Status Achieved    Target Date 09/25/20      PT SHORT TERM GOAL #2   Title Patient will report at least 25% improvement in symptoms  for improved quality of life.    Time 4    Period Weeks    Status Achieved    Target Date 09/25/20               PT Long Term Goals - 10/20/20 1004       PT LONG TERM GOAL #1   Title Patient will report at least 75% improvement in symptoms for improved quality of life.    Time 8    Period Weeks    Status Achieved      PT LONG TERM GOAL #2   Title Patient will improve FOTO score by at least 25 points in order to indicate improved tolerance to activity.    Time 8    Period Weeks    Status Achieved      PT LONG TERM GOAL #3   Title Patient will be able to complete 5x STS in under 11.4 seconds in order to reduce the risk of falls.    Time 8    Period Weeks    Status On-going      PT LONG TERM GOAL #4   Title Patient will be able to navigate stairs with reciprocal pattern without compensation in order to demonstrate improved LE strength.    Time 8    Period Weeks    Status On-going                   Plan - 10/27/20 1138     Clinical Impression Statement Patient with reports of stiffness and tightness along thigh and hip today. Trialed prone position and patient initially apprehensive in getting into this position. Reassured patient and explained rational for exercises. Required min assist to get in and out of prone position but once in position, patient tolerated position well with pillow under abdomen. Performed prone exercises. Then trialed use of quad cane for more support. Reduced pain and stiffness noted end of session. Will continue with current POC as tolerated.    Personal Factors and Comorbidities Age;Fitness;Comorbidity 1    Comorbidities neuropathy    Examination-Activity Limitations Locomotion Level;Transfers;Bed Mobility;Bend;Squat;Stairs;Stand;Lift;Dressing;Hygiene/Grooming    Examination-Participation Restrictions Meal Prep;Cleaning;Shop;Volunteer;Yard Work;Laundry;Driving    Stability/Clinical  Decision Making Stable/Uncomplicated    Rehab  Potential Good    PT Frequency 2x / week    PT Duration 8 weeks    PT Treatment/Interventions ADLs/Self Care Home Management;Aquatic Therapy;Electrical Stimulation;Iontophoresis 4mg /ml Dexamethasone;Traction;Moist Heat;Ultrasound;DME Instruction;Gait training;Stair training;Functional mobility training;Therapeutic activities;Therapeutic exercise;Balance training;Neuromuscular re-education;Patient/family education;Manual techniques;Manual lymph drainage;Orthotic Fit/Training;Compression bandaging;Scar mobilization;Passive range of motion;Dry needling;Energy conservation;Splinting;Taping    PT Next Visit Plan hip strengthening, gait and balance training.  Progress functional strength with reduced UE assist    PT Home Exercise Plan exercises given at hospital 9/14 vector stance  9/22: LAQ    Consulted and Agree with Plan of Care Patient             Patient will benefit from skilled therapeutic intervention in order to improve the following deficits and impairments:  Abnormal gait, Difficulty walking, Decreased range of motion, Increased muscle spasms, Pain, Decreased activity tolerance, Decreased balance, Impaired flexibility, Improper body mechanics, Decreased mobility, Decreased strength  Visit Diagnosis: Pain in right hip  Muscle weakness (generalized)  Other abnormalities of gait and mobility     Problem List Patient Active Problem List   Diagnosis Date Noted   S/P total right hip arthroplasty 08/26/2020   Vaginal Pap smear following hysterectomy for malignancy 03/02/2017   Screening for colorectal cancer 03/02/2017   Vaginal atrophy 03/02/2017   History of ovarian cancer 03/02/2017   Genetic testing 05/29/2015   Family history of kidney cancer    Family history of prostate cancer    Chemotherapy-induced neuropathy ([place]) 03/31/2015   Incisional hernia, incarcerated 10/15/2014   Ventral hernia 07/15/2014   Ovarian cancer on right [facility]) 06/17/2011   Hypertension 06/17/2011    H/O hysterectomy with oophorectomy 06/17/2011  11:41 AM, 10/27/20 Jerene Pitch, DPT Physical Therapy with [facility]  [phone] office   [place] [address] [place], [place], [address] Phone: [phone]   Fax:  [phone]  Name: Sarah Walker MRN: [id number] Date of Birth: 19-Jun-1950

## 2020-10-29 ENCOUNTER — Encounter (HOSPITAL_COMMUNITY): Payer: Medicare Other | Admitting: Physical Therapy

## 2020-11-03 ENCOUNTER — Other Ambulatory Visit: Payer: Self-pay

## 2020-11-03 ENCOUNTER — Ambulatory Visit (HOSPITAL_COMMUNITY): Payer: Medicare Other | Admitting: Physical Therapy

## 2020-11-03 ENCOUNTER — Encounter (HOSPITAL_COMMUNITY): Payer: Self-pay | Admitting: Physical Therapy

## 2020-11-03 DIAGNOSIS — R2689 Other abnormalities of gait and mobility: Secondary | ICD-10-CM

## 2020-11-03 DIAGNOSIS — M6281 Muscle weakness (generalized): Secondary | ICD-10-CM

## 2020-11-03 DIAGNOSIS — M25551 Pain in right hip: Secondary | ICD-10-CM | POA: Diagnosis not present

## 2020-11-03 DIAGNOSIS — R29898 Other symptoms and signs involving the musculoskeletal system: Secondary | ICD-10-CM | POA: Diagnosis not present

## 2020-11-03 NOTE — Patient Instructions (Signed)
Access Code: [email or web address] URL: [email or web address] Date: 11/03/2020 Prepared by: Josue Hector  Exercises Sit to Stand with Counter Support - 1-2 x daily - 4-5 x weekly - 2 sets - 10 reps Standing March with Counter Support - 1-2 x daily - 4-5 x weekly - 2 sets - 10 reps Heel Raises with Counter Support - 1-2 x daily - 4-5 x weekly - 2 sets - 10 reps Standing Tandem Balance with Counter Support - 1-2 x daily - 4-5 x weekly - 1 sets - 3 reps - 20 second hold Side Stepping with Counter Support - 1-2 x daily - 4-5 x weekly - 1 sets - 10 reps Forward Step Up with Counter Support - 1-2 x daily - 4-5 x weekly - 1 sets - 10 reps

## 2020-11-03 NOTE — Therapy (Signed)
[place], [place], [address] Phone: [phone]   Fax:  [phone]  Physical Therapy Treatment  Patient Details  Name: Sarah Walker MRN: [id number] Date of Birth: Oct 13, 1950 Referring Provider (PT): Edmonia Lynch MD  PHYSICAL THERAPY DISCHARGE SUMMARY  Visits from Start of Care: 10  Current functional level related to goals / functional outcomes: See below   Remaining deficits: See below   Education / Equipment: See assessment    Patient agrees to discharge. Patient goals were partially met. Patient is being discharged due to being pleased with the current functional level.   Encounter Date: 11/03/2020   PT End of Session - 11/03/20 1043     Visit Number 10    Number of Visits 24    Date for PT Re-Evaluation 11/17/20    Authorization Type [employer] medicare [facility] (no VL, no auth)    Progress Note Due on Visit 18    PT Start Time 1036    PT Stop Time 1116    PT Time Calculation (min) 40 min    Activity Tolerance Patient tolerated treatment well    Behavior During Therapy WFL for tasks assessed/performed             Past Medical History:  Diagnosis Date   Abdominal hernia    Allergic rhinitis    Arthritis    Bone spur of other site    left foot   Dry eyes    Family history of kidney cancer    Family history of prostate cancer    Hypertension    Ovarian cancer ([place])    Ovarian cancer on right ([place]) 06/17/2011   Peripheral neuropathy 08/26/2011   Chemotherapy-induced   Pulmonary embolism ([facility])    Sinusitis     Past Surgical History:  Procedure Laterality Date   ABDOMINAL HYSTERECTOMY  05/25/2011   [place] bs&o and cancer staging   CHOLECYSTECTOMY  1990   COLONOSCOPY N/A 09/06/2014   Procedure: COLONOSCOPY;  Surgeon: Rogene Houston, MD;  [facility];  Service: Endoscopy;  Laterality: N/A;  930 - moved to 9/2 @ 8:25   [place]  10/15/2014    INCISIONAL HERNIA REPAIR  10/15/2014   Procedure: OPEN INCISIONAL HERNIA REPAIR WITH MESH AND MYOFASCIAL FLAPS;  Surgeon: Erroll Luna, MD;  Location: [facility];  Service: General;;   IVC filter  05/24/11   PORT-A-CATH REMOVAL Right 11/21/2013   Procedure: MINOR REMOVAL PORT-A-CATH;  Surgeon: Jamesetta So, MD;  Location: [facility];  Service: General;  Laterality: Right;   PORTACATH PLACEMENT  06/16/11   Done at [place]     age 17's   TOTAL HIP ARTHROPLASTY Right 08/26/2020   Procedure: TOTAL HIP ARTHROPLASTY ANTERIOR APPROACH;  Surgeon: Renette Butters, MD;  Location: [facility];  Service: Orthopedics;  Laterality: Right;   TUBAL LIGATION  1981    There were no vitals filed for this visit.   Subjective Assessment - 11/03/20 1043     Subjective Patient states she is doing well. She has been doing exercise and some that she has found on YouTube. She feels these have been helpful. She still has some trouble coming down steps "the normal way", but says she had not done this for some time prior to hip surgery. Patient says she may like to make today her last visit to continue with therapy exercises at home. She reports 95% improvement overall since starting  therapy.    Limitations Lifting;Standing;Walking;House hold activities    Currently in Pain? No/denies                [facility] PT Assessment - 11/03/20 0001       Assessment   Medical Diagnosis R anterior THA    Referring Provider (PT) Edmonia Lynch MD    Onset Date/Surgical Date 08/26/20    Next MD Visit December    Prior Therapy yes shoulder      Precautions   Precautions None      Restrictions   Weight Bearing Restrictions No      Balance Screen   Has the patient fallen in the past 6 months No      [place] residence    Living Arrangements Spouse/significant other      Prior Function   Level of Independence Independent      Cognition   Overall Cognitive Status  Within Functional Limits for tasks assessed      Observation/Other Assessments   Focus on Therapeutic Outcomes (FOTO)  72% function      Strength   Right Hip Flexion 3+/5    Left Hip Flexion 4+/5    Right Knee Extension 3+/5    Left Knee Extension 4+/5    Right Ankle Dorsiflexion 4+/5    Left Ankle Dorsiflexion 4+/5      Transfers   Five time sit to stand comments  12.3 sec with no UE   was 17.27 sec     Ambulation/Gait   Ambulation/Gait Yes    Ambulation/Gait Assistance 6: Modified independent (Device/Increase time);5: Supervision    Assistive device Straight cane    Gait Pattern Decreased stance time - right    Ambulation Surface Level;Indoor    Gait velocity decreased    Stairs Yes    Stairs Assistance 6: Modified independent (Device/Increase time)    Stair Management Technique Two rails;Step to pattern    Number of Stairs 8    Height of Stairs 7                           [facility] Adult PT Treatment/Exercise - 11/03/20 0001       Knee/Hip Exercises: Standing   Other Standing Knee Exercises sit to stand 2 x 5, heel raise x10, tandem stance x20" each, step ups x 5 on 4 inch then 7 inch                       PT Short Term Goals - 10/20/20 1003       PT SHORT TERM GOAL #1   Title Patient will be independent with HEP in order to improve functional outcomes.    Time 4    Period Weeks    Status Achieved    Target Date 09/25/20      PT SHORT TERM GOAL #2   Title Patient will report at least 25% improvement in symptoms for improved quality of life.    Time 4    Period Weeks    Status Achieved    Target Date 09/25/20               PT Long Term Goals - 11/03/20 1103       PT LONG TERM GOAL #1   Title Patient will report at least 75% improvement in symptoms for improved quality of life.    Baseline Reports 95%  Time 8    Period Weeks    Status Achieved      PT LONG TERM GOAL #2   Title Patient will improve FOTO score by at  least 25 points in order to indicate improved tolerance to activity.    Baseline See FOTO    Time 8    Period Weeks    Status Achieved      PT LONG TERM GOAL #3   Title Patient will be able to complete 5x STS in under 11.4 seconds in order to reduce the risk of falls.    Baseline 12. 3 sec    Time 8    Period Weeks    Status Not Met      PT LONG TERM GOAL #4   Title Patient will be able to navigate stairs with reciprocal pattern without compensation in order to demonstrate improved LE strength.    Baseline Step to pattern with rails for max safety per ongoing RT quad weakness    Time 8    Period Weeks    Status Not Met                   Plan - 11/03/20 1114     Clinical Impression Statement Patient shows good progress overall. Improved gait and functional strength as evidence by sit to stand x 5 testing. Remains limited by RT hip and quad weakness. She reports consistent performance of HEP and would like to continue with home exercise independent. Reviewed stair navigation and current HEP. Discussed HEP progressions with addition of balance and practiced in clinic. Reviewed comprehensive HEP and issued handout. Answered all patient questions. Patient will be DC today with goals partially met. Patient encouraged to follow up with therapy services with any further questions or concerns.    Personal Factors and Comorbidities Age;Fitness;Comorbidity 1    Comorbidities neuropathy    Examination-Activity Limitations Locomotion Level;Transfers;Bed Mobility;Bend;Squat;Stairs;Stand;Lift;Dressing;Hygiene/Grooming    Examination-Participation Restrictions Meal Prep;Cleaning;Shop;Volunteer;Yard Work;Laundry;Driving    Stability/Clinical Decision Making Stable/Uncomplicated    Rehab Potential Good    PT Treatment/Interventions ADLs/Self Care Home Management;Aquatic Therapy;Electrical Stimulation;Iontophoresis 56m/ml Dexamethasone;Traction;Moist Heat;Ultrasound;DME Instruction;Gait  training;Stair training;Functional mobility training;Therapeutic activities;Therapeutic exercise;Balance training;Neuromuscular re-education;Patient/family education;Manual techniques;Manual lymph drainage;Orthotic Fit/Training;Compression bandaging;Scar mobilization;Passive range of motion;Dry needling;Energy conservation;Splinting;Taping    PT Next Visit Plan DC to HEP    PT Home Exercise Plan exercises given at hospital 9/14 vector stance  9/22: LAQ    Consulted and Agree with Plan of Care Patient             Patient will benefit from skilled therapeutic intervention in order to improve the following deficits and impairments:  Abnormal gait, Difficulty walking, Decreased range of motion, Increased muscle spasms, Pain, Decreased activity tolerance, Decreased balance, Impaired flexibility, Improper body mechanics, Decreased mobility, Decreased strength  Visit Diagnosis: Pain in right hip  Muscle weakness (generalized)  Other abnormalities of gait and mobility     Problem List Patient Active Problem List   Diagnosis Date Noted   S/P total right hip arthroplasty 08/26/2020   Vaginal Pap smear following hysterectomy for malignancy 03/02/2017   Screening for colorectal cancer 03/02/2017   Vaginal atrophy 03/02/2017   History of ovarian cancer 03/02/2017   Genetic testing 05/29/2015   Family history of kidney cancer    Family history of prostate cancer    [place] 03/31/2015   Incisional hernia, incarcerated 10/15/2014   Ventral hernia 07/15/2014   [place] 06/17[place]   Hypertension 06/17/2011  H/O hysterectomy with oophorectomy 06/17/2011   11:20 AM, 11/03/20 Josue Hector PT DPT  Physical Therapist with [facility]  [phone] [place], [place], [address] Phone: [phone]   Fax:  [phone]  Name: Sarah Walker MRN:  [id number] Date of Birth: September 19, 1950

## 2020-11-05 ENCOUNTER — Encounter (HOSPITAL_COMMUNITY): Payer: Medicare Other | Admitting: Physical Therapy

## 2020-11-10 ENCOUNTER — Encounter (HOSPITAL_COMMUNITY): Payer: Medicare Other | Admitting: Physical Therapy

## 2020-11-12 ENCOUNTER — Encounter (HOSPITAL_COMMUNITY): Payer: Medicare Other | Admitting: Physical Therapy

## 2020-11-17 ENCOUNTER — Encounter (HOSPITAL_COMMUNITY): Payer: Medicare Other | Admitting: Physical Therapy

## 2020-11-17 ENCOUNTER — Other Ambulatory Visit (HOSPITAL_COMMUNITY): Payer: Self-pay | Admitting: *Deleted

## 2020-11-17 DIAGNOSIS — C561 Malignant neoplasm of right ovary: Secondary | ICD-10-CM

## 2020-11-17 DIAGNOSIS — E875 Hyperkalemia: Secondary | ICD-10-CM

## 2020-11-18 ENCOUNTER — Inpatient Hospital Stay (HOSPITAL_COMMUNITY): Payer: Medicare Other | Attending: Hematology

## 2020-11-18 ENCOUNTER — Other Ambulatory Visit: Payer: Self-pay

## 2020-11-18 DIAGNOSIS — E875 Hyperkalemia: Secondary | ICD-10-CM

## 2020-11-18 DIAGNOSIS — C561 Malignant neoplasm of right ovary: Secondary | ICD-10-CM

## 2020-11-18 DIAGNOSIS — Z8543 Personal history of malignant neoplasm of ovary: Secondary | ICD-10-CM | POA: Insufficient documentation

## 2020-11-18 LAB — CBC WITH DIFFERENTIAL/PLATELET
Abs Immature Granulocytes: 0.01 10*3/uL (ref 0.00–0.07)
Basophils Absolute: 0 10*3/uL (ref 0.0–0.1)
Basophils Relative: 1 %
Eosinophils Absolute: 0.4 10*3/uL (ref 0.0–0.5)
Eosinophils Relative: 7 %
HCT: 37.6 % (ref 36.0–46.0)
Hemoglobin: 11.9 g/dL — ABNORMAL LOW (ref 12.0–15.0)
Immature Granulocytes: 0 %
Lymphocytes Relative: 24 %
Lymphs Abs: 1.3 10*3/uL (ref 0.7–4.0)
MCH: 31.4 pg (ref 26.0–34.0)
MCHC: 31.6 g/dL (ref 30.0–36.0)
MCV: 99.2 fL (ref 80.0–100.0)
Monocytes Absolute: 0.6 10*3/uL (ref 0.1–1.0)
Monocytes Relative: 10 %
Neutro Abs: 3.2 10*3/uL (ref 1.7–7.7)
Neutrophils Relative %: 58 %
Platelets: 178 10*3/uL (ref 150–400)
RBC: 3.79 MIL/uL — ABNORMAL LOW (ref 3.87–5.11)
RDW: 15.2 % (ref 11.5–15.5)
WBC: 5.4 10*3/uL (ref 4.0–10.5)
nRBC: 0 % (ref 0.0–0.2)

## 2020-11-18 LAB — COMPREHENSIVE METABOLIC PANEL
ALT: 14 U/L (ref 0–44)
AST: 16 U/L (ref 15–41)
Albumin: 3.6 g/dL (ref 3.5–5.0)
Alkaline Phosphatase: 56 U/L (ref 38–126)
Anion gap: 5 (ref 5–15)
BUN: 31 mg/dL — ABNORMAL HIGH (ref 8–23)
CO2: 25 mmol/L (ref 22–32)
Calcium: 8.9 mg/dL (ref 8.9–10.3)
Chloride: 108 mmol/L (ref 98–111)
Creatinine, Ser: 1.22 mg/dL — ABNORMAL HIGH (ref 0.44–1.00)
GFR, Estimated: 47 mL/min — ABNORMAL LOW (ref >60)
Glucose, Bld: 96 mg/dL (ref 70–99)
Potassium: 4.7 mmol/L (ref 3.5–5.1)
Sodium: 138 mmol/L (ref 135–145)
Total Bilirubin: 0.7 mg/dL (ref 0.3–1.2)
Total Protein: 6.3 g/dL — ABNORMAL LOW (ref 6.5–8.1)

## 2020-11-19 ENCOUNTER — Encounter (HOSPITAL_COMMUNITY): Payer: Medicare Other | Admitting: Physical Therapy

## 2020-11-19 LAB — CA 125: Cancer Antigen (CA) 125: 14.3 U/mL (ref 0.0–38.1)

## 2020-11-25 ENCOUNTER — Ambulatory Visit (HOSPITAL_COMMUNITY): Payer: Medicare Other | Admitting: Hematology

## 2020-12-02 ENCOUNTER — Inpatient Hospital Stay (HOSPITAL_COMMUNITY): Payer: Medicare Other | Admitting: Hematology

## 2020-12-03 DIAGNOSIS — E782 Mixed hyperlipidemia: Secondary | ICD-10-CM | POA: Diagnosis not present

## 2020-12-03 DIAGNOSIS — I10 Essential (primary) hypertension: Secondary | ICD-10-CM | POA: Diagnosis not present

## 2020-12-03 NOTE — Progress Notes (Signed)
Virtual Visit via Telephone Note [facility]  I connected with Sarah Walker  on 12/04/20 at  9:30 AM by telephone and verified that I am speaking with the correct person using two identifiers.  Location: Patient: Home Provider: [facility]   I discussed the limitations, risks, security and privacy concerns of performing an evaluation and management service by telephone and the availability of in person appointments. I also discussed with the patient that there may be a patient responsible charge related to this service. The patient expressed understanding and agreed to proceed.   REASON FOR VISIT:  Follow-up for Ovarian Cancer    CURRENT THERAPY: Clinical surveillance  BRIEF ONCOLOGIC HISTORY:  Oncology History  Ovarian cancer on right [facility])  05/25/2011 Surgery   Deb[facility]king surgery at [facility] in [facility]   05/25/2011 Initial Diagnosis   Ovarian cancer   07/15/2011 - 11/04/2011 Chemotherapy   Carboplatin/Pacliatexel x 6 cycles   12/01/2011 Remission   CT scan negative for disease   05/15/2012 Imaging   CT abd/pelvis- Negative for mass or adenopathy.     CANCER STAGING: Cancer Staging  Ovarian cancer on right [facility]) Staging form: Ovary, AJCC 7th Edition - Clinical: Stage Ia - Signed by Baird Cancer, PA on 07/26/2011   INTERVAL HISTORY:  Ms. Sarah Walker, a 70 y.o. female, returns for routine follow-up of her ovarian cancer in remission. [place] was last evaluated on 11/22/2019 by NP Faythe Casa.   At today's visit, she reports feeling well.  No recent hospitalizations, new diagnoses, or changes in her baseline health status.  Her chief complaint today is worsening and neuropathy hands and feet, which she reports is worse since a surgery she had earlier this year.  She reports that she took a single dose of gabapentin several years ago, but that she did not like the way that it made her feel "out of it" and dizzy.  She had  discussed trying acupuncture with previous provider, but was not able to get the referral.  She has been hesitant to try Cymbalta because it is also used as an antidepressant, which she does not feel that she needs, but with further discussion she is willing to try it.  She denies any new onset abdominal pain or bloating.  She has not noted any new lumps or bumps.  She denies any changes in bowel or bladder habits.  She has no B symptoms such as fever, chills, night sweats, unintentional weight loss.  No current signs or symptoms of blood clots such as unilateral leg swelling, pleuritic chest pain, hemoptysis, or dyspnea.  She reports 80% energy and 100% appetite.  She is eating well to maintain a stable weight at this time.    OBSERVATIONS/OBJECTIVE: Review of Systems  Constitutional:  Negative for chills, diaphoresis, fever, malaise/fatigue and weight loss.  Respiratory:  Negative for cough and shortness of breath.   Cardiovascular:  Negative for chest pain and palpitations.  Gastrointestinal:  Negative for abdominal pain, blood in stool, melena, nausea and vomiting.  Genitourinary:  Positive for urgency.  Neurological:  Positive for tingling (Neuropathy in hands and feet). Negative for dizziness and headaches.    PHYSICAL EXAM (per limitations of virtual telephone visit): The patient is alert and oriented x 3, exhibiting adequate mentation, good mood, and ability to speak in full sentences and execute sound judgement.   ASSESSMENT & PLAN: 1.  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 - 6 cycles of carboplatin and paclitaxel from 07/15/2011 through 11/04/2011. - Her last pelvic exam with gynecologist was on 03/02/2017 - Denies any abdominal bloating or pains.  She has not noted any new lumps or bumps.  No changes in bowel or bladder habits.  No B symptoms.   - Most recent labs (11/18/2020) shows a stable CA125 at 14.3, within  normal limits. - Per NCCN guidelines, we will continue with annual visits with CA125 and physical exam.  Imaging only as clinically indicated based on symptoms or physical exam findings. - PLAN: Instructed patient to call her gynecologist to schedule annual pelvic exam. - RTC in 1 year for in person visit (recommend avoiding phone visits in this patient due to need for annual physical exam and follow-up of previous ovarian cancer) - Patient is aware of alarm symptoms that would prompt more immediate medical attention or sooner appointment.    2.  Chemotherapy-induced peripheral neuropathy: - She has chemotherapy-induced peripheral neuropathy with numbness in the feet and hands, worsened by standing or walking for length of time. - She has tried gabapentin in the past but could not tolerate it due to dizziness and altered mental status - Was sent for acupuncture after last visit, but she reported that the referral never came through - She has been hesitant to try Cymbalta because it is also used as an antidepressant, which she does not feel that she needs, but with further discussion she is willing to try it. - PLAN: Prescription for Cymbalta 20 mg daily sent to patient's pharmacy, have discussed common side effects and patient is agreeable to proceed.  We will also look into referral for acupuncture.  3.  History of pulmonary embolism, provoked - Patient had pulmonary embolism in May 2013, following the surgery at [facility] for her ovarian cancer - Considered provoked due to recent surgery and cancer at the time - She was treated with 12 months of Coumadin anticoagulation - No current signs or symptoms of recurrent VTE    FOLLOW UP INSTRUCTIONS: Patient instructed to make an appointment with her gynecologist for annual pelvic exam. Repeat labs (CA125, CBC, CMP) and RTC in 1 year Prescription for Cymbalta sent to pharmacy We try to obtain acupuncture referral    I discussed the assessment and  treatment plan with the patient. The patient was provided an opportunity to ask questions and all were answered. The patient agreed with the plan and demonstrated an understanding of the instructions.   The patient was advised to call back or seek an in-person evaluation if the symptoms worsen or if the condition fails to improve as anticipated.  I provided 21 minutes of non-face-to-face time during this encounter.   Harriett Rush, PA-C 12/04/2020 9:56 AM

## 2020-12-04 ENCOUNTER — Other Ambulatory Visit: Payer: Self-pay

## 2020-12-04 ENCOUNTER — Inpatient Hospital Stay (HOSPITAL_COMMUNITY): Payer: Medicare Other | Attending: Physician Assistant | Admitting: Physician Assistant

## 2020-12-04 DIAGNOSIS — G620 Drug-induced polyneuropathy: Secondary | ICD-10-CM

## 2020-12-04 DIAGNOSIS — C561 Malignant neoplasm of right ovary: Secondary | ICD-10-CM | POA: Diagnosis not present

## 2020-12-04 DIAGNOSIS — Z86711 Personal history of pulmonary embolism: Secondary | ICD-10-CM

## 2020-12-04 DIAGNOSIS — T451X5A Adverse effect of antineoplastic and immunosuppressive drugs, initial encounter: Secondary | ICD-10-CM | POA: Diagnosis not present

## 2020-12-04 MED ORDER — DULOXETINE HCL 20 MG PO CPEP: 20.0000 mg | ORAL_CAPSULE | Freq: Every day | ORAL | 3 refills | Status: DC

## 2020-12-05 DIAGNOSIS — M1611 Unilateral primary osteoarthritis, right hip: Secondary | ICD-10-CM | POA: Diagnosis not present

## 2020-12-14 ENCOUNTER — Other Ambulatory Visit (HOSPITAL_COMMUNITY): Payer: Self-pay | Admitting: Physician Assistant

## 2020-12-14 DIAGNOSIS — G620 Drug-induced polyneuropathy: Secondary | ICD-10-CM

## 2020-12-19 ENCOUNTER — Ambulatory Visit (INDEPENDENT_AMBULATORY_CARE_PROVIDER_SITE_OTHER): Payer: Medicare Other | Admitting: Podiatry

## 2020-12-19 ENCOUNTER — Other Ambulatory Visit: Payer: Self-pay

## 2020-12-19 ENCOUNTER — Ambulatory Visit (INDEPENDENT_AMBULATORY_CARE_PROVIDER_SITE_OTHER): Payer: Medicare Other

## 2020-12-19 ENCOUNTER — Encounter: Payer: Self-pay | Admitting: Podiatry

## 2020-12-19 DIAGNOSIS — M722 Plantar fascial fibromatosis: Secondary | ICD-10-CM

## 2020-12-19 MED ORDER — TRIAMCINOLONE ACETONIDE 10 MG/ML IJ SUSP: 20.0000 mg | Freq: Once | INTRAMUSCULAR | Status: DC

## 2020-12-19 MED ORDER — TRIAMCINOLONE ACETONIDE 10 MG/ML IJ SUSP: 10.0000 mg | Freq: Once | INTRAMUSCULAR | Status: DC

## 2020-12-20 NOTE — Progress Notes (Signed)
Subjective:   Patient ID: Sarah Walker, female   DOB: 70 y.o.   MRN: [id number]   HPI Patient presents with a reoccurrence of pain in the plantar heel region of both feet stating its been over the last several months   ROS      Objective:  Physical Exam  Neurovascular status intact acute discomfort right plantar fascia at the insertion of the tendon into the calcaneus     Assessment:  Flareup of acute plantar fasciitis bilateral heel     Plan:  H&P x-rays reviewed and went ahead today did sterile prep and reinjected the fascia 3 mg Kenalog 5 mg Xylocaine at insertion.  Gave instructions on reduced activity and if this were to get worse or not get better for extended period of time may require other treatment  X-rays do indicate there has been not an increase in the size of the spurs I did not note stress fracture or other pathology with moderate depression of the arch

## 2020-12-23 DIAGNOSIS — J329 Chronic sinusitis, unspecified: Secondary | ICD-10-CM | POA: Diagnosis not present

## 2020-12-23 DIAGNOSIS — I10 Essential (primary) hypertension: Secondary | ICD-10-CM | POA: Diagnosis not present

## 2020-12-30 DIAGNOSIS — J329 Chronic sinusitis, unspecified: Secondary | ICD-10-CM | POA: Diagnosis not present

## 2020-12-30 DIAGNOSIS — J069 Acute upper respiratory infection, unspecified: Secondary | ICD-10-CM | POA: Diagnosis not present

## 2021-01-05 IMAGING — MG DIGITAL SCREENING BILAT W/ TOMO W/ CAD
6 of 10 series · 6 of 30 positions shown · non-contrast
Comparison: Previous exam(s).

CLINICAL DATA: Screening.

EXAM:
DIGITAL SCREENING BILATERAL MAMMOGRAM WITH TOMO AND CAD

[R MLO synth-2D]
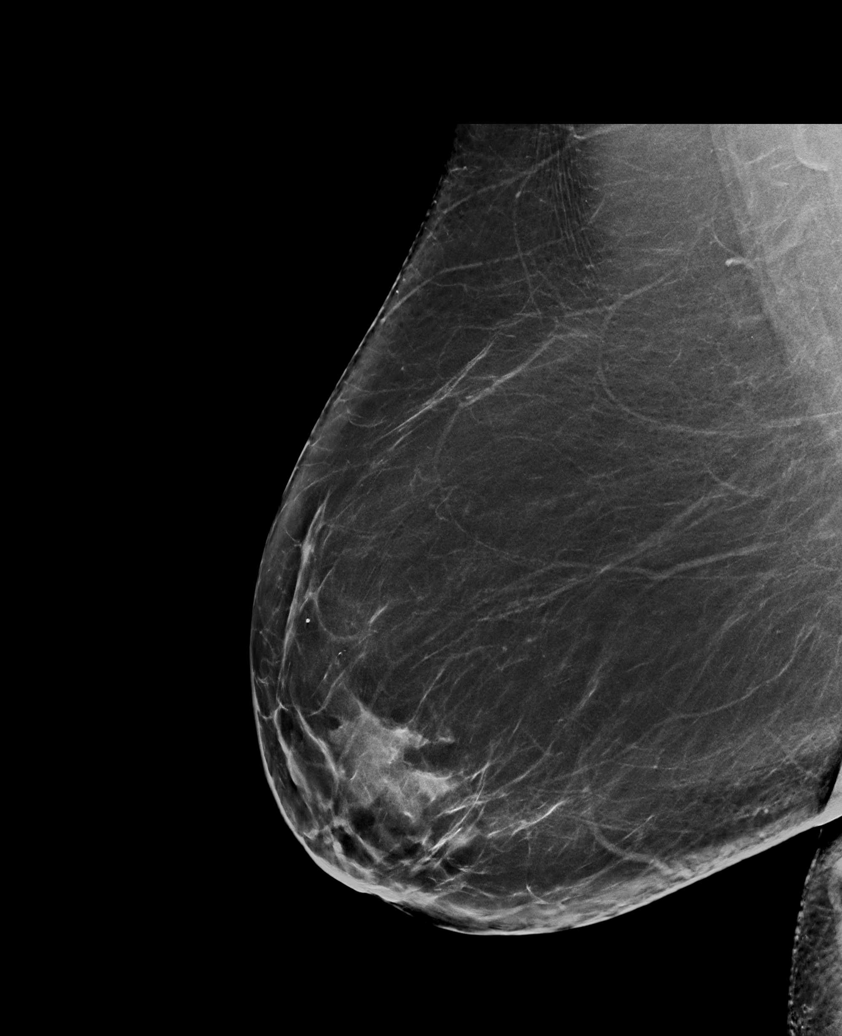

[L CC synth-2D]
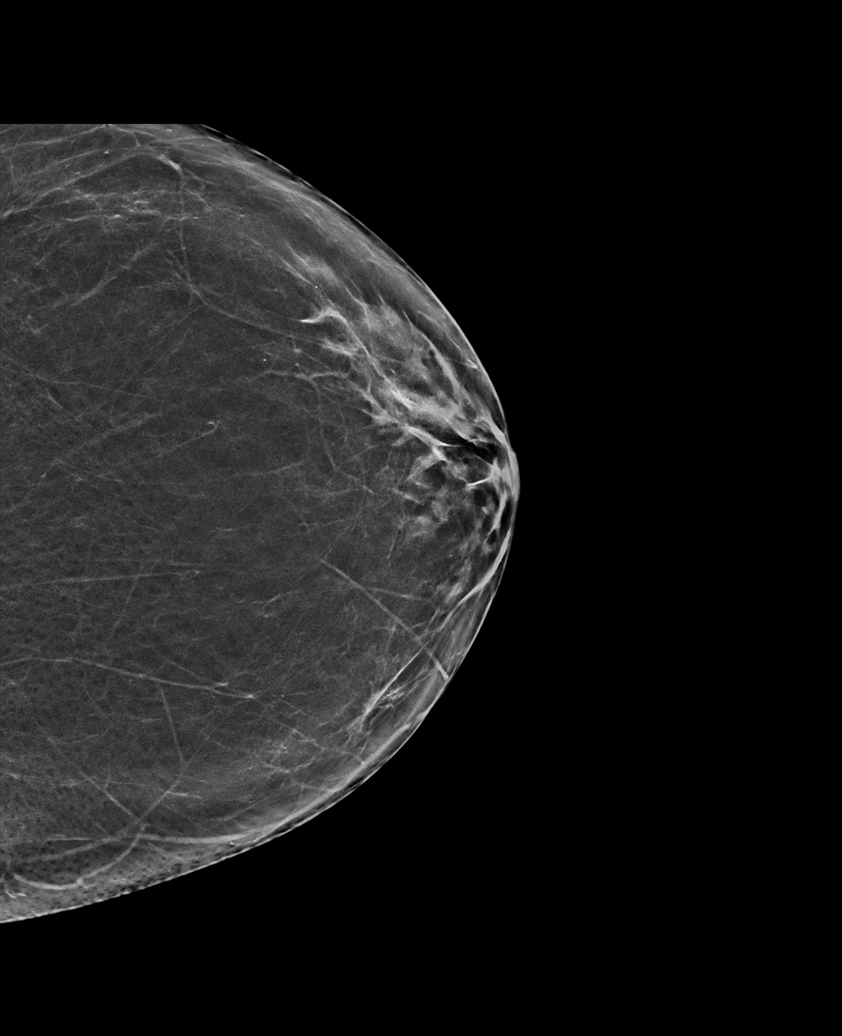

[L MLO synth-2D (1 of 2)]
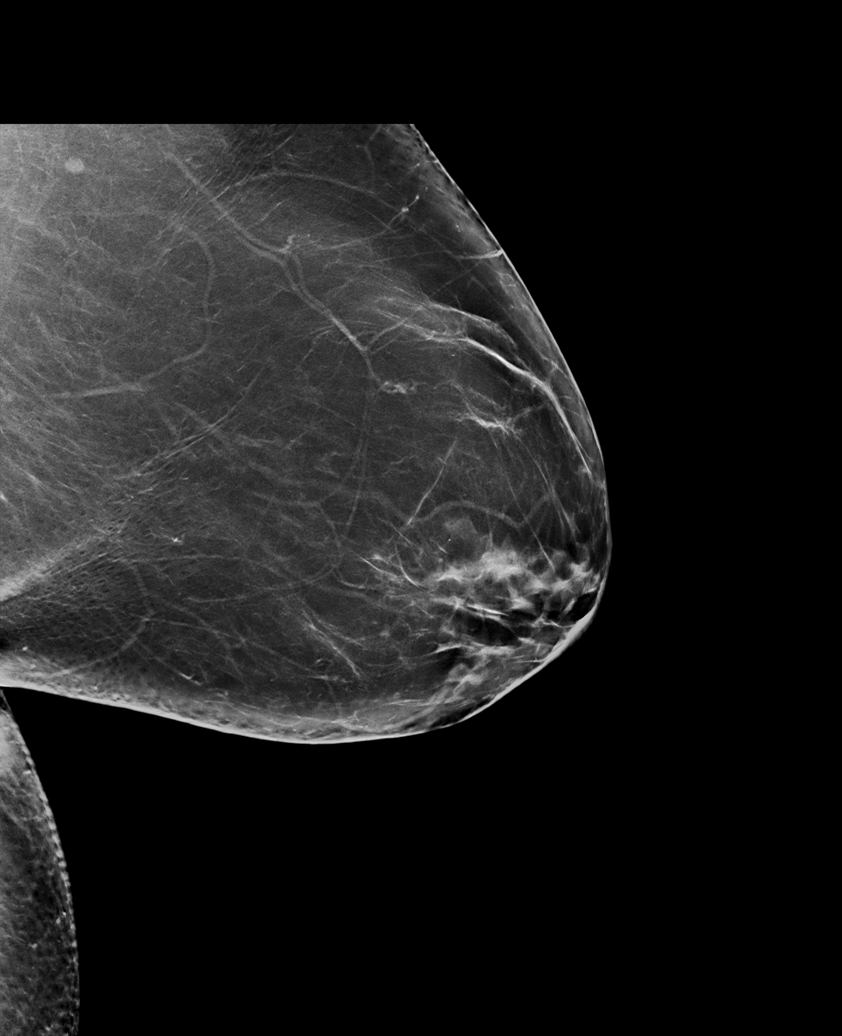

[R CC synth-2D]
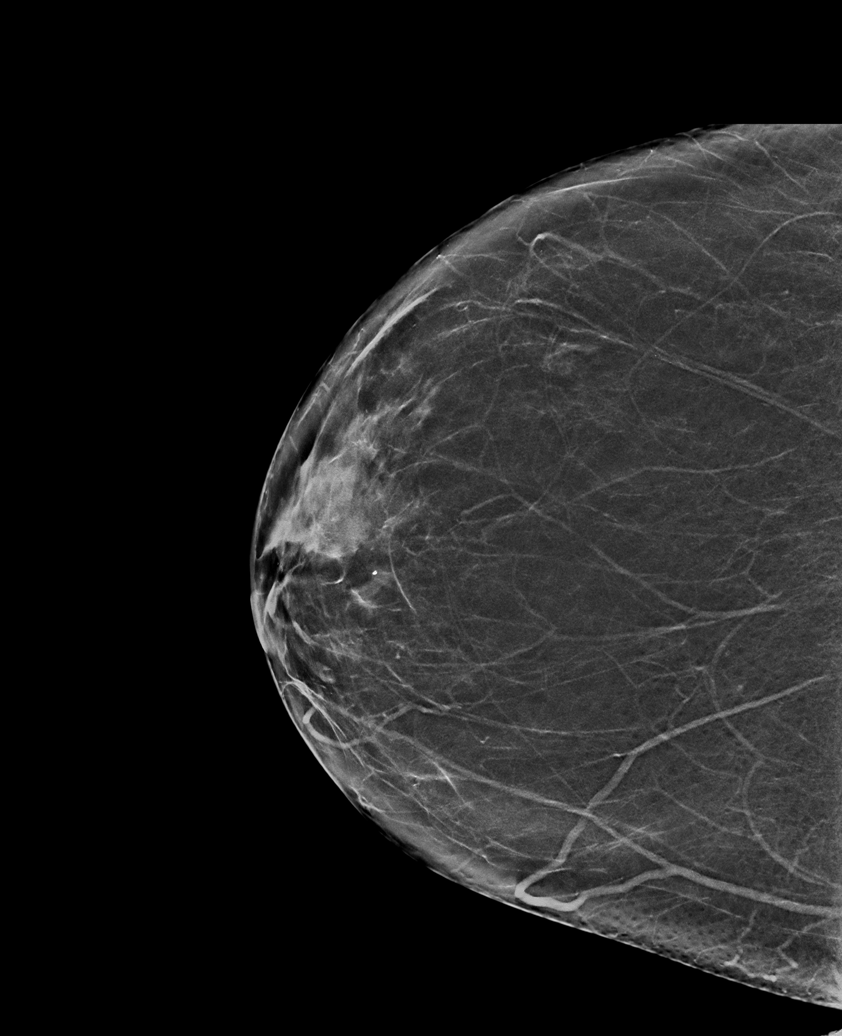

[L MLO synth-2D (2 of 2)]
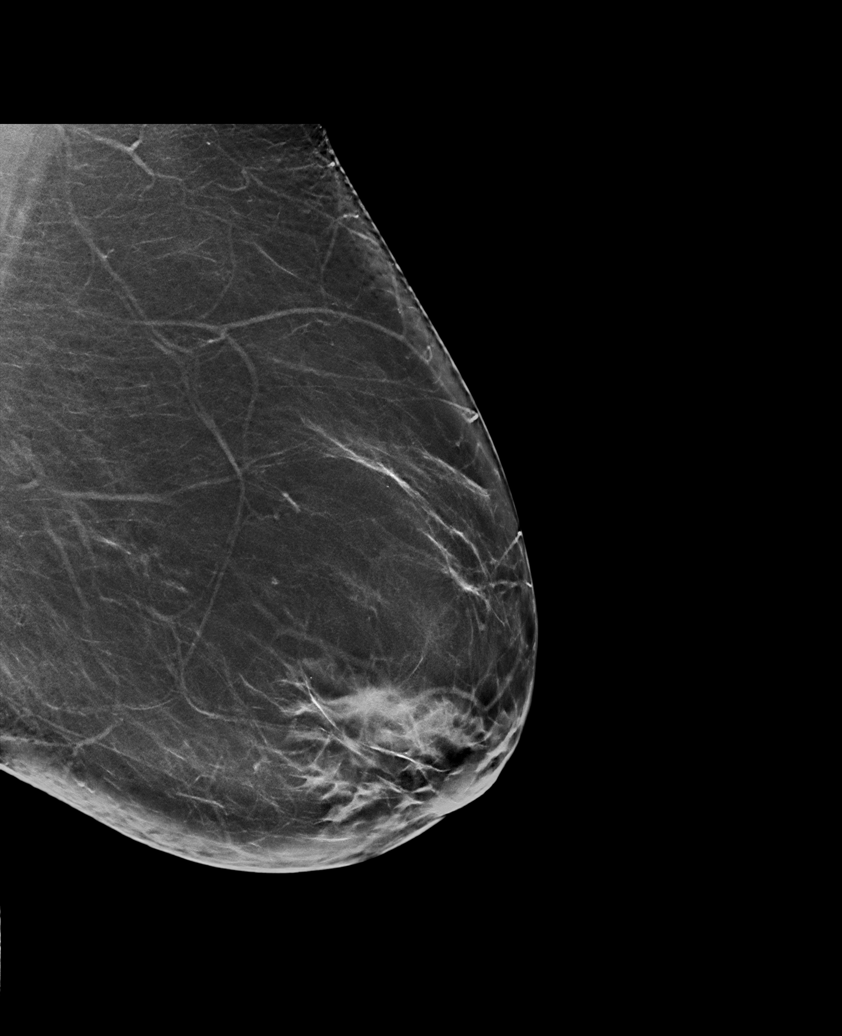

[R MLO tomo · tomo slice 43/85.0]
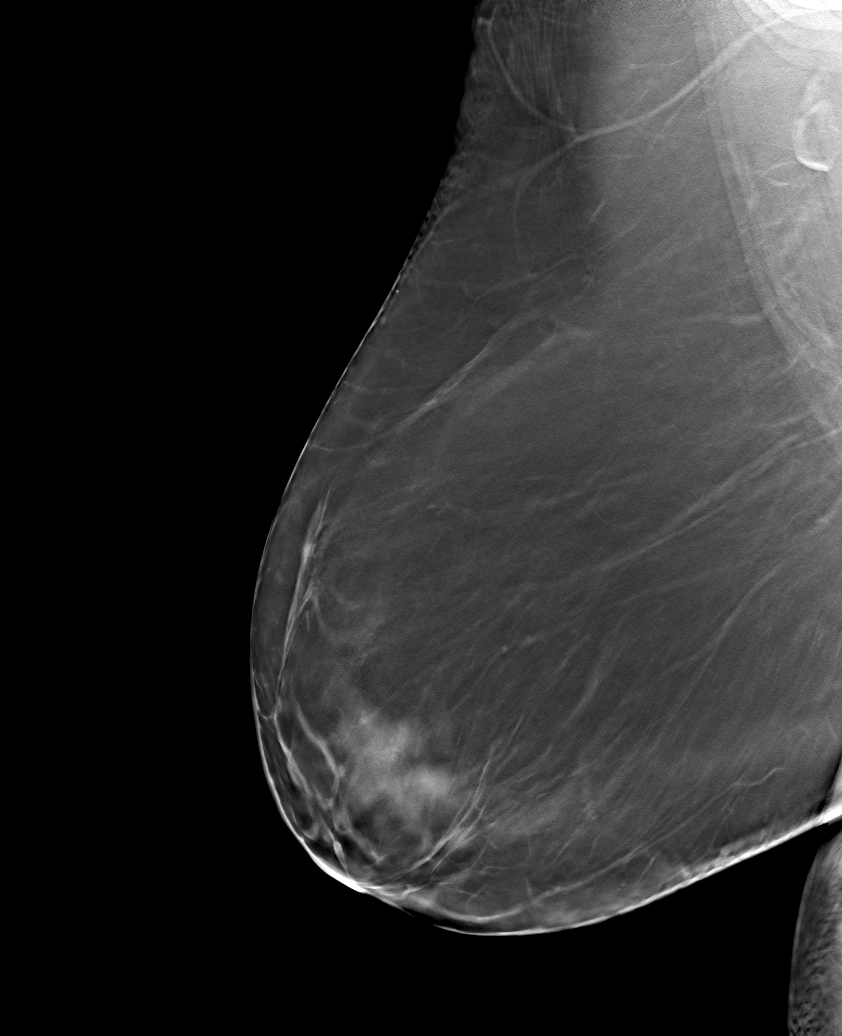

[6 of 30 positions shown; findings below may reference images not displayed]

ACR Breast Density Category b: There are scattered areas of
fibroglandular density.
FINDINGS: There are no findings suspicious for malignancy. Images were
processed with CAD.
IMPRESSION: No mammographic evidence of malignancy. A result letter of this
screening mammogram will be mailed directly to the patient.

RECOMMENDATION:
Screening mammogram in one year. (Code:[id number])

BI-RADS CATEGORY  1: Negative.

## 2021-02-03 DIAGNOSIS — I10 Essential (primary) hypertension: Secondary | ICD-10-CM | POA: Diagnosis not present

## 2021-02-03 DIAGNOSIS — E782 Mixed hyperlipidemia: Secondary | ICD-10-CM | POA: Diagnosis not present

## 2021-03-03 DIAGNOSIS — E782 Mixed hyperlipidemia: Secondary | ICD-10-CM | POA: Diagnosis not present

## 2021-03-03 DIAGNOSIS — I10 Essential (primary) hypertension: Secondary | ICD-10-CM | POA: Diagnosis not present

## 2021-03-10 DIAGNOSIS — B354 Tinea corporis: Secondary | ICD-10-CM | POA: Diagnosis not present

## 2021-03-10 DIAGNOSIS — L509 Urticaria, unspecified: Secondary | ICD-10-CM | POA: Diagnosis not present

## 2021-04-06 DIAGNOSIS — L209 Atopic dermatitis, unspecified: Secondary | ICD-10-CM | POA: Diagnosis not present

## 2021-05-04 DIAGNOSIS — I872 Venous insufficiency (chronic) (peripheral): Secondary | ICD-10-CM | POA: Diagnosis not present

## 2021-05-04 DIAGNOSIS — L308 Other specified dermatitis: Secondary | ICD-10-CM | POA: Diagnosis not present

## 2021-07-03 DIAGNOSIS — M1611 Unilateral primary osteoarthritis, right hip: Secondary | ICD-10-CM | POA: Diagnosis not present

## 2021-07-03 DIAGNOSIS — M25562 Pain in left knee: Secondary | ICD-10-CM | POA: Diagnosis not present

## 2021-07-03 DIAGNOSIS — M25561 Pain in right knee: Secondary | ICD-10-CM | POA: Diagnosis not present

## 2021-07-22 ENCOUNTER — Encounter: Payer: Self-pay | Admitting: Podiatry

## 2021-07-22 ENCOUNTER — Ambulatory Visit (INDEPENDENT_AMBULATORY_CARE_PROVIDER_SITE_OTHER): Payer: Medicare Other | Admitting: Podiatry

## 2021-07-22 DIAGNOSIS — M722 Plantar fascial fibromatosis: Secondary | ICD-10-CM

## 2021-07-22 MED ORDER — TRIAMCINOLONE ACETONIDE 10 MG/ML IJ SUSP
20.0000 mg | Freq: Once | INTRAMUSCULAR | Status: AC
  Administered 2021-07-22: 20 mg

## 2021-07-22 NOTE — Progress Notes (Signed)
Subjective:   Patient ID: Sarah Walker, female   DOB: 71 y.o.   MRN: [id number]   HPI Patient presents stating that she is here to get her heels injected today and they have started to hurt her but they are not as bad as they have been in the past   ROS      Objective:  Physical Exam  Neurovascular status intact with discomfort and inflammation around the medial band of the fascia bilateral with fluid buildup     Assessment:  Acute Planter fasciitis bilateral with inflammation     Plan:  Standard prep I went ahead and injected the fascial insertion bilateral 3 mg Kenalog 5 mg Xylocaine and patient will be seen back as needed with patient complaining somewhat of mid foot pain but I did not note any significant arthritic issues slight on the right side  X-rays do indicate there may be some mild arthritis in the midtarsal joint right not severe and spur formation plantar heel bilateral no indication of stress fracture

## 2021-10-21 DIAGNOSIS — D485 Neoplasm of uncertain behavior of skin: Secondary | ICD-10-CM | POA: Diagnosis not present

## 2021-10-21 DIAGNOSIS — L309 Dermatitis, unspecified: Secondary | ICD-10-CM | POA: Diagnosis not present

## 2021-10-28 DIAGNOSIS — E782 Mixed hyperlipidemia: Secondary | ICD-10-CM | POA: Diagnosis not present

## 2021-10-28 DIAGNOSIS — E7849 Other hyperlipidemia: Secondary | ICD-10-CM | POA: Diagnosis not present

## 2021-10-28 DIAGNOSIS — I10 Essential (primary) hypertension: Secondary | ICD-10-CM | POA: Diagnosis not present

## 2021-10-28 DIAGNOSIS — R739 Hyperglycemia, unspecified: Secondary | ICD-10-CM | POA: Diagnosis not present

## 2021-10-28 DIAGNOSIS — L509 Urticaria, unspecified: Secondary | ICD-10-CM | POA: Diagnosis not present

## 2021-10-28 DIAGNOSIS — M1990 Unspecified osteoarthritis, unspecified site: Secondary | ICD-10-CM | POA: Diagnosis not present

## 2021-10-28 DIAGNOSIS — Z0001 Encounter for general adult medical examination with abnormal findings: Secondary | ICD-10-CM | POA: Diagnosis not present

## 2021-10-28 DIAGNOSIS — J329 Chronic sinusitis, unspecified: Secondary | ICD-10-CM | POA: Diagnosis not present

## 2021-11-03 ENCOUNTER — Inpatient Hospital Stay: Payer: Medicare Other

## 2021-11-03 DIAGNOSIS — L239 Allergic contact dermatitis, unspecified cause: Secondary | ICD-10-CM | POA: Diagnosis not present

## 2021-11-03 DIAGNOSIS — L2089 Other atopic dermatitis: Secondary | ICD-10-CM | POA: Diagnosis not present

## 2021-11-10 ENCOUNTER — Ambulatory Visit: Payer: Medicare Other | Admitting: Physician Assistant

## 2021-11-17 DIAGNOSIS — L2089 Other atopic dermatitis: Secondary | ICD-10-CM | POA: Diagnosis not present

## 2021-11-17 DIAGNOSIS — Z79899 Other long term (current) drug therapy: Secondary | ICD-10-CM | POA: Diagnosis not present

## 2021-11-17 DIAGNOSIS — L239 Allergic contact dermatitis, unspecified cause: Secondary | ICD-10-CM | POA: Diagnosis not present

## 2021-11-18 DIAGNOSIS — L309 Dermatitis, unspecified: Secondary | ICD-10-CM | POA: Diagnosis not present

## 2021-11-18 DIAGNOSIS — Z79899 Other long term (current) drug therapy: Secondary | ICD-10-CM | POA: Diagnosis not present

## 2021-11-25 DIAGNOSIS — M170 Bilateral primary osteoarthritis of knee: Secondary | ICD-10-CM | POA: Diagnosis not present

## 2021-12-01 DIAGNOSIS — L2089 Other atopic dermatitis: Secondary | ICD-10-CM | POA: Diagnosis not present

## 2021-12-02 DIAGNOSIS — J069 Acute upper respiratory infection, unspecified: Secondary | ICD-10-CM | POA: Diagnosis not present

## 2021-12-07 ENCOUNTER — Ambulatory Visit (HOSPITAL_COMMUNITY)
Admission: RE | Admit: 2021-12-07 | Discharge: 2021-12-07 | Disposition: A | Discharge status: Home / Self Care | Payer: Medicare Other | Source: Ambulatory Visit | Attending: Family Medicine | Admitting: Family Medicine

## 2021-12-07 ENCOUNTER — Other Ambulatory Visit: Payer: Self-pay | Admitting: Family Medicine

## 2021-12-07 DIAGNOSIS — M79605 Pain in left leg: Secondary | ICD-10-CM

## 2021-12-07 DIAGNOSIS — L03116 Cellulitis of left lower limb: Secondary | ICD-10-CM | POA: Diagnosis not present

## 2021-12-10 ENCOUNTER — Ambulatory Visit
Admission: EM | Admit: 2021-12-10 | Discharge: 2021-12-10 | Disposition: A | Discharge status: Home / Self Care | Payer: Medicare Other

## 2021-12-10 DIAGNOSIS — L03116 Cellulitis of left lower limb: Secondary | ICD-10-CM

## 2021-12-10 MED ORDER — DOXYCYCLINE HYCLATE 100 MG PO CAPS: 100.0000 mg | ORAL_CAPSULE | Freq: Two times a day (BID) | ORAL | 0 refills | Status: DC

## 2021-12-10 NOTE — ED Provider Notes (Signed)
[facility]    CSN: [id number] Arrival date & time: 12/10/21  1456      History   Chief Complaint No chief complaint on file.   HPI Sarah Walker is a 71 y.o. female.   Patient presents today with concerns about a knot in her left calf that she noticed yesterday.  Reports earlier this week, she was seen by her primary care provider and diagnosed with cellulitis of the left lower extremity.  Reports she was applying the prescribed ointment to the left lower extremity yesterday when she noticed the knot.  She denies tenderness or redness in the knot.    She reports is being treated with Augmentin for the cellulitis and also a recent sinus infection.  Cellulitis is not getting much better.  Her leg hurts to bear weight when she walks.  She denies fevers, nausea/vomiting.  Reports her stools have been soft because of the antibiotic.    Past Medical History:  Diagnosis Date   Abdominal hernia    Allergic rhinitis    Arthritis    Bone spur of other site    left foot   Dry eyes    Family history of kidney cancer    Family history of prostate cancer    Hypertension    Ovarian cancer ([place])    Ovarian cancer on right ([place]) 06/17/2011   Peripheral neuropathy 08/26/2011   Chemotherapy-induced   Pulmonary embolism ([place])    Sinusitis     Patient Active Problem List   Diagnosis Date Noted   S/P total right hip arthroplasty 08/26/2020   Vaginal Pap smear following hysterectomy for malignancy 03/02/2017   Screening for colorectal cancer 03/02/2017   Vaginal atrophy 03/02/2017   History of ovarian cancer 03/02/2017   Genetic testing 05/29/2015   Family history of kidney cancer    Family history of prostate cancer    Chemotherapy-induced neuropathy ([place]) 03/31/2015   Incisional hernia, incarcerated 10/15/2014   Ventral hernia 07/15/2014   Ovarian cancer on right ([place]) 06/17/2011   Hypertension 06/17/2011   H/O hysterectomy with oophorectomy 06/17/2011    Past  Surgical History:  Procedure Laterality Date   ABDOMINAL HYSTERECTOMY  05/25/2011   [place] bs&o and cancer staging   CHOLECYSTECTOMY  1990   COLONOSCOPY N/A 09/06/2014   Procedure: COLONOSCOPY;  Surgeon: Rogene Houston, MD;  [facility];  Service: Endoscopy;  Laterality: N/A;  930 - moved to 9/2 @ 8:25   [place]  10/15/2014   INCISIONAL HERNIA REPAIR  10/15/2014   Procedure: OPEN INCISIONAL HERNIA REPAIR WITH MESH AND MYOFASCIAL FLAPS;  Surgeon: Erroll Luna, MD;  Location: [place];  Service: General;;   IVC filter  05/24/11   PORT-A-CATH REMOVAL Right 11/21/2013   Procedure: MINOR REMOVAL PORT-A-CATH;  Surgeon: Jamesetta So, MD;  Location: [facility];  Service: General;  Laterality: Right;   PORTACATH PLACEMENT  06/16/11   Done at [place]     age 51's   TOTAL HIP ARTHROPLASTY Right 08/26/2020   Procedure: TOTAL HIP ARTHROPLASTY ANTERIOR APPROACH;  Surgeon: Renette Butters, MD;  [facility];  Service: Orthopedics;  Laterality: Right;   TUBAL LIGATION  1981    OB History     Gravida  3   Para  2   Term      Preterm      AB  1   Living  2      SAB  1  IAB      Ectopic      Multiple      Live Births               Home Medications    Prior to Admission medications   Medication Sig Start Date End Date Taking? Authorizing Provider  doxycycline (VIBRAMYCIN) 100 MG capsule Take 1 capsule (100 mg total) by mouth 2 (two) times daily for 7 days. 12/10/21 12/17/21 Yes Eulogio Bear, NP  DUPIXENT 300 MG/2ML SOPN Inject into the skin. 11/25/21  Yes [provider]  meloxicam (MOBIC) 15 MG tablet PLEASE SEE ATTACHED FOR DETAILED DIRECTIONS 09/13/21  Yes [provider]  mupirocin ointment (BACTROBAN) 2 % SMARTSIG:Sparingly Topical 3 Times Daily 12/07/21  Yes [provider]  acetaminophen (TYLENOL) 500 MG tablet Take 1,000 mg by mouth daily.    [provider]  Ascorbic Acid  (VITAMIN C PO) Take 4 tablets by mouth daily. Chewable    [provider]  azelastine (OPTIVAR) 0.05 % ophthalmic solution Place 1 drop into both eyes daily as needed (rag weed). 07/02/14   [provider]  baclofen (LIORESAL) 10 MG tablet Take 10 mg by mouth 2 (two) times daily. 10/20/20   [provider]  bisoprolol-hydrochlorothiazide (ZIAC) 5-6.25 MG per tablet Take 1 tablet by mouth daily.    [provider]  Calcium-Magnesium-Vitamin D (CALCIUM 500 PO) Take 1,000 mg by mouth daily. Chewable    [provider]  celecoxib (CELEBREX) 200 MG capsule Take 200 mg by mouth 2 (two) times daily. 11/19/20   [provider]  Cholecalciferol (VITAMIN D3 PO) Take 1 capsule by mouth daily.    [provider]  Cyanocobalamin (VITAMIN B-12) 2500 MCG SUBL Place 2,500 mcg under the tongue daily.    [provider]  DHA-EPA-Vitamin E (OMEGA-3 COMPLEX PO) Take 4 capsules by mouth daily. Omega xl    [provider]  diphenhydrAMINE (BENADRYL) 25 MG tablet Take 25 mg by mouth every 6 (six) hours as needed for itching. Patient not taking: Reported on 12/04/2020    [provider]  DULoxetine (CYMBALTA) 20 MG capsule TAKE 1 CAPSULE BY MOUTH EVERY DAY 12/15/20   Leron Croak, Rebekah M, PA-C  fexofenadine (ALLEGRA) 180 MG tablet Take 180 mg by mouth daily as needed for allergies or rhinitis. Patient not taking: Reported on 12/04/2020    [provider]  fluticasone (FLONASE) 50 MCG/ACT nasal spray Place 1 spray into both nostrils daily as needed for allergies or rhinitis. Patient not taking: Reported on 12/04/2020    [provider]  guaiFENesin-codeine 100-10 MG/5ML syrup Take 10 mLs by mouth every 6 (six) hours as needed. Patient not taking: Reported on 12/04/2020 10/17/20   [provider]  KLOR-CON M20 20 MEQ tablet TAKE 1 TABLET BY MOUTH EVERY DAY Patient taking differently: Take 10 mEq by mouth daily.  11/30/16   Holley Bouche, NP  Menaquinone-7 (VITAMIN K2 PO) Take 1 capsule by mouth daily. MK7    [provider]  ondansetron (ZOFRAN-ODT) 4 MG disintegrating tablet Take 4 mg by mouth daily as needed. Patient not taking: Reported on 12/04/2020 08/25/20   [provider]  valsartan (DIOVAN) 160 MG tablet Take 160 mg by mouth daily. 10/14/17   [provider]  XARELTO 10 MG TABS tablet Take 10 mg by mouth daily. 08/25/20   [provider]  zinc gluconate 50 MG tablet Take 50 mg by mouth daily.    [provider]  Zinc Oxide 40 % PSTE Apply 1 application topically daily as needed. Patient not taking: Reported on 12/04/2020 11/23/18   Roger Shelter, FNP    Family History Family History  Problem Relation Age of Onset   Stroke Mother    Cervical cancer Mother 90   Kidney cancer Father 28   Aneurysm Paternal Grandmother        90s   Hodgkin's lymphoma Maternal Uncle 33   Prostate cancer Paternal Uncle    Cancer Cousin        2 female maternal first cousins with unknown cancer   Throat cancer Paternal Uncle     Social History Social History   Tobacco Use   Smoking status: Never   Smokeless tobacco: Never  Vaping Use   Vaping Use: Never used  Substance Use Topics   Alcohol use: No   Drug use: No     Allergies   Codeine   Review of Systems Review of Systems Per HPI  Physical Exam Triage Vital Signs ED Triage Vitals  Enc Vitals Group     BP 12/10/21 1551 138/85     Pulse Rate 12/10/21 1551 83     Resp 12/10/21 1551 20     Temp 12/10/21 1551 97.8 F (36.6 C)     Temp Source 12/10/21 1551 Oral     SpO2 12/10/21 1551 93 %     Weight --      Height --      Head Circumference --      Peak Flow --      Pain Score 12/10/21 1558 0     Pain Loc --      Pain Edu? --      Excl. in [place]? --    No data found.  Updated Vital Signs BP 138/85 (BP Location: Right Arm)   Pulse 83   Temp 97.8 F (36.6 C) (Oral)   Resp 20   SpO2  93%   Visual Acuity Right Eye Distance:   Left Eye Distance:   Bilateral Distance:    Right Eye Near:   Left Eye Near:    Bilateral Near:     Physical Exam Vitals and nursing note reviewed.  Constitutional:      General: She is not in acute distress.    Appearance: Normal appearance. She is not toxic-appearing.  HENT:     Head: Normocephalic and atraumatic.     Mouth/Throat:     Mouth: Mucous membranes are moist.     Pharynx: Oropharynx is clear.  Pulmonary:     Effort: Pulmonary effort is normal. No respiratory distress.  Musculoskeletal:       Legs:       Feet:     Comments: Firm, fixed nodule to left lower extremity in approximately area marked.  Nontender to palpation.  No erythema or bruising.  No fluctuance or drainage.  Skin:    General: Skin is warm and dry.     Capillary Refill: Capillary refill takes less than 2 seconds.     Findings: Erythema present.          Comments: Erythema to left lower extremity; there is 2+ pitting edema to left lower extremity.  Capillary refill less than 3 seconds.  Sensation intact.  Neurological:     Mental Status: She is alert and oriented to person, place, and time.  Psychiatric:        Behavior: Behavior is cooperative.      UC  Treatments / Results  Labs (all labs ordered are listed, but only abnormal results are displayed) Labs Reviewed - No data to display  EKG   Radiology No results found.  Procedures Procedures (including critical care time)  Medications Ordered in UC Medications - No data to display  Initial Impression / Assessment and Plan / UC Course  I have reviewed the triage vital signs and the nursing notes.  Pertinent labs & imaging results that were available during my care of the patient were reviewed by me and considered in my medical decision making (see chart for details).   Patient is well-appearing, normotensive, afebrile, not tachycardic, not tachypneic, oxygenating well on room air.    Cellulitis of leg, left  Concerned about worsening cellulitis despite Augmentin Recommended starting doxycycline Left calf nodule is not concerning today-no tenderness, erythema, or ecchymosis After discussion with patient and shared decision making, decided to forego I&D of left ankle abscess given poor circulation and active cellulitis Strict ER precautions discussed with patient  The patient was given the opportunity to ask questions.  All questions answered to their satisfaction.  The patient is in agreement to this plan.    Final Clinical Impressions(s) / UC Diagnoses   Final diagnoses:  Cellulitis of leg, left     Discharge Instructions      Please complete the entire course of Augmentin.  Today, also start doxycycline to treat the infection in the skin around your leg.  If the redness is not improved after 48 hours of doxycycline, go to the ER.     ED Prescriptions     Medication Sig Dispense Auth. Provider   doxycycline (VIBRAMYCIN) 100 MG capsule Take 1 capsule (100 mg total) by mouth 2 (two) times daily for 7 days. 14 capsule Eulogio Bear, NP      PDMP not reviewed this encounter.   Eulogio Bear, NP 12/10/21 1825

## 2021-12-10 NOTE — ED Triage Notes (Signed)
Pt reports she has cellulitis in her left leg. She had an ultrasound on her left leg 2 days ago but she felt a knot on the back of her leg. Its red and a little swollen. Pt wants to know if its gout. Her left ankle hurts to the touch.

## 2021-12-10 NOTE — Discharge Instructions (Signed)
Please complete the entire course of Augmentin.  Today, also start doxycycline to treat the infection in the skin around your leg.  If the redness is not improved after 48 hours of doxycycline, go to the ER.

## 2021-12-11 ENCOUNTER — Inpatient Hospital Stay (HOSPITAL_COMMUNITY): Admit: 2021-12-11 | Payer: Medicare Other | Admitting: Internal Medicine

## 2021-12-11 ENCOUNTER — Encounter (HOSPITAL_COMMUNITY): Payer: Self-pay | Admitting: Orthopedic Surgery

## 2021-12-11 ENCOUNTER — Encounter (HOSPITAL_COMMUNITY): Payer: Self-pay

## 2021-12-11 DIAGNOSIS — M71071 Abscess of bursa, right ankle and foot: Secondary | ICD-10-CM | POA: Diagnosis not present

## 2021-12-11 DIAGNOSIS — M25572 Pain in left ankle and joints of left foot: Secondary | ICD-10-CM | POA: Diagnosis not present

## 2021-12-11 NOTE — Progress Notes (Signed)
PCP - Dr [name] Cardiologist - n/a  Chest x-ray - n/a EKG - DOS Stress Test - n/a ECHO - n/a Cardiac Cath - n/a  ICD Pacemaker/Loop - n/a  Sleep Study -  n/a CPAP - n/a  ERAS: Clear liquids til 4:30 am on DOS.  STOP now taking any Aspirin (unless otherwise instructed by your surgeon), Aleve, Naproxen, Ibuprofen, Motrin, Advil, Goody's, BC's, all herbal medications, fish oil, and all vitamins.   Coronavirus Screening Do you have any of the following symptoms:  Cough yes/no: No Fever (>100.60F)  yes/no: No Runny nose yes/no: No Sore throat yes/no: No Difficulty breathing/shortness of breath  yes/no: No  Have you traveled in the last 14 days and where? yes/no: No  Patient verbalized understanding of instructions that were given via phone.

## 2021-12-11 NOTE — H&P (Signed)
PREOPERATIVE H&P  Chief Complaint: Infected left leg  HPI: Sarah Walker is a 71 y.o. female who presents with a diagnosis of Infected left leg. She says this started about 1.5 weeks ago and she has been on oral Augmentin. Says this wasn't helping so she went to UC and was given Doxycycline. Continues to have pain with weight bearing on the left leg as well as redness and swelling. Symptoms are rated as moderate to severe, and have been worsening.  This is significantly impairing activities of daily living.  She has elected for surgical management.   Past Medical History:  Diagnosis Date   Abdominal hernia    Allergic rhinitis    Arthritis    Bone spur of other site    left foot   Dry eyes    Family history of kidney cancer    Family history of prostate cancer    Hypertension    Ovarian cancer ([place])    Ovarian cancer on right ([place]) 06/17/2011   Peripheral neuropathy 08/26/2011   Chemotherapy-induced   Pulmonary embolism ([place])    Sinusitis    Past Surgical History:  Procedure Laterality Date   ABDOMINAL HYSTERECTOMY  05/25/2011   [place] bs&o and cancer staging   CHOLECYSTECTOMY  1990   COLONOSCOPY N/A 09/06/2014   Procedure: COLONOSCOPY;  Surgeon: Rogene Houston, MD;  [facility];  Service: Endoscopy;  Laterality: N/A;  930 - moved to 9/2 @ 8:25   [place]  10/15/2014   INCISIONAL HERNIA REPAIR  10/15/2014   Procedure: OPEN INCISIONAL HERNIA REPAIR WITH MESH AND MYOFASCIAL FLAPS;  Surgeon: Erroll Luna, MD;  Location: [place];  Service: General;;   IVC filter  05/24/11   PORT-A-CATH REMOVAL Right 11/21/2013   Procedure: MINOR REMOVAL PORT-A-CATH;  Surgeon: Jamesetta So, MD;  Location[facility];  Service: General;  Laterality: Right;   PORTACATH PLACEMENT  06/16/11   Done at [place]     age 33's   TOTAL HIP ARTHROPLASTY Right 08/26/2020   Procedure: TOTAL HIP ARTHROPLASTY ANTERIOR APPROACH;  Surgeon: Renette Butters, MD;   [facility];  Service: Orthopedics;  Laterality: Right;   TUBAL LIGATION  1981   Social History   Socioeconomic History   Marital status: Married    Spouse name: Not on file   Number of children: Not on file   Years of education: Not on file   Highest education level: Not on file  Occupational History   Not on file  Tobacco Use   Smoking status: Never   Smokeless tobacco: Never  Vaping Use   Vaping Use: Never used  Substance and Sexual Activity   Alcohol use: No   Drug use: No   Sexual activity: Yes    Birth control/protection: Surgical    Comment: hyst  Other Topics Concern   Not on file  Social History Narrative   Not on file   Social Determinants of Health   Financial Resource Strain: Not on file  Food Insecurity: Not on file  Transportation Needs: Not on file  Physical Activity: Not on file  Stress: Not on file  Social Connections: Not on file   Family History  Problem Relation Age of Onset   Stroke Mother    Cervical cancer Mother 65   Kidney cancer Father 77   Aneurysm Paternal Grandmother        28s   Hodgkin's lymphoma Maternal Uncle 33   Prostate cancer  Paternal Uncle    Cancer Cousin        2 female maternal first cousins with unknown cancer   Throat cancer Paternal Uncle    Allergies  Allergen Reactions   Codeine Nausea And Vomiting    Not right away after a couple days of taking   Prior to Admission medications   Medication Sig Start Date End Date Taking? Authorizing Provider  acetaminophen (TYLENOL) 500 MG tablet Take 1,000 mg by mouth daily.    [provider]  Ascorbic Acid (VITAMIN C PO) Take 4 tablets by mouth daily. Chewable    [provider]  azelastine (OPTIVAR) 0.05 % ophthalmic solution Place 1 drop into both eyes daily as needed (rag weed). 07/02/14   [provider]  baclofen (LIORESAL) 10 MG tablet Take 10 mg by mouth 2 (two) times daily. 10/20/20   [provider]   bisoprolol-hydrochlorothiazide (ZIAC) 5-6.25 MG per tablet Take 1 tablet by mouth daily.    [provider]  Calcium-Magnesium-Vitamin D (CALCIUM 500 PO) Take 1,000 mg by mouth daily. Chewable    [provider]  celecoxib (CELEBREX) 200 MG capsule Take 200 mg by mouth 2 (two) times daily. 11/19/20   [provider]  Cholecalciferol (VITAMIN D3 PO) Take 1 capsule by mouth daily.    [provider]  Cyanocobalamin (VITAMIN B-12) 2500 MCG SUBL Place 2,500 mcg under the tongue daily.    [provider]  DHA-EPA-Vitamin E (OMEGA-3 COMPLEX PO) Take 4 capsules by mouth daily. Omega xl    [provider]  diphenhydrAMINE (BENADRYL) 25 MG tablet Take 25 mg by mouth every 6 (six) hours as needed for itching. Patient not taking: Reported on 12/04/2020    [provider]  doxycycline (VIBRAMYCIN) 100 MG capsule Take 1 capsule (100 mg total) by mouth 2 (two) times daily for 7 days. 12/10/21 12/17/21  Eulogio Bear, NP  DULoxetine (CYMBALTA) 20 MG capsule TAKE 1 CAPSULE BY MOUTH EVERY DAY 12/15/20   Harriett Rush, PA-C  DUPIXENT 300 MG/2ML SOPN Inject into the skin. 11/25/21   [provider]  fexofenadine (ALLEGRA) 180 MG tablet Take 180 mg by mouth daily as needed for allergies or rhinitis. Patient not taking: Reported on 12/04/2020    [provider]  fluticasone (FLONASE) 50 MCG/ACT nasal spray Place 1 spray into both nostrils daily as needed for allergies or rhinitis. Patient not taking: Reported on 12/04/2020    [provider]  guaiFENesin-codeine 100-10 MG/5ML syrup Take 10 mLs by mouth every 6 (six) hours as needed. Patient not taking: Reported on 12/04/2020 10/17/20   [provider]  KLOR-CON M20 20 MEQ tablet TAKE 1 TABLET BY MOUTH EVERY DAY Patient taking differently: Take 10 mEq by mouth daily. 11/30/16   Holley Bouche, NP  meloxicam (MOBIC) 15 MG tablet PLEASE SEE ATTACHED FOR  DETAILED DIRECTIONS 09/13/21   [provider]  Menaquinone-7 (VITAMIN K2 PO) Take 1 capsule by mouth daily. MK7    [provider]  mupirocin ointment (BACTROBAN) 2 % SMARTSIG:Sparingly Topical 3 Times Daily 12/07/21   [provider]  ondansetron (ZOFRAN-ODT) 4 MG disintegrating tablet Take 4 mg by mouth daily as needed. Patient not taking: Reported on 12/04/2020 08/25/20   [provider]  valsartan (DIOVAN) 160 MG tablet Take 160 mg by mouth daily. 10/14/17   [provider]  XARELTO 10 MG TABS tablet Take 10 mg by mouth daily. 08/25/20   [provider]  zinc gluconate 50 MG tablet Take 50 mg by mouth daily.    [provider]  Zinc Oxide 40 % PSTE Apply 1 application topically daily as needed. Patient not taking: Reported on 12/04/2020 11/23/18   Roger Shelter, FNP     Positive ROS: All other systems have been reviewed and were otherwise negative with the exception of those mentioned in the HPI and as above.  Physical Exam: General: Alert, no acute distress Cardiovascular: No pedal edema Respiratory: No cyanosis, no use of accessory musculature GI: No organomegaly, abdomen is soft and non-tender Skin: No lesions in the area of chief complaint Neurologic: Sensation intact distally Psychiatric: Patient is competent for consent with normal mood and affect Lymphatic: No axillary or cervical lymphadenopathy  MUSCULOSKELETAL: TTP left lower leg and ankle, 2+ pitting edema to these areas, blanching erythema present, decreased ROM left ankle, NVI   Imaging: LLE doppler U/S on 12/07/21 negative for DVT. Ultrasound done in our office today of the left lower leg and ankle shows an abscess that tracks deep within the leg towards the bone.    Assessment: Infected left leg  Plan: Plan for Procedure(s): IRRIGATION AND DEBRIDEMENT LEFT ANKLE/ LEG  The risks benefits and alternatives were discussed with the patient including but not  limited to the risks of nonoperative treatment, versus surgical intervention including infection, bleeding, nerve injury,  blood clots, cardiopulmonary complications, morbidity, mortality, among others, and they were willing to proceed.    She will be direct admitted to the hospital once a bed is available. I will order a left ankle CT with contrast to be done once she is there to have a better idea of the exact depth of the abscess to help with surgical planning. Currently planning for I&D on 12/13/21 at 7:30am. Will need to be NPO at midnight 12/13/21.   Weightbearing: WBAT LLE Orthopedic devices: uses canes for mobilization Showering: ok pre-op as long as no open wounds to leg Medicines: per primary team, will need IV ABX  Ortho service to follow once admitted.   Britt Bottom, [place] Office [phone] 12/11/2021 3:56 PM

## 2021-12-11 NOTE — H&P (View-Only) (Signed)
PREOPERATIVE H&P  Chief Complaint: Infected left leg  HPI: Sarah Walker is a 71 y.o. female who presents with a diagnosis of Infected left leg. She says this started about 1.5 weeks ago and she has been on oral Augmentin. Says this wasn't helping so she went to UC and was given Doxycycline. Continues to have pain with weight bearing on the left leg as well as redness and swelling. Symptoms are rated as moderate to severe, and have been worsening.  This is significantly impairing activities of daily living.  She has elected for surgical management.   Past Medical History:  Diagnosis Date   Abdominal hernia    Allergic rhinitis    Arthritis    Bone spur of other site    left foot   Dry eyes    Family history of kidney cancer    Family history of prostate cancer    Hypertension    Ovarian cancer ([place])    Ovarian cancer on right ([place]) 06/17/2011   Peripheral neuropathy 08/26/2011   Chemotherapy-induced   Pulmonary embolism ([place])    Sinusitis    Past Surgical History:  Procedure Laterality Date   ABDOMINAL HYSTERECTOMY  05/25/2011   [place] bs&o and cancer staging   CHOLECYSTECTOMY  1990   COLONOSCOPY N/A 09/06/2014   Procedure: COLONOSCOPY;  Surgeon: Rogene Houston, MD;  [facility];  Service: Endoscopy;  Laterality: N/A;  930 - moved to 9/2 @ 8:25   [place]  10/15/2014   INCISIONAL HERNIA REPAIR  10/15/2014   Procedure: OPEN INCISIONAL HERNIA REPAIR WITH MESH AND MYOFASCIAL FLAPS;  Surgeon: Erroll Luna, MD;  Location: [place];  Service: General;;   IVC filter  05/24/11   PORT-A-CATH REMOVAL Right 11/21/2013   Procedure: MINOR REMOVAL PORT-A-CATH;  Surgeon: Jamesetta So, MD;  Location: [facility];  Service: General;  Laterality: Right;   PORTACATH PLACEMENT  06/16/11   Done at [facility]     age 39's   TOTAL HIP ARTHROPLASTY Right 08/26/2020   Procedure: TOTAL HIP ARTHROPLASTY ANTERIOR APPROACH;  Surgeon: Renette Butters, MD;   Location[facility];  Service: Orthopedics;  Laterality: Right;   TUBAL LIGATION  1981   Social History   Socioeconomic History   Marital status: Married    Spouse name: Not on file   Number of children: Not on file   Years of education: Not on file   Highest education level: Not on file  Occupational History   Not on file  Tobacco Use   Smoking status: Never   Smokeless tobacco: Never  Vaping Use   Vaping Use: Never used  Substance and Sexual Activity   Alcohol use: No   Drug use: No   Sexual activity: Yes    Birth control/protection: Surgical    Comment: hyst  Other Topics Concern   Not on file  Social History Narrative   Not on file   Social Determinants of Health   Financial Resource Strain: Not on file  Food Insecurity: Not on file  Transportation Needs: Not on file  Physical Activity: Not on file  Stress: Not on file  Social Connections: Not on file   Family History  Problem Relation Age of Onset   Stroke Mother    Cervical cancer Mother 81   Kidney cancer Father 53   Aneurysm Paternal Grandmother        37s   Hodgkin's lymphoma Maternal Uncle 33   Prostate cancer  Paternal Uncle    Cancer Cousin        2 female maternal first cousins with unknown cancer   Throat cancer Paternal Uncle    Allergies  Allergen Reactions   Codeine Nausea And Vomiting    Not right away after a couple days of taking   Prior to Admission medications   Medication Sig Start Date End Date Taking? Authorizing Provider  acetaminophen (TYLENOL) 500 MG tablet Take 1,000 mg by mouth daily.    [provider]  Ascorbic Acid (VITAMIN C PO) Take 4 tablets by mouth daily. Chewable    [provider]  azelastine (OPTIVAR) 0.05 % ophthalmic solution Place 1 drop into both eyes daily as needed (rag weed). 07/02/14   [provider]  baclofen (LIORESAL) 10 MG tablet Take 10 mg by mouth 2 (two) times daily. 10/20/20   [provider]   bisoprolol-hydrochlorothiazide (ZIAC) 5-6.25 MG per tablet Take 1 tablet by mouth daily.    [provider]  Calcium-Magnesium-Vitamin D (CALCIUM 500 PO) Take 1,000 mg by mouth daily. Chewable    [provider]  celecoxib (CELEBREX) 200 MG capsule Take 200 mg by mouth 2 (two) times daily. 11/19/20   [provider]  Cholecalciferol (VITAMIN D3 PO) Take 1 capsule by mouth daily.    [provider]  Cyanocobalamin (VITAMIN B-12) 2500 MCG SUBL Place 2,500 mcg under the tongue daily.    [provider]  DHA-EPA-Vitamin E (OMEGA-3 COMPLEX PO) Take 4 capsules by mouth daily. Omega xl    [provider]  diphenhydrAMINE (BENADRYL) 25 MG tablet Take 25 mg by mouth every 6 (six) hours as needed for itching. Patient not taking: Reported on 12/04/2020    [provider]  doxycycline (VIBRAMYCIN) 100 MG capsule Take 1 capsule (100 mg total) by mouth 2 (two) times daily for 7 days. 12/10/21 12/17/21  Eulogio Bear, NP  DULoxetine (CYMBALTA) 20 MG capsule TAKE 1 CAPSULE BY MOUTH EVERY DAY 12/15/20   Harriett Rush, PA-C  DUPIXENT 300 MG/2ML SOPN Inject into the skin. 11/25/21   [provider]  fexofenadine (ALLEGRA) 180 MG tablet Take 180 mg by mouth daily as needed for allergies or rhinitis. Patient not taking: Reported on 12/04/2020    [provider]  fluticasone (FLONASE) 50 MCG/ACT nasal spray Place 1 spray into both nostrils daily as needed for allergies or rhinitis. Patient not taking: Reported on 12/04/2020    [provider]  guaiFENesin-codeine 100-10 MG/5ML syrup Take 10 mLs by mouth every 6 (six) hours as needed. Patient not taking: Reported on 12/04/2020 10/17/20   [provider]  KLOR-CON M20 20 MEQ tablet TAKE 1 TABLET BY MOUTH EVERY DAY Patient taking differently: Take 10 mEq by mouth daily. 11/30/16   Holley Bouche, NP  meloxicam (MOBIC) 15 MG tablet PLEASE SEE ATTACHED FOR  DETAILED DIRECTIONS 09/13/21   [provider]  Menaquinone-7 (VITAMIN K2 PO) Take 1 capsule by mouth daily. MK7    [provider]  mupirocin ointment (BACTROBAN) 2 % SMARTSIG:Sparingly Topical 3 Times Daily 12/07/21   [provider]  ondansetron (ZOFRAN-ODT) 4 MG disintegrating tablet Take 4 mg by mouth daily as needed. Patient not taking: Reported on 12/04/2020 08/25/20   [provider]  valsartan (DIOVAN) 160 MG tablet Take 160 mg by mouth daily. 10/14/17   [provider]  XARELTO 10 MG TABS tablet Take 10 mg by mouth daily. 08/25/20   [provider]  zinc gluconate 50 MG tablet Take 50 mg by mouth daily.    [provider]  Zinc Oxide 40 % PSTE Apply 1 application topically daily as needed. Patient not taking: Reported on 12/04/2020 11/23/18   Roger Shelter, FNP     Positive ROS: All other systems have been reviewed and were otherwise negative with the exception of those mentioned in the HPI and as above.  Physical Exam: General: Alert, no acute distress Cardiovascular: No pedal edema Respiratory: No cyanosis, no use of accessory musculature GI: No organomegaly, abdomen is soft and non-tender Skin: No lesions in the area of chief complaint Neurologic: Sensation intact distally Psychiatric: Patient is competent for consent with normal mood and affect Lymphatic: No axillary or cervical lymphadenopathy  MUSCULOSKELETAL: TTP left lower leg and ankle, 2+ pitting edema to these areas, blanching erythema present, decreased ROM left ankle, NVI   Imaging: LLE doppler U/S on 12/07/21 negative for DVT. Ultrasound done in our office today of the left lower leg and ankle shows an abscess that tracks deep within the leg towards the bone.    Assessment: Infected left leg  Plan: Plan for Procedure(s): IRRIGATION AND DEBRIDEMENT LEFT ANKLE/ LEG  The risks benefits and alternatives were discussed with the patient including but not  limited to the risks of nonoperative treatment, versus surgical intervention including infection, bleeding, nerve injury,  blood clots, cardiopulmonary complications, morbidity, mortality, among others, and they were willing to proceed.    She will be direct admitted to the hospital once a bed is available. I will order a left ankle CT with contrast to be done once she is there to have a better idea of the exact depth of the abscess to help with surgical planning. Currently planning for I&D on 12/13/21 at 7:30am. Will need to be NPO at midnight 12/13/21.   Weightbearing: WBAT LLE Orthopedic devices: uses canes for mobilization Showering: ok pre-op as long as no open wounds to leg Medicines: per primary team, will need IV ABX  Ortho service to follow once admitted.   Britt Bottom, [place] Office [phone] 12/11/2021 3:56 PM

## 2021-12-11 NOTE — Progress Notes (Signed)
Plan of Care Note for direct admission   Patient: Sarah Walker MRN: [id number]   DOA: (Not on file)  Facility requesting transfer: [facility] Requesting Provider: Fredonia Highland, MD. Reason for admission: Cellulitis, failed outpatient antibiotics Course: Patient presenting with about 1.5 weeks of cellulitis of left lower extremity.  Has seen PCP in urgent care and has received over a week of Augmentin and a dose of Rocephin without significant improvement.  Remained stable in office and afebrile.  Orthopedic provider will aspirate and sent for culture sample from suspected abscess.  Requesting direct admission for IV antibiotic therapy for patient having failed oral antibiotics outpatient.  Plan of care: The patient is accepted for admission to Med-surg  unit, at [facility].  Dr. Percell Miller notified that it may be as late as tomorrow before patient has a bed.  She has been stable and this is acceptable at this time.  Patient will receive precautions to go to the ED if significant worsening or changes/concerns.  Author: Marcelyn Bruins, MD 12/11/2021  Check [email or web address] for on-call coverage.  Nursing staff, Please call [place] number on Amion as soon as patient's arrival, so appropriate admitting provider can evaluate the pt.

## 2021-12-12 ENCOUNTER — Observation Stay (HOSPITAL_COMMUNITY): Payer: Medicare Other

## 2021-12-12 ENCOUNTER — Inpatient Hospital Stay (HOSPITAL_COMMUNITY)
Admission: EM | Admit: 2021-12-12 | Discharge: 2021-12-14 | DRG: 581 | Disposition: A | Discharge status: Home / Self Care | Payer: Medicare Other | Attending: Family Medicine | Admitting: Family Medicine

## 2021-12-12 ENCOUNTER — Encounter (HOSPITAL_COMMUNITY): Payer: Self-pay | Admitting: Internal Medicine

## 2021-12-12 DIAGNOSIS — L089 Local infection of the skin and subcutaneous tissue, unspecified: Secondary | ICD-10-CM | POA: Diagnosis not present

## 2021-12-12 DIAGNOSIS — Z9079 Acquired absence of other genital organ(s): Secondary | ICD-10-CM

## 2021-12-12 DIAGNOSIS — Z9049 Acquired absence of other specified parts of digestive tract: Secondary | ICD-10-CM | POA: Diagnosis not present

## 2021-12-12 DIAGNOSIS — Z6839 Body mass index (BMI) 39.0-39.9, adult: Secondary | ICD-10-CM

## 2021-12-12 DIAGNOSIS — Z791 Long term (current) use of non-steroidal anti-inflammatories (NSAID): Secondary | ICD-10-CM

## 2021-12-12 DIAGNOSIS — Z8049 Family history of malignant neoplasm of other genital organs: Secondary | ICD-10-CM

## 2021-12-12 DIAGNOSIS — Z90710 Acquired absence of both cervix and uterus: Secondary | ICD-10-CM

## 2021-12-12 DIAGNOSIS — M71071 Abscess of bursa, right ankle and foot: Secondary | ICD-10-CM | POA: Diagnosis not present

## 2021-12-12 DIAGNOSIS — Z86711 Personal history of pulmonary embolism: Secondary | ICD-10-CM | POA: Diagnosis not present

## 2021-12-12 DIAGNOSIS — I10 Essential (primary) hypertension: Secondary | ICD-10-CM | POA: Diagnosis not present

## 2021-12-12 DIAGNOSIS — Z7901 Long term (current) use of anticoagulants: Secondary | ICD-10-CM

## 2021-12-12 DIAGNOSIS — Z8543 Personal history of malignant neoplasm of ovary: Secondary | ICD-10-CM | POA: Diagnosis not present

## 2021-12-12 DIAGNOSIS — Z79899 Other long term (current) drug therapy: Secondary | ICD-10-CM

## 2021-12-12 DIAGNOSIS — J309 Allergic rhinitis, unspecified: Secondary | ICD-10-CM | POA: Diagnosis not present

## 2021-12-12 DIAGNOSIS — R600 Localized edema: Secondary | ICD-10-CM | POA: Diagnosis not present

## 2021-12-12 DIAGNOSIS — E785 Hyperlipidemia, unspecified: Secondary | ICD-10-CM | POA: Diagnosis present

## 2021-12-12 DIAGNOSIS — Z96641 Presence of right artificial hip joint: Secondary | ICD-10-CM | POA: Diagnosis not present

## 2021-12-12 DIAGNOSIS — L03116 Cellulitis of left lower limb: Secondary | ICD-10-CM | POA: Diagnosis not present

## 2021-12-12 DIAGNOSIS — L02416 Cutaneous abscess of left lower limb: Secondary | ICD-10-CM | POA: Diagnosis present

## 2021-12-12 DIAGNOSIS — Z90722 Acquired absence of ovaries, bilateral: Secondary | ICD-10-CM

## 2021-12-12 DIAGNOSIS — M1990 Unspecified osteoarthritis, unspecified site: Secondary | ICD-10-CM | POA: Diagnosis not present

## 2021-12-12 DIAGNOSIS — Z9851 Tubal ligation status: Secondary | ICD-10-CM

## 2021-12-12 DIAGNOSIS — L309 Dermatitis, unspecified: Secondary | ICD-10-CM | POA: Insufficient documentation

## 2021-12-12 DIAGNOSIS — Z885 Allergy status to narcotic agent status: Secondary | ICD-10-CM | POA: Diagnosis not present

## 2021-12-12 DIAGNOSIS — E669 Obesity, unspecified: Secondary | ICD-10-CM | POA: Diagnosis present

## 2021-12-12 DIAGNOSIS — R6889 Other general symptoms and signs: Secondary | ICD-10-CM | POA: Diagnosis not present

## 2021-12-12 DIAGNOSIS — L0390 Cellulitis, unspecified: Secondary | ICD-10-CM | POA: Insufficient documentation

## 2021-12-12 DIAGNOSIS — M79605 Pain in left leg: Secondary | ICD-10-CM | POA: Diagnosis present

## 2021-12-12 DIAGNOSIS — L02419 Cutaneous abscess of limb, unspecified: Secondary | ICD-10-CM | POA: Diagnosis present

## 2021-12-12 DIAGNOSIS — R7989 Other specified abnormal findings of blood chemistry: Secondary | ICD-10-CM | POA: Diagnosis not present

## 2021-12-12 LAB — CBC WITH DIFFERENTIAL/PLATELET
Abs Immature Granulocytes: 0.03 10*3/uL (ref 0.00–0.07)
Basophils Absolute: 0 10*3/uL (ref 0.0–0.1)
Basophils Relative: 1 %
Eosinophils Absolute: 0.3 10*3/uL (ref 0.0–0.5)
Eosinophils Relative: 4 %
HCT: 37.2 % (ref 36.0–46.0)
Hemoglobin: 11.9 g/dL — ABNORMAL LOW (ref 12.0–15.0)
Immature Granulocytes: 0 %
Lymphocytes Relative: 17 %
Lymphs Abs: 1.3 10*3/uL (ref 0.7–4.0)
MCH: 32 pg (ref 26.0–34.0)
MCHC: 32 g/dL (ref 30.0–36.0)
MCV: 100 fL (ref 80.0–100.0)
Monocytes Absolute: 0.6 10*3/uL (ref 0.1–1.0)
Monocytes Relative: 7 %
Neutro Abs: 5.7 10*3/uL (ref 1.7–7.7)
Neutrophils Relative %: 71 %
Platelets: 251 10*3/uL (ref 150–400)
RBC: 3.72 MIL/uL — ABNORMAL LOW (ref 3.87–5.11)
RDW: 13.8 % (ref 11.5–15.5)
WBC: 8 10*3/uL (ref 4.0–10.5)
nRBC: 0 % (ref 0.0–0.2)

## 2021-12-12 LAB — BASIC METABOLIC PANEL
Anion gap: 8 (ref 5–15)
BUN: 21 mg/dL (ref 8–23)
CO2: 23 mmol/L (ref 22–32)
Calcium: 8.9 mg/dL (ref 8.9–10.3)
Chloride: 109 mmol/L (ref 98–111)
Creatinine, Ser: 1.29 mg/dL — ABNORMAL HIGH (ref 0.44–1.00)
GFR, Estimated: 44 mL/min — ABNORMAL LOW (ref >60)
Glucose, Bld: 98 mg/dL (ref 70–99)
Potassium: 3.8 mmol/L (ref 3.5–5.1)
Sodium: 140 mmol/L (ref 135–145)

## 2021-12-12 LAB — LACTIC ACID, PLASMA
Lactic Acid, Venous: 1.5 mmol/L (ref 0.5–1.9)
Lactic Acid, Venous: 2.3 mmol/L (ref 0.5–1.9)

## 2021-12-12 MED ORDER — CEFAZOLIN SODIUM-DEXTROSE 2-4 GM/100ML-% IV SOLN
2.0000 g | INTRAVENOUS | Status: AC
  Administered 2021-12-13: 2 g via INTRAVENOUS
  Filled 2021-12-12: qty 100

## 2021-12-12 MED ORDER — LACTATED RINGERS IV BOLUS
250.0000 mL | Freq: Once | INTRAVENOUS | Status: AC
  Administered 2021-12-12: 250 mL via INTRAVENOUS

## 2021-12-12 MED ORDER — MUPIROCIN 2 % EX OINT
1.0000 | TOPICAL_OINTMENT | Freq: Two times a day (BID) | CUTANEOUS | Status: DC
  Administered 2021-12-12 – 2021-12-14 (×4): 1 via NASAL
  Filled 2021-12-12: qty 22

## 2021-12-12 MED ORDER — SODIUM CHLORIDE 0.9% FLUSH
3.0000 mL | Freq: Two times a day (BID) | INTRAVENOUS | Status: DC
  Administered 2021-12-12 – 2021-12-14 (×4): 3 mL via INTRAVENOUS

## 2021-12-12 MED ORDER — DEXAMETHASONE SODIUM PHOSPHATE 10 MG/ML IJ SOLN
8.0000 mg | Freq: Once | INTRAMUSCULAR | Status: AC
  Administered 2021-12-13: 8 mg via INTRAVENOUS
  Filled 2021-12-12: qty 1

## 2021-12-12 MED ORDER — HYDROCHLOROTHIAZIDE 12.5 MG PO TABS
6.2500 mg | ORAL_TABLET | Freq: Every day | ORAL | Status: DC
  Administered 2021-12-13 – 2021-12-14 (×2): 6.25 mg via ORAL
  Filled 2021-12-12 (×2): qty 1

## 2021-12-12 MED ORDER — SODIUM CHLORIDE 0.9 % IV SOLN
2.0000 g | INTRAVENOUS | Status: DC
  Administered 2021-12-12 – 2021-12-13 (×2): 2 g via INTRAVENOUS
  Filled 2021-12-12 (×3): qty 20

## 2021-12-12 MED ORDER — ACETAMINOPHEN 650 MG RE SUPP: 650.0000 mg | Freq: Four times a day (QID) | RECTAL | Status: DC | PRN

## 2021-12-12 MED ORDER — SODIUM CHLORIDE 0.9 % IV BOLUS
250.0000 mL | Freq: Once | INTRAVENOUS | Status: AC
  Administered 2021-12-12: 250 mL via INTRAVENOUS

## 2021-12-12 MED ORDER — IRBESARTAN 150 MG PO TABS
150.0000 mg | ORAL_TABLET | Freq: Every day | ORAL | Status: DC
  Administered 2021-12-13 – 2021-12-14 (×2): 150 mg via ORAL
  Filled 2021-12-12 (×2): qty 1

## 2021-12-12 MED ORDER — BISMUTH SUBSALICYLATE 262 MG/15ML PO SUSP
30.0000 mL | Freq: Four times a day (QID) | ORAL | Status: DC | PRN
  Filled 2021-12-12 (×2): qty 236

## 2021-12-12 MED ORDER — BISOPROLOL FUMARATE 5 MG PO TABS
5.0000 mg | ORAL_TABLET | Freq: Every day | ORAL | Status: DC
  Administered 2021-12-13 – 2021-12-14 (×2): 5 mg via ORAL
  Filled 2021-12-12 (×2): qty 1

## 2021-12-12 MED ORDER — ACETAMINOPHEN 325 MG PO TABS
650.0000 mg | ORAL_TABLET | Freq: Four times a day (QID) | ORAL | Status: DC | PRN
  Administered 2021-12-12 – 2021-12-13 (×2): 650 mg via ORAL
  Filled 2021-12-12 (×2): qty 2

## 2021-12-12 MED ORDER — PIPERACILLIN-TAZOBACTAM 3.375 G IVPB 30 MIN: 3.3750 g | Freq: Once | INTRAVENOUS | Status: DC

## 2021-12-12 MED ORDER — IOHEXOL 350 MG/ML SOLN
75.0000 mL | Freq: Once | INTRAVENOUS | Status: AC | PRN
  Administered 2021-12-12: 75 mL via INTRAVENOUS

## 2021-12-12 MED ORDER — ACETAMINOPHEN 500 MG PO TABS
1000.0000 mg | ORAL_TABLET | Freq: Once | ORAL | Status: AC
  Administered 2021-12-13: 1000 mg via ORAL
  Filled 2021-12-12: qty 2

## 2021-12-12 MED ORDER — BISOPROLOL-HYDROCHLOROTHIAZIDE 5-6.25 MG PO TABS: 1.0000 | ORAL_TABLET | Freq: Every day | ORAL | Status: DC

## 2021-12-12 MED ORDER — POVIDONE-IODINE 10 % EX SWAB
2.0000 | Freq: Once | CUTANEOUS | Status: AC
  Administered 2021-12-13: 2 via TOPICAL

## 2021-12-12 MED ORDER — VANCOMYCIN HCL IN DEXTROSE 1-5 GM/200ML-% IV SOLN: 1000.0000 mg | Freq: Once | INTRAVENOUS | Status: DC

## 2021-12-12 MED ORDER — DICYCLOMINE HCL 10 MG PO CAPS: 10.0000 mg | ORAL_CAPSULE | Freq: Four times a day (QID) | ORAL | Status: DC | PRN

## 2021-12-12 MED ORDER — POLYETHYLENE GLYCOL 3350 17 G PO PACK: 17.0000 g | PACK | Freq: Every day | ORAL | Status: DC | PRN

## 2021-12-12 NOTE — ED Provider Triage Note (Signed)
Emergency Medicine Provider Triage Evaluation Note  Sarah Walker , a 71 y.o. female  was evaluated in triage.  Pt complains of left ankle infection.  Patient sent by orthopedics for admission for IV antibiotics and I&D tomorrow morning.  Review of Systems  Positive: Left ankle pain Negative: CP  Physical Exam  BP (!) 140/97   Pulse 70   Temp 98.1 F (36.7 C)   Resp 16   SpO2 98%  Gen:   Awake, no distress   Resp:  Normal effort  MSK:   Moves extremities without difficulty  Other:    Medical Decision Making  Medically screening exam initiated at 2:27 PM.  Appropriate orders placed.  Sarah Walker was informed that the remainder of the evaluation will be completed by another provider, this initial triage assessment does not replace that evaluation, and the importance of remaining in the ED until their evaluation is complete.  Labs Blood cultures Lactic acid   Suzy Bouchard, PA-C 12/12/21 1428

## 2021-12-12 NOTE — ED Notes (Signed)
ED TO INPATIENT HANDOFF REPORT  ED Nurse Name and Phone #: Luetta Nutting [phone]  S Name/Age/Gender Sarah Walker 71 y.o. female Room/Bed: 003C/003C  Code Status   Code Status: Full Code  Home/SNF/Other Home Patient oriented to: self, place, time, and situation Is this baseline? Yes   Triage Complete: Triage complete  Chief Complaint Leg abscess [L02.419]  Triage Note Patient told to go to ED for IV antibiotics with plan for surgical I&D to on left leg tomorrow morning at 0730.   Allergies Allergies  Allergen Reactions   Codeine Nausea And Vomiting    Not right away after a couple days of taking   Erythromycin Itching and Other (See Comments)    Level of Care/Admitting Diagnosis ED Disposition     ED Disposition  Admit   Condition  --   [place]: [place] [place] [[id number]]  Level of Care: Med-Surg [16]  May place patient in observation at [facility] or [place] [facility] if equivalent level of care is available:: No  Covid Evaluation: Asymptomatic - no recent exposure (last 10 days) testing not required  Diagnosis: Leg abscess [[id number]]  Admitting Physician: Marcelyn Bruins [[id number]]  Attending Physician: Marcelyn Bruins [[id number]]          B Medical/Surgery History Past Medical History:  Diagnosis Date   Abdominal hernia    Allergic rhinitis    Arthritis    Bone spur of other site    left foot   Dry eyes    Family history of kidney cancer    Family history of prostate cancer    Hypertension    Ovarian cancer ([place])    Ovarian cancer on right ([place]) 06/17/2011   Peripheral neuropathy 08/26/2011   Chemotherapy-induced   Pulmonary embolism ([place])    Sinusitis    Past Surgical History:  Procedure Laterality Date   ABDOMINAL HYSTERECTOMY  05/25/2011   [place] bs&o and cancer staging   CHOLECYSTECTOMY  1990   COLONOSCOPY N/A 09/06/2014   Procedure: COLONOSCOPY;  Surgeon: Rogene Houston, MD;  [facility];  Service:  Endoscopy;  Laterality: N/A;  930 - moved to 9/2 @ 8:25   [place]  10/15/2014   INCISIONAL HERNIA REPAIR  10/15/2014   Procedure: OPEN INCISIONAL HERNIA REPAIR WITH MESH AND MYOFASCIAL FLAPS;  Surgeon: Erroll Luna, MD;  Location: [place];  Service: General;;   IVC filter  05/24/11   PORT-A-CATH REMOVAL Right 11/21/2013   Procedure: MINOR REMOVAL PORT-A-CATH;  Surgeon: Jamesetta So, MD;  Location[facility];  Service: General;  Laterality: Right;   PORTACATH PLACEMENT  06/16/11   Done at [place]     age 64's   TOTAL HIP ARTHROPLASTY Right 08/26/2020   Procedure: TOTAL HIP ARTHROPLASTY ANTERIOR APPROACH;  Surgeon: Renette Butters, MD;  [facility];  Service: Orthopedics;  Laterality: Right;   TUBAL LIGATION  1981     A IV Location/Drains/Wounds Patient Lines/Drains/Airways Status     Active Line/Drains/Airways     Name Placement date Placement time Site Days   Implanted Port Right Chest --  --  Chest  --   Closed System Drain 1 Right RLQ Bulb (JP) 19 Fr. 10/15/14  1007  RLQ  2615   Incision (Closed) 10/15/14 Abdomen 10/15/14  1024  -- 2615   Incision (Closed) 08/26/20 Hip Right 08/26/20  0912  -- 473            Intake/Output  Last 24 hours No intake or output data in the 24 hours ending 12/12/21 1610  Labs/Imaging Results for orders placed or performed during the hospital encounter of 12/12/21 (from the past 48 hour(s))  CBC with Differential     Status: Abnormal   Collection Time: 12/12/21  2:38 PM  Result Value Ref Range   WBC 8.0 4.0 - 10.5 K/uL   RBC 3.72 (L) 3.87 - 5.11 MIL/uL   Hemoglobin 11.9 (L) 12.0 - 15.0 g/dL   HCT 37.2 36.0 - 46.0 %   MCV 100.0 80.0 - 100.0 fL   MCH 32.0 26.0 - 34.0 pg   MCHC 32.0 30.0 - 36.0 g/dL   RDW 13.8 11.5 - 15.5 %   Platelets 251 150 - 400 K/uL   nRBC 0.0 0.0 - 0.2 %   Neutrophils Relative % 71 %   Neutro Abs 5.7 1.7 - 7.7 K/uL   Lymphocytes Relative 17 %   Lymphs Abs 1.3 0.7 - 4.0  K/uL   Monocytes Relative 7 %   Monocytes Absolute 0.6 0.1 - 1.0 K/uL   Eosinophils Relative 4 %   Eosinophils Absolute 0.3 0.0 - 0.5 K/uL   Basophils Relative 1 %   Basophils Absolute 0.0 0.0 - 0.1 K/uL   Immature Granulocytes 0 %   Abs Immature Granulocytes 0.03 0.00 - 0.07 K/uL    Comment: Performed at [facility], [address], [place], [place] [address]  Basic metabolic panel     Status: Abnormal   Collection Time: 12/12/21  2:38 PM  Result Value Ref Range   Sodium 140 135 - 145 mmol/L   Potassium 3.8 3.5 - 5.1 mmol/L   Chloride 109 98 - 111 mmol/L   CO2 23 22 - 32 mmol/L   Glucose, Bld 98 70 - 99 mg/dL    Comment: Glucose reference range applies only to samples taken after fasting for at least 8 hours.   BUN 21 8 - 23 mg/dL   Creatinine, Ser 1.29 (H) 0.44 - 1.00 mg/dL   Calcium 8.9 8.9 - 10.3 mg/dL   GFR, Estimated 44 (L) >60 mL/min    Comment: (NOTE) Calculated using the CKD-EPI Creatinine Equation (2021)    Anion gap 8 5 - 15    Comment: Performed at [facility] [address] [place], [place] [address]   No results found.  Pending Labs Unresulted Labs (From admission, onward)     Start     Ordered   12/13/21 0500  Comprehensive metabolic panel  Tomorrow morning,   R        12/12/21 1538   12/13/21 0500  CBC  Tomorrow morning,   R        12/12/21 1538   12/13/21 0500  Protime-INR  Tomorrow morning,   R        12/12/21 1538   12/12/21 1424  Lactic acid, plasma  Now then every 2 hours,   R      12/12/21 1423   12/12/21 1424  Blood culture (routine x 2)  BLOOD CULTURE X 2,   R      12/12/21 1423            Vitals/Pain Today's Vitals   12/12/21 1409 12/12/21 1413  BP: (!) 140/97   Pulse: 70   Resp: 16   Temp: 98.1 F (36.7 C)   SpO2: 98%   PainSc:  0-No pain    Isolation Precautions No active isolations  Medications  Medications  sodium chloride flush (NS) 0.9 % injection 3 mL (has no administration in time range)   acetaminophen (TYLENOL) tablet 650 mg (has no administration in time range)    Or  acetaminophen (TYLENOL) suppository 650 mg (has no administration in time range)  polyethylene glycol (MIRALAX / GLYCOLAX) packet 17 g (has no administration in time range)  cefTRIAXone (ROCEPHIN) 2 g in sodium chloride 0.9 % 100 mL IVPB (has no administration in time range)  sodium chloride 0.9 % bolus 250 mL (has no administration in time range)  bismuth subsalicylate (PEPTO BISMOL) 262 MG/15ML suspension 30 mL (has no administration in time range)  bisoprolol-hydrochlorothiazide (ZIAC) 5-6.25 MG per tablet 1 tablet (has no administration in time range)  dicyclomine (BENTYL) capsule 10 mg (has no administration in time range)  irbesartan (AVAPRO) tablet 150 mg (has no administration in time range)    Mobility walks Low fall risk   Focused Assessments    R Recommendations: See Admitting Provider Note  Report given to:   Additional Notes:

## 2021-12-12 NOTE — ED Provider Notes (Signed)
[facility] EMERGENCY DEPARTMENT Provider Note   CSN: [id number] Arrival date & time: 12/12/21  1403     History  No chief complaint on file.   Sarah Walker is a 71 y.o. female with a past medical history significant for ovarian cancer, hypertension, history of PE who presents to the ED for admission for IV antibiotics with plans for I&D of left ankle tomorrow morning at 7:30 AM.  She has been having a left lower extremity infection for the past 1.5 weeks.  She has been taking oral Augmentin and doxycycline with no relief in symptoms.  Patient was evaluated by orthopedics yesterday where I&D was scheduled on 12/10 with Dr. Percell Miller. Patient denies fever and chills.   History obtained from patient and past medical records. No interpreter used during encounter.       Home Medications Prior to Admission medications   Medication Sig Start Date End Date Taking? Authorizing Provider  acetaminophen (TYLENOL) 500 MG tablet Take 1,000 mg by mouth daily.    [provider]  Ascorbic Acid (VITAMIN C PO) Take 4 tablets by mouth daily. Chewable    [provider]  aspirin EC 81 MG tablet Take 81 mg by mouth daily. Swallow whole.    [provider]  azelastine (OPTIVAR) 0.05 % ophthalmic solution Place 1 drop into both eyes daily as needed (rag weed). 07/02/14   [provider]  baclofen (LIORESAL) 10 MG tablet Take 10 mg by mouth 2 (two) times daily. 10/20/20   [provider]  bisoprolol-hydrochlorothiazide (ZIAC) 5-6.25 MG per tablet Take 1 tablet by mouth daily.    [provider]  Calcium-Magnesium-Vitamin D (CALCIUM 500 PO) Take 1,000 mg by mouth daily. Chewable    [provider]  celecoxib (CELEBREX) 200 MG capsule Take 200 mg by mouth 2 (two) times daily. 11/19/20   [provider]  Cholecalciferol (VITAMIN D3 PO) Take 1 capsule by mouth daily.    [provider]  Cyanocobalamin (VITAMIN B-12)  2500 MCG SUBL Place 2,500 mcg under the tongue daily.    [provider]  DHA-EPA-Vitamin E (OMEGA-3 COMPLEX PO) Take 4 capsules by mouth daily. Omega xl    [provider]  diphenhydrAMINE (BENADRYL) 25 MG tablet Take 25 mg by mouth every 6 (six) hours as needed for itching. Patient not taking: Reported on 12/04/2020    [provider]  DULoxetine (CYMBALTA) 20 MG capsule TAKE 1 CAPSULE BY MOUTH EVERY DAY 12/15/20   Harriett Rush, PA-C  DUPIXENT 300 MG/2ML SOPN Inject into the skin. 11/25/21   [provider]  fexofenadine (ALLEGRA) 180 MG tablet Take 180 mg by mouth daily as needed for allergies or rhinitis.    [provider]  fluticasone (FLONASE) 50 MCG/ACT nasal spray Place 1 spray into both nostrils daily as needed for allergies or rhinitis.    [provider]  guaiFENesin-codeine 100-10 MG/5ML syrup Take 10 mLs by mouth every 6 (six) hours as needed. Patient not taking: Reported on 12/04/2020 10/17/20   [provider]  KLOR-CON M20 20 MEQ tablet TAKE 1 TABLET BY MOUTH EVERY DAY Patient taking differently: Take 10 mEq by mouth daily. 11/30/16   Holley Bouche, NP  meloxicam (MOBIC) 15 MG tablet PLEASE SEE ATTACHED FOR DETAILED DIRECTIONS 09/13/21   [provider]  Menaquinone-7 (VITAMIN K2 PO) Take 1 capsule by mouth daily. MK7    [provider]  mupirocin ointment (BACTROBAN) 2 % SMARTSIG:Sparingly Topical 3  Times Daily 12/07/21   [provider]  ondansetron (ZOFRAN-ODT) 4 MG disintegrating tablet Take 4 mg by mouth daily as needed. Patient not taking: Reported on 12/04/2020 08/25/20   [provider]  valsartan (DIOVAN) 160 MG tablet Take 160 mg by mouth daily. 10/14/17   [provider]  XARELTO 10 MG TABS tablet Take 10 mg by mouth daily. 08/25/20   [provider]  zinc gluconate 50 MG tablet Take 50 mg by mouth daily.    [provider]  Zinc Oxide 40  % PSTE Apply 1 application topically daily as needed. Patient not taking: Reported on 12/04/2020 11/23/18   Roger Shelter, FNP      Allergies    Codeine and Erythromycin    Review of Systems   Review of Systems  Constitutional:  Negative for chills and fever.  Musculoskeletal:  Positive for arthralgias, gait problem and joint swelling.  All other systems reviewed and are negative.   Physical Exam Updated Vital Signs BP (!) 140/97   Pulse 70   Temp 98.1 F (36.7 C)   Resp 16   SpO2 98%  Physical Exam Vitals and nursing note reviewed.  Constitutional:      General: She is not in acute distress.    Appearance: She is not ill-appearing.  HENT:     Head: Normocephalic.  Eyes:     Pupils: Pupils are equal, round, and reactive to light.  Cardiovascular:     Rate and Rhythm: Normal rate and regular rhythm.     Pulses: Normal pulses.     Heart sounds: Normal heart sounds. No murmur heard.    No friction rub. No gallop.  Pulmonary:     Effort: Pulmonary effort is normal.     Breath sounds: Normal breath sounds.  Abdominal:     General: Abdomen is flat. There is no distension.     Palpations: Abdomen is soft.     Tenderness: There is no abdominal tenderness. There is no guarding or rebound.  Musculoskeletal:        General: Normal range of motion.     Cervical back: Neck supple.     Comments: TTP to left ankle with edema. Pedal pulses palpable. Decreased ROM of left ankle.   Skin:    General: Skin is warm and dry.     Comments: Erythema to LLE.  Neurological:     General: No focal deficit present.     Mental Status: She is alert.  Psychiatric:        Mood and Affect: Mood normal.        Behavior: Behavior normal.     ED Results / Procedures / Treatments   Labs (all labs ordered are listed, but only abnormal results are displayed) Labs Reviewed  CBC WITH DIFFERENTIAL/PLATELET - Abnormal; Notable for the following components:      Result Value   RBC 3.72 (*)     Hemoglobin 11.9 (*)    All other components within normal limits  CULTURE, BLOOD (ROUTINE X 2)  CULTURE, BLOOD (ROUTINE X 2)  BASIC METABOLIC PANEL  LACTIC ACID, PLASMA  LACTIC ACID, PLASMA    EKG None  Radiology No results found.  Procedures Procedures    Medications Ordered in ED Medications  sodium chloride flush (NS) 0.9 % injection 3 mL (has no administration in time range)  acetaminophen (TYLENOL) tablet 650 mg (has no administration in time range)    Or  acetaminophen (TYLENOL) suppository 650 mg (  has no administration in time range)  polyethylene glycol (MIRALAX / GLYCOLAX) packet 17 g (has no administration in time range)  cefTRIAXone (ROCEPHIN) 2 g in sodium chloride 0.9 % 100 mL IVPB (has no administration in time range)    ED Course/ Medical Decision Making/ A&P                           Medical Decision Making Amount and/or Complexity of Data Reviewed External Data Reviewed: notes.    Details: Orthopedics note Labs: ordered. Decision-making details documented in ED Course. Radiology: ordered and independent interpretation performed. Decision-making details documented in ED Course.  Risk Prescription drug management. Decision regarding hospitalization.   71 year old female presents to the ED for admission for IV antibiotics and I&D of left ankle abscess. Patient failed po antibiotics. History of HTN. Patient is scheduled for I&D tomorrow, 12/10 at 7:30 AM with Dr. Percell Miller. Patient had [place] on 12/4 which was negative for DVT. Upon arrival, patient afebrile, not tachycardic or hypoxic.  Patient in no acute distress.  Edema to left ankle with surrounding erythema.  Left lower extremity neurovascularly intact. Soft compartments. Low suspicion for compartment syndrome. Ordered CT left ankle and started IV antibiotics after discussion with Noemi Chapel with orthopedics. Will discuss with hospitalist for admission.   CBC reassuring.  No leukocytosis.  Mild anemia  with a little 11.9.  3:36 PM Spoke to Dr. Trilby Drummer with [facility] who agrees to admit patient.         Final Clinical Impression(s) / ED Diagnoses Final diagnoses:  Abscess of skin of left ankle  Cellulitis of left lower extremity    Rx / DC Orders ED Discharge Orders     None         Karie Kirks 12/12/21 1542    Godfrey Pick, MD 12/12/21 2142

## 2021-12-12 NOTE — ED Triage Notes (Signed)
Patient told to go to ED for IV antibiotics with plan for surgical I&D to on left leg tomorrow morning at 0730.

## 2021-12-12 NOTE — H&P (Addendum)
History and Physical   [name] NOM:[id number] DOB: 09-25-1950 DOA: 12/12/2021  PCP: [name], MD   Patient coming from: Home  Chief Complaint: Cellulitis/abscess  HPI: Sarah Walker is a 71 y.o. female with medical history significant of hypertension, hyperlipidemia, ovarian cancer in 2013 status post hysterectomy and oophorectomy in remission, chemotherapy neuropathy, provoked PE in 2013 who presents with cellulitis and abscess of left lower extremity.  Patient has been experiencing symptoms for about the last week and a half.  She saw her PCP initially and was diagnosed with cellulitis and started on Augmentin.  She did not have much improvement with this and had some worsening pain with weightbearing.  Due to this she presented to regional urgent care couple of days ago based on their initial evaluation she was continued on on Augmentin and based on their exam no I&D was pursued.  She followed up with her orthopedic office yesterday and with continued/worsening symptoms not responding to p.o. antibiotics they requested direct admission which was attempted but has taken too long to obtain a bed so patient presented to the ED for further evaluation.  Chart review shows that after speaking with orthopedics yesterday they did a ultrasound which showed no evidence of DVT but did show a deep abscess and this abscess traced towards the bone.    She denies fevers, chills, chest pain, shortness breath, abdominal pain, constipation, diarrhea, nausea, vomiting.   ED Course: Signs in the ED stable, blood pressure in the 962 systolic.  Lab workup including BMP and CBC and lactic acid are pending.  Blood cultures pending.  CT of the ankle is pending.  Ultrasound results from yesterday as above.  Orthopedics notified of patient arrival by the ED.  Initially Vanco and Zosyn were ordered in the ED but this will be narrowed.  Review of Systems: As per HPI otherwise all other systems reviewed and are  negative.  Past Medical History:  Diagnosis Date   Abdominal hernia    Allergic rhinitis    Arthritis    Bone spur of other site    left foot   Dry eyes    Family history of kidney cancer    Family history of prostate cancer    Hypertension    Ovarian cancer ([place])    Ovarian cancer on right ([place]) 06/17/2011   Peripheral neuropathy 08/26/2011   Chemotherapy-induced   Pulmonary embolism ([place])    Sinusitis     Past Surgical History:  Procedure Laterality Date   ABDOMINAL HYSTERECTOMY  05/25/2011   [place] bs&o and cancer staging   CHOLECYSTECTOMY  1990   COLONOSCOPY N/A 09/06/2014   Procedure: COLONOSCOPY;  Surgeon: Rogene Houston, MD;  [facility];  Service: Endoscopy;  Laterality: N/A;  930 - moved to 9/2 @ 8:25   [place]  10/15/2014   INCISIONAL HERNIA REPAIR  10/15/2014   Procedure: OPEN INCISIONAL HERNIA REPAIR WITH MESH AND MYOFASCIAL FLAPS;  Surgeon: Erroll Luna, MD;  Location: [facility];  Service: General;;   IVC filter  05/24/11   PORT-A-CATH REMOVAL Right 11/21/2013   Procedure: MINOR REMOVAL PORT-A-CATH;  Surgeon: Jamesetta So, MD;  Location: [facility];  Service: General;  Laterality: Right;   PORTACATH PLACEMENT  06/16/11   Done at [facility]     age 74's   TOTAL HIP ARTHROPLASTY Right 08/26/2020   Procedure: TOTAL HIP ARTHROPLASTY ANTERIOR APPROACH;  Surgeon: Renette Butters, MD;  Location: [facility];  Service: Orthopedics;  Laterality: Right;   TUBAL LIGATION  1981    Social History  reports that she has never smoked. She has never used smokeless tobacco. She reports that she does not drink alcohol and does not use drugs.  Allergies  Allergen Reactions   Codeine Nausea And Vomiting    Not right away after a couple days of taking   Erythromycin Itching and Other (See Comments)    Family History  Problem Relation Age of Onset   Stroke Mother    Cervical cancer Mother 80   Kidney cancer Father 58   Aneurysm  Paternal Grandmother        85s   Hodgkin's lymphoma Maternal Uncle 33   Prostate cancer Paternal Uncle    Cancer Cousin        2 female maternal first cousins with unknown cancer   Throat cancer Paternal Uncle   Reviewed on admission  Prior to Admission medications   Medication Sig Start Date End Date Taking? Authorizing Provider  acetaminophen (TYLENOL) 500 MG tablet Take 1,000 mg by mouth daily.    [provider]  Ascorbic Acid (VITAMIN C PO) Take 4 tablets by mouth daily. Chewable    [provider]  aspirin EC 81 MG tablet Take 81 mg by mouth daily. Swallow whole.    [provider]  azelastine (OPTIVAR) 0.05 % ophthalmic solution Place 1 drop into both eyes daily as needed (rag weed). 07/02/14   [provider]  baclofen (LIORESAL) 10 MG tablet Take 10 mg by mouth 2 (two) times daily. 10/20/20   [provider]  bisoprolol-hydrochlorothiazide (ZIAC) 5-6.25 MG per tablet Take 1 tablet by mouth daily.    [provider]  Calcium-Magnesium-Vitamin D (CALCIUM 500 PO) Take 1,000 mg by mouth daily. Chewable    [provider]  celecoxib (CELEBREX) 200 MG capsule Take 200 mg by mouth 2 (two) times daily. 11/19/20   [provider]  Cholecalciferol (VITAMIN D3 PO) Take 1 capsule by mouth daily.    [provider]  Cyanocobalamin (VITAMIN B-12) 2500 MCG SUBL Place 2,500 mcg under the tongue daily.    [provider]  DHA-EPA-Vitamin E (OMEGA-3 COMPLEX PO) Take 4 capsules by mouth daily. Omega xl    [provider]  diphenhydrAMINE (BENADRYL) 25 MG tablet Take 25 mg by mouth every 6 (six) hours as needed for itching. Patient not taking: Reported on 12/04/2020    [provider]  DULoxetine (CYMBALTA) 20 MG capsule TAKE 1 CAPSULE BY MOUTH EVERY DAY 12/15/20   Harriett Rush, PA-C  DUPIXENT 300 MG/2ML SOPN Inject into the skin. 11/25/21   [provider]  fexofenadine (ALLEGRA)  180 MG tablet Take 180 mg by mouth daily as needed for allergies or rhinitis.    [provider]  fluticasone (FLONASE) 50 MCG/ACT nasal spray Place 1 spray into both nostrils daily as needed for allergies or rhinitis.    [provider]  guaiFENesin-codeine 100-10 MG/5ML syrup Take 10 mLs by mouth every 6 (six) hours as needed. Patient not taking: Reported on 12/04/2020 10/17/20   [provider]  KLOR-CON M20 20 MEQ tablet TAKE 1 TABLET BY MOUTH EVERY DAY Patient taking differently: Take 10 mEq by mouth daily. 11/30/16   Holley Bouche, NP  meloxicam (MOBIC) 15 MG tablet PLEASE SEE ATTACHED FOR DETAILED DIRECTIONS 09/13/21   [provider]  Menaquinone-7 (VITAMIN K2 PO) Take 1 capsule by mouth daily. MK7  [provider]  mupirocin ointment (BACTROBAN) 2 % SMARTSIG:Sparingly Topical 3 Times Daily 12/07/21   [provider]  ondansetron (ZOFRAN-ODT) 4 MG disintegrating tablet Take 4 mg by mouth daily as needed. Patient not taking: Reported on 12/04/2020 08/25/20   [provider]  valsartan (DIOVAN) 160 MG tablet Take 160 mg by mouth daily. 10/14/17   [provider]  XARELTO 10 MG TABS tablet Take 10 mg by mouth daily. 08/25/20   [provider]  zinc gluconate 50 MG tablet Take 50 mg by mouth daily.    [provider]  Zinc Oxide 40 % PSTE Apply 1 application topically daily as needed. Patient not taking: Reported on 12/04/2020 11/23/18   Roger Shelter, FNP    Physical Exam: Vitals:   12/12/21 1409  BP: (!) 140/97  Pulse: 70  Resp: 16  Temp: 98.1 F (36.7 C)  SpO2: 98%    Physical Exam Constitutional:      General: She is not in acute distress.    Appearance: Normal appearance. She is obese.  HENT:     Head: Normocephalic and atraumatic.     Mouth/Throat:     Mouth: Mucous membranes are moist.     Pharynx: Oropharynx is clear.  Eyes:     Extraocular Movements: Extraocular movements  intact.     Pupils: Pupils are equal, round, and reactive to light.  Cardiovascular:     Rate and Rhythm: Normal rate and regular rhythm.     Pulses: Normal pulses.     Heart sounds: Normal heart sounds.  Pulmonary:     Effort: Pulmonary effort is normal. No respiratory distress.     Breath sounds: Normal breath sounds.  Abdominal:     General: Bowel sounds are normal. There is no distension.     Palpations: Abdomen is soft.     Tenderness: There is no abdominal tenderness.  Musculoskeletal:        General: Swelling and tenderness present. No deformity.     Comments: Left lower extremity erythema, edema, warmth and tenderness  Skin:    General: Skin is warm and dry.  Neurological:     General: No focal deficit present.     Mental Status: Mental status is at baseline.    Labs on Admission: I have personally reviewed following labs and imaging studies  CBC: Recent Labs  Lab 12/12/21 1438  WBC 8.0  NEUTROABS 5.7  HGB 11.9*  HCT 37.2  MCV 100.0  PLT 621    Basic Metabolic Panel: Recent Labs  Lab 12/12/21 1438  NA 140  K 3.8  CL 109  CO2 23  GLUCOSE 98  BUN 21  CREATININE 1.29*  CALCIUM 8.9    GFR: Estimated Creatinine Clearance: 39.7 mL/min (A) (by C-G formula based on SCr of 1.29 mg/dL (H)).  Liver Function Tests: No results for input(s): "AST", "ALT", "ALKPHOS", "BILITOT", "PROT", "ALBUMIN" in the last 168 hours.  Urine analysis: No results found for: "COLORURINE", "APPEARANCEUR", "LABSPEC", "PHURINE", "GLUCOSEU", "HGBUR", "BILIRUBINUR", "KETONESUR", "PROTEINUR", "UROBILINOGEN", "NITRITE", "LEUKOCYTESUR"  Radiological Exams on Admission: No results found.  EKG: Not performed in the emergency department  Assessment/Plan Principal Problem:   Leg abscess Active Problems:   Hypertension   Cellulitis   Elevated serum creatinine   Leg abscess Cellulitis > Patient presenting with a week to week and a half of left lower extremity cellulitis not  responding to outpatient p.o. antibiotics. > Has been persistent/worsening with pain on ambulation.  When seen by  orthopedics yesterday ultrasound noted abscess that was tracing deep towards the bone. > Orthopedics notified of patient arrival plan is already been in place for I&D tomorrow and CT scan prior to this to get a better idea of extent of abscess.  Patient was originally excepted as direct admission but had taken too long to get a bed so she presented to the ED. > Did recently have first 2 injections of Dupixent and had recent knee steroid injections, do not suspect are these to be significantly related to current infection. > Remains hemodynamically stable in the ED. -  Monitor on MedSurg - Appreciate orthopedics recommendations - Narrow antibiotics from vancomycin and Zosyn to ceftriaxone, appears to have been given yet - Trend fever curve and WBC - Follow-up CT left ankle  Elevated creatinine > Creatinine came back elevated 1.29 however this is similar to 1 year ago at 1.22.  Unclear if this represents a degree of CKD versus AKI.  Will give small bolus of IV fluids and recheck. - Trend renal function and electrolytes  Hypertension - Continue home bisoprolol-hydrochlorothiazide, - Replace home with valsartan with formulary irbesartan  History of ovarian cancer in 2013 History of provoked PE in 2013 - Currently on surveillance for her ovarian cancer, status post hysterectomy and due for ectomy. - No longer on anticoagulation  DVT prophylaxis: SCDs Code Status:   Full Family Communication:  Updated at bedside Disposition Plan:   Patient is from:  Home  Anticipated DC to:  Home  Anticipated DC date:  1 to 2 days  Anticipated DC barriers: None  Consults called:  Orthopedics Admission status:  Observation, MedSurg  Severity of Illness: The appropriate patient status for this patient is OBSERVATION. Observation status is judged to be reasonable and necessary in order to  provide the required intensity of service to ensure the patient's safety. The patient's presenting symptoms, physical exam findings, and initial radiographic and laboratory data in the context of their medical condition is felt to place them at decreased risk for further clinical deterioration. Furthermore, it is anticipated that the patient will be medically stable for discharge from the hospital within 2 midnights of admission.    Marcelyn Bruins MD [facility]  How to contact the [facility] Attending or Consulting provider [place] or covering provider during after hours [place], for this patient?   Check the care team in [facility] and look for a) attending/consulting [facility] provider listed and b) the [facility] team listed Log into [email or web address] and use [facility]'s universal password to access. If you do not have the password, please contact the hospital operator. Locate the [facility] provider you are looking for under [facility] and page to a number that you can be directly reached. If you still have difficulty reaching the provider, please page the [facility] (Director on Call) for the Hospitalists listed on amion for assistance.  12/12/2021, 4:08 PM

## 2021-12-13 ENCOUNTER — Observation Stay (HOSPITAL_BASED_OUTPATIENT_CLINIC_OR_DEPARTMENT_OTHER): Payer: Medicare Other | Admitting: Certified Registered Nurse Anesthetist

## 2021-12-13 ENCOUNTER — Other Ambulatory Visit: Payer: Self-pay

## 2021-12-13 ENCOUNTER — Encounter (HOSPITAL_COMMUNITY)
Admission: EM | Disposition: A | Discharge status: Home / Self Care | Payer: Self-pay | Source: Home / Self Care | Attending: Family Medicine

## 2021-12-13 ENCOUNTER — Inpatient Hospital Stay (HOSPITAL_COMMUNITY): Admission: RE | Admit: 2021-12-13 | Payer: Medicare Other | Source: Home / Self Care | Admitting: Orthopedic Surgery

## 2021-12-13 ENCOUNTER — Encounter (HOSPITAL_COMMUNITY): Payer: Self-pay | Admitting: Internal Medicine

## 2021-12-13 ENCOUNTER — Observation Stay (HOSPITAL_COMMUNITY): Payer: Medicare Other | Admitting: Certified Registered Nurse Anesthetist

## 2021-12-13 DIAGNOSIS — M1990 Unspecified osteoarthritis, unspecified site: Secondary | ICD-10-CM

## 2021-12-13 DIAGNOSIS — Z8049 Family history of malignant neoplasm of other genital organs: Secondary | ICD-10-CM | POA: Diagnosis not present

## 2021-12-13 DIAGNOSIS — L089 Local infection of the skin and subcutaneous tissue, unspecified: Secondary | ICD-10-CM

## 2021-12-13 DIAGNOSIS — L03116 Cellulitis of left lower limb: Secondary | ICD-10-CM | POA: Diagnosis present

## 2021-12-13 DIAGNOSIS — L02416 Cutaneous abscess of left lower limb: Secondary | ICD-10-CM | POA: Diagnosis present

## 2021-12-13 DIAGNOSIS — Z90710 Acquired absence of both cervix and uterus: Secondary | ICD-10-CM | POA: Diagnosis not present

## 2021-12-13 DIAGNOSIS — Z9079 Acquired absence of other genital organ(s): Secondary | ICD-10-CM | POA: Diagnosis not present

## 2021-12-13 DIAGNOSIS — Z8543 Personal history of malignant neoplasm of ovary: Secondary | ICD-10-CM | POA: Diagnosis not present

## 2021-12-13 DIAGNOSIS — I10 Essential (primary) hypertension: Secondary | ICD-10-CM | POA: Diagnosis present

## 2021-12-13 DIAGNOSIS — R6889 Other general symptoms and signs: Secondary | ICD-10-CM | POA: Diagnosis present

## 2021-12-13 DIAGNOSIS — Z7901 Long term (current) use of anticoagulants: Secondary | ICD-10-CM | POA: Diagnosis not present

## 2021-12-13 DIAGNOSIS — J309 Allergic rhinitis, unspecified: Secondary | ICD-10-CM | POA: Diagnosis present

## 2021-12-13 DIAGNOSIS — Z9851 Tubal ligation status: Secondary | ICD-10-CM | POA: Diagnosis not present

## 2021-12-13 DIAGNOSIS — Z96641 Presence of right artificial hip joint: Secondary | ICD-10-CM | POA: Diagnosis present

## 2021-12-13 DIAGNOSIS — R7989 Other specified abnormal findings of blood chemistry: Secondary | ICD-10-CM | POA: Diagnosis present

## 2021-12-13 DIAGNOSIS — Z79899 Other long term (current) drug therapy: Secondary | ICD-10-CM | POA: Diagnosis not present

## 2021-12-13 DIAGNOSIS — Z86711 Personal history of pulmonary embolism: Secondary | ICD-10-CM | POA: Diagnosis not present

## 2021-12-13 DIAGNOSIS — L02419 Cutaneous abscess of limb, unspecified: Secondary | ICD-10-CM | POA: Diagnosis present

## 2021-12-13 DIAGNOSIS — Z791 Long term (current) use of non-steroidal anti-inflammatories (NSAID): Secondary | ICD-10-CM | POA: Diagnosis not present

## 2021-12-13 DIAGNOSIS — Z885 Allergy status to narcotic agent status: Secondary | ICD-10-CM | POA: Diagnosis not present

## 2021-12-13 DIAGNOSIS — Z9049 Acquired absence of other specified parts of digestive tract: Secondary | ICD-10-CM | POA: Diagnosis not present

## 2021-12-13 DIAGNOSIS — E669 Obesity, unspecified: Secondary | ICD-10-CM | POA: Diagnosis present

## 2021-12-13 DIAGNOSIS — Z6839 Body mass index (BMI) 39.0-39.9, adult: Secondary | ICD-10-CM | POA: Diagnosis not present

## 2021-12-13 DIAGNOSIS — Z90722 Acquired absence of ovaries, bilateral: Secondary | ICD-10-CM | POA: Diagnosis not present

## 2021-12-13 DIAGNOSIS — E785 Hyperlipidemia, unspecified: Secondary | ICD-10-CM | POA: Diagnosis present

## 2021-12-13 DIAGNOSIS — M79605 Pain in left leg: Secondary | ICD-10-CM | POA: Diagnosis present

## 2021-12-13 HISTORY — PX: I & D EXTREMITY: SHX5045

## 2021-12-13 LAB — COMPREHENSIVE METABOLIC PANEL
ALT: 12 U/L (ref 0–44)
AST: 18 U/L (ref 15–41)
Albumin: 2.6 g/dL — ABNORMAL LOW (ref 3.5–5.0)
Alkaline Phosphatase: 37 U/L — ABNORMAL LOW (ref 38–126)
Anion gap: 11 (ref 5–15)
BUN: 16 mg/dL (ref 8–23)
CO2: 21 mmol/L — ABNORMAL LOW (ref 22–32)
Calcium: 8.7 mg/dL — ABNORMAL LOW (ref 8.9–10.3)
Chloride: 108 mmol/L (ref 98–111)
Creatinine, Ser: 0.96 mg/dL (ref 0.44–1.00)
GFR, Estimated: >60 mL/min (ref >60)
Glucose, Bld: 102 mg/dL — ABNORMAL HIGH (ref 70–99)
Potassium: 3.6 mmol/L (ref 3.5–5.1)
Sodium: 140 mmol/L (ref 135–145)
Total Bilirubin: 0.4 mg/dL (ref 0.3–1.2)
Total Protein: 6 g/dL — ABNORMAL LOW (ref 6.5–8.1)

## 2021-12-13 LAB — CBC
HCT: 33.3 % — ABNORMAL LOW (ref 36.0–46.0)
Hemoglobin: 10.9 g/dL — ABNORMAL LOW (ref 12.0–15.0)
MCH: 31.9 pg (ref 26.0–34.0)
MCHC: 32.7 g/dL (ref 30.0–36.0)
MCV: 97.4 fL (ref 80.0–100.0)
Platelets: 229 10*3/uL (ref 150–400)
RBC: 3.42 MIL/uL — ABNORMAL LOW (ref 3.87–5.11)
RDW: 13.9 % (ref 11.5–15.5)
WBC: 6.5 10*3/uL (ref 4.0–10.5)
nRBC: 0 % (ref 0.0–0.2)

## 2021-12-13 LAB — SURGICAL PCR SCREEN
MRSA, PCR: NEGATIVE
Staphylococcus aureus: NEGATIVE

## 2021-12-13 LAB — PROTIME-INR
INR: 1 (ref 0.8–1.2)
Prothrombin Time: 13.3 seconds (ref 11.4–15.2)

## 2021-12-13 LAB — LACTIC ACID, PLASMA: Lactic Acid, Venous: 1.9 mmol/L (ref 0.5–1.9)

## 2021-12-13 SURGERY — IRRIGATION AND DEBRIDEMENT EXTREMITY
Anesthesia: General | Laterality: Left

## 2021-12-13 MED ORDER — ONDANSETRON HCL 4 MG/2ML IJ SOLN
INTRAMUSCULAR | Status: DC | PRN
  Administered 2021-12-13: 4 mg via INTRAVENOUS

## 2021-12-13 MED ORDER — FENTANYL 2500MCG IN NS 250ML (10MCG/ML) PREMIX INFUSION
INTRAVENOUS | Status: AC
  Filled 2021-12-13: qty 250

## 2021-12-13 MED ORDER — DIPHENHYDRAMINE HCL 12.5 MG/5ML PO ELIX: 12.5000 mg | ORAL_SOLUTION | ORAL | Status: DC | PRN

## 2021-12-13 MED ORDER — PROPOFOL 10 MG/ML IV BOLUS
INTRAVENOUS | Status: DC | PRN
  Administered 2021-12-13: 20 mg via INTRAVENOUS
  Administered 2021-12-13: 140 mg via INTRAVENOUS

## 2021-12-13 MED ORDER — METHOCARBAMOL 1000 MG/10ML IJ SOLN: 500.0000 mg | Freq: Four times a day (QID) | INTRAVENOUS | Status: DC | PRN

## 2021-12-13 MED ORDER — ACETAMINOPHEN 10 MG/ML IV SOLN
INTRAVENOUS | Status: AC
  Filled 2021-12-13: qty 100

## 2021-12-13 MED ORDER — CHLORHEXIDINE GLUCONATE 0.12 % MT SOLN: 15.0000 mL | Freq: Once | OROMUCOSAL | Status: AC

## 2021-12-13 MED ORDER — LIDOCAINE 2% (20 MG/ML) 5 ML SYRINGE
INTRAMUSCULAR | Status: DC | PRN
  Administered 2021-12-13: 60 mg via INTRAVENOUS

## 2021-12-13 MED ORDER — ACETAMINOPHEN 10 MG/ML IV SOLN: 1000.0000 mg | Freq: Once | INTRAVENOUS | Status: DC | PRN

## 2021-12-13 MED ORDER — LACTATED RINGERS IV SOLN: INTRAVENOUS | Status: DC | PRN

## 2021-12-13 MED ORDER — CEFAZOLIN SODIUM-DEXTROSE 2-4 GM/100ML-% IV SOLN
INTRAVENOUS | Status: AC
  Filled 2021-12-13: qty 100

## 2021-12-13 MED ORDER — 0.9 % SODIUM CHLORIDE (POUR BTL) OPTIME
TOPICAL | Status: DC | PRN
  Administered 2021-12-13: 1000 mL

## 2021-12-13 MED ORDER — METHOCARBAMOL 500 MG PO TABS
500.0000 mg | ORAL_TABLET | Freq: Four times a day (QID) | ORAL | Status: DC | PRN
  Administered 2021-12-13 (×2): 500 mg via ORAL
  Filled 2021-12-13 (×2): qty 1

## 2021-12-13 MED ORDER — HYDROMORPHONE HCL 1 MG/ML IJ SOLN: 0.5000 mg | INTRAMUSCULAR | Status: DC | PRN

## 2021-12-13 MED ORDER — FENTANYL CITRATE (PF) 100 MCG/2ML IJ SOLN
25.0000 ug | INTRAMUSCULAR | Status: DC | PRN
  Administered 2021-12-13 (×2): 50 ug via INTRAVENOUS

## 2021-12-13 MED ORDER — CHLORHEXIDINE GLUCONATE 0.12 % MT SOLN
OROMUCOSAL | Status: AC
  Administered 2021-12-13: 15 mL via OROMUCOSAL
  Filled 2021-12-13: qty 15

## 2021-12-13 MED ORDER — OXYCODONE HCL 5 MG PO TABS
5.0000 mg | ORAL_TABLET | ORAL | Status: DC | PRN
  Administered 2021-12-14: 5 mg via ORAL
  Filled 2021-12-13: qty 1
  Filled 2021-12-13: qty 2

## 2021-12-13 MED ORDER — ORAL CARE MOUTH RINSE: 15.0000 mL | Freq: Once | OROMUCOSAL | Status: AC

## 2021-12-13 MED ORDER — OXYCODONE HCL 5 MG PO TABS
10.0000 mg | ORAL_TABLET | ORAL | Status: DC | PRN
  Administered 2021-12-13 (×2): 15 mg via ORAL
  Administered 2021-12-13: 10 mg via ORAL
  Filled 2021-12-13 (×2): qty 3

## 2021-12-13 MED ORDER — ACETAMINOPHEN 160 MG/5ML PO SOLN: 325.0000 mg | ORAL | Status: DC | PRN

## 2021-12-13 MED ORDER — LACTATED RINGERS IV SOLN: INTRAVENOUS | Status: DC

## 2021-12-13 MED ORDER — METOCLOPRAMIDE HCL 5 MG/ML IJ SOLN: 5.0000 mg | Freq: Three times a day (TID) | INTRAMUSCULAR | Status: DC | PRN

## 2021-12-13 MED ORDER — ROCURONIUM BROMIDE 10 MG/ML (PF) SYRINGE
PREFILLED_SYRINGE | INTRAVENOUS | Status: AC
  Filled 2021-12-13: qty 10

## 2021-12-13 MED ORDER — FENTANYL CITRATE (PF) 250 MCG/5ML IJ SOLN
INTRAMUSCULAR | Status: DC | PRN
  Administered 2021-12-13 (×4): 25 ug via INTRAVENOUS

## 2021-12-13 MED ORDER — VANCOMYCIN HCL 1000 MG IV SOLR
INTRAVENOUS | Status: AC
  Filled 2021-12-13: qty 20

## 2021-12-13 MED ORDER — ONDANSETRON HCL 4 MG/2ML IJ SOLN
INTRAMUSCULAR | Status: AC
  Filled 2021-12-13: qty 2

## 2021-12-13 MED ORDER — FENTANYL CITRATE (PF) 100 MCG/2ML IJ SOLN
INTRAMUSCULAR | Status: AC
  Filled 2021-12-13: qty 2

## 2021-12-13 MED ORDER — FENTANYL CITRATE (PF) 250 MCG/5ML IJ SOLN
INTRAMUSCULAR | Status: AC
  Filled 2021-12-13: qty 5

## 2021-12-13 MED ORDER — PROPOFOL 10 MG/ML IV BOLUS
INTRAVENOUS | Status: AC
  Filled 2021-12-13: qty 20

## 2021-12-13 MED ORDER — ACETAMINOPHEN 325 MG PO TABS: 325.0000 mg | ORAL_TABLET | ORAL | Status: DC | PRN

## 2021-12-13 MED ORDER — DOCUSATE SODIUM 100 MG PO CAPS
100.0000 mg | ORAL_CAPSULE | Freq: Two times a day (BID) | ORAL | Status: DC
  Administered 2021-12-14: 100 mg via ORAL
  Filled 2021-12-13 (×2): qty 1

## 2021-12-13 MED ORDER — ENOXAPARIN SODIUM 40 MG/0.4ML IJ SOSY
40.0000 mg | PREFILLED_SYRINGE | INTRAMUSCULAR | Status: DC
  Administered 2021-12-14: 40 mg via SUBCUTANEOUS
  Filled 2021-12-13: qty 0.4

## 2021-12-13 MED ORDER — SODIUM CHLORIDE 0.9 % IR SOLN
Status: DC | PRN
  Administered 2021-12-13: 3000 mL

## 2021-12-13 MED ORDER — OXYCODONE HCL 5 MG PO TABS: 5.0000 mg | ORAL_TABLET | Freq: Once | ORAL | Status: DC | PRN

## 2021-12-13 MED ORDER — PROMETHAZINE HCL 25 MG/ML IJ SOLN: 6.2500 mg | INTRAMUSCULAR | Status: DC | PRN

## 2021-12-13 MED ORDER — LIDOCAINE 2% (20 MG/ML) 5 ML SYRINGE
INTRAMUSCULAR | Status: AC
  Filled 2021-12-13: qty 5

## 2021-12-13 MED ORDER — ONDANSETRON HCL 4 MG PO TABS: 4.0000 mg | ORAL_TABLET | Freq: Four times a day (QID) | ORAL | Status: DC | PRN

## 2021-12-13 MED ORDER — VANCOMYCIN HCL 1000 MG IV SOLR
INTRAVENOUS | Status: DC | PRN
  Administered 2021-12-13: 1000 mg via TOPICAL

## 2021-12-13 MED ORDER — ONDANSETRON HCL 4 MG/2ML IJ SOLN: 4.0000 mg | Freq: Four times a day (QID) | INTRAMUSCULAR | Status: DC | PRN

## 2021-12-13 MED ORDER — AMISULPRIDE (ANTIEMETIC) 5 MG/2ML IV SOLN: 10.0000 mg | Freq: Once | INTRAVENOUS | Status: DC | PRN

## 2021-12-13 MED ORDER — ACETAMINOPHEN 325 MG PO TABS: 325.0000 mg | ORAL_TABLET | Freq: Four times a day (QID) | ORAL | Status: DC | PRN

## 2021-12-13 MED ORDER — METOCLOPRAMIDE HCL 5 MG PO TABS: 5.0000 mg | ORAL_TABLET | Freq: Three times a day (TID) | ORAL | Status: DC | PRN

## 2021-12-13 MED ORDER — ACETAMINOPHEN 500 MG PO TABS
1000.0000 mg | ORAL_TABLET | Freq: Four times a day (QID) | ORAL | Status: AC
  Administered 2021-12-13 – 2021-12-14 (×4): 1000 mg via ORAL
  Filled 2021-12-13 (×5): qty 2

## 2021-12-13 MED ORDER — OXYCODONE HCL 5 MG/5ML PO SOLN: 5.0000 mg | Freq: Once | ORAL | Status: DC | PRN

## 2021-12-13 SURGICAL SUPPLY — 63 items
BAG COUNTER SPONGE SURGICOUNT (BAG) ×1 IMPLANT
BAG SPNG CNTER NS LX DISP (BAG) ×1
BANDAGE ESMARK 6X9 LF (GAUZE/BANDAGES/DRESSINGS) IMPLANT
BLADE SURG 10 STRL SS (BLADE) ×1 IMPLANT
BNDG CMPR 9X4 STRL LF SNTH (GAUZE/BANDAGES/DRESSINGS)
BNDG CMPR 9X6 STRL LF SNTH (GAUZE/BANDAGES/DRESSINGS)
BNDG COHESIVE 4X5 TAN STRL (GAUZE/BANDAGES/DRESSINGS) ×1 IMPLANT
BNDG ELASTIC 4X5.8 VLCR STR LF (GAUZE/BANDAGES/DRESSINGS) ×1 IMPLANT
BNDG ELASTIC 6X5.8 VLCR STR LF (GAUZE/BANDAGES/DRESSINGS) ×1 IMPLANT
BNDG ESMARK 4X9 LF (GAUZE/BANDAGES/DRESSINGS) IMPLANT
BNDG ESMARK 6X9 LF (GAUZE/BANDAGES/DRESSINGS)
BNDG GAUZE DERMACEA FLUFF 4 (GAUZE/BANDAGES/DRESSINGS) ×1 IMPLANT
BNDG GZE DERMACEA 4 6PLY (GAUZE/BANDAGES/DRESSINGS)
CNTNR URN SCR LID CUP LEK RST (MISCELLANEOUS) IMPLANT
CONT SPEC 4OZ STRL OR WHT (MISCELLANEOUS)
COVER SURGICAL LIGHT HANDLE (MISCELLANEOUS) ×1 IMPLANT
CUFF TOURN SGL LL 12 NO SLV (MISCELLANEOUS) IMPLANT
CUFF TOURN SGL QUICK 34 (TOURNIQUET CUFF) ×1
CUFF TRNQT CYL 34X4.125X (TOURNIQUET CUFF) IMPLANT
DRAPE SURG 17X23 STRL (DRAPES) IMPLANT
DRAPE U-SHAPE 47X51 STRL (DRAPES) IMPLANT
DRSG AQUACEL ADVANTAGE 4X5 (GAUZE/BANDAGES/DRESSINGS) IMPLANT
DURAPREP 26ML APPLICATOR (WOUND CARE) ×1 IMPLANT
ELECT REM PT RETURN 9FT ADLT (ELECTROSURGICAL)
ELECTRODE REM PT RTRN 9FT ADLT (ELECTROSURGICAL) IMPLANT
EVACUATOR 1/8 PVC DRAIN (DRAIN) IMPLANT
FACESHIELD WRAPAROUND (MASK) IMPLANT
FACESHIELD WRAPAROUND OR TEAM (MASK) ×1 IMPLANT
GAUZE PAD ABD 8X10 STRL (GAUZE/BANDAGES/DRESSINGS) ×1 IMPLANT
GAUZE SPONGE 4X4 12PLY STRL (GAUZE/BANDAGES/DRESSINGS) ×1 IMPLANT
GAUZE XEROFORM 1X8 LF (GAUZE/BANDAGES/DRESSINGS) ×1 IMPLANT
GLOVE BIO SURGEON STRL SZ7.5 (GLOVE) ×1 IMPLANT
GLOVE BIOGEL PI IND STRL 7.5 (GLOVE) ×1 IMPLANT
GLOVE BIOGEL PI IND STRL 8 (GLOVE) ×1 IMPLANT
GLOVE SURG SYN 7.5  E (GLOVE)
GLOVE SURG SYN 7.5 E (GLOVE) IMPLANT
GLOVE SURG SYN 7.5 PF PI (GLOVE) ×1 IMPLANT
GOWN STRL REUS W/ TWL LRG LVL3 (GOWN DISPOSABLE) ×2 IMPLANT
GOWN STRL REUS W/ TWL XL LVL3 (GOWN DISPOSABLE) ×2 IMPLANT
GOWN STRL REUS W/TWL LRG LVL3 (GOWN DISPOSABLE) ×2
GOWN STRL REUS W/TWL XL LVL3 (GOWN DISPOSABLE) ×2
HANDPIECE INTERPULSE COAX TIP (DISPOSABLE)
KIT BASIN OR (CUSTOM PROCEDURE TRAY) ×1 IMPLANT
KIT TURNOVER KIT B (KITS) ×1 IMPLANT
MANIFOLD NEPTUNE II (INSTRUMENTS) ×1 IMPLANT
NDL HYPO 25GX1X1/2 BEV (NEEDLE) IMPLANT
NEEDLE HYPO 25GX1X1/2 BEV (NEEDLE) IMPLANT
NS IRRIG 1000ML POUR BTL (IV SOLUTION) ×1 IMPLANT
PACK ORTHO EXTREMITY (CUSTOM PROCEDURE TRAY) ×1 IMPLANT
PAD ARMBOARD 7.5X6 YLW CONV (MISCELLANEOUS) ×2 IMPLANT
SET CYSTO W/LG BORE CLAMP LF (SET/KITS/TRAYS/PACK) IMPLANT
SET HNDPC FAN SPRY TIP SCT (DISPOSABLE) IMPLANT
SPONGE T-LAP 18X18 ~~LOC~~+RFID (SPONGE) IMPLANT
STOCKINETTE IMPERVIOUS 9X36 MD (GAUZE/BANDAGES/DRESSINGS) ×1 IMPLANT
SUT ETHILON 3 0 PS 1 (SUTURE) IMPLANT
SUT PDS AB 2-0 CT1 27 (SUTURE) IMPLANT
SWAB CULTURE ESWAB REG 1ML (MISCELLANEOUS) IMPLANT
SYR CONTROL 10ML LL (SYRINGE) IMPLANT
TOWEL GREEN STERILE (TOWEL DISPOSABLE) ×1 IMPLANT
TOWEL GREEN STERILE FF (TOWEL DISPOSABLE) ×1 IMPLANT
TUBE CONNECTING 12X1/4 (SUCTIONS) ×1 IMPLANT
UNDERPAD 30X36 HEAVY ABSORB (UNDERPADS AND DIAPERS) ×1 IMPLANT
YANKAUER SUCT BULB TIP NO VENT (SUCTIONS) ×1 IMPLANT

## 2021-12-13 NOTE — Anesthesia Preprocedure Evaluation (Addendum)
Anesthesia Evaluation  Patient identified by MRN, date of birth, ID band Patient awake    Reviewed: Allergy & Precautions, NPO status , Patient's Chart, lab work & pertinent test results  Airway Mallampati: III  TM Distance: >3 FB Neck ROM: Full    Dental  (+) Teeth Intact, Dental Advisory Given   Pulmonary neg pulmonary ROS   breath sounds clear to auscultation       Cardiovascular hypertension,  Rhythm:Regular Rate:Normal     Neuro/Psych  negative psych ROS   GI/Hepatic negative GI ROS, Neg liver ROS,,,  Endo/Other  negative endocrine ROS    Renal/GU negative Renal ROS     Musculoskeletal  (+) Arthritis ,    Abdominal   Peds  Hematology negative hematology ROS (+)   Anesthesia Other Findings   Reproductive/Obstetrics                             Anesthesia Physical Anesthesia Plan  ASA: 3  Anesthesia Plan: General   Post-op Pain Management: Tylenol PO (pre-op)*   Induction: Intravenous  PONV Risk Score and Plan: 4 or greater and Ondansetron, Dexamethasone and Treatment may vary due to age or medical condition  Airway Management Planned: LMA  Additional Equipment: None  Intra-op Plan:   Post-operative Plan: Extubation in OR  Informed Consent: I have reviewed the patients History and Physical, chart, labs and discussed the procedure including the risks, benefits and alternatives for the proposed anesthesia with the patient or authorized representative who has indicated his/her understanding and acceptance.     Dental advisory given  Plan Discussed with: CRNA  Anesthesia Plan Comments:        Anesthesia Quick Evaluation

## 2021-12-13 NOTE — Progress Notes (Signed)
Pt w/ critical lab value, lactic acid 2.3. On-call provider, Haywood Filler, notified via page at 8:55pm.

## 2021-12-13 NOTE — Progress Notes (Signed)
PROGRESS NOTE    Sarah Walker  XFG:[id number] DOB: [birth year] DOA: 12/12/2021 PCP: [name], MD   Brief Narrative:  HPI: Sarah Walker is a 71 y.o. female with medical history significant of hypertension, hyperlipidemia, ovarian cancer in 2013 status post hysterectomy and oophorectomy in remission, chemotherapy neuropathy, provoked PE in 2013 who presents with cellulitis and abscess of left lower extremity.   Patient has been experiencing symptoms for about the last week and a half.  She saw her PCP initially and was diagnosed with cellulitis and started on Augmentin.  She did not have much improvement with this and had some worsening pain with weightbearing.  Due to this she presented to regional urgent care couple of days ago based on their initial evaluation she was continued on on Augmentin and based on their exam no I&D was pursued.  She followed up with her orthopedic office yesterday and with continued/worsening symptoms not responding to p.o. antibiotics they requested direct admission which was attempted but has taken too long to obtain a bed so patient presented to the ED for further evaluation.   Chart review shows that after speaking with orthopedics yesterday they did a ultrasound which showed no evidence of DVT but did show a deep abscess and this abscess traced towards the bone.     She denies fevers, chills, chest pain, shortness breath, abdominal pain, constipation, diarrhea, nausea, vomiting.     ED Course: Signs in the ED stable, blood pressure in the 967 systolic.  Lab workup including BMP and CBC and lactic acid are pending.  Blood cultures pending.  CT of the ankle is pending.  Ultrasound results from yesterday as above.  Orthopedics notified of patient arrival by the ED.  Initially Vanco and Zosyn were ordered in the ED but this will be narrowed.  Assessment & Plan:   Principal Problem:   Leg abscess Active Problems:   Hypertension   Cellulitis   Elevated serum  creatinine  Leg abscess/Cellulitis: Failed outpatient antibiotic therapy.  Ortho on board.  S/p irrigation and debridement of the left ankle/leg.  She is on pain medications.  Management per orthopedics.   AKI: Resolved.   Hypertension, essential: Controlled. - Continue home bisoprolol-hydrochlorothiazide, - Replace home with valsartan with formulary irbesartan   History of ovarian cancer in 2013 History of provoked PE in 2013 - Currently on surveillance for her ovarian cancer, status post hysterectomy and due for ectomy. - No longer on anticoagulation   DVT prophylaxis: enoxaparin (LOVENOX) injection 40 mg Start: 12/14/21 0800 SCDs Start: 12/13/21 0907 SCDs Start: 12/12/21 1534   Code Status: Full Code  Family Communication: Multiple family members present at bedside.  Plan of care discussed with patient in length and he/she verbalized understanding and agreed with it.  Status is: Observation The patient will require care spanning > 2 midnights and should be moved to inpatient because: She is going to need IV antibiotics and final culture report.   Estimated body mass index is 39.27 kg/m as calculated from the following:   Height as of this encounter: 5' (1.524 m).   Weight as of this encounter: 91.2 kg.    Nutritional Assessment: Body mass index is 39.27 kg/m.Marland Kitchen Seen by dietician.  I agree with the assessment and plan as outlined below: Nutrition Status:        . Skin Assessment: I have examined the patient's skin and I agree with the wound assessment as performed by the wound care RN as outlined below:  Consultants:  Orthopedics  Procedures:  As above  Antimicrobials:  Anti-infectives (From admission, onward)    Start     Dose/Rate Route Frequency Ordered Stop   12/13/21 0717  vancomycin (VANCOCIN) powder  Status:  Discontinued          As needed 12/13/21 0717 12/13/21 0820   12/13/21 0656  ceFAZolin (ANCEF) 2-4 GM/100ML-% IVPB       Note to Pharmacy:  Grace Blight M: cabinet override      12/13/21 0656 12/13/21 0747   12/13/21 0600  ceFAZolin (ANCEF) IVPB 2g/100 mL premix        2 g 200 mL/hr over 30 Minutes Intravenous On call to O.R. 12/12/21 1717 12/13/21 0751   12/12/21 1600  cefTRIAXone (ROCEPHIN) 2 g in sodium chloride 0.9 % 100 mL IVPB        2 g 200 mL/hr over 30 Minutes Intravenous Every 24 hours 12/12/21 1538     12/12/21 1515  vancomycin (VANCOCIN) IVPB 1000 mg/200 mL premix  Status:  Discontinued       See Hyperspace for full Linked Orders Report.   1,000 mg 200 mL/hr over 60 Minutes Intravenous  Once 12/12/21 1509 12/12/21 1538   12/12/21 1515  vancomycin (VANCOCIN) IVPB 1000 mg/200 mL premix  Status:  Discontinued       See Hyperspace for full Linked Orders Report.   1,000 mg 200 mL/hr over 60 Minutes Intravenous  Once 12/12/21 1509 12/12/21 1538   12/12/21 1500  piperacillin-tazobactam (ZOSYN) IVPB 3.375 g  Status:  Discontinued        3.375 g 100 mL/hr over 30 Minutes Intravenous  Once 12/12/21 1456 12/12/21 1538         Subjective: Seen and examined after the surgery.  Feels better other than mild pain.  Multiple family members at the bedside.  Objective: Vitals:   12/13/21 0820 12/13/21 0835 12/13/21 0850 12/13/21 0855  BP: 132/62 136/76 127/63 133/66  Pulse: 62 60 60 (!) 58  Resp: '12 10 15 13  '$ Temp: 97.7 F (36.5 C)  98 F (36.7 C) 97.9 F (36.6 C)  TempSrc:    Oral  SpO2: 92% 96% 92% 96%  Weight:      Height:        Intake/Output Summary (Last 24 hours) at 12/13/2021 1000 Last data filed at 12/13/2021 1324 Gross per 24 hour  Intake 852.9 ml  Output 12 ml  Net 840.9 ml   Filed Weights   12/12/21 1722 12/13/21 0708  Weight: 91.2 kg 91.2 kg    Examination:  General exam: Appears calm and comfortable  Respiratory system: Clear to auscultation. Respiratory effort normal. Cardiovascular system: S1 & S2 heard, RRR. No JVD, murmurs, rubs, gallops or clicks. No pedal edema. Gastrointestinal  system: Abdomen is nondistended, soft and nontender. No organomegaly or masses felt. Normal bowel sounds heard. Central nervous system: Alert and oriented. No focal neurological deficits. Extremities: Dressing and Ace wrap in the left lower extremity. Psychiatry: Judgement and insight appear normal. Mood & affect appropriate.    Data Reviewed: I have personally reviewed following labs and imaging studies  CBC: Recent Labs  Lab 12/12/21 1438 12/13/21 0402  WBC 8.0 6.5  NEUTROABS 5.7  --   HGB 11.9* 10.9*  HCT 37.2 33.3*  MCV 100.0 97.4  PLT 251 401   Basic Metabolic Panel: Recent Labs  Lab 12/12/21 1438 12/13/21 0402  NA 140 140  K 3.8 3.6  CL 109 108  CO2 23 21*  GLUCOSE 98 102*  BUN 21 16  CREATININE 1.29* 0.96  CALCIUM 8.9 8.7*   GFR: Estimated Creatinine Clearance: 53.4 mL/min (by C-G formula based on SCr of 0.96 mg/dL). Liver Function Tests: Recent Labs  Lab 12/13/21 0402  AST 18  ALT 12  ALKPHOS 37*  BILITOT 0.4  PROT 6.0*  ALBUMIN 2.6*   No results for input(s): "LIPASE", "AMYLASE" in the last 168 hours. No results for input(s): "AMMONIA" in the last 168 hours. Coagulation Profile: Recent Labs  Lab 12/13/21 0402  INR 1.0   Cardiac Enzymes: No results for input(s): "CKTOTAL", "CKMB", "CKMBINDEX", "TROPONINI" in the last 168 hours. BNP (last 3 results) No results for input(s): "PROBNP" in the last 8760 hours. HbA1C: No results for input(s): "HGBA1C" in the last 72 hours. CBG: No results for input(s): "GLUCAP" in the last 168 hours. Lipid Profile: No results for input(s): "CHOL", "HDL", "LDLCALC", "TRIG", "CHOLHDL", "LDLDIRECT" in the last 72 hours. Thyroid Function Tests: No results for input(s): "TSH", "T4TOTAL", "FREET4", "T3FREE", "THYROIDAB" in the last 72 hours. Anemia Panel: No results for input(s): "VITAMINB12", "FOLATE", "FERRITIN", "TIBC", "IRON", "RETICCTPCT" in the last 72 hours. Sepsis Labs: Recent Labs  Lab 12/12/21 1424  12/12/21 1805  LATICACIDVEN 1.5 2.3*    Recent Results (from the past 240 hour(s))  Surgical PCR screen     Status: None   Collection Time: 12/12/21  7:31 PM   Specimen: Nasal Mucosa; Nasal Swab  Result Value Ref Range Status   MRSA, PCR NEGATIVE NEGATIVE Final   Staphylococcus aureus NEGATIVE NEGATIVE Final    Comment: (NOTE) The Xpert SA Assay (FDA approved for NASAL specimens in patients 80 years of age and older), is one component of a comprehensive surveillance program. It is not intended to diagnose infection nor to guide or monitor treatment. Performed at [facility], [address] [place], [place] [address]      Radiology Studies: CT ANKLE LEFT W CONTRAST  Result Date: 12/12/2021 CLINICAL DATA:  Septic arthritis suspected. Soft tissue infection suspected. EXAM: CT OF THE LEFT ANKLE WITH CONTRAST TECHNIQUE: Multidetector CT imaging of the left ankle was performed following the standard protocol during bolus administration of intravenous contrast. RADIATION DOSE REDUCTION: This exam was performed according to the departmental dose-optimization program which includes automated exposure control, adjustment of the mA and/or kV according to patient size and/or use of iterative reconstruction technique. CONTRAST:  49m OMNIPAQUE IOHEXOL 350 MG/ML SOLN COMPARISON:  None Available. FINDINGS: Bones/Joint/Cartilage No evidence of fracture or dislocation. No cortical erosion or periosteal reaction. No appreciable joint effusion. Mild degenerative changes of the intertarsal and tarsometatarsal joints. Ligaments Suboptimally assessed by CT. Muscles and Tendons Muscles are normal in bulk. No intramuscular hematoma or fluid collection. Tendons of the flexor, extensor and peroneal compartments appear intact. Achilles tendon thickening suggesting tendinosis without evidence of tear. Soft tissues Subcutaneous soft tissue edema about the distal leg and ankle suggesting cellulitis. No fluid  collection or abscess. IMPRESSION: 1. Subcutaneous soft tissue edema about the distal leg and ankle suggesting cellulitis. No fluid collection or abscess. 2. No evidence of fracture or dislocation. No appreciable joint effusion. 3. Achilles tendinosis without evidence of tear. Electronically Signed   By: IKeane PoliceD.O.   On: 12/12/2021 20:51    Scheduled Meds:  acetaminophen  1,000 mg Oral Q6H   bisoprolol  5 mg Oral Daily   And   hydrochlorothiazide  6.25 mg Oral Daily   docusate sodium  100 mg Oral BID   [  START ON 12/14/2021] enoxaparin (LOVENOX) injection  40 mg Subcutaneous Q24H   fentaNYL       irbesartan  150 mg Oral Daily   mupirocin ointment  1 Application Nasal BID   sodium chloride flush  3 mL Intravenous Q12H   Continuous Infusions:  acetaminophen     cefTRIAXone (ROCEPHIN)  IV 2 g (12/12/21 2051)   fentaNYL     methocarbamol (ROBAXIN) IV       LOS: 0 days   Darliss Cheney, MD [facility]  12/13/2021, 10:00 AM   *Please note that this is a verbal dictation therefore any spelling or grammatical errors are due to the "[place] [employer]" system interpretation.  Please page via [place] and do not message via secure chat for urgent patient care matters. Secure chat can be used for non urgent patient care matters.  How to contact the [facility] Attending or Consulting provider [place] or covering provider during after hours [place], for this patient?  Check the care team in [facility] and look for a) attending/consulting [facility] provider listed and b) the [facility] team listed. Page or secure chat [phone]. Log into [email or web address] and use [place]'s universal password to access. If you do not have the password, please contact the hospital operator. Locate the [facility] provider you are looking for under [facility] and page to a number that you can be directly reached. If you still have difficulty reaching the provider, please page the [facility] (Director on Call) for the Hospitalists listed on  amion for assistance.

## 2021-12-13 NOTE — Op Note (Signed)
12/13/2021  8:14 AM  PATIENT:  Sarah Walker    PRE-OPERATIVE DIAGNOSIS:  Infected leg  POST-OPERATIVE DIAGNOSIS:  Same  PROCEDURE:  IRRIGATION AND DEBRIDEMENT ANKLE/ LEG  SURGEON:  Renette Butters, MD  ASSISTANT: Aggie Moats, PA-C, he was present and scrubbed throughout the case, critical for completion in a timely fashion, and for retraction, instrumentation, and closure.   ANESTHESIA:   gen  PREOPERATIVE INDICATIONS:  Sarah Walker is a  71 y.o. female with a diagnosis of Infected leg who failed conservative measures and elected for surgical management.    The risks benefits and alternatives were discussed with the patient preoperatively including but not limited to the risks of infection, bleeding, nerve injury, cardiopulmonary complications, the need for revision surgery, among others, and the patient was willing to proceed.  OPERATIVE IMPLANTS: none  OPERATIVE FINDINGS: purulent fluid  BLOOD LOSS: min  COMPLICATIONS: none  TOURNIQUET TIME: 50mn  OPERATIVE PROCEDURE:  Patient was identified in the preoperative holding area and site was marked by me She was transported to the operating theater and placed on the table in supine position taking care to pad all bony prominences. After a preincinduction time out anesthesia was induced. The left lower extremity was prepped and draped in normal sterile fashion and a pre-incision timeout was performed. She received ancef for preoperative antibiotics.   I made a longitudinal incision just anterior to her abscess.  I bluntly dissected down to the fluid collection there was some necrotic fat and pressurized fluid there I removed all of this probed with a hemostat and my finger to fully open the abscess space.  I debrided nonvital lysed tissue down to the level of muscle.  I then irrigated with 3 L  I placed Vanco powder  I performed a complex closure over a Penrose drain  POST OPERATIVE PLAN: Weightbearing as tolerated  p.o. antibiotics plan and then discharge okay for today if stable. Chemical dvt px and mobilization

## 2021-12-13 NOTE — Anesthesia Procedure Notes (Signed)
Procedure Name: LMA Insertion Date/Time: 12/13/2021 7:40 AM  Performed by: Harden Mo, CRNAPre-anesthesia Checklist: Patient identified, Emergency Drugs available, Suction available and Patient being monitored Patient Re-evaluated:Patient Re-evaluated prior to induction Oxygen Delivery Method: Circle System Utilized Preoxygenation: Pre-oxygenation with 100% oxygen Induction Type: IV induction LMA: LMA inserted LMA Size: 4.0 Number of attempts: 1 Airway Equipment and Method: Bite block Placement Confirmation: positive ETCO2 and breath sounds checked- equal and bilateral Tube secured with: Tape Dental Injury: Teeth and Oropharynx as per pre-operative assessment

## 2021-12-13 NOTE — Progress Notes (Signed)
Pt. Arrived to unit alert and oriented no c/o pain, husband notified via phone. Bed in lowest position call bell within reach

## 2021-12-13 NOTE — Interval H&P Note (Signed)
History and Physical Interval Note:  12/13/2021 7:26 AM  Sarah Walker  has presented today for surgery, with the diagnosis of Infected leg.  The various methods of treatment have been discussed with the patient and family. After consideration of risks, benefits and other options for treatment, the patient has consented to  Procedure(s): IRRIGATION AND DEBRIDEMENT ANKLE/ LEG (Left) as a surgical intervention.  The patient's history has been reviewed, patient examined, no change in status, stable for surgery.  I have reviewed the patient's chart and labs.  Questions were answered to the patient's satisfaction.     Renette Butters

## 2021-12-13 NOTE — Anesthesia Postprocedure Evaluation (Signed)
Anesthesia Post Note  Patient: Sarah Walker  Procedure(s) Performed: IRRIGATION AND DEBRIDEMENT ANKLE/ LEG (Left)     Patient location during evaluation: PACU Anesthesia Type: General Level of consciousness: awake and alert Pain management: pain level controlled Vital Signs Assessment: post-procedure vital signs reviewed and stable Respiratory status: spontaneous breathing, nonlabored ventilation, respiratory function stable and patient connected to nasal cannula oxygen Cardiovascular status: blood pressure returned to baseline and stable Postop Assessment: no apparent nausea or vomiting Anesthetic complications: no   No notable events documented.  Last Vitals:  Vitals:   12/13/21 0850 12/13/21 0855  BP: 127/63 133/66  Pulse: 60 (!) 58  Resp: 15 13  Temp: 36.7 C 36.6 C  SpO2: 92% 96%    Last Pain:  Vitals:   12/13/21 0855  TempSrc: Oral  PainSc:                  Effie Berkshire

## 2021-12-13 NOTE — Transfer of Care (Signed)
Immediate Anesthesia Transfer of Care Note  Patient: Sarah Walker  Procedure(s) Performed: IRRIGATION AND DEBRIDEMENT ANKLE/ LEG (Left)  Patient Location: PACU  Anesthesia Type:General  Level of Consciousness: awake, alert , and oriented  Airway & Oxygen Therapy: Patient Spontanous Breathing  Post-op Assessment: Report given to RN, Post -op Vital signs reviewed and stable, and Patient moving all extremities X 4  Post vital signs: Reviewed and stable  Last Vitals:  Vitals Value Taken Time  BP 132/62   Temp    Pulse 68   Resp 14   SpO2 95     Last Pain:  Vitals:   12/13/21 0708  TempSrc: Oral  PainSc:          Complications: No notable events documented.

## 2021-12-13 NOTE — Discharge Instructions (Signed)

## 2021-12-13 NOTE — Plan of Care (Signed)

## 2021-12-14 ENCOUNTER — Encounter (HOSPITAL_COMMUNITY): Payer: Self-pay | Admitting: Orthopedic Surgery

## 2021-12-14 LAB — CBC
HCT: 32.2 % — ABNORMAL LOW (ref 36.0–46.0)
Hemoglobin: 10.2 g/dL — ABNORMAL LOW (ref 12.0–15.0)
MCH: 31.3 pg (ref 26.0–34.0)
MCHC: 31.7 g/dL (ref 30.0–36.0)
MCV: 98.8 fL (ref 80.0–100.0)
Platelets: 241 10*3/uL (ref 150–400)
RBC: 3.26 MIL/uL — ABNORMAL LOW (ref 3.87–5.11)
RDW: 13.8 % (ref 11.5–15.5)
WBC: 8 10*3/uL (ref 4.0–10.5)
nRBC: 0 % (ref 0.0–0.2)

## 2021-12-14 LAB — BASIC METABOLIC PANEL
Anion gap: 9 (ref 5–15)
BUN: 22 mg/dL (ref 8–23)
CO2: 24 mmol/L (ref 22–32)
Calcium: 8.5 mg/dL — ABNORMAL LOW (ref 8.9–10.3)
Chloride: 106 mmol/L (ref 98–111)
Creatinine, Ser: 1.11 mg/dL — ABNORMAL HIGH (ref 0.44–1.00)
GFR, Estimated: 53 mL/min — ABNORMAL LOW (ref >60)
Glucose, Bld: 118 mg/dL — ABNORMAL HIGH (ref 70–99)
Potassium: 4.1 mmol/L (ref 3.5–5.1)
Sodium: 139 mmol/L (ref 135–145)

## 2021-12-14 MED ORDER — DOXYCYCLINE HYCLATE 50 MG PO CAPS: 100.0000 mg | ORAL_CAPSULE | Freq: Two times a day (BID) | ORAL | 0 refills | Status: AC

## 2021-12-14 MED ORDER — CEPHALEXIN 500 MG PO CAPS: 500.0000 mg | ORAL_CAPSULE | Freq: Four times a day (QID) | ORAL | 0 refills | Status: DC

## 2021-12-14 MED ORDER — OXYCODONE HCL 5 MG PO TABS: 5.0000 mg | ORAL_TABLET | ORAL | 0 refills | Status: DC | PRN

## 2021-12-14 NOTE — Progress Notes (Signed)
PT Screen  Patient Details Name: PETRITA BLUNCK MRN: [id number] DOB: 12/18/1950   Cancelled Treatment:    Reason Eval/Treat Not Completed: PT screened, no needs identified, will sign off Spoke with OT who reports pt was mod I with transfers and ambulation with RW.  She also performed 5 steps. Pt has all DME from prior [facility].  No PT needs. Will sign off. Abran Richard, PT [facility] [phone]   Karlton Lemon 12/14/2021, 10:15 AM

## 2021-12-14 NOTE — Discharge Summary (Signed)
Physician Discharge Summary  Sarah Walker VQQ:[id number] DOB: 1950/01/07 DOA: 12/12/2021  PCP: [name], MD  Admit date: 12/12/2021 Discharge date: 12/14/2021 30 Day Unplanned Readmission Risk Score    Flowsheet Row ED to Hosp-Admission (Current) from 12/12/2021 in [place]  30 Day Unplanned Readmission Risk Score (%) 16.52 Filed at 12/14/2021 0801       This score is the patient's risk of an unplanned readmission within 30 days of being discharged (0 -100%). The score is based on dignosis, age, lab data, medications, orders, and past utilization.   Low:  0-14.9   Medium: 15-21.9   High: 22-29.9   Extreme: 30 and above          Admitted From: Home Disposition: Home  Recommendations for Outpatient Follow-up:  Follow up with PCP in 1-2 weeks Please obtain BMP/CBC in one week Follow-up with Dr. Percell Miller as scheduled Please follow up with your PCP on the following pending results: Unresulted Labs (From admission, onward)    None         Home Health: None Equipment/Devices: None  Discharge Condition: Stable CODE STATUS: Full code Diet recommendation: Cardiac  HPI: Sarah Walker is a 71 y.o. female with medical history significant of hypertension, hyperlipidemia, ovarian cancer in 2013 status post hysterectomy and oophorectomy in remission, chemotherapy neuropathy, provoked PE in 2013 who presents with cellulitis and abscess of left lower extremity.   Patient has been experiencing symptoms for about the last week and a half.  She saw her PCP initially and was diagnosed with cellulitis and started on Augmentin.  She did not have much improvement with this and had some worsening pain with weightbearing.  Due to this she presented to regional urgent care couple of days ago based on their initial evaluation she was continued on on Augmentin and based on their exam no I&D was pursued.  She followed up with her orthopedic office yesterday  and with continued/worsening symptoms not responding to p.o. antibiotics they requested direct admission which was attempted but has taken too long to obtain a bed so patient presented to the ED for further evaluation.   Chart review shows that after speaking with orthopedics yesterday they did a ultrasound which showed no evidence of DVT but did show a deep abscess and this abscess traced towards the bone.     She denies fevers, chills, chest pain, shortness breath, abdominal pain, constipation, diarrhea, nausea, vomiting.     ED Course: Signs in the ED stable, blood pressure in the 433 systolic.  Lab workup including BMP and CBC and lactic acid are pending.  Blood cultures pending.  CT of the ankle is pending.  Ultrasound results from yesterday as above.  Orthopedics notified of patient arrival by the ED.  Initially Vanco and Zosyn were ordered in the ED but this will be narrowed.  Subjective: Seen and examined.  Feels well.  No complaints.  Ready to go home.  Brief/Interim Summary:   Leg abscess/Cellulitis: Failed outpatient antibiotic therapy.  Ortho on board.  She was started on Rocephin after hospitalization, s/p irrigation and debridement of the left ankle/leg on 12/13/2021.  She is doing well at has been cleared for orthopedics for discharge.  She was also assessed by PT OT and no needs were identified.  Ortho has prescribed her doxycycline and Keflex.   AKI: Resolved.   Hypertension, essential: Controlled.  Resume home medications.   History of ovarian cancer in 2013 History of provoked  PE in 2013 - Currently on surveillance for her ovarian cancer, status post hysterectomy and due for ectomy. - No longer on anticoagulation  Discharge plan was discussed with patient and/or family member and they verbalized understanding and agreed with it.  Discharge Diagnoses:  Principal Problem:   Abscess of left leg Active Problems:   Hypertension   Cellulitis   Elevated serum creatinine    Leg abscess    Discharge Instructions   Allergies as of 12/14/2021       Reactions   Codeine Nausea And Vomiting   Not right away after a couple days of taking   Erythromycin Itching, Other (See Comments)        Medication List     TAKE these medications    acetaminophen 500 MG tablet Commonly known as: TYLENOL Take 1,000 mg by mouth daily.   aspirin EC 81 MG tablet Take 81 mg by mouth daily. Swallow whole.   azelastine 0.05 % ophthalmic solution Commonly known as: OPTIVAR Place 1 drop into both eyes daily as needed (rag weed).   bismuth subsalicylate 124 PY/09XI suspension Commonly known as: PEPTO BISMOL Take 30 mLs by mouth every 6 (six) hours as needed for indigestion.   bisoprolol-hydrochlorothiazide 5-6.25 MG tablet Commonly known as: ZIAC Take 1 tablet by mouth daily.   CALCIUM 500 PO Take 1,000 mg by mouth daily. Chewable   cephALEXin 500 MG capsule Commonly known as: Keflex Take 1 capsule (500 mg total) by mouth every 6 (six) hours.   clotrimazole-betamethasone cream Commonly known as: LOTRISONE SMARTSIG:Topical Morning-Evening   Desoximetasone 0.05 % Oint SMARTSIG:Liberally Topical Daily   dicyclomine 10 MG capsule Commonly known as: BENTYL Take 10 mg by mouth 4 (four) times daily as needed.   doxycycline 50 MG capsule Commonly known as: VIBRAMYCIN Take 2 capsules (100 mg total) by mouth 2 (two) times daily for 7 days.   Dupixent 300 MG/2ML Sopn Generic drug: Dupilumab Inject into the skin.   fexofenadine 180 MG tablet Commonly known as: ALLEGRA Take 180 mg by mouth daily as needed for allergies or rhinitis.   Fluocinolone Acetonide Scalp 0.01 % Oil 2 (two) times daily. as directed   fluticasone 50 MCG/ACT nasal spray Commonly known as: FLONASE Place 1 spray into both nostrils daily as needed for allergies or rhinitis.   furosemide 40 MG tablet Commonly known as: LASIX Take 40 mg by mouth daily as needed.   Klor-Con M20 20 MEQ  tablet Generic drug: potassium chloride SA TAKE 1 TABLET BY MOUTH EVERY DAY What changed: how much to take   meloxicam 15 MG tablet Commonly known as: MOBIC See admin instructions. PLEASE SEE ATTACHED FOR DETAILED DIRECTIONS   mometasone 0.1 % ointment Commonly known as: ELOCON Apply topically.   mupirocin ointment 2 % Commonly known as: BACTROBAN SMARTSIG:Sparingly Topical 3 Times Daily   OMEGA-3 COMPLEX PO Take 4 capsules by mouth daily. Omega xl   oxyCODONE 5 MG immediate release tablet Commonly known as: Roxicodone Take 1 tablet (5 mg total) by mouth every 4 (four) hours as needed for severe pain.   tacrolimus 0.1 % ointment Commonly known as: PROTOPIC Apply  as directed to affected area twice a day   valsartan 160 MG tablet Commonly known as: DIOVAN Take 160 mg by mouth daily.   Vitamin B-12 2500 MCG Subl Place 2,500 mcg under the tongue daily.   VITAMIN C PO Take 4 tablets by mouth daily. Chewable   VITAMIN D3 PO Take 1 capsule by mouth  daily.   VITAMIN K2 PO Take 1 capsule by mouth daily. MK7   zinc gluconate 50 MG tablet Take 50 mg by mouth daily.   Zinc Oxide 40 % Pste Apply 1 application topically daily as needed.        Follow-up Information     Renette Butters, MD. Schedule an appointment as soon as possible for a visit in 1 week(s).   Specialty: Orthopedic Surgery Contact information: [address] [place] [address] [phone]         [name], MD Follow up in 1 week(s).   Specialty: Family Medicine Contact information: [place] [address] [phone]                Allergies  Allergen Reactions   Codeine Nausea And Vomiting    Not right away after a couple days of taking   Erythromycin Itching and Other (See Comments)    Consultations: Dressing and Ace wrap in the left lower extremity.   Procedures/Studies: CT ANKLE LEFT W CONTRAST  Result Date:  12/12/2021 CLINICAL DATA:  Septic arthritis suspected. Soft tissue infection suspected. EXAM: CT OF THE LEFT ANKLE WITH CONTRAST TECHNIQUE: Multidetector CT imaging of the left ankle was performed following the standard protocol during bolus administration of intravenous contrast. RADIATION DOSE REDUCTION: This exam was performed according to the departmental dose-optimization program which includes automated exposure control, adjustment of the mA and/or kV according to patient size and/or use of iterative reconstruction technique. CONTRAST:  48m OMNIPAQUE IOHEXOL 350 MG/ML SOLN COMPARISON:  None Available. FINDINGS: Bones/Joint/Cartilage No evidence of fracture or dislocation. No cortical erosion or periosteal reaction. No appreciable joint effusion. Mild degenerative changes of the intertarsal and tarsometatarsal joints. Ligaments Suboptimally assessed by CT. Muscles and Tendons Muscles are normal in bulk. No intramuscular hematoma or fluid collection. Tendons of the flexor, extensor and peroneal compartments appear intact. Achilles tendon thickening suggesting tendinosis without evidence of tear. Soft tissues Subcutaneous soft tissue edema about the distal leg and ankle suggesting cellulitis. No fluid collection or abscess. IMPRESSION: 1. Subcutaneous soft tissue edema about the distal leg and ankle suggesting cellulitis. No fluid collection or abscess. 2. No evidence of fracture or dislocation. No appreciable joint effusion. 3. Achilles tendinosis without evidence of tear. Electronically Signed   By: IKeane PoliceD.O.   On: 12/12/2021 20:51   UKoreaVenous Img Lower Unilateral Left (DVT)  Result Date: 12/07/2021 CLINICAL DATA:  Left leg EXAM: LEFT LOWER EXTREMITY VENOUS DOPPLER ULTRASOUND TECHNIQUE: Gray-scale sonography with compression, as well as color and duplex ultrasound, were performed to evaluate the deep venous system(s) from the level of the common femoral vein through the popliteal and proximal calf  veins. COMPARISON:  None Available. FINDINGS: VENOUS Normal compressibility of the common femoral, superficial femoral, and popliteal veins, with limited visualization of the calf veins. Visualized portions of profunda femoral vein and great saphenous vein unremarkable. No filling defects to suggest DVT on grayscale or color Doppler imaging. Doppler waveforms show normal direction of venous flow, normal respiratory plasticity and response to augmentation. Limited views of the contralateral common femoral vein are unremarkable. OTHER None. Limitations: none IMPRESSION: Limited visualization of the veins of the calf. Within this limitation, no evidence of left lower extremity DVT. Electronically Signed   By: AMerilyn BabaM.D.   On: 12/07/2021 11:03     Discharge Exam: Vitals:   12/14/21 0524 12/14/21 0744  BP: 105/60 112/61  Pulse: (!) 55 (!) 55  Resp: 16   Temp: (!) 97.5 F (36.4 C) 97.6 F (36.4 C)  SpO2: 98% 98%   Vitals:   12/13/21 2035 12/13/21 2037 12/14/21 0524 12/14/21 0744  BP:  (!) 107/56 105/60 112/61  Pulse:  (!) 58 (!) 55 (!) 55  Resp:  18 16   Temp: 98 F (36.7 C) 97.8 F (36.6 C) (!) 97.5 F (36.4 C) 97.6 F (36.4 C)  TempSrc: Oral Oral Oral Oral  SpO2:  98% 98% 98%  Weight:      Height:        General: Pt is alert, awake, not in acute distress Cardiovascular: RRR, S1/S2 +, no rubs, no gallops Respiratory: CTA bilaterally, no wheezing, no rhonchi Abdominal: Soft, NT, ND, bowel sounds + Extremities: no edema, no cyanosis    The results of significant diagnostics from this hospitalization (including imaging, microbiology, ancillary and laboratory) are listed below for reference.     Microbiology: Recent Results (from the past 240 hour(s))  Blood culture (routine x 2)     Status: None (Preliminary result)   Collection Time: 12/12/21  2:34 PM   Specimen: BLOOD  Result Value Ref Range Status   Specimen Description BLOOD SITE NOT SPECIFIED  Final   Special  Requests   Final    BOTTLES DRAWN AEROBIC ONLY Blood Culture results may not be optimal due to an inadequate volume of blood received in culture bottles   Culture   Final    NO GROWTH 2 DAYS Performed at [facility], [place] [address] [place], [place] [address]    Report Status PENDING  Incomplete  Blood culture (routine x 2)     Status: None (Preliminary result)   Collection Time: 12/12/21  6:05 PM   Specimen: BLOOD LEFT HAND  Result Value Ref Range Status   Specimen Description BLOOD LEFT HAND  Final   Special Requests   Final    BOTTLES DRAWN AEROBIC AND ANAEROBIC Blood Culture adequate volume   Culture   Final    NO GROWTH 2 DAYS Performed at [facility], [place] [address] [place], [place] [address]    Report Status PENDING  Incomplete  Surgical PCR screen     Status: None   Collection Time: 12/12/21  7:31 PM   Specimen: Nasal Mucosa; Nasal Swab  Result Value Ref Range Status   MRSA, PCR NEGATIVE NEGATIVE Final   Staphylococcus aureus NEGATIVE NEGATIVE Final    Comment: (NOTE) The Xpert SA Assay (FDA approved for NASAL specimens in patients 89 years of age and older), is one component of a comprehensive surveillance program. It is not intended to diagnose infection nor to guide or monitor treatment. Performed at [facility], [place] [address], [place], [place] [address]   Aerobic/Anaerobic Culture w Gram Stain (surgical/deep wound)     Status: None (Preliminary result)   Collection Time: 12/13/21  8:04 AM   Specimen: PATH Other; Tissue  Result Value Ref Range Status   Specimen Description TISSUE  Final   Special Requests LEFT LOWER LEG  Final   Gram Stain   Final    FEW WBC PRESENT, PREDOMINANTLY PMN NO ORGANISMS SEEN    Culture   Final    CULTURE REINCUBATED FOR BETTER GROWTH Performed at [facility], [place] [address] [place], [place] [address]    Report Status PENDING  Incomplete     Labs: BNP (last 3 results) No results for input(s):  "BNP" in the last  8760 hours. Basic Metabolic Panel: Recent Labs  Lab 12/12/21 1438 12/13/21 0402 12/14/21 0356  NA 140 140 139  K 3.8 3.6 4.1  CL 109 108 106  CO2 23 21* 24  GLUCOSE 98 102* 118*  BUN '21 16 22  '$ CREATININE 1.29* 0.96 1.11*  CALCIUM 8.9 8.7* 8.5*   Liver Function Tests: Recent Labs  Lab 12/13/21 0402  AST 18  ALT 12  ALKPHOS 37*  BILITOT 0.4  PROT 6.0*  ALBUMIN 2.6*   No results for input(s): "LIPASE", "AMYLASE" in the last 168 hours. No results for input(s): "AMMONIA" in the last 168 hours. CBC: Recent Labs  Lab 12/12/21 1438 12/13/21 0402 12/14/21 0356  WBC 8.0 6.5 8.0  NEUTROABS 5.7  --   --   HGB 11.9* 10.9* 10.2*  HCT 37.2 33.3* 32.2*  MCV 100.0 97.4 98.8  PLT 251 229 241   Cardiac Enzymes: No results for input(s): "CKTOTAL", "CKMB", "CKMBINDEX", "TROPONINI" in the last 168 hours. BNP: Invalid input(s): "POCBNP" CBG: No results for input(s): "GLUCAP" in the last 168 hours. D-Dimer No results for input(s): "DDIMER" in the last 72 hours. Hgb A1c No results for input(s): "HGBA1C" in the last 72 hours. Lipid Profile No results for input(s): "CHOL", "HDL", "LDLCALC", "TRIG", "CHOLHDL", "LDLDIRECT" in the last 72 hours. Thyroid function studies No results for input(s): "TSH", "T4TOTAL", "T3FREE", "THYROIDAB" in the last 72 hours.  Invalid input(s): "FREET3" Anemia work up No results for input(s): "VITAMINB12", "FOLATE", "FERRITIN", "TIBC", "IRON", "RETICCTPCT" in the last 72 hours. Urinalysis No results found for: "COLORURINE", "APPEARANCEUR", "LABSPEC", "PHURINE", "GLUCOSEU", "HGBUR", "BILIRUBINUR", "KETONESUR", "PROTEINUR", "UROBILINOGEN", "NITRITE", "LEUKOCYTESUR" Sepsis Labs Recent Labs  Lab 12/12/21 1438 12/13/21 0402 12/14/21 0356  WBC 8.0 6.5 8.0   Microbiology Recent Results (from the past 240 hour(s))  Blood culture (routine x 2)     Status: None (Preliminary result)   Collection Time: 12/12/21  2:34 PM   Specimen: BLOOD   Result Value Ref Range Status   Specimen Description BLOOD SITE NOT SPECIFIED  Final   Special Requests   Final    BOTTLES DRAWN AEROBIC ONLY Blood Culture results may not be optimal due to an inadequate volume of blood received in culture bottles   Culture   Final    NO GROWTH 2 DAYS Performed at [facility], [place] [address], [place], [place] [address]    Report Status PENDING  Incomplete  Blood culture (routine x 2)     Status: None (Preliminary result)   Collection Time: 12/12/21  6:05 PM   Specimen: BLOOD LEFT HAND  Result Value Ref Range Status   Specimen Description BLOOD LEFT HAND  Final   Special Requests   Final    BOTTLES DRAWN AEROBIC AND ANAEROBIC Blood Culture adequate volume   Culture   Final    NO GROWTH 2 DAYS Performed at [facility], [place] [address] [place], [place] [address]    Report Status PENDING  Incomplete  Surgical PCR screen     Status: None   Collection Time: 12/12/21  7:31 PM   Specimen: Nasal Mucosa; Nasal Swab  Result Value Ref Range Status   MRSA, PCR NEGATIVE NEGATIVE Final   Staphylococcus aureus NEGATIVE NEGATIVE Final    Comment: (NOTE) The Xpert SA Assay (FDA approved for NASAL specimens in patients 56 years of age and older), is one component of a comprehensive surveillance program. It is not intended to diagnose infection nor to guide or monitor treatment. Performed  at [facility], [place] [address] [place], [place] [address]   Aerobic/Anaerobic Culture w Gram Stain (surgical/deep wound)     Status: None (Preliminary result)   Collection Time: 12/13/21  8:04 AM   Specimen: PATH Other; Tissue  Result Value Ref Range Status   Specimen Description TISSUE  Final   Special Requests LEFT LOWER LEG  Final   Gram Stain   Final    FEW WBC PRESENT, PREDOMINANTLY PMN NO ORGANISMS SEEN    Culture   Final    CULTURE REINCUBATED FOR BETTER GROWTH Performed at [facility], [place] [address], [place], [place] [address]     Report Status PENDING  Incomplete     Time coordinating discharge: Over 30 minutes  SIGNED:   Darliss Cheney, MD  [facility] 12/14/2021, 10:00 AM *Please note that this is a verbal dictation therefore any spelling or grammatical errors are due to the "[place] One" system interpretation. If 7PM-7AM, please contact night-coverage [email or web address]

## 2021-12-14 NOTE — Progress Notes (Signed)
Per patient request postop boat was not order, she stated "I will not wear it, I refused it before the surgery in MD office and I do not want to be charged for it".

## 2021-12-14 NOTE — Evaluation (Signed)
Occupational Therapy Evaluation/Discharge Patient Details Name: Sarah Walker MRN: [id number] DOB: 07/25/1950 Today's Date: 12/14/2021   History of Present Illness Pt is a 71 y/o female admitted for I&D of L ankle 12/10 due to infection. PMH: ovarian CA, neuropathy, R THA 08/2020   Clinical Impression   PTA, pt lives with spouse, typically Modified Independent with ADLs, IADLs and mobility since R THA last year. Pt presents now with pain well controlled and preference to use RW to alleviate pressure of L LE w/ mobility. Pt overall Modified Independent for ADLs (dressing in prep for DC), hallway mobility using RW and stair mgmt. No increased pain or safety concerns noted. Pt also declines need for post op boot. No further skilled OT services needed at acute level or on DC. Pt functionally appropriate for DC home once medically cleared. OT to sign off.      Recommendations for follow up therapy are one component of a multi-disciplinary discharge planning process, led by the attending physician.  Recommendations may be updated based on patient status, additional functional criteria and insurance authorization.   Follow Up Recommendations  No OT follow up     Assistance Recommended at Discharge PRN  Patient can return home with the following      Functional Status Assessment  Patient has not had a recent decline in their functional status  Equipment Recommendations  None recommended by OT    Recommendations for Other Services       Precautions / Restrictions Precautions Precautions: None Precaution Comments: per nursing, pt declined post op boot Restrictions Weight Bearing Restrictions: No      Mobility Bed Mobility Overal bed mobility: Modified Independent                  Transfers Overall transfer level: Modified independent Equipment used: Rolling walker (2 wheels)                      Balance Overall balance assessment: Modified Independent (use of RW  for alleviating pressure from L ankle but overall MOD I)                                         ADL either performed or assessed with clinical judgement   ADL Overall ADL's : Modified independent                                       General ADL Comments: able to perform full dressing tasks in prep for DC without issue, ambulate in hallway with RW MOD I, no LOB and perform 5 steps with method pt has been using since R hip replacement last year. Denies any concerns or issues     Vision Ability to See in Adequate Light: 0 Adequate Patient Visual Report: No change from baseline Vision Assessment?: No apparent visual deficits     Perception     Praxis      Pertinent Vitals/Pain Pain Assessment Pain Assessment: 0-10 Pain Score: 0-No pain Pain Intervention(s): Premedicated before session, Monitored during session     Hand Dominance Right   Extremity/Trunk Assessment Upper Extremity Assessment Upper Extremity Assessment: Overall WFL for tasks assessed   Lower Extremity Assessment Lower Extremity Assessment: LLE deficits/detail LLE Deficits / Details: s/p I&D, reports pain much improved, ROM [facility]  Cervical / Trunk Assessment Cervical / Trunk Assessment: Normal   Communication Communication Communication: No difficulties   Cognition Arousal/Alertness: Awake/alert Behavior During Therapy: WFL for tasks assessed/performed Overall Cognitive Status: Within Functional Limits for tasks assessed                                       General Comments  Daughter present    Exercises     Shoulder Instructions      Home Living Family/patient expects to be discharged to:: Private residence Living Arrangements: Spouse/significant other Available Help at Discharge: Family;Available 24 hours/day Type of Home: House Home Access: Stairs to enter [employer] of Steps: 1   Home Layout: Multi-level;Other (Comment) (split  level) Alternate Level Stairs-Number of Steps: 4 Alternate Level Stairs-Rails: Right Bathroom Shower/Tub: Walk-in shower   Bathroom Toilet: Handicapped height     Home Equipment: Cane - single point;Rollator (4 wheels);Rolling Walker (2 wheels);Cane - quad;Shower seat;Adaptive equipment Adaptive Equipment: Reacher;Sock aid        Prior Functioning/Environment Prior Level of Function : Independent/Modified Independent             Mobility Comments: hip replacement last year, sometimes will use cane for certain tasks but overall independent ADLs Comments: MOD I for ADLs, using AE for LB dressing        OT Problem List: Pain      OT Treatment/Interventions:      OT Goals(Current goals can be found in the care plan section) Acute Rehab OT Goals Patient Stated Goal: go home as soon as possible today OT Goal Formulation: All assessment and education complete, DC therapy  OT Frequency:      Co-evaluation              AM-PAC OT "6 Clicks" Daily Activity     Outcome Measure Help from another person eating meals?: None Help from another person taking care of personal grooming?: None Help from another person toileting, which includes using toliet, bedpan, or urinal?: None Help from another person bathing (including washing, rinsing, drying)?: None Help from another person to put on and taking off regular upper body clothing?: None Help from another person to put on and taking off regular lower body clothing?: None 6 Click Score: 24   End of Session Equipment Utilized During Treatment: Rolling walker (2 wheels) Nurse Communication: Mobility status  Activity Tolerance: Patient tolerated treatment well Patient left: in bed;with call bell/phone within reach  OT Visit Diagnosis: Pain Pain - Right/Left: Left Pain - part of body: Ankle and joints of foot                Time: 8177-1165 OT Time Calculation (min): 16 min Charges:  OT General Charges $OT Visit: 1  Visit OT Evaluation $OT Eval Low Complexity: 1 Low  Malachy Chamber, OTR/L Acute Rehab Services Office: [phone]   Layla Maw 12/14/2021, 9:51 AM

## 2021-12-14 NOTE — Plan of Care (Signed)
  Problem: Health Behavior/Discharge Planning: Goal: Ability to manage health-related needs will improve Outcome: Progressing   Problem: Clinical Measurements: Goal: Ability to maintain clinical measurements within normal limits will improve Outcome: Progressing Goal: Will remain free from infection Outcome: Progressing Goal: Diagnostic test results will improve Outcome: Progressing   

## 2021-12-14 NOTE — Progress Notes (Signed)
Subjective: 1 Day Post-Op s/p Procedure(s): IRRIGATION AND DEBRIDEMENT ANKLE/ LEG   Patient is alert, oriented. Anxious to get home. Pain so far under good control, is painful when weight bearing but pain is manageable. Denies chest pain, SOB, Calf pain. No nausea/vomiting. No other complaints.     Objective:  PE: VITALS:   Vitals:   12/13/21 2035 12/13/21 2037 12/14/21 0524 12/14/21 0744  BP:  (!) 107/56 105/60 112/61  Pulse:  (!) 58 (!) 55 (!) 55  Resp:  18 16   Temp: 98 F (36.7 C) 97.8 F (36.6 C) (!) 97.5 F (36.4 C) 97.6 F (36.4 C)  TempSrc: Oral Oral Oral Oral  SpO2:  98% 98% 98%  Weight:      Height:       General:sitting up in bed in no acute distress Resp: normal respiratory effort MSK: Sensation intact distally. 2+ pulse. Dorsiflexion and plantarflexion intact. Dressings CDI. Calf soft and compressible.  LABS  Results for orders placed or performed during the hospital encounter of 12/12/21 (from the past 24 hour(s))  Lactic acid, plasma     Status: None   Collection Time: 12/13/21  9:19 AM  Result Value Ref Range   Lactic Acid, Venous 1.9 0.5 - 1.9 mmol/L  CBC     Status: Abnormal   Collection Time: 12/14/21  3:56 AM  Result Value Ref Range   WBC 8.0 4.0 - 10.5 K/uL   RBC 3.26 (L) 3.87 - 5.11 MIL/uL   Hemoglobin 10.2 (L) 12.0 - 15.0 g/dL   HCT 32.2 (L) 36.0 - 46.0 %   MCV 98.8 80.0 - 100.0 fL   MCH 31.3 26.0 - 34.0 pg   MCHC 31.7 30.0 - 36.0 g/dL   RDW 13.8 11.5 - 15.5 %   Platelets 241 150 - 400 K/uL   nRBC 0.0 0.0 - 0.2 %  Basic metabolic panel     Status: Abnormal   Collection Time: 12/14/21  3:56 AM  Result Value Ref Range   Sodium 139 135 - 145 mmol/L   Potassium 4.1 3.5 - 5.1 mmol/L   Chloride 106 98 - 111 mmol/L   CO2 24 22 - 32 mmol/L   Glucose, Bld 118 (H) 70 - 99 mg/dL   BUN 22 8 - 23 mg/dL   Creatinine, Ser 1.11 (H) 0.44 - 1.00 mg/dL   Calcium 8.5 (L) 8.9 - 10.3 mg/dL   GFR, Estimated 53 (L) >60 mL/min   Anion gap 9 5 - 15     CT ANKLE LEFT W CONTRAST  Result Date: 12/12/2021 CLINICAL DATA:  Septic arthritis suspected. Soft tissue infection suspected. EXAM: CT OF THE LEFT ANKLE WITH CONTRAST TECHNIQUE: Multidetector CT imaging of the left ankle was performed following the standard protocol during bolus administration of intravenous contrast. RADIATION DOSE REDUCTION: This exam was performed according to the departmental dose-optimization program which includes automated exposure control, adjustment of the mA and/or kV according to patient size and/or use of iterative reconstruction technique. CONTRAST:  17m OMNIPAQUE IOHEXOL 350 MG/ML SOLN COMPARISON:  None Available. FINDINGS: Bones/Joint/Cartilage No evidence of fracture or dislocation. No cortical erosion or periosteal reaction. No appreciable joint effusion. Mild degenerative changes of the intertarsal and tarsometatarsal joints. Ligaments Suboptimally assessed by CT. Muscles and Tendons Muscles are normal in bulk. No intramuscular hematoma or fluid collection. Tendons of the flexor, extensor and peroneal compartments appear intact. Achilles tendon thickening suggesting tendinosis without evidence of tear. Soft tissues Subcutaneous soft tissue edema about  the distal leg and ankle suggesting cellulitis. No fluid collection or abscess. IMPRESSION: 1. Subcutaneous soft tissue edema about the distal leg and ankle suggesting cellulitis. No fluid collection or abscess. 2. No evidence of fracture or dislocation. No appreciable joint effusion. 3. Achilles tendinosis without evidence of tear. Electronically Signed   By: Keane Police D.O.   On: 12/12/2021 20:51      Assessment/Plan: Abscess of left leg 1 Day Post-Op s/p Procedure(s): IRRIGATION AND DEBRIDEMENT ANKLE/ LEG  Weightbearing: WBAT BLE Insicional and dressing care: reinforce as needed ID: Rocephin 2 g q 24 hrs, ok to send home on oral antibiotics. Cultures not back yet.  Pain control: continue current  regimen Follow - up plan: FU with Dr. Percell Miller on Wednesday Dispo: ok to discharge home on oral antibiotics today per orthopedics  Contact information:   Merlene Pulling, PA-C Weekdays 8-5  After hours and holidays please check [email or web address] for group call information for [facility]  Ventura Bruns 12/14/2021, 8:30 AM

## 2021-12-15 DIAGNOSIS — L2089 Other atopic dermatitis: Secondary | ICD-10-CM | POA: Diagnosis not present

## 2021-12-16 DIAGNOSIS — M71071 Abscess of bursa, right ankle and foot: Secondary | ICD-10-CM | POA: Diagnosis not present

## 2021-12-17 LAB — CULTURE, BLOOD (ROUTINE X 2)
Culture: NO GROWTH
Culture: NO GROWTH
Special Requests: ADEQUATE

## 2021-12-18 LAB — AEROBIC/ANAEROBIC CULTURE W GRAM STAIN (SURGICAL/DEEP WOUND)

## 2021-12-23 DIAGNOSIS — M71071 Abscess of bursa, right ankle and foot: Secondary | ICD-10-CM | POA: Diagnosis not present

## 2021-12-30 DIAGNOSIS — M71071 Abscess of bursa, right ankle and foot: Secondary | ICD-10-CM | POA: Diagnosis not present

## 2022-01-13 DIAGNOSIS — M71071 Abscess of bursa, right ankle and foot: Secondary | ICD-10-CM | POA: Diagnosis not present

## 2022-02-19 DIAGNOSIS — L209 Atopic dermatitis, unspecified: Secondary | ICD-10-CM | POA: Diagnosis not present

## 2022-02-19 DIAGNOSIS — H1045 Other chronic allergic conjunctivitis: Secondary | ICD-10-CM | POA: Diagnosis not present

## 2022-02-19 DIAGNOSIS — M19021 Primary osteoarthritis, right elbow: Secondary | ICD-10-CM | POA: Diagnosis not present

## 2022-02-19 DIAGNOSIS — M19022 Primary osteoarthritis, left elbow: Secondary | ICD-10-CM | POA: Diagnosis not present

## 2022-02-19 DIAGNOSIS — M170 Bilateral primary osteoarthritis of knee: Secondary | ICD-10-CM | POA: Diagnosis not present

## 2022-02-19 DIAGNOSIS — J3089 Other allergic rhinitis: Secondary | ICD-10-CM | POA: Diagnosis not present

## 2022-03-11 DIAGNOSIS — M170 Bilateral primary osteoarthritis of knee: Secondary | ICD-10-CM | POA: Diagnosis not present

## 2022-03-12 DIAGNOSIS — L400 Psoriasis vulgaris: Secondary | ICD-10-CM | POA: Diagnosis not present

## 2022-03-18 DIAGNOSIS — L4050 Arthropathic psoriasis, unspecified: Secondary | ICD-10-CM | POA: Diagnosis not present

## 2022-03-18 DIAGNOSIS — I10 Essential (primary) hypertension: Secondary | ICD-10-CM | POA: Diagnosis not present

## 2022-03-18 DIAGNOSIS — M170 Bilateral primary osteoarthritis of knee: Secondary | ICD-10-CM | POA: Diagnosis not present

## 2022-04-02 DIAGNOSIS — J3089 Other allergic rhinitis: Secondary | ICD-10-CM | POA: Diagnosis not present

## 2022-04-23 DIAGNOSIS — M25562 Pain in left knee: Secondary | ICD-10-CM | POA: Diagnosis not present

## 2022-04-30 DIAGNOSIS — M170 Bilateral primary osteoarthritis of knee: Secondary | ICD-10-CM | POA: Diagnosis not present

## 2022-05-07 DIAGNOSIS — M170 Bilateral primary osteoarthritis of knee: Secondary | ICD-10-CM | POA: Diagnosis not present

## 2022-05-14 DIAGNOSIS — M170 Bilateral primary osteoarthritis of knee: Secondary | ICD-10-CM | POA: Diagnosis not present

## 2022-05-21 DIAGNOSIS — L4050 Arthropathic psoriasis, unspecified: Secondary | ICD-10-CM | POA: Diagnosis not present

## 2022-05-21 DIAGNOSIS — M170 Bilateral primary osteoarthritis of knee: Secondary | ICD-10-CM | POA: Diagnosis not present

## 2022-05-21 DIAGNOSIS — I10 Essential (primary) hypertension: Secondary | ICD-10-CM | POA: Diagnosis not present

## 2022-06-10 DIAGNOSIS — Z79899 Other long term (current) drug therapy: Secondary | ICD-10-CM | POA: Diagnosis not present

## 2022-06-10 DIAGNOSIS — L400 Psoriasis vulgaris: Secondary | ICD-10-CM | POA: Diagnosis not present

## 2022-06-10 DIAGNOSIS — L239 Allergic contact dermatitis, unspecified cause: Secondary | ICD-10-CM | POA: Diagnosis not present

## 2022-06-14 DIAGNOSIS — M170 Bilateral primary osteoarthritis of knee: Secondary | ICD-10-CM | POA: Diagnosis not present

## 2022-06-28 DIAGNOSIS — M1712 Unilateral primary osteoarthritis, left knee: Secondary | ICD-10-CM | POA: Diagnosis not present

## 2022-06-30 NOTE — H&P (Signed)
KNEE ARTHROPLASTY ADMISSION H&P  Patient ID: Sarah Walker MRN: [id number] DOB/AGE: June 12, 1950 72 y.o.  Chief Complaint: left knee pain.  Planned Procedure Date: 07/20/22 Medical and Cardiac Clearance by Dr. Assunta Found     HPI: Sarah Walker is a 72 y.o. female who presents for evaluation of OA LEFT KNEE. The patient has a history of pain and functional disability in the left knee due to arthritis and has failed non-surgical conservative treatments for greater than 12 weeks to include NSAID's and/or analgesics, corticosteriod injections, viscosupplementation injections, use of assistive devices, and activity modification.  Onset of symptoms was gradual, starting 1 years ago with gradually worsening course since that time. The patient noted no past surgery on the left knee.  Patient currently rates pain at 6 out of 10 with activity. Patient has night pain, worsening of pain with activity and weight bearing, and pain that interferes with activities of daily living.  Patient has evidence of subchondral sclerosis, periarticular osteophytes, and joint space narrowing by imaging studies.  There is no active infection.  Past Medical History:  Diagnosis Date   Abdominal hernia    Allergic rhinitis    Arthritis    Bone spur of other site    left foot   Dry eyes    Family history of kidney cancer    Family history of prostate cancer    Hypertension    Ovarian cancer (HCC)    Ovarian cancer on right (HCC) 06/17/2011   Peripheral neuropathy 08/26/2011   Chemotherapy-induced   Pulmonary embolism (HCC)    Sinusitis    Past Surgical History:  Procedure Laterality Date   ABDOMINAL HYSTERECTOMY  05/25/2011   [place] bs&o and cancer staging   CHOLECYSTECTOMY  1990   COLONOSCOPY N/A 09/06/2014   Procedure: COLONOSCOPY;  Surgeon: Malissa Hippo, MD;  Location: [facility];  Service: Endoscopy;  Laterality: N/A;  930 - moved to 9/2 @ 8:25   I & D EXTREMITY Left 12/13/2021   Procedure: IRRIGATION AND  DEBRIDEMENT ANKLE/ LEG;  Surgeon: Sheral Apley, MD;  Location: [facility] OR;  Service: Orthopedics;  Laterality: Left;   INCISIONAL HERNIA REPAIR  10/15/2014   INCISIONAL HERNIA REPAIR  10/15/2014   Procedure: OPEN INCISIONAL HERNIA REPAIR WITH MESH AND MYOFASCIAL FLAPS;  Surgeon: Harriette Bouillon, MD;  Location: [facility] OR;  Service: General;;   IVC filter  05/24/11   PORT-A-CATH REMOVAL Right 11/21/2013   Procedure: MINOR REMOVAL PORT-A-CATH;  Surgeon: Dalia Heading, MD;  Location: [facility] ORS;  Service: General;  Laterality: Right;   PORTACATH PLACEMENT  06/16/11   Done at [facility]   TONSILLECTOMY     age 45's   TOTAL HIP ARTHROPLASTY Right 08/26/2020   Procedure: TOTAL HIP ARTHROPLASTY ANTERIOR APPROACH;  Surgeon: Sheral Apley, MD;  Location: [facility] ORS;  Service: Orthopedics;  Laterality: Right;   TUBAL LIGATION  1981   Allergies  Allergen Reactions   Codeine Nausea And Vomiting    Not right away after a couple days of taking   Erythromycin Itching and Other (See Comments)   Prior to Admission medications   Medication Sig Start Date End Date Taking? Authorizing Provider  acetaminophen (TYLENOL) 500 MG tablet Take 1,000 mg by mouth daily.    [provider]  Ascorbic Acid (VITAMIN C PO) Take 4 tablets by mouth daily. Chewable    [provider]  aspirin EC 81 MG tablet Take 81 mg by mouth daily. Swallow whole.    [provider]  azelastine (OPTIVAR) 0.05 % ophthalmic solution Place 1 drop into both eyes daily as needed (rag weed). 07/02/14   [provider]  bismuth subsalicylate (PEPTO BISMOL) 262 MG/15ML suspension Take 30 mLs by mouth every 6 (six) hours as needed for indigestion.    [provider]  bisoprolol-hydrochlorothiazide (ZIAC) 5-6.25 MG per tablet Take 1 tablet by mouth daily.    [provider]  Calcium-Magnesium-Vitamin D (CALCIUM 500 PO) Take 1,000 mg by mouth daily. Chewable    [provider]  cephALEXin  (KEFLEX) 500 MG capsule Take 1 capsule (500 mg total) by mouth every 6 (six) hours. 12/14/21   Armida Sans, PA-C  Cholecalciferol (VITAMIN D3 PO) Take 1 capsule by mouth daily.    [provider]  clotrimazole-betamethasone (LOTRISONE) cream SMARTSIG:Topical Morning-Evening 09/22/21   [provider]  Cyanocobalamin (VITAMIN B-12) 2500 MCG SUBL Place 2,500 mcg under the tongue daily.    [provider]  Desoximetasone 0.05 % OINT SMARTSIG:Liberally Topical Daily 11/03/21   [provider]  DHA-EPA-Vitamin E (OMEGA-3 COMPLEX PO) Take 4 capsules by mouth daily. Omega xl    [provider]  dicyclomine (BENTYL) 10 MG capsule Take 10 mg by mouth 4 (four) times daily as needed. 10/28/21   [provider]  DUPIXENT 300 MG/2ML SOPN Inject into the skin. 11/25/21   [provider]  fexofenadine (ALLEGRA) 180 MG tablet Take 180 mg by mouth daily as needed for allergies or rhinitis.    [provider]  Fluocinolone Acetonide Scalp 0.01 % OIL 2 (two) times daily. as directed 10/28/21   [provider]  fluticasone (FLONASE) 50 MCG/ACT nasal spray Place 1 spray into both nostrils daily as needed for allergies or rhinitis.    [provider]  furosemide (LASIX) 40 MG tablet Take 40 mg by mouth daily as needed. 12/07/21   [provider]  KLOR-CON M20 20 MEQ tablet TAKE 1 TABLET BY MOUTH EVERY DAY Patient taking differently: Take 10 mEq by mouth daily. 11/30/16   Hubbard Hartshorn, NP  meloxicam (MOBIC) 15 MG tablet See admin instructions. PLEASE SEE ATTACHED FOR DETAILED DIRECTIONS 09/13/21   [provider]  Menaquinone-7 (VITAMIN K2 PO) Take 1 capsule by mouth daily. MK7    [provider]  mometasone (ELOCON) 0.1 % ointment Apply topically. 11/03/21   [provider]  mupirocin ointment (BACTROBAN) 2 % SMARTSIG:Sparingly Topical 3 Times Daily 12/07/21   [provider]   oxyCODONE (ROXICODONE) 5 MG immediate release tablet Take 1 tablet (5 mg total) by mouth every 4 (four) hours as needed for severe pain. 12/14/21   Armida Sans, PA-C  tacrolimus (PROTOPIC) 0.1 % ointment Apply  as directed to affected area twice a day 11/24/21   [provider]  valsartan (DIOVAN) 160 MG tablet Take 160 mg by mouth daily. 10/14/17   [provider]  zinc gluconate 50 MG tablet Take 50 mg by mouth daily.    [provider]  Zinc Oxide 40 % PSTE Apply 1 application topically daily as needed. 11/23/18   Jinny Sanders, FNP   Social History   Socioeconomic History   Marital status: Married    Spouse name: Not on file   Number of children: Not on file   Years of education: Not on file   Highest education level: Not on file  Occupational History   Not on file  Tobacco Use   Smoking status: Never  Smokeless tobacco: Never  Vaping Use   Vaping Use: Never used  Substance and Sexual Activity   Alcohol use: No   Drug use: No   Sexual activity: Yes    Birth control/protection: Surgical    Comment: hyst  Other Topics Concern   Not on file  Social History Narrative   Not on file   Social Determinants of Health   Financial Resource Strain: Not on file  Food Insecurity: No Food Insecurity (12/13/2021)   Hunger Vital Sign    Worried About Running Out of Food in the Last Year: Never true    Ran Out of Food in the Last Year: Never true  Transportation Needs: No Transportation Needs (12/13/2021)   PRAPARE - Administrator, Civil Service (Medical): No    Lack of Transportation (Non-Medical): No  Physical Activity: Not on file  Stress: Not on file  Social Connections: Not on file   Family History  Problem Relation Age of Onset   Stroke Mother    Cervical cancer Mother 18   Kidney cancer Father 88   Aneurysm Paternal Grandmother        77s   Hodgkin's lymphoma Maternal Uncle 33   Prostate cancer Paternal Uncle    Cancer  Cousin        2 female maternal first cousins with unknown cancer   Throat cancer Paternal Uncle     ROS: Currently denies lightheadedness, dizziness, Fever, chills, CP, SOB.   No personal history of DVT, MI, or CVA. + h/o PE in 2013 when undergoing chemotherapy No loose teeth or dentures All other systems have been reviewed and were otherwise currently negative with the exception of those mentioned in the HPI and as above.  Objective: Vitals: Ht: 5'0" Wt: 203 lbs Temp: 98.5 BP: 129/78 Pulse: 91 O2 92% on room air.   Physical Exam: General: Alert, NAD.  Antalgic Gait  HEENT: EOMI, Good Neck Extension  Pulm: No increased work of breathing.  Clear B/L A/P w/o crackle or wheeze.  CV: RRR, No m/g/r appreciated  GI: soft, NT, ND. BS x 4 quadrants Neuro: CN II-XII grossly intact without focal deficit.  Sensation intact distally Skin: No lesions in the area of chief complaint MSK/Surgical Site:  + JLT. ROM slow from 5-90 degrees. Decreased strength in extension and flexion.  +EHL/FHL.  NVI.  Stable varus and valgus stress.    Imaging Review Plain radiographs demonstrate severe degenerative joint disease of the left knee.   The overall alignment ismild varus. The bone quality appears to be fair for age and reported activity level.  Preoperative templating of the joint replacement has been completed, documented, and submitted to the Operating Room personnel in order to optimize intra-operative equipment management.  Assessment: OA LEFT KNEE Active Problems:   * No active hospital problems. *   Plan: Plan for Procedure(s): TOTAL KNEE ARTHROPLASTY  The patient history, physical exam, clinical judgement of the provider and imaging are consistent with end stage degenerative joint disease and total joint arthroplasty is deemed medically necessary. The treatment options including medical management, injection therapy, and arthroplasty were discussed at length. The risks and benefits of  Procedure(s): TOTAL KNEE ARTHROPLASTY were presented and reviewed.  The risks of nonoperative treatment, versus surgical intervention including but not limited to continued pain, aseptic loosening, stiffness, dislocation/subluxation, infection, bleeding, nerve injury, blood clots, cardiopulmonary complications, morbidity, mortality, among others were discussed. The patient verbalizes understanding and wishes to proceed with the plan.  Patient  is being admitted for inpatient treatment for surgery, pain control, PT, prophylactic antibiotics, VTE prophylaxis, progressive ambulation, ADL's and discharge planning. She will spend the night in observation.  Dental prophylaxis discussed and recommended for 2 years postoperatively.  The patient does meet the criteria for TXA which will be used perioperatively.   Xarelto 10mg  will be used postoperatively for DVT prophylaxis in addition to SCDs, and early ambulation due to her h/o PE in 2013. Plan for Tylenol and Oxycodone for pain.  No NSAIDs since will be on Xarelto.  Robaxin for muscle spasms.   Zofran for nausea and vomiting. Pharmacy- [facility] The patient is planning to be discharged home with OPPT and into the care of her husband Marita [place] who can be reached at [phone] Follow up appt 08/04/22 at 4:15pm     Marzetta Board Office [phone] 06/30/2022 6:04 PM

## 2022-07-07 NOTE — Care Plan (Signed)
Ortho Bundle Case Management Note  Patient Details  Name: Sarah Walker MRN: [id number] Date of Birth: 09-25-1950    seen in the office for H&P, will discharge to home with family to assist. has rolling walker, CPM ordered. OPPT set up with [facility] discharge instructions discussed and questions answered. appointments confirmed . Patient and MD in agreement with plan. Choice offered             DME Arranged:  [facility] DME Agency:  [facility]  HH Arranged:    HH Agency:     Additional Comments: Please contact me with any questions of if this plan should need to change.  Shauna Hugh,  RN,BSN,MHA,CCM  [facility]  [phone] 07/07/2022, 12:18 PM

## 2022-07-13 NOTE — Patient Instructions (Signed)
DUE TO COVID-19 ONLY TWO VISITORS  (aged 73 and older)  ARE ALLOWED TO COME WITH YOU AND STAY IN THE WAITING ROOM ONLY DURING PRE OP AND PROCEDURE.   **NO VISITORS ARE ALLOWED IN THE SHORT STAY AREA OR RECOVERY ROOM!!**  IF YOU WILL BE ADMITTED INTO THE HOSPITAL YOU ARE ALLOWED ONLY FOUR SUPPORT PEOPLE DURING VISITATION HOURS ONLY (7 AM -8PM)   The support person(s) must pass our screening, gel in and out, and wear a mask at all times, including in the patient's room. Patients must also wear a mask when staff or their support person are in the room. Visitors GUEST BADGE MUST BE WORN VISIBLY  One adult visitor may remain with you overnight and MUST be in the room by 8 P.M.     Your procedure is scheduled on: 07/20/22   Report to [facility] Main Entrance    Report to admitting at : 9:00 AM   Call this number if you have problems the morning of surgery [phone]   Do not eat food :After Midnight.   After Midnight you may have the following liquids until : 8:30 AM DAY OF SURGERY  Water Black Coffee (sugar ok, NO MILK/CREAM OR CREAMERS)  Tea (sugar ok, NO MILK/CREAM OR CREAMERS) regular and decaf                             Plain Jell-O (NO RED)                                           Fruit ices (not with fruit pulp, NO RED)                                     Popsicles (NO RED)                                                                  Juice: apple, WHITE grape, WHITE cranberry Sports drinks like Gatorade (NO RED)   The day of surgery:  Drink ONE (1) Pre-Surgery Clear Ensure at : 8:30 AM the morning of surgery. Drink in one sitting. Do not sip.  This drink was given to you during your hospital  pre-op appointment visit. Nothing else to drink after completing the  Pre-Surgery Clear Ensure or G2.          If you have questions, please contact your surgeon's office.   Oral Hygiene is also important to reduce your risk of infection.                                     Remember - BRUSH YOUR TEETH THE MORNING OF SURGERY WITH YOUR REGULAR TOOTHPASTE  DENTURES WILL BE REMOVED PRIOR TO SURGERY PLEASE DO NOT APPLY "Poly grip" OR ADHESIVES!!!   Do NOT smoke after Midnight   Take these medicines the morning of surgery with A SIP OF WATER: fexofenadine.  You may not have any metal on your body including hair pins, jewelry, and body piercing             Do not wear make-up, lotions, powders, perfumes/cologne, or deodorant  Do not wear nail polish including gel and S&S, artificial/acrylic nails, or any other type of covering on natural nails including finger and toenails. If you have artificial nails, gel coating, etc. that needs to be removed by a nail salon please have this removed prior to surgery or surgery may need to be canceled/ delayed if the surgeon/ anesthesia feels like they are unable to be safely monitored.   Do not shave  48 hours prior to surgery.   Do not bring valuables to the hospital. [facility] IS NOT             RESPONSIBLE   FOR VALUABLES.   Contacts, glasses, or bridgework may not be worn into surgery.   Bring small overnight bag day of surgery.   DO NOT BRING YOUR HOME MEDICATIONS TO THE HOSPITAL. PHARMACY WILL DISPENSE MEDICATIONS LISTED ON YOUR MEDICATION LIST TO YOU DURING YOUR ADMISSION IN THE HOSPITAL!    Patients discharged on the day of surgery will not be allowed to drive home.  Someone NEEDS to stay with you for the first 24 hours after anesthesia.   Special Instructions: Bring a copy of your healthcare power of attorney and living will documents         the day of surgery if you haven't scanned them before.              Please read over the following fact sheets you were given: IF YOU HAVE QUESTIONS ABOUT YOUR PRE-OP INSTRUCTIONS PLEASE CALL [phone]      Pre-operative 5 CHG Bath Instructions   You can play a key role in reducing the risk of infection after surgery. Your skin needs to be as free  of germs as possible. You can reduce the number of germs on your skin by washing with CHG (chlorhexidine gluconate) soap before surgery. CHG is an antiseptic soap that kills germs and continues to kill germs even after washing.   DO NOT use if you have an allergy to chlorhexidine/CHG or antibacterial soaps. If your skin becomes reddened or irritated, stop using the CHG and notify one of our RNs at : [phone].   Please shower with the CHG soap starting 4 days before surgery using the following schedule:     Please keep in mind the following:  DO NOT shave, including legs and underarms, starting the day of your first shower.   You may shave your face at any point before/day of surgery.  Place clean sheets on your bed the day you start using CHG soap. Use a clean washcloth (not used since being washed) for each shower. DO NOT sleep with pets once you start using the CHG.   CHG Shower Instructions:  If you choose to wash your hair and private area, wash first with your normal shampoo/soap.  After you use shampoo/soap, rinse your hair and body thoroughly to remove shampoo/soap residue.  Turn the water OFF and apply about 3 tablespoons (45 ml) of CHG soap to a CLEAN washcloth.  Apply CHG soap ONLY FROM YOUR NECK DOWN TO YOUR TOES (washing for 3-5 minutes)  DO NOT use CHG soap on face, private areas, open wounds, or sores.  Pay special attention to the area where your surgery is being performed.  If  you are having back surgery, having someone wash your back for you may be helpful. Wait 2 minutes after CHG soap is applied, then you may rinse off the CHG soap.  Pat dry with a clean towel  Put on clean clothes/pajamas   If you choose to wear lotion, please use ONLY the CHG-compatible lotions on the back of this paper.     Additional instructions for the day of surgery: DO NOT APPLY any lotions, deodorants, cologne, or perfumes.   Put on clean/comfortable clothes.  Brush your teeth.  Ask  your nurse before applying any prescription medications to the skin.      CHG Compatible Lotions   Aveeno Moisturizing lotion  Cetaphil Moisturizing Cream  Cetaphil Moisturizing Lotion  Clairol Herbal Essence Moisturizing Lotion, Dry Skin  Clairol Herbal Essence Moisturizing Lotion, Extra Dry Skin  Clairol Herbal Essence Moisturizing Lotion, Normal Skin  Curel Age Defying Therapeutic Moisturizing Lotion with Alpha Hydroxy  Curel Extreme Care Body Lotion  Curel Soothing Hands Moisturizing Hand Lotion  Curel Therapeutic Moisturizing Cream, Fragrance-Free  Curel Therapeutic Moisturizing Lotion, Fragrance-Free  Curel Therapeutic Moisturizing Lotion, Original Formula  Eucerin Daily Replenishing Lotion  Eucerin Dry Skin Therapy Plus Alpha Hydroxy Crme  Eucerin Dry Skin Therapy Plus Alpha Hydroxy Lotion  Eucerin Original Crme  Eucerin Original Lotion  Eucerin Plus Crme Eucerin Plus Lotion  Eucerin TriLipid Replenishing Lotion  Keri Anti-Bacterial Hand Lotion  Keri Deep Conditioning Original Lotion Dry Skin Formula Softly Scented  Keri Deep Conditioning Original Lotion, Fragrance Free Sensitive Skin Formula  Keri Lotion Fast Absorbing Fragrance Free Sensitive Skin Formula  Keri Lotion Fast Absorbing Softly Scented Dry Skin Formula  Keri Original Lotion  Keri Skin Renewal Lotion Keri Silky Smooth Lotion  Keri Silky Smooth Sensitive Skin Lotion  Nivea Body Creamy Conditioning Oil  Nivea Body Extra Enriched Lotion  Nivea Body Original Lotion  Nivea Body Sheer Moisturizing Lotion Nivea Crme  Nivea Skin Firming Lotion  NutraDerm 30 Skin Lotion  NutraDerm Skin Lotion  NutraDerm Therapeutic Skin Cream  NutraDerm Therapeutic Skin Lotion  ProShield Protective Hand Cream  Provon moisturizing lotion   Incentive Spirometer  An incentive spirometer is a tool that can help keep your lungs clear and active. This tool measures how well you are filling your lungs with each breath. Taking  long deep breaths may help reverse or decrease the chance of developing breathing (pulmonary) problems (especially infection) following: A long period of time when you are unable to move or be active. BEFORE THE PROCEDURE  If the spirometer includes an indicator to show your best effort, your nurse or respiratory therapist will set it to a desired goal. If possible, sit up straight or lean slightly forward. Try not to slouch. Hold the incentive spirometer in an upright position. INSTRUCTIONS FOR USE  Sit on the edge of your bed if possible, or sit up as far as you can in bed or on a chair. Hold the incentive spirometer in an upright position. Breathe out normally. Place the mouthpiece in your mouth and seal your lips tightly around it. Breathe in slowly and as deeply as possible, raising the piston or the ball toward the top of the column. Hold your breath for 3-5 seconds or for as long as possible. Allow the piston or ball to fall to the bottom of the column. Remove the mouthpiece from your mouth and breathe out normally. Rest for a few seconds and repeat Steps 1 through 7 at least 10 times every 1-2  hours when you are awake. Take your time and take a few normal breaths between deep breaths. The spirometer may include an indicator to show your best effort. Use the indicator as a goal to work toward during each repetition. After each set of 10 deep breaths, practice coughing to be sure your lungs are clear. If you have an incision (the cut made at the time of surgery), support your incision when coughing by placing a pillow or rolled up towels firmly against it. Once you are able to get out of bed, walk around indoors and cough well. You may stop using the incentive spirometer when instructed by your caregiver.  RISKS AND COMPLICATIONS Take your time so you do not get dizzy or light-headed. If you are in pain, you may need to take or ask for pain medication before doing incentive spirometry. It  is harder to take a deep breath if you are having pain. AFTER USE Rest and breathe slowly and easily. It can be helpful to keep track of a log of your progress. Your caregiver can provide you with a simple table to help with this. If you are using the spirometer at home, follow these instructions: SEEK MEDICAL CARE IF:  You are having difficultly using the spirometer. You have trouble using the spirometer as often as instructed. Your pain medication is not giving enough relief while using the spirometer. You develop fever of 100.5 F (38.1 C) or higher. SEEK IMMEDIATE MEDICAL CARE IF:  You cough up bloody sputum that had not been present before. You develop fever of 102 F (38.9 C) or greater. You develop worsening pain at or near the incision site. MAKE SURE YOU:  Understand these instructions. Will watch your condition. Will get help right away if you are not doing well or get worse. Document Released: 05/03/2006 Document Revised: 03/15/2011 Document Reviewed: 07/04/2006 [facility] Patient Information 2014 [place], [place].   ________________________________________________________________________

## 2022-07-14 ENCOUNTER — Other Ambulatory Visit: Payer: Self-pay

## 2022-07-14 ENCOUNTER — Encounter (HOSPITAL_COMMUNITY): Payer: Self-pay

## 2022-07-14 ENCOUNTER — Encounter (HOSPITAL_COMMUNITY)
Admission: RE | Admit: 2022-07-14 | Discharge: 2022-07-14 | Disposition: A | Discharge status: Home / Self Care | Payer: Medicare Other | Source: Ambulatory Visit | Attending: Orthopedic Surgery | Admitting: Orthopedic Surgery

## 2022-07-14 VITALS — BP 117/77 | HR 82 | Temp 98.0°F | Ht 60.0 in | Wt 198.0 lb

## 2022-07-14 DIAGNOSIS — Z01818 Encounter for other preprocedural examination: Secondary | ICD-10-CM | POA: Diagnosis not present

## 2022-07-14 DIAGNOSIS — I10 Essential (primary) hypertension: Secondary | ICD-10-CM | POA: Diagnosis not present

## 2022-07-14 LAB — SURGICAL PCR SCREEN
MRSA, PCR: NEGATIVE
Staphylococcus aureus: NEGATIVE

## 2022-07-14 LAB — CBC
HCT: 40.5 % (ref 36.0–46.0)
Hemoglobin: 12.5 g/dL (ref 12.0–15.0)
MCH: 30.2 pg (ref 26.0–34.0)
MCHC: 30.9 g/dL (ref 30.0–36.0)
MCV: 97.8 fL (ref 80.0–100.0)
Platelets: 228 10*3/uL (ref 150–400)
RBC: 4.14 MIL/uL (ref 3.87–5.11)
RDW: 15.1 % (ref 11.5–15.5)
WBC: 7.7 10*3/uL (ref 4.0–10.5)
nRBC: 0 % (ref 0.0–0.2)

## 2022-07-14 LAB — BASIC METABOLIC PANEL
Anion gap: 10 (ref 5–15)
BUN: 27 mg/dL — ABNORMAL HIGH (ref 8–23)
CO2: 23 mmol/L (ref 22–32)
Calcium: 9.2 mg/dL (ref 8.9–10.3)
Chloride: 104 mmol/L (ref 98–111)
Creatinine, Ser: 1.16 mg/dL — ABNORMAL HIGH (ref 0.44–1.00)
GFR, Estimated: 50 mL/min — ABNORMAL LOW (ref >60)
Glucose, Bld: 94 mg/dL (ref 70–99)
Potassium: 4 mmol/L (ref 3.5–5.1)
Sodium: 137 mmol/L (ref 135–145)

## 2022-07-14 NOTE — Progress Notes (Signed)
For Short Stay: COVID SWAB appointment date:  Bowel Prep reminder:   For Anesthesia: PCP - Dr. Murlean Hark. Clearance: Terie Purser: 06/17/22 Cardiologist - N/A  Chest x-ray -  EKG - 12/13/21 Stress Test -  ECHO -  Cardiac Cath -  Pacemaker/ICD device last checked: Pacemaker orders received: Device Rep notified:  Spinal Cord Stimulator: N/A  Sleep Study - N/A CPAP -   Fasting Blood Sugar - N/A Checks Blood Sugar _____ times a day Date and result of last Hgb A1c-  Last dose of GLP1 agonist- N/A GLP1 instructions:   Last dose of SGLT-2 inhibitors- N/A SGLT-2 instructions:   Blood Thinner Instructions: Aspirin Instructions: 5 days before surgery. Last Dose:  Activity level: Can go up a flight of stairs and activities of daily living without stopping and without chest pain and/or shortness of breath   Able to exercise without chest pain and/or shortness of breath  Anesthesia review: Hx: HTN,PE  Patient denies shortness of breath, fever, cough and chest pain at PAT appointment   Patient verbalized understanding of instructions that were given to them at the PAT appointment. Patient was also instructed that they will need to review over the PAT instructions again at home before surgery.

## 2022-07-15 DIAGNOSIS — L2089 Other atopic dermatitis: Secondary | ICD-10-CM | POA: Diagnosis not present

## 2022-07-15 DIAGNOSIS — Z79899 Other long term (current) drug therapy: Secondary | ICD-10-CM | POA: Diagnosis not present

## 2022-07-19 NOTE — Anesthesia Preprocedure Evaluation (Signed)
Anesthesia Evaluation  Patient identified by MRN, date of birth, ID band Patient awake    Reviewed: Allergy & Precautions, Patient's Chart, lab work & pertinent test results  Airway Mallampati: II  TM Distance: >3 FB Neck ROM: Full    Dental no notable dental hx.    Pulmonary PE   Pulmonary exam normal        Cardiovascular hypertension (115/66 preop), Pt. on medications  Rhythm:Regular Rate:Normal     Neuro/Psych negative neurological ROS  negative psych ROS   GI/Hepatic negative GI ROS, Neg liver ROS,,,  Endo/Other  Obesity BMI 39  Renal/GU Renal InsufficiencyRenal diseaseCr 1.16  negative genitourinary   Musculoskeletal  (+) Arthritis , Osteoarthritis,    Abdominal Normal abdominal exam  (+)   Peds  Hematology negative hematology ROS (+) Hb 12.5, plt 228   Anesthesia Other Findings   Reproductive/Obstetrics negative OB ROS                             Anesthesia Physical Anesthesia Plan  ASA: 3  Anesthesia Plan: Spinal, Regional and MAC   Post-op Pain Management: Regional block* and Tylenol PO (pre-op)*   Induction:   PONV Risk Score and Plan: 2 and Propofol infusion and TIVA  Airway Management Planned: Natural Airway and Nasal Cannula  Additional Equipment: None  Intra-op Plan:   Post-operative Plan:   Informed Consent: I have reviewed the patients History and Physical, chart, labs and discussed the procedure including the risks, benefits and alternatives for the proposed anesthesia with the patient or authorized representative who has indicated his/her understanding and acceptance.     Dental advisory given  Plan Discussed with: CRNA  Anesthesia Plan Comments:        Anesthesia Quick Evaluation

## 2022-07-19 NOTE — Discharge Instructions (Signed)
You may bear weight as tolerated. Keep your dressing on and dry until follow up. Take medicine to prevent blood clots as directed. Take pain medicine as needed with the goal of transitioning to over the counter medicines.    INSTRUCTIONS AFTER JOINT REPLACEMENT   Remove items at home which could result in a fall. This includes throw rugs or furniture in walking pathways ICE to the affected joint every three hours while awake for 30 minutes at a time, for at least the first 3-5 days, and then as needed for pain and swelling.  Continue to use ice for pain and swelling. You may notice swelling that will progress down to the foot and ankle.  This is normal after surgery.  Elevate your leg when you are not up walking on it.   Continue to use the breathing machine you got in the hospital (incentive spirometer) which will help keep your temperature down.  It is common for your temperature to cycle up and down following surgery, especially at night when you are not up moving around and exerting yourself.  The breathing machine keeps your lungs expanded and your temperature down.   DIET:  As you were doing prior to hospitalization, we recommend a well-balanced diet.  DRESSING / WOUND CARE / SHOWERING  You may shower 3 days after surgery, but keep the wounds dry during showering.  You may use an occlusive plastic wrap (Press'n Seal for example) with blue painter's tape at edges, NO SOAKING/SUBMERGING IN THE BATHTUB.  If the bandage gets wet, call the office.   ACTIVITY  Increase activity slowly as tolerated, but follow the weight bearing instructions below.   No driving for 6 weeks or until further direction given by your physician.  You cannot drive while taking narcotics.  No lifting or carrying greater than 10 lbs. until further directed by your surgeon. Avoid periods of inactivity such as sitting longer than an hour when not asleep. This helps prevent blood clots.  You may return to work once you  are authorized by your doctor.    WEIGHT BEARING   Weight bearing as tolerated with assist device (walker, cane, etc) as directed, use it as long as suggested by your surgeon or therapist, typically at least 4-6 weeks.   EXERCISES  Results after joint replacement surgery are often greatly improved when you follow the exercise, range of motion and muscle strengthening exercises prescribed by your doctor. Safety measures are also important to protect the joint from further injury. Any time any of these exercises cause you to have increased pain or swelling, decrease what you are doing until you are comfortable again and then slowly increase them. If you have problems or questions, call your caregiver or physical therapist for advice.   Rehabilitation is important following a joint replacement. After just a few days of immobilization, the muscles of the leg can become weakened and shrink (atrophy).  These exercises are designed to build up the tone and strength of the thigh and leg muscles and to improve motion. Often times heat used for twenty to thirty minutes before working out will loosen up your tissues and help with improving the range of motion but do not use heat for the first two weeks following surgery (sometimes heat can increase post-operative swelling).   These exercises can be done on a training (exercise) mat, on the floor, on a table or on a bed. Use whatever works the best and is most comfortable for you.  Use music or television while you are exercising so that the exercises are a pleasant break in your day. This will make your life better with the exercises acting as a break in your routine that you can look forward to.   Perform all exercises about fifteen times, three times per day or as directed.  You should exercise both the operative leg and the other leg as well.  Exercises include:   Quad Sets - Tighten up the muscle on the front of the thigh (Quad) and hold for 5-10  seconds.   Straight Leg Raises - With your knee straight (if you were given a brace, keep it on), lift the leg to 60 degrees, hold for 3 seconds, and slowly lower the leg.  Perform this exercise against resistance later as your leg gets stronger.  Leg Slides: Lying on your back, slowly slide your foot toward your buttocks, bending your knee up off the floor (only go as far as is comfortable). Then slowly slide your foot back down until your leg is flat on the floor again.  Angel Wings: Lying on your back spread your legs to the side as far apart as you can without causing discomfort.  Hamstring Strength:  Lying on your back, push your heel against the floor with your leg straight by tightening up the muscles of your buttocks.  Repeat, but this time bend your knee to a comfortable angle, and push your heel against the floor.  You may put a pillow under the heel to make it more comfortable if necessary.   A rehabilitation program following joint replacement surgery can speed recovery and prevent re-injury in the future due to weakened muscles. Contact your doctor or a physical therapist for more information on knee rehabilitation.    CONSTIPATION  Constipation is defined medically as fewer than three stools per week and severe constipation as less than one stool per week.  Even if you have a regular bowel pattern at home, your normal regimen is likely to be disrupted due to multiple reasons following surgery.  Combination of anesthesia, postoperative narcotics, change in appetite and fluid intake all can affect your bowels.   YOU MUST use at least one of the following options; they are listed in order of increasing strength to get the job done.  They are all available over the counter, and you may need to use some, POSSIBLY even all of these options:    Drink plenty of fluids (prune juice may be helpful) and high fiber foods Colace 100 mg by mouth twice a day  Senokot for constipation as directed and  as needed Dulcolax (bisacodyl), take with full glass of water  Miralax (polyethylene glycol) once or twice a day as needed.  If you have tried all these things and are unable to have a bowel movement in the first 3-4 days after surgery call either your surgeon or your primary doctor.    If you experience loose stools or diarrhea, hold the medications until you stool forms back up.  If your symptoms do not get better within 1 week or if they get worse, check with your doctor.  If you experience "the worst abdominal pain ever" or develop nausea or vomiting, please contact the office immediately for further recommendations for treatment.   ITCHING:  If you experience itching with your medications, try taking only a single pain pill, or even half a pain pill at a time.  You can also use Benadryl over the counter  for itching or also to help with sleep.   TED HOSE STOCKINGS:  Use stockings on both legs until for at least 2 weeks or as directed by physician office. They may be removed at night for sleeping.  MEDICATIONS:  See your medication summary on the "After Visit Summary" that nursing will review with you.  You may have some home medications which will be placed on hold until you complete the course of blood thinner medication.  It is important for you to complete the blood thinner medication as prescribed.  Take medicines as prescribed.   You have several different medicines that work in different ways. - Tylenol is for mild to moderate pain. Try to take this medicine before turning to your narcotic medicines.  - Robaxin is for muscle spasms. This medicine can make you drowsy. - Oxycodone is a narcotic pain medicine.  Take this for severe pain. This medicine can be dehydrating / constipating. - Zofran is for nausea and vomiting. - Xarelto is to prevent blood clots after surgery.   PRECAUTIONS:  If you experience chest pain or shortness of breath - call 911 immediately for transfer to the  hospital emergency department.   If you develop a fever greater that 101 F, purulent drainage from wound, increased redness or drainage from wound, foul odor from the wound/dressing, or calf pain - CONTACT YOUR SURGEON.                                                   FOLLOW-UP APPOINTMENTS:  If you do not already have a post-op appointment, please call the office [phone] for an appointment to be seen by Dr. Eulah Pont in 2 weeks.   OTHER INSTRUCTIONS:   MAKE SURE YOU:  Understand these instructions.  Get help right away if you are not doing well or get worse.    Thank you for letting us be a part of your medical care team.  It is a privilege we respect greatly.  We hope these instructions will help you stay on track for a fast and full recovery!

## 2022-07-20 ENCOUNTER — Encounter (HOSPITAL_COMMUNITY): Payer: Self-pay | Admitting: Orthopedic Surgery

## 2022-07-20 ENCOUNTER — Observation Stay (HOSPITAL_COMMUNITY)
Admission: RE | Admit: 2022-07-20 | Discharge: 2022-07-22 | Disposition: A | Discharge status: Home / Self Care | Payer: Medicare Other | Attending: Orthopedic Surgery | Admitting: Orthopedic Surgery

## 2022-07-20 ENCOUNTER — Ambulatory Visit (HOSPITAL_COMMUNITY): Payer: Medicare Other

## 2022-07-20 ENCOUNTER — Ambulatory Visit (HOSPITAL_COMMUNITY): Payer: Medicare Other | Admitting: Anesthesiology

## 2022-07-20 ENCOUNTER — Other Ambulatory Visit: Payer: Self-pay

## 2022-07-20 ENCOUNTER — Ambulatory Visit (HOSPITAL_BASED_OUTPATIENT_CLINIC_OR_DEPARTMENT_OTHER): Payer: Medicare Other | Admitting: Anesthesiology

## 2022-07-20 ENCOUNTER — Encounter (HOSPITAL_COMMUNITY)
Admission: RE | Disposition: A | Discharge status: Home / Self Care | Payer: Self-pay | Source: Home / Self Care | Attending: Orthopedic Surgery

## 2022-07-20 DIAGNOSIS — Z471 Aftercare following joint replacement surgery: Secondary | ICD-10-CM | POA: Diagnosis not present

## 2022-07-20 DIAGNOSIS — Z6839 Body mass index (BMI) 39.0-39.9, adult: Secondary | ICD-10-CM | POA: Diagnosis not present

## 2022-07-20 DIAGNOSIS — Z79899 Other long term (current) drug therapy: Secondary | ICD-10-CM | POA: Diagnosis not present

## 2022-07-20 DIAGNOSIS — M1712 Unilateral primary osteoarthritis, left knee: Secondary | ICD-10-CM

## 2022-07-20 DIAGNOSIS — Z01818 Encounter for other preprocedural examination: Secondary | ICD-10-CM

## 2022-07-20 DIAGNOSIS — G8918 Other acute postprocedural pain: Secondary | ICD-10-CM | POA: Diagnosis not present

## 2022-07-20 DIAGNOSIS — Z7982 Long term (current) use of aspirin: Secondary | ICD-10-CM | POA: Insufficient documentation

## 2022-07-20 DIAGNOSIS — Z96641 Presence of right artificial hip joint: Secondary | ICD-10-CM | POA: Insufficient documentation

## 2022-07-20 DIAGNOSIS — I10 Essential (primary) hypertension: Secondary | ICD-10-CM | POA: Diagnosis not present

## 2022-07-20 DIAGNOSIS — Z8543 Personal history of malignant neoplasm of ovary: Secondary | ICD-10-CM | POA: Diagnosis not present

## 2022-07-20 DIAGNOSIS — Z86711 Personal history of pulmonary embolism: Secondary | ICD-10-CM | POA: Insufficient documentation

## 2022-07-20 DIAGNOSIS — Z96652 Presence of left artificial knee joint: Secondary | ICD-10-CM | POA: Diagnosis not present

## 2022-07-20 DIAGNOSIS — E669 Obesity, unspecified: Secondary | ICD-10-CM | POA: Diagnosis not present

## 2022-07-20 DIAGNOSIS — Z96659 Presence of unspecified artificial knee joint: Secondary | ICD-10-CM

## 2022-07-20 HISTORY — PX: TOTAL KNEE ARTHROPLASTY: SHX125

## 2022-07-20 SURGERY — ARTHROPLASTY, KNEE, TOTAL
Anesthesia: Monitor Anesthesia Care | Site: Knee | Laterality: Left

## 2022-07-20 MED ORDER — DIPHENHYDRAMINE HCL 12.5 MG/5ML PO ELIX
12.5000 mg | ORAL_SOLUTION | ORAL | Status: DC | PRN
  Administered 2022-07-20 – 2022-07-21 (×2): 25 mg via ORAL
  Filled 2022-07-20 (×2): qty 10

## 2022-07-20 MED ORDER — VALSARTAN-HYDROCHLOROTHIAZIDE 320-12.5 MG PO TABS: 1.0000 | ORAL_TABLET | Freq: Every day | ORAL | Status: DC

## 2022-07-20 MED ORDER — DEXAMETHASONE SODIUM PHOSPHATE 10 MG/ML IJ SOLN
INTRAMUSCULAR | Status: AC
  Filled 2022-07-20: qty 1

## 2022-07-20 MED ORDER — METOCLOPRAMIDE HCL 5 MG PO TABS: 5.0000 mg | ORAL_TABLET | Freq: Three times a day (TID) | ORAL | Status: DC | PRN

## 2022-07-20 MED ORDER — PHENYLEPHRINE HCL-NACL 20-0.9 MG/250ML-% IV SOLN
INTRAVENOUS | Status: AC
  Filled 2022-07-20: qty 250

## 2022-07-20 MED ORDER — BUPIVACAINE-EPINEPHRINE 0.25% -1:200000 IJ SOLN
INTRAMUSCULAR | Status: DC | PRN
  Administered 2022-07-20: 30 mL

## 2022-07-20 MED ORDER — METOCLOPRAMIDE HCL 5 MG/ML IJ SOLN: 5.0000 mg | Freq: Three times a day (TID) | INTRAMUSCULAR | Status: DC | PRN

## 2022-07-20 MED ORDER — CHLORHEXIDINE GLUCONATE 0.12 % MT SOLN
15.0000 mL | Freq: Once | OROMUCOSAL | Status: AC
  Administered 2022-07-20: 15 mL via OROMUCOSAL

## 2022-07-20 MED ORDER — ALUM & MAG HYDROXIDE-SIMETH 200-200-20 MG/5ML PO SUSP: 30.0000 mL | ORAL | Status: DC | PRN

## 2022-07-20 MED ORDER — DOCUSATE SODIUM 100 MG PO CAPS
100.0000 mg | ORAL_CAPSULE | Freq: Two times a day (BID) | ORAL | Status: DC
  Administered 2022-07-20 – 2022-07-22 (×4): 100 mg via ORAL
  Filled 2022-07-20 (×4): qty 1

## 2022-07-20 MED ORDER — ONDANSETRON HCL 4 MG/2ML IJ SOLN
INTRAMUSCULAR | Status: AC
  Filled 2022-07-20: qty 2

## 2022-07-20 MED ORDER — MIDAZOLAM HCL 2 MG/2ML IJ SOLN
INTRAMUSCULAR | Status: AC
  Administered 2022-07-20: 1 mg
  Filled 2022-07-20: qty 2

## 2022-07-20 MED ORDER — KETAMINE HCL 50 MG/5ML IJ SOSY
PREFILLED_SYRINGE | INTRAMUSCULAR | Status: AC
  Filled 2022-07-20: qty 5

## 2022-07-20 MED ORDER — PHENOL 1.4 % MT LIQD: 1.0000 | OROMUCOSAL | Status: DC | PRN

## 2022-07-20 MED ORDER — BUPIVACAINE LIPOSOME 1.3 % IJ SUSP
INTRAMUSCULAR | Status: AC
  Filled 2022-07-20: qty 20

## 2022-07-20 MED ORDER — 0.9 % SODIUM CHLORIDE (POUR BTL) OPTIME
TOPICAL | Status: DC | PRN
  Administered 2022-07-20: 1000 mL

## 2022-07-20 MED ORDER — HYDROMORPHONE HCL 1 MG/ML IJ SOLN
0.5000 mg | INTRAMUSCULAR | Status: DC | PRN
  Administered 2022-07-20 – 2022-07-21 (×2): 1 mg via INTRAVENOUS
  Filled 2022-07-20 (×2): qty 1

## 2022-07-20 MED ORDER — PHENYLEPHRINE HCL-NACL 20-0.9 MG/250ML-% IV SOLN
INTRAVENOUS | Status: DC | PRN
  Administered 2022-07-20: 30 ug/min via INTRAVENOUS

## 2022-07-20 MED ORDER — ACETAMINOPHEN 500 MG PO TABS: 1000.0000 mg | ORAL_TABLET | Freq: Once | ORAL | Status: AC

## 2022-07-20 MED ORDER — ONDANSETRON HCL 4 MG PO TABS: 4.0000 mg | ORAL_TABLET | Freq: Four times a day (QID) | ORAL | Status: DC | PRN

## 2022-07-20 MED ORDER — ACETAMINOPHEN 500 MG PO TABS
ORAL_TABLET | ORAL | Status: AC
  Administered 2022-07-20: 1000 mg via ORAL
  Filled 2022-07-20: qty 2

## 2022-07-20 MED ORDER — FENTANYL CITRATE (PF) 100 MCG/2ML IJ SOLN
INTRAMUSCULAR | Status: DC | PRN
  Administered 2022-07-20 (×2): 25 ug via INTRAVENOUS
  Administered 2022-07-20: 50 ug via INTRAVENOUS

## 2022-07-20 MED ORDER — POVIDONE-IODINE 10 % EX SWAB: 2.0000 | Freq: Once | CUTANEOUS | Status: AC

## 2022-07-20 MED ORDER — SODIUM CHLORIDE 0.9 % IR SOLN
Status: DC | PRN
  Administered 2022-07-20: 1000 mL

## 2022-07-20 MED ORDER — ROPIVACAINE HCL 5 MG/ML IJ SOLN
INTRAMUSCULAR | Status: DC | PRN
  Administered 2022-07-20: 30 mL via PERINEURAL

## 2022-07-20 MED ORDER — CEFAZOLIN SODIUM-DEXTROSE 2-4 GM/100ML-% IV SOLN
2.0000 g | Freq: Four times a day (QID) | INTRAVENOUS | Status: AC
  Administered 2022-07-20 – 2022-07-21 (×2): 2 g via INTRAVENOUS
  Filled 2022-07-20 (×2): qty 100

## 2022-07-20 MED ORDER — ORAL CARE MOUTH RINSE: 15.0000 mL | OROMUCOSAL | Status: DC | PRN

## 2022-07-20 MED ORDER — LIDOCAINE HCL (CARDIAC) PF 100 MG/5ML IV SOSY
PREFILLED_SYRINGE | INTRAVENOUS | Status: DC | PRN
  Administered 2022-07-20: 40 mg via INTRAVENOUS

## 2022-07-20 MED ORDER — DEXAMETHASONE SODIUM PHOSPHATE 10 MG/ML IJ SOLN
10.0000 mg | Freq: Once | INTRAMUSCULAR | Status: AC
  Administered 2022-07-21: 10 mg via INTRAVENOUS
  Filled 2022-07-20: qty 1

## 2022-07-20 MED ORDER — BUPIVACAINE LIPOSOME 1.3 % IJ SUSP
INTRAMUSCULAR | Status: DC | PRN
  Administered 2022-07-20: 20 mL

## 2022-07-20 MED ORDER — SODIUM CHLORIDE 0.9% FLUSH
INTRAVENOUS | Status: DC | PRN
  Administered 2022-07-20: 30 mL

## 2022-07-20 MED ORDER — FENTANYL CITRATE (PF) 100 MCG/2ML IJ SOLN
INTRAMUSCULAR | Status: AC
  Filled 2022-07-20: qty 2

## 2022-07-20 MED ORDER — SODIUM CHLORIDE (PF) 0.9 % IJ SOLN
INTRAMUSCULAR | Status: AC
  Filled 2022-07-20: qty 10

## 2022-07-20 MED ORDER — CEFAZOLIN IN SODIUM CHLORIDE 3-0.9 GM/100ML-% IV SOLN
INTRAVENOUS | Status: AC
  Filled 2022-07-20: qty 100

## 2022-07-20 MED ORDER — ACETAMINOPHEN 500 MG PO TABS
1000.0000 mg | ORAL_TABLET | Freq: Four times a day (QID) | ORAL | Status: AC
  Administered 2022-07-20 – 2022-07-21 (×4): 1000 mg via ORAL
  Filled 2022-07-20 (×4): qty 2

## 2022-07-20 MED ORDER — BUPIVACAINE HCL (PF) 0.25 % IJ SOLN
INTRAMUSCULAR | Status: AC
  Filled 2022-07-20: qty 30

## 2022-07-20 MED ORDER — LIDOCAINE HCL (PF) 2 % IJ SOLN
INTRAMUSCULAR | Status: AC
  Filled 2022-07-20: qty 5

## 2022-07-20 MED ORDER — LACTATED RINGERS IV SOLN: INTRAVENOUS | Status: DC

## 2022-07-20 MED ORDER — ORAL CARE MOUTH RINSE: 15.0000 mL | Freq: Once | OROMUCOSAL | Status: AC

## 2022-07-20 MED ORDER — DEXMEDETOMIDINE HCL IN NACL 80 MCG/20ML IV SOLN
INTRAVENOUS | Status: DC | PRN
  Administered 2022-07-20 (×3): 4 ug via INTRAVENOUS

## 2022-07-20 MED ORDER — DEXAMETHASONE SODIUM PHOSPHATE 10 MG/ML IJ SOLN
8.0000 mg | Freq: Once | INTRAMUSCULAR | Status: AC
  Administered 2022-07-20: 4 mg via INTRAVENOUS

## 2022-07-20 MED ORDER — POLYETHYLENE GLYCOL 3350 17 G PO PACK: 17.0000 g | PACK | Freq: Every day | ORAL | Status: DC | PRN

## 2022-07-20 MED ORDER — ZOLPIDEM TARTRATE 5 MG PO TABS
5.0000 mg | ORAL_TABLET | Freq: Every evening | ORAL | Status: DC | PRN
  Administered 2022-07-21: 5 mg via ORAL
  Filled 2022-07-20: qty 1

## 2022-07-20 MED ORDER — CEFAZOLIN IN SODIUM CHLORIDE 3-0.9 GM/100ML-% IV SOLN
3.0000 g | INTRAVENOUS | Status: AC
  Administered 2022-07-20: 3 g via INTRAVENOUS

## 2022-07-20 MED ORDER — OXYCODONE HCL 5 MG PO TABS
5.0000 mg | ORAL_TABLET | ORAL | Status: DC | PRN
  Administered 2022-07-20 – 2022-07-22 (×2): 10 mg via ORAL
  Filled 2022-07-20 (×2): qty 2

## 2022-07-20 MED ORDER — HYDROMORPHONE HCL 1 MG/ML IJ SOLN
0.2500 mg | INTRAMUSCULAR | Status: DC | PRN
  Administered 2022-07-20 (×4): 0.5 mg via INTRAVENOUS

## 2022-07-20 MED ORDER — FENTANYL CITRATE PF 50 MCG/ML IJ SOSY
PREFILLED_SYRINGE | INTRAMUSCULAR | Status: AC
  Administered 2022-07-20: 50 ug
  Filled 2022-07-20: qty 2

## 2022-07-20 MED ORDER — ASPIRIN 81 MG PO CHEW
81.0000 mg | CHEWABLE_TABLET | Freq: Two times a day (BID) | ORAL | Status: DC
  Administered 2022-07-20 – 2022-07-22 (×4): 81 mg via ORAL
  Filled 2022-07-20 (×4): qty 1

## 2022-07-20 MED ORDER — DEXAMETHASONE SODIUM PHOSPHATE 10 MG/ML IJ SOLN
INTRAMUSCULAR | Status: DC | PRN
  Administered 2022-07-20: 10 mg

## 2022-07-20 MED ORDER — ONDANSETRON HCL 4 MG/2ML IJ SOLN
INTRAMUSCULAR | Status: DC | PRN
  Administered 2022-07-20: 4 mg via INTRAVENOUS

## 2022-07-20 MED ORDER — GLYCOPYRROLATE 0.2 MG/ML IJ SOLN
INTRAMUSCULAR | Status: AC
  Filled 2022-07-20: qty 1

## 2022-07-20 MED ORDER — METHOCARBAMOL 500 MG IVPB - SIMPLE MED
INTRAVENOUS | Status: AC
  Filled 2022-07-20: qty 55

## 2022-07-20 MED ORDER — HYDROMORPHONE HCL 1 MG/ML IJ SOLN
INTRAMUSCULAR | Status: AC
  Filled 2022-07-20: qty 1

## 2022-07-20 MED ORDER — BUPIVACAINE LIPOSOME 1.3 % IJ SUSP: 20.0000 mL | Freq: Once | INTRAMUSCULAR | Status: AC

## 2022-07-20 MED ORDER — METHOCARBAMOL 500 MG PO TABS
500.0000 mg | ORAL_TABLET | Freq: Four times a day (QID) | ORAL | Status: DC | PRN
  Administered 2022-07-22: 500 mg via ORAL
  Filled 2022-07-20: qty 1

## 2022-07-20 MED ORDER — HYDROCHLOROTHIAZIDE 12.5 MG PO TABS
12.5000 mg | ORAL_TABLET | Freq: Every day | ORAL | Status: DC
  Administered 2022-07-22: 12.5 mg via ORAL
  Filled 2022-07-20 (×2): qty 1

## 2022-07-20 MED ORDER — BISACODYL 10 MG RE SUPP: 10.0000 mg | Freq: Every day | RECTAL | Status: DC | PRN

## 2022-07-20 MED ORDER — MENTHOL 3 MG MT LOZG: 1.0000 | LOZENGE | OROMUCOSAL | Status: DC | PRN

## 2022-07-20 MED ORDER — FUROSEMIDE 40 MG PO TABS: 40.0000 mg | ORAL_TABLET | Freq: Every day | ORAL | Status: DC | PRN

## 2022-07-20 MED ORDER — BUPIVACAINE IN DEXTROSE 0.75-8.25 % IT SOLN
INTRATHECAL | Status: DC | PRN
  Administered 2022-07-20: 1.6 mL via INTRATHECAL

## 2022-07-20 MED ORDER — WATER FOR IRRIGATION, STERILE IR SOLN
Status: DC | PRN
  Administered 2022-07-20: 2000 mL

## 2022-07-20 MED ORDER — TRANEXAMIC ACID-NACL 1000-0.7 MG/100ML-% IV SOLN
INTRAVENOUS | Status: AC
  Filled 2022-07-20: qty 100

## 2022-07-20 MED ORDER — OXYCODONE HCL 5 MG PO TABS
5.0000 mg | ORAL_TABLET | Freq: Once | ORAL | Status: AC | PRN
  Administered 2022-07-20: 5 mg via ORAL

## 2022-07-20 MED ORDER — IRBESARTAN 150 MG PO TABS
300.0000 mg | ORAL_TABLET | Freq: Every day | ORAL | Status: DC
  Administered 2022-07-22: 300 mg via ORAL
  Filled 2022-07-20 (×2): qty 2

## 2022-07-20 MED ORDER — PROPOFOL 10 MG/ML IV BOLUS
INTRAVENOUS | Status: DC | PRN
  Administered 2022-07-20: 10 mg via INTRAVENOUS

## 2022-07-20 MED ORDER — EPINEPHRINE PF 1 MG/ML IJ SOLN
INTRAMUSCULAR | Status: AC
  Filled 2022-07-20: qty 1

## 2022-07-20 MED ORDER — LORATADINE 10 MG PO TABS
10.0000 mg | ORAL_TABLET | Freq: Every day | ORAL | Status: DC
  Administered 2022-07-22: 10 mg via ORAL
  Filled 2022-07-20 (×2): qty 1

## 2022-07-20 MED ORDER — PROPOFOL 500 MG/50ML IV EMUL
INTRAVENOUS | Status: DC | PRN
  Administered 2022-07-20: 50 ug/kg/min via INTRAVENOUS

## 2022-07-20 MED ORDER — HYDROMORPHONE HCL 2 MG/ML IJ SOLN
INTRAMUSCULAR | Status: AC
  Filled 2022-07-20: qty 1

## 2022-07-20 MED ORDER — TRANEXAMIC ACID-NACL 1000-0.7 MG/100ML-% IV SOLN
1000.0000 mg | INTRAVENOUS | Status: AC
  Administered 2022-07-20: 1000 mg via INTRAVENOUS

## 2022-07-20 MED ORDER — GLYCOPYRROLATE 0.2 MG/ML IJ SOLN
INTRAMUSCULAR | Status: DC | PRN
  Administered 2022-07-20: .2 mg via INTRAVENOUS

## 2022-07-20 MED ORDER — ONDANSETRON HCL 4 MG/2ML IJ SOLN: 4.0000 mg | Freq: Once | INTRAMUSCULAR | Status: DC | PRN

## 2022-07-20 MED ORDER — ACETAMINOPHEN 325 MG PO TABS
325.0000 mg | ORAL_TABLET | Freq: Four times a day (QID) | ORAL | Status: DC | PRN
  Administered 2022-07-21 – 2022-07-22 (×2): 650 mg via ORAL
  Filled 2022-07-20 (×2): qty 2

## 2022-07-20 MED ORDER — OXYCODONE HCL 5 MG PO TABS
ORAL_TABLET | ORAL | Status: AC
  Filled 2022-07-20: qty 1

## 2022-07-20 MED ORDER — FLUTICASONE PROPIONATE 50 MCG/ACT NA SUSP: 1.0000 | Freq: Every day | NASAL | Status: DC | PRN

## 2022-07-20 MED ORDER — ONDANSETRON HCL 4 MG/2ML IJ SOLN: 4.0000 mg | Freq: Four times a day (QID) | INTRAMUSCULAR | Status: DC | PRN

## 2022-07-20 MED ORDER — OXYCODONE HCL 5 MG PO TABS
10.0000 mg | ORAL_TABLET | ORAL | Status: DC | PRN
  Administered 2022-07-20: 10 mg via ORAL
  Administered 2022-07-21 – 2022-07-22 (×5): 15 mg via ORAL
  Filled 2022-07-20 (×6): qty 3

## 2022-07-20 MED ORDER — MAGNESIUM CITRATE PO SOLN: 1.0000 | Freq: Once | ORAL | Status: DC | PRN

## 2022-07-20 MED ORDER — AMISULPRIDE (ANTIEMETIC) 5 MG/2ML IV SOLN: 10.0000 mg | Freq: Once | INTRAVENOUS | Status: DC | PRN

## 2022-07-20 MED ORDER — PROPOFOL 1000 MG/100ML IV EMUL
INTRAVENOUS | Status: AC
  Filled 2022-07-20: qty 100

## 2022-07-20 MED ORDER — POTASSIUM CHLORIDE IN NACL 20-0.45 MEQ/L-% IV SOLN
INTRAVENOUS | Status: DC
  Filled 2022-07-20: qty 1000

## 2022-07-20 MED ORDER — METHOCARBAMOL 500 MG IVPB - SIMPLE MED
500.0000 mg | Freq: Four times a day (QID) | INTRAVENOUS | Status: DC | PRN
  Administered 2022-07-20: 500 mg via INTRAVENOUS

## 2022-07-20 MED ORDER — OXYCODONE HCL 5 MG PO TABS
ORAL_TABLET | ORAL | Status: AC
  Filled 2022-07-20: qty 2

## 2022-07-20 MED ORDER — OXYCODONE HCL 5 MG/5ML PO SOLN: 5.0000 mg | Freq: Once | ORAL | Status: AC | PRN

## 2022-07-20 MED ORDER — CELECOXIB 200 MG PO CAPS
200.0000 mg | ORAL_CAPSULE | Freq: Two times a day (BID) | ORAL | Status: DC
  Administered 2022-07-20 – 2022-07-22 (×4): 200 mg via ORAL
  Filled 2022-07-20 (×4): qty 1

## 2022-07-20 SURGICAL SUPPLY — 57 items
BAG COUNTER SPONGE SURGICOUNT (BAG) IMPLANT
BAG SPNG CNTER NS LX DISP (BAG) ×1
BASEPLATE TIBIAL SZ2 TRI (Joint) IMPLANT
BLADE SAG 18X100X1.27 (BLADE) ×1 IMPLANT
BLADE SAGITTAL 25.0X1.37X90 (BLADE) ×1 IMPLANT
BLADE SURG 15 STRL LF DISP TIS (BLADE) ×1 IMPLANT
BLADE SURG 15 STRL SS (BLADE) ×1
BNDG CMPR MED 10X6 ELC LF (GAUZE/BANDAGES/DRESSINGS) ×1
BNDG ELASTIC 6X10 VLCR STRL LF (GAUZE/BANDAGES/DRESSINGS) ×1 IMPLANT
BOWL SMART MIX CTS (DISPOSABLE) IMPLANT
BSPLAT TIB 2 KN TRITANIUM (Joint) ×1 IMPLANT
CLSR STERI-STRIP ANTIMIC 1/2X4 (GAUZE/BANDAGES/DRESSINGS) ×2 IMPLANT
COMP FEMORAL CEMNTLESS SZ2 TRI (Joint) ×1 IMPLANT
COMPONENT FEMRL CMNTLS SZ2 TRI (Joint) IMPLANT
COVER SURGICAL LIGHT HANDLE (MISCELLANEOUS) ×1 IMPLANT
CUFF TOURN SGL QUICK 34 (TOURNIQUET CUFF) ×1
CUFF TRNQT CYL 34X4.125X (TOURNIQUET CUFF) ×1 IMPLANT
DRAPE U-SHAPE 47X51 STRL (DRAPES) ×1 IMPLANT
DRSG MEPILEX POST OP 4X12 (GAUZE/BANDAGES/DRESSINGS) ×1 IMPLANT
DRSG MEPILEX POST OP 4X8 (GAUZE/BANDAGES/DRESSINGS) IMPLANT
DURAPREP 26ML APPLICATOR (WOUND CARE) ×2 IMPLANT
ELECT REM PT RETURN 15FT ADLT (MISCELLANEOUS) ×1 IMPLANT
GLOVE BIO SURGEON STRL SZ7.5 (GLOVE) ×2 IMPLANT
GLOVE BIOGEL PI IND STRL 7.5 (GLOVE) ×1 IMPLANT
GLOVE BIOGEL PI IND STRL 8 (GLOVE) ×1 IMPLANT
GLOVE SURG SYN 7.5 E (GLOVE) ×1 IMPLANT
GLOVE SURG SYN 7.5 PF PI (GLOVE) ×1 IMPLANT
GOWN STRL REUS W/ TWL LRG LVL3 (GOWN DISPOSABLE) ×1 IMPLANT
GOWN STRL REUS W/ TWL XL LVL3 (GOWN DISPOSABLE) ×1 IMPLANT
GOWN STRL REUS W/TWL LRG LVL3 (GOWN DISPOSABLE) ×1
GOWN STRL REUS W/TWL XL LVL3 (GOWN DISPOSABLE) ×1
HANDPIECE INTERPULSE COAX TIP (DISPOSABLE) ×1
HOLDER FOLEY CATH W/STRAP (MISCELLANEOUS) IMPLANT
IMMOBILIZER KNEE 20 (SOFTGOODS) ×1
IMMOBILIZER KNEE 20 THIGH 36 (SOFTGOODS) IMPLANT
IMMOBILIZER KNEE 22 UNIV (SOFTGOODS) IMPLANT
KIT TURNOVER KIT A (KITS) IMPLANT
KNEE PATELLA ASYMMETRIC 9X29 (Knees) IMPLANT
LINER TIB BEAR ASF SZ2 9 (Liner) IMPLANT
MANIFOLD NEPTUNE II (INSTRUMENTS) ×1 IMPLANT
NS IRRIG 1000ML POUR BTL (IV SOLUTION) ×1 IMPLANT
PACK TOTAL KNEE CUSTOM (KITS) ×1 IMPLANT
PIN FLUTED HEDLESS FIX 3.5X1/8 (PIN) IMPLANT
PROTECTOR NERVE ULNAR (MISCELLANEOUS) ×1 IMPLANT
SET HNDPC FAN SPRY TIP SCT (DISPOSABLE) ×1 IMPLANT
SPIKE FLUID TRANSFER (MISCELLANEOUS) ×1 IMPLANT
SUT MNCRL AB 3-0 PS2 18 (SUTURE) ×1 IMPLANT
SUT VIC AB 0 CT1 36 (SUTURE) ×1 IMPLANT
SUT VIC AB 1 CT1 36 (SUTURE) ×1 IMPLANT
SUT VIC AB 2-0 CT1 27 (SUTURE) ×1
SUT VIC AB 2-0 CT1 TAPERPNT 27 (SUTURE) ×1 IMPLANT
TIBIAL BASEPLATE SZ2 TRI (Joint) ×1 IMPLANT
TOWEL GREEN STERILE FF (TOWEL DISPOSABLE) ×1 IMPLANT
TRAY FOLEY MTR SLVR 14FR STAT (SET/KITS/TRAYS/PACK) IMPLANT
TRAY FOLEY MTR SLVR 16FR STAT (SET/KITS/TRAYS/PACK) IMPLANT
TUBE SUCTION HIGH CAP CLEAR NV (SUCTIONS) ×1 IMPLANT
WRAP KNEE MAXI GEL POST OP (GAUZE/BANDAGES/DRESSINGS) ×1 IMPLANT

## 2022-07-20 NOTE — Op Note (Signed)
DATE OF SURGERY:  07/20/2022 TIME: 1:48 PM  PATIENT NAME:  Sarah Walker   AGE: 72 y.o.    PRE-OPERATIVE DIAGNOSIS:  OA LEFT KNEE  POST-OPERATIVE DIAGNOSIS:  Same  PROCEDURE:  Procedure(s): TOTAL KNEE ARTHROPLASTY   SURGEON:  Sheral Apley, MD   ASSISTANT:  Levester Fresh, PA-C, she was present and scrubbed throughout the case, critical for completion in a timely fashion, and for retraction, instrumentation, and closure.    OPERATIVE IMPLANTS: [employer] Triathlon CR. Press fit knee  Femur size 2, Tibia size 2, Patella size 29 3-peg oval button, with a 9 mm polyethylene insert.   PREOPERATIVE INDICATIONS:  Sarah Walker is a 72 y.o. year old female with end stage bone on bone degenerative arthritis of the knee who failed conservative treatment, including injections, antiinflammatories, activity modification, and assistive devices, and had significant impairment of their activities of daily living, and elected for Total Knee Arthroplasty.   The risks, benefits, and alternatives were discussed at length including but not limited to the risks of infection, bleeding, nerve injury, stiffness, blood clots, the need for revision surgery, cardiopulmonary complications, among others, and they were willing to proceed.   OPERATIVE DESCRIPTION:  The patient was brought to the operative room and placed in a supine position.  General anesthesia was administered.  IV antibiotics were given.  The lower extremity was prepped and draped in the usual sterile fashion.  Time out was performed.  The leg was elevated and exsanguinated and the tourniquet was inflated.  Anterior approach was performed.  The patella was everted and osteophytes were removed.  The anterior horn of the medial and lateral meniscus was removed.   The distal femur was opened with the drill and the intramedullary distal femoral cutting jig was utilized, set at 5 degrees resecting 9 mm off the distal femur.  Care was taken to  protect the collateral ligaments.  The distal femoral sizing jig was applied, taking care to avoid notching.  Then the 4-in-1 cutting jig was applied and the anterior and posterior femur was cut, along with the chamfer cuts.  All posterior osteophytes were removed.  The flexion gap was then measured and was symmetric with the extension gap.  Then the extramedullary tibial cutting jig was utilized making the appropriate cut using the anterior tibial crest as a reference building in appropriate posterior slope.  Care was taken during the cut to protect the medial and collateral ligaments.  The proximal tibia was removed along with the posterior horns of the menisci.    I completed the distal femoral preparation using the appropriate jig to prepare the box.  The patella was then measured, and cut with the saw.    The proximal tibia sized and prepared accordingly with the reamer and the punch, and then all components were trialed with the above sized poly insert.  The knee was found to have excellent balance and full motion.    The above named components were then impacted into place and Poly tibial piece and patella were inserted.  I was very happy with his stability and ROM  I performed a periarticular injection with Exparel  The knee was easily taken through a range of motion and the patella tracked well and the knee irrigated copiously and the parapatellar and subcutaneous tissue closed with vicryl, and monocryl with steri strips for the skin.  The incision was dressed with sterile gauze and the tourniquet released and the patient was awakened and returned to the  PACU in stable and satisfactory condition.  There were no complications.  Total tourniquet time was roughly 75 minutes.   POSTOPERATIVE PLAN: post op Abx, DVT px: SCD's, TED's, Early ambulation and chemical px

## 2022-07-20 NOTE — Anesthesia Procedure Notes (Signed)
Spinal  Patient location during procedure: OR Start time: 07/20/2022 12:45 PM End time: 07/20/2022 12:50 PM Staffing Performed: anesthesiologist  Anesthesiologist: Atilano Median, DO Performed by: Atilano Median, DO Authorized by: Atilano Median, DO   Preanesthetic Checklist Completed: patient identified, IV checked, site marked, risks and benefits discussed, surgical consent, monitors and equipment checked, pre-op evaluation and timeout performed Spinal Block Patient position: sitting Prep: DuraPrep Patient monitoring: heart rate, cardiac monitor, continuous pulse ox and blood pressure Approach: midline Location: L4-5 Injection technique: single-shot Needle Needle type: Quincke  Needle gauge: 22 G Needle length: 12.7 cm Assessment Events: CSF return Additional Notes Patient identified. Risks/Benefits/Options discussed with patient including but not limited to bleeding, infection, nerve damage, paralysis, failed block, incomplete pain control, headache, blood pressure changes, nausea, vomiting, reactions to medications, itching and postpartum back pain. Confirmed with bedside nurse the patient's most recent platelet count. Confirmed with patient that they are not currently taking any anticoagulation, have any bleeding history or any family history of bleeding disorders. Patient expressed understanding and wished to proceed. All questions were answered. Sterile technique was used throughout the entire procedure. Please see nursing notes for vital signs. Warning signs of high block given to the patient including shortness of breath, tingling/numbness in hands, complete motor block, or any concerning symptoms with instructions to call for help. Patient was given instructions on fall risk and not to get out of bed. All questions and concerns addressed with instructions to call with any issues or inadequate analgesia.

## 2022-07-20 NOTE — Interval H&P Note (Signed)
History and Physical Interval Note:  07/20/2022 11:32 AM  Sarah Walker  has presented today for surgery, with the diagnosis of OA LEFT KNEE.  The various methods of treatment have been discussed with the patient and family. After consideration of risks, benefits and other options for treatment, the patient has consented to  Procedure(s): TOTAL KNEE ARTHROPLASTY (Left) as a surgical intervention.  The patient's history has been reviewed, patient examined, no change in status, stable for surgery.  I have reviewed the patient's chart and labs.  Questions were answered to the patient's satisfaction.     Sheral Apley

## 2022-07-20 NOTE — Transfer of Care (Signed)
Immediate Anesthesia Transfer of Care Note  Patient: Sarah Walker  Procedure(s) Performed: TOTAL KNEE ARTHROPLASTY (Left: Knee)  Patient Location: PACU  Anesthesia Type:Spinal  Level of Consciousness: awake, alert , oriented, and patient cooperative  Airway & Oxygen Therapy: Patient Spontanous Breathing and Patient connected to face mask oxygen  Post-op Assessment: Report given to RN and Post -op Vital signs reviewed and stable  Post vital signs: Reviewed and stable  Last Vitals:  Vitals Value Taken Time  BP 104/67 07/20/22 1430  Temp    Pulse 78 07/20/22 1432  Resp 13 07/20/22 1432  SpO2 100 % 07/20/22 1432  Vitals shown include unfiled device data.  Last Pain:  Vitals:   07/20/22 1210  TempSrc:   PainSc: 0-No pain         Complications: No notable events documented.

## 2022-07-20 NOTE — Anesthesia Procedure Notes (Signed)
Anesthesia Regional Block: Adductor canal block   Pre-Anesthetic Checklist: , timeout performed,  Correct Patient, Correct Site, Correct Laterality,  Correct Procedure, Correct Position, site marked,  Risks and benefits discussed,  Surgical consent,  Pre-op evaluation,  At surgeon's request and post-op pain management  Laterality: Left  Prep: Dura Prep       Needles:  Injection technique: Single-shot  Needle Type: Echogenic Stimulator Needle     Needle Length: 10cm  Needle Gauge: 20     Additional Needles:   Procedures:,,,, ultrasound used (permanent image in chart),,    Narrative:  Start time: 07/20/2022 10:50 AM End time: 07/20/2022 10:55 AM Injection made incrementally with aspirations every 5 mL.  Performed by: Personally  Anesthesiologist: Atilano Median, DO  Additional Notes: Patient identified. Risks/Benefits/Options discussed with patient including but not limited to bleeding, infection, nerve damage, failed block, incomplete pain control. Patient expressed understanding and wished to proceed. All questions were answered. Sterile technique was used throughout the entire procedure. Please see nursing notes for vital signs. Aspirated in 5cc intervals with injection for negative confirmation. Patient was given instructions on fall risk and not to get out of bed. All questions and concerns addressed with instructions to call with any issues or inadequate analgesia.

## 2022-07-21 ENCOUNTER — Encounter (HOSPITAL_COMMUNITY): Payer: Self-pay | Admitting: Orthopedic Surgery

## 2022-07-21 DIAGNOSIS — Z96641 Presence of right artificial hip joint: Secondary | ICD-10-CM | POA: Diagnosis not present

## 2022-07-21 DIAGNOSIS — Z96659 Presence of unspecified artificial knee joint: Secondary | ICD-10-CM

## 2022-07-21 DIAGNOSIS — Z7982 Long term (current) use of aspirin: Secondary | ICD-10-CM | POA: Diagnosis not present

## 2022-07-21 DIAGNOSIS — Z79899 Other long term (current) drug therapy: Secondary | ICD-10-CM | POA: Diagnosis not present

## 2022-07-21 DIAGNOSIS — Z86711 Personal history of pulmonary embolism: Secondary | ICD-10-CM | POA: Diagnosis not present

## 2022-07-21 DIAGNOSIS — I10 Essential (primary) hypertension: Secondary | ICD-10-CM | POA: Diagnosis not present

## 2022-07-21 DIAGNOSIS — M1712 Unilateral primary osteoarthritis, left knee: Secondary | ICD-10-CM | POA: Diagnosis not present

## 2022-07-21 NOTE — Evaluation (Signed)
Physical Therapy Evaluation Patient Details Name: Sarah Walker MRN: [id number] DOB: May 04, 1950 Today's Date: 07/21/2022  History of Present Illness  Shalyn Koral is a 72 yo female s/p L TKA 07/20/22. PMH: HTN, ovarian cancer, peripheral neuropathy, pulmonary embolism, R THA 08/26/20  Clinical Impression  Pt is s/p L TKA resulting in the deficits listed below (see PT Problem List). Pt from home with spouse, reports 2 threshold steps to enter the home (1 at the driveway and 1 onto the deck) then 4 steps once in the front door of the split level home. Pt reports using SPC or RW around the home and sometimes no AD; using RW in the community. Pt with chronic peripheral neuropathy from chemo throughout BLE and noted to have RLE internally rotated in supine and standing that pt reports is baseline. Pt currently significantly limited by pain, reports 8/10 pain while seated EOB and 10/10 pain with attempted steps at bedside. Pt unable to lift RLE off of floor due to limited weight-shifting to LLE and BUE on RW because of high pain reports. Pt is a high fall risk, unable to take steps on level ground or attempt stair training this session. Pt will benefit from acute skilled PT to increase their independence and safety with mobility to allow discharge.          Assistance Recommended at Discharge Frequent or constant Supervision/Assistance  If plan is discharge home, recommend the following:  Can travel by private vehicle  A lot of help with walking and/or transfers;A lot of help with bathing/dressing/bathroom;Assistance with cooking/housework;Assist for transportation;Help with stairs or ramp for entrance        Equipment Recommendations None recommended by PT  Recommendations for Other Services       Functional Status Assessment Patient has had a recent decline in their functional status and demonstrates the ability to make significant improvements in function in a reasonable and predictable amount  of time.     Precautions / Restrictions Precautions Precautions: Fall;Knee Restrictions Weight Bearing Restrictions: No      Mobility  Bed Mobility Overal bed mobility: Needs Assistance Bed Mobility: Supine to Sit, Sit to Supine     Supine to sit: Mod assist Sit to supine: Mod assist   General bed mobility comments: mod A for LLE and trunk to mobilize to EOB, strong use of bedrail, significantly increasead time; mod A to lift BLE back into bed and scoot up with bedassist    Transfers Overall transfer level: Needs assistance Equipment used: Rolling walker (2 wheels) Transfers: Sit to/from Stand Sit to Stand: Mod assist, From elevated surface           General transfer comment: slow to power up, prefers to pull on braced RW, strong mod A to power up from elevated bed surface, verbal cues for BUE and BLE engagement, hip extension noted last    Ambulation/Gait               General Gait Details: pt able to clear LLE off of floor in static marching, unable to weight shift onto LLE and BUE to lift RLE off of floor due to pain  Stairs            Wheelchair Mobility     Tilt Bed    Modified Rankin (Stroke Patients Only)       Balance Overall balance assessment: Needs assistance Sitting-balance support: Feet supported Sitting balance-Leahy Scale: Good     Standing balance support: Reliant on assistive device  for balance, During functional activity, Bilateral upper extremity supported Standing balance-Leahy Scale: Poor                               Pertinent Vitals/Pain Pain Assessment Pain Assessment: 0-10 Pain Score: 10-Worst pain ever Pain Location: L knee Pain Descriptors / Indicators: Discomfort, Grimacing, Guarding Pain Intervention(s): Limited activity within patient's tolerance, Monitored during session, Premedicated before session, Repositioned, Ice applied    Home Living Family/patient expects to be discharged to:: Private  residence Living Arrangements: Spouse/significant other Available Help at Discharge: Family;Available 24 hours/day Type of Home: House Home Access: Stairs to enter Entrance Stairs-Rails: None Entrance Stairs-Number of Steps: 1+1 Alternate Level Stairs-Number of Steps: 4 Home Layout: Multi-level;Other (Comment) (split level) Home Equipment: Rolling Walker (2 wheels);Cane - single point;Grab bars - toilet;Grab bars - tub/shower;BSC/3in1      Prior Function Prior Level of Function : Independent/Modified Independent             Mobility Comments: pt reports using SPC or RW in the home initially progressing to no AD in the home after moving around in the morning; using RW in the community ADLs Comments: pt reports ind with ADLs/IADLs     Hand Dominance   Dominant Hand: Right    Extremity/Trunk Assessment   Upper Extremity Assessment Upper Extremity Assessment: Overall WFL for tasks assessed    Lower Extremity Assessment Lower Extremity Assessment: RLE deficits/detail;LLE deficits/detail RLE Deficits / Details: ankle AROM WFL, hip and knee frossly 3/5, LE appears internally rotated in supine and standing with toes noticably facing LLE RLE Sensation: history of peripheral neuropathy (from chemo per pt) RLE Coordination: WNL LLE Deficits / Details: ankle AROM WFL, active quad set, unable to perform SLR off of bed, hip grossly 2-/5 LLE Sensation: history of peripheral neuropathy (from chemo per pt) LLE Coordination: WNL    Cervical / Trunk Assessment Cervical / Trunk Assessment: Normal  Communication   Communication: No difficulties  Cognition Arousal/Alertness: Awake/alert Behavior During Therapy: WFL for tasks assessed/performed Overall Cognitive Status: Within Functional Limits for tasks assessed                                          General Comments      Exercises Total Joint Exercises Ankle Circles/Pumps: AROM, Both, 10 reps, Supine Quad  Sets: AROM, Strengthening, Left, 5 reps Straight Leg Raises:  (attempted, unable to clear LE from bed)   Assessment/Plan    PT Assessment Patient needs continued PT services  PT Problem List Decreased strength;Decreased range of motion;Decreased activity tolerance;Decreased balance;Decreased mobility;Decreased knowledge of use of DME;Decreased safety awareness;Decreased knowledge of precautions;Obesity;Pain;Impaired sensation       PT Treatment Interventions DME instruction;Gait training;Stair training;Functional mobility training;Therapeutic activities;Therapeutic exercise;Balance training;Neuromuscular re-education;Patient/family education;Modalities;Manual techniques    PT Goals (Current goals can be found in the Care Plan section)  Acute Rehab PT Goals Patient Stated Goal: return home PT Goal Formulation: With patient/family Time For Goal Achievement: 08/04/22 Potential to Achieve Goals: Good    Frequency 7X/week     Co-evaluation               AM-PAC PT "6 Clicks" Mobility  Outcome Measure Help needed turning from your back to your side while in a flat bed without using bedrails?: A Lot Help needed moving from lying on your back  to sitting on the side of a flat bed without using bedrails?: A Lot Help needed moving to and from a bed to a chair (including a wheelchair)?: A Lot Help needed standing up from a chair using your arms (e.g., wheelchair or bedside chair)?: A Lot Help needed to walk in hospital room?: Total Help needed climbing 3-5 steps with a railing? : Total 6 Click Score: 10    End of Session Equipment Utilized During Treatment: Gait belt Activity Tolerance: Patient limited by pain Patient left: in bed;with call bell/phone within reach;with bed alarm set;with family/visitor present Nurse Communication: Mobility status PT Visit Diagnosis: Other abnormalities of gait and mobility (R26.89);Muscle weakness (generalized) (M62.81);Pain Pain - Right/Left:  Left Pain - part of body: Knee    Time: 1610-9604 PT Time Calculation (min) (ACUTE ONLY): 42 min   Charges:   PT Evaluation $PT Eval Low Complexity: 1 Low PT Treatments $Therapeutic Exercise: 8-22 mins $Therapeutic Activity: 8-22 mins PT General Charges $$ ACUTE PT VISIT: 1 Visit         Tori Kenzo Ozment PT, DPT 07/21/22, 12:23 PM

## 2022-07-21 NOTE — Anesthesia Postprocedure Evaluation (Signed)
Anesthesia Post Note  Patient: Sarah Walker  Procedure(s) Performed: TOTAL KNEE ARTHROPLASTY (Left: Knee)     Patient location during evaluation: PACU Anesthesia Type: Regional, MAC and Spinal Level of consciousness: awake and alert Pain management: pain level controlled Vital Signs Assessment: post-procedure vital signs reviewed and stable Respiratory status: spontaneous breathing, nonlabored ventilation, respiratory function stable and patient connected to nasal cannula oxygen Cardiovascular status: stable and blood pressure returned to baseline Postop Assessment: no apparent nausea or vomiting Anesthetic complications: no   No notable events documented.  Last Vitals:  Vitals:   07/21/22 0540 07/21/22 0901  BP: (!) 116/59 106/72  Pulse: 64 61  Resp: 17 18  Temp: 36.6 C 36.7 C  SpO2: 95% 96%    Last Pain:  Vitals:   07/21/22 1026  TempSrc:   PainSc: 5                  Leandrew Keech P Yasiel Goyne

## 2022-07-21 NOTE — Progress Notes (Addendum)
Physical Therapy Treatment Patient Details Name: Sarah Walker MRN: [id number] DOB: [birth year] Today's Date: 07/21/2022   History of Present Illness Sarah Walker is a 72 yo female s/p L TKA 07/20/22. PMH: HTN, ovarian cancer, peripheral neuropathy, pulmonary embolism, R THA 08/26/20    PT Comments  Pt continues to report high pain, 7/10 pain at rest and up to 10/10 pain with ambulation and stair training. Pt needing min A to mod A+2 with transfers, slow to power up and verbal cues for hand placement and LE placement. Pt able to ambulate 50 ft this session with chair follow, slow step to gait progression. Pt needing +2 assist for stair training, once pt ascended 2 steps pt reports wanting to return down and unable to rise to top step to turn around so descends steps backwards, step to pattern. Pt is a high fall risk, unable to ascend/descend 4 steps for home environment; daughter present reports pt's spouse will be with her during the day and he can not assist this much. Plan to progress as able.    Assistance Recommended at Discharge Frequent or constant Supervision/Assistance  If plan is discharge home, recommend the following:  Can travel by private vehicle    A lot of help with walking and/or transfers;A lot of help with bathing/dressing/bathroom;Assistance with cooking/housework;Assist for transportation;Help with stairs or ramp for entrance      Equipment Recommendations  None recommended by PT    Recommendations for Other Services       Precautions / Restrictions Precautions Precautions: Fall;Knee Restrictions Weight Bearing Restrictions: No     Mobility  Bed Mobility Overal bed mobility: Needs Assistance Bed Mobility: Supine to Sit     Supine to sit: Mod assist Sit to supine: Mod assist   General bed mobility comments: increased time, mod A for LLE mobilization to EOB and trunk assist with pt using bedrail, increased time and effort    Transfers Overall transfer  level: Needs assistance Equipment used: Rolling walker (2 wheels) Transfers: Sit to/from Stand Sit to Stand: Mod assist, Min assist, +2 safety/equipment           General transfer comment: mod to min +2 assist depending on height of surface, verbal cues for LE placement and BUE assist to power up, slow to rise with hip extension last    Ambulation/Gait Ambulation/Gait assistance: Min guard, Min assist, +2 safety/equipment Gait Distance (Feet): 50 Feet Assistive device: Rolling walker (2 wheels) Gait Pattern/deviations: Step-to pattern, Decreased stride length, Decreased weight shift to left Gait velocity: decreased     General Gait Details: step to gait pattern with decreased LLE weightbearing and stance time due to pain, able to clear LLE from floor for short steps, min A to min G, no knee buckling noted, +2 with chair follow for safety   Stairs Stairs: Yes Stairs assistance: Min assist, +2 physical assistance Stair Management: One rail Left, Step to pattern Number of Stairs: 2 General stair comments: step to pattern with L handrail using 4" steps, pt ascends 2 steps with correct sequencing and declines going any further so descends backwards with step to pattern, min A+2 for safety   Wheelchair Mobility     Tilt Bed    Modified Rankin (Stroke Patients Only)       Balance Overall balance assessment: Needs assistance Sitting-balance support: Feet supported Sitting balance-Leahy Scale: Good     Standing balance support: Reliant on assistive device for balance, During functional activity, Bilateral upper extremity supported Standing balance-Leahy Scale:  Poor                              Cognition Arousal/Alertness: Awake/alert Behavior During Therapy: WFL for tasks assessed/performed Overall Cognitive Status: Within Functional Limits for tasks assessed                                          Exercises Total Joint Exercises Ankle  Circles/Pumps: AROM, Both, 10 reps, Supine Quad Sets: AROM, Strengthening, Both, 5 reps, Supine     General Comments        Pertinent Vitals/Pain Pain Assessment Pain Assessment: 0-10 Pain Score: 10-Worst pain ever Pain Location: 7/10 at rest, 8/10 sitting, 10/10 with amb and stairs Pain Descriptors / Indicators: Discomfort, Grimacing, Guarding Pain Intervention(s): Limited activity within patient's tolerance, Monitored during session, Premedicated before session, Repositioned, Ice applied    Home Living                          Prior Function            PT Goals (current goals can now be found in the care plan section) Acute Rehab PT Goals Patient Stated Goal: return home PT Goal Formulation: With patient/family Time For Goal Achievement: 08/04/22 Potential to Achieve Goals: Good Progress towards PT goals: Progressing toward goals    Frequency    7X/week      PT Plan Current plan remains appropriate    Co-evaluation              AM-PAC PT "6 Clicks" Mobility   Outcome Measure  Help needed turning from your back to your side while in a flat bed without using bedrails?: A Lot Help needed moving from lying on your back to sitting on the side of a flat bed without using bedrails?: A Lot Help needed moving to and from a bed to a chair (including a wheelchair)?: A Lot Help needed standing up from a chair using your arms (e.g., wheelchair or bedside chair)?: A Lot Help needed to walk in hospital room?: A Lot Help needed climbing 3-5 steps with a railing? : A Lot 6 Click Score: 12    End of Session Equipment Utilized During Treatment: Gait belt Activity Tolerance: Patient limited by pain Patient left: in chair;with call bell/phone within reach;with chair alarm set;with family/visitor present Nurse Communication: Mobility status PT Visit Diagnosis: Other abnormalities of gait and mobility (R26.89);Muscle weakness (generalized) (M62.81);Pain Pain -  Right/Left: Left Pain - part of body: Knee     Time: 1435-1510 PT Time Calculation (min) (ACUTE ONLY): 35 min  Charges:    $Gait Training: 8-22 mins $Therapeutic Activity: 8-22 mins PT General Charges $$ ACUTE PT VISIT: 1 Visit                     Sarah Walker PT, DPT 07/21/22, 3:20 PM

## 2022-07-21 NOTE — TOC Transition Note (Addendum)
Transition of Care [facility]) - CM/SW Discharge Note  Patient Details  Name: Sarah Walker MRN: [id number] Date of Birth: [birth year]  Transition of Care [facility]) CM/SW Contact:  Ewing Schlein, LCSW Phone Number: 07/21/2022, 10:31 AM  Clinical Narrative: Patient is expected to discharge home after working with PT. CSW met with patient to confirm discharge plan. Patient will go home with OPPT at [facility]. Patient has a rolling walker and was set up with a CPM at home prior to surgery by [employer], so there are no additional DME needs at this time. TOC signing off.  Addendum: Family requested call from CSW regarding changing the PT plan at discharge. CSW spoke with daughter, who is requesting HHPT. CSW explained that any changes to the PT discharge plan will need to come from the ortho team as TOC does not determine the discharge plan itself. CSW recommended daughter follow up with the ortho office regarding the discharge plan.  Final next level of care: OP Rehab Barriers to Discharge: No Barriers Identified  Patient Goals and CMS Choice Choice offered to / list presented to : NA  Discharge Plan and Services Additional resources added to the After Visit Summary for         DME Arranged: N/A DME Agency: NA  Social Determinants of Health (SDOH) Interventions SDOH Screenings   Food Insecurity: No Food Insecurity (07/20/2022)  Housing: Low Risk  (07/20/2022)  Transportation Needs: No Transportation Needs (07/20/2022)  Utilities: Not At Risk (07/20/2022)  Tobacco Use: Low Risk  (07/20/2022)   Readmission Risk Interventions     No data to display

## 2022-07-21 NOTE — Progress Notes (Signed)
   ORTHOPAEDIC PROGRESS NOTE  s/p Procedure(s): TOTAL KNEE ARTHROPLASTY on 07/20/2022 with Dr. Eulah Pont  SUBJECTIVE: Reports mild pain about operative site. She is hopeful to discharge home. She has 4 stairs in her house, lives in a split level. Usually ambulates with a walker.   No chest pain. No SOB. No nausea/vomiting. No other complaints.  OBJECTIVE: PE: General: sitting up in hospital bed, NAD Cardiac: regular rate Pulmonary: no increased work of breathing LLE: dressing CDI, intact EHL/TA/GSC, does have some numbness at the top of her foot but likely related to neuropathy, compartments soft and compressible, tolerates about 30 degrees of knee flexion, warm well perfused foot  Vitals:   07/21/22 0143 07/21/22 0540  BP: 119/66 (!) 116/59  Pulse: 66 64  Resp: 17 17  Temp: 97.7 F (36.5 C) 97.9 F (36.6 C)  SpO2: 95% 95%   Stable post-op images.   ASSESSMENT: Sarah Walker is a 72 y.o. female POD#1  PLAN: Weightbearing: WBAT LLE Insicional and dressing care: Reinforce dressings as needed Orthopedic device(s): None Showering: Post-op day #3 with assistance VTE prophylaxis: Aspirin 81 mg BID, SCDs, ambulation Pain control: PRN pain medications, minimize narcotics as able Follow - up plan: 2 weeks in office Dispo: TBD. PT eval today. Once patient passes PT and is able to safely discharge home and complete stair training, patient to discharge home.   Contact information:  Weekdays 8-5 Dr. Renaye Rakers, Alfonse Alpers PA-C, After hours and holidays please check [email or web address] for group call information for [facility]  Alfonse Alpers, PA-C 07/21/2022

## 2022-07-21 NOTE — Plan of Care (Signed)
  Problem: Education: Goal: Knowledge of the prescribed therapeutic regimen will improve Outcome: Progressing   Problem: Activity: Goal: Ability to avoid complications of mobility impairment will improve Outcome: Progressing Goal: Range of joint motion will improve Outcome: Progressing   Problem: Clinical Measurements: Goal: Postoperative complications will be avoided or minimized Outcome: Progressing   Problem: Pain Management: Goal: Pain level will decrease with appropriate interventions Outcome: Progressing   Problem: Skin Integrity: Goal: Will show signs of wound healing Outcome: Progressing   Problem: Education: Goal: Knowledge of General Education information will improve Description: Including pain rating scale, medication(s)/side effects and non-pharmacologic comfort measures Outcome: Progressing   Problem: Health Behavior/Discharge Planning: Goal: Ability to manage health-related needs will improve Outcome: Progressing   Problem: Clinical Measurements: Goal: Ability to maintain clinical measurements within normal limits will improve Outcome: Progressing Goal: Will remain free from infection Outcome: Progressing Goal: Diagnostic test results will improve Outcome: Progressing Goal: Respiratory complications will improve Outcome: Progressing Goal: Cardiovascular complication will be avoided Outcome: Progressing   Problem: Activity: Goal: Risk for activity intolerance will decrease Outcome: Progressing   Problem: Nutrition: Goal: Adequate nutrition will be maintained Outcome: Progressing   Problem: Coping: Goal: Level of anxiety will decrease Outcome: Progressing   Problem: Elimination: Goal: Will not experience complications related to bowel motility Outcome: Progressing Goal: Will not experience complications related to urinary retention Outcome: Progressing   Problem: Pain Managment: Goal: General experience of comfort will improve Outcome:  Progressing   Problem: Safety: Goal: Ability to remain free from injury will improve Outcome: Progressing   Problem: Skin Integrity: Goal: Risk for impaired skin integrity will decrease Outcome: Progressing   

## 2022-07-22 DIAGNOSIS — I10 Essential (primary) hypertension: Secondary | ICD-10-CM | POA: Diagnosis not present

## 2022-07-22 DIAGNOSIS — Z7982 Long term (current) use of aspirin: Secondary | ICD-10-CM | POA: Diagnosis not present

## 2022-07-22 DIAGNOSIS — Z86711 Personal history of pulmonary embolism: Secondary | ICD-10-CM | POA: Diagnosis not present

## 2022-07-22 DIAGNOSIS — Z79899 Other long term (current) drug therapy: Secondary | ICD-10-CM | POA: Diagnosis not present

## 2022-07-22 DIAGNOSIS — Z96641 Presence of right artificial hip joint: Secondary | ICD-10-CM | POA: Diagnosis not present

## 2022-07-22 DIAGNOSIS — M1712 Unilateral primary osteoarthritis, left knee: Secondary | ICD-10-CM | POA: Diagnosis not present

## 2022-07-22 DIAGNOSIS — Z96652 Presence of left artificial knee joint: Secondary | ICD-10-CM | POA: Diagnosis not present

## 2022-07-22 LAB — CBC
HCT: 33.2 % — ABNORMAL LOW (ref 36.0–46.0)
Hemoglobin: 10.7 g/dL — ABNORMAL LOW (ref 12.0–15.0)
MCH: 31.1 pg (ref 26.0–34.0)
MCHC: 32.2 g/dL (ref 30.0–36.0)
MCV: 96.5 fL (ref 80.0–100.0)
Platelets: 193 10*3/uL (ref 150–400)
RBC: 3.44 MIL/uL — ABNORMAL LOW (ref 3.87–5.11)
RDW: 15.1 % (ref 11.5–15.5)
WBC: 7.9 10*3/uL (ref 4.0–10.5)
nRBC: 0 % (ref 0.0–0.2)

## 2022-07-22 MED ORDER — ONDANSETRON HCL 4 MG PO TABS: 4.0000 mg | ORAL_TABLET | Freq: Three times a day (TID) | ORAL | 0 refills | Status: DC | PRN

## 2022-07-22 MED ORDER — SENNA-DOCUSATE SODIUM 8.6-50 MG PO TABS: 2.0000 | ORAL_TABLET | Freq: Every day | ORAL | 1 refills | Status: DC

## 2022-07-22 MED ORDER — RIVAROXABAN 10 MG PO TABS: 10.0000 mg | ORAL_TABLET | Freq: Every day | ORAL | 0 refills | Status: DC

## 2022-07-22 MED ORDER — ZOLPIDEM TARTRATE 5 MG PO TABS: 5.0000 mg | ORAL_TABLET | Freq: Every evening | ORAL | 0 refills | Status: DC | PRN

## 2022-07-22 MED ORDER — ASPIRIN 81 MG PO TBEC: 81.0000 mg | DELAYED_RELEASE_TABLET | Freq: Two times a day (BID) | ORAL | 0 refills | Status: DC

## 2022-07-22 MED ORDER — OXYCODONE HCL 5 MG PO TABS: 5.0000 mg | ORAL_TABLET | ORAL | 0 refills | Status: DC | PRN

## 2022-07-22 NOTE — Plan of Care (Signed)
Required extra time and assistance to use BSC this shift.

## 2022-07-22 NOTE — Progress Notes (Signed)
Physical Therapy Treatment Patient Details Name: FIDELIS LOTH MRN: [id number] DOB: 04-25-1950 Today's Date: 07/22/2022   History of Present Illness Danelia Snodgrass is a 72 yo female s/p L TKA 07/20/22. PMH: HTN, ovarian cancer, peripheral neuropathy, pulmonary embolism, R THA 08/26/20    PT Comments  Pt progressing well this session; pt requires assist with all functional mobility tasks at baseline, has good home support and feels ready to d/c home today      Assistance Recommended at Discharge Frequent or constant Supervision/Assistance  If plan is discharge home, recommend the following:  Can travel by private vehicle    Assistance with cooking/housework;Assist for transportation;Help with stairs or ramp for entrance;A little help with walking and/or transfers;A little help with bathing/dressing/bathroom      Equipment Recommendations  None recommended by PT    Recommendations for Other Services       Precautions / Restrictions Precautions Precautions: Fall;Knee Restrictions Weight Bearing Restrictions: No LLE Weight Bearing: Weight bearing as tolerated     Mobility  Bed Mobility Overal bed mobility: Needs Assistance Bed Mobility: Supine to Sit, Sit to Supine     Supine to sit: Min assist Sit to supine: Mod assist   General bed mobility comments: increased time, min A for LLE mobilization to EOB and trunk assist with pt using bedrail, increased time and effort    Transfers Overall transfer level: Needs assistance Equipment used: Rolling walker (2 wheels) Transfers: Sit to/from Stand, Bed to chair/wheelchair/BSC Sit to Stand: Min assist, Mod assist   Step pivot transfers: Min assist       General transfer comment: assist to rise and stabilize, cues for hand placement; bed to [facility] with min assist to steady and min assist to manipulate LB garment, dependent hygiene (husband assists at home)    Ambulation/Gait Ambulation/Gait assistance: Min assist Gait Distance  (Feet): 60 Feet Assistive device: Rolling walker (2 wheels) Gait Pattern/deviations: Step-to pattern, Decreased stride length, Decreased weight shift to left       General Gait Details: step to gait pattern with decreased but improved  LLE weightbearing and stance time; min A to min G, no knee buckling noted,  with chair follow for safety   Stairs Stairs: Yes Stairs assistance: Min assist Stair Management: One rail Left, Step to pattern, Sideways Number of Stairs: 3 General stair comments: cues for sequence, min assist to steady   Wheelchair Mobility     Tilt Bed    Modified Rankin (Stroke Patients Only)       Balance     Sitting balance-Leahy Scale: Good     Standing balance support: Reliant on assistive device for balance, During functional activity, Bilateral upper extremity supported Standing balance-Leahy Scale: Poor                              Cognition Arousal/Alertness: Awake/alert Behavior During Therapy: WFL for tasks assessed/performed Overall Cognitive Status: Within Functional Limits for tasks assessed                                          Exercises      General Comments        Pertinent Vitals/Pain Pain Assessment Pain Assessment: 0-10 Pain Score: 5  Pain Location: left knee Pain Descriptors / Indicators: Discomfort, Grimacing, Guarding Pain Intervention(s): Limited activity within patient's tolerance, Premedicated  before session, Monitored during session, Ice applied    Home Living                          Prior Function            PT Goals (current goals can now be found in the care plan section) Acute Rehab PT Goals Patient Stated Goal: return home PT Goal Formulation: With patient/family Time For Goal Achievement: 08/04/22 Potential to Achieve Goals: Good Progress towards PT goals: Progressing toward goals    Frequency    7X/week      PT Plan Current plan remains appropriate     Co-evaluation              AM-PAC PT "6 Clicks" Mobility   Outcome Measure  Help needed turning from your back to your side while in a flat bed without using bedrails?: A Little Help needed moving from lying on your back to sitting on the side of a flat bed without using bedrails?: A Little Help needed moving to and from a bed to a chair (including a wheelchair)?: A Little Help needed standing up from a chair using your arms (e.g., wheelchair or bedside chair)?: A Little Help needed to walk in hospital room?: A Little Help needed climbing 3-5 steps with a railing? : A Little 6 Click Score: 18    End of Session Equipment Utilized During Treatment: Gait belt Activity Tolerance: Patient tolerated treatment well Patient left: in bed;with call bell/phone within reach;with bed alarm set Nurse Communication: Mobility status PT Visit Diagnosis: Other abnormalities of gait and mobility (R26.89);Muscle weakness (generalized) (M62.81);Pain Pain - Right/Left: Left Pain - part of body: Knee     Time: 1031-1105 PT Time Calculation (min) (ACUTE ONLY): 34 min  Charges:    $Gait Training: 23-37 mins $Therapeutic Activity: 8-22 mins PT General Charges $$ ACUTE PT VISIT: 1 Visit                     Rosaleen Mazer, PT  Acute Rehab Dept [facility]) [phone]  07/22/2022    [facility] 07/22/2022, 11:25 AM

## 2022-07-22 NOTE — Progress Notes (Addendum)
   ORTHOPAEDIC PROGRESS NOTE  s/p Procedure(s): TOTAL KNEE ARTHROPLASTY on 07/20/2022 with Dr. Eulah Pont  SUBJECTIVE: Reports moderate/severe pain this morning but able to get a good night's sleep. Hoping to discharge home today.   No chest pain. No SOB. No nausea/vomiting. No other complaints.  OBJECTIVE: PE: General: sitting up in hospital bed, NAD Cardiac: regular rate Pulmonary: no increased work of breathing LLE: dressing CDI, removed ace wrap as it was starting to dig into her thigh,  intact EHL/TA/GSC, does have some numbness at the top of her foot but likely related to neuropathy, compartments soft and compressible, 2+ PT pulse  Vitals:   07/21/22 2142 07/22/22 0559  BP: 118/61 136/70  Pulse: 63 76  Resp: 18 18  Temp: (!) 97.5 F (36.4 C) 97.8 F (36.6 C)  SpO2: 92% 96%   Stable post-op images.   ASSESSMENT: Sarah Walker is a 72 y.o. female POD#2  PLAN: Weightbearing: WBAT LLE Insicional and dressing care: Reinforce dressings as needed. Ted hose on during the day, ok to remove at night Orthopedic device(s): None Showering: Post-op day #3 with assistance VTE prophylaxis: Aspirin 81 mg BID, SCDs, ambulation Pain control: PRN pain medications, minimize narcotics as able Follow - up plan: 2 weeks in office Dispo: Hopefully today. Once patient passes PT and complete stair training, patient to discharge home. Daughter had asked about HHPT, discussed with Dr. Eulah Pont yesterday and I discussed with patient this morning that we continue to recommend OPPT. Continue plan for OPPT.   Contact information:  Weekdays 8-5 Dr. Renaye Rakers, Janine Ores PA-C, After hours and holidays please check [email or web address] for group call information for [facility]  Janine Ores, Cordelia Poche 07/22/2022

## 2022-07-25 NOTE — Discharge Summary (Signed)
Discharge Summary  Patient ID: Sarah Walker MRN: [id number] DOB/AGE: 1950-07-21 72 y.o.  Admit date: 07/20/2022 Discharge date: 07/22/2022  Admission Diagnoses:  Status post left knee replacement  Discharge Diagnoses:  Principal Problem:   Status post left knee replacement Active Problems:   S/P TKR (total knee replacement)   Past Medical History:  Diagnosis Date   Abdominal hernia    Allergic rhinitis    Arthritis    Bone spur of other site    left foot   Dry eyes    Family history of kidney cancer    Family history of prostate cancer    Hypertension    Ovarian cancer (HCC)    Ovarian cancer on right (HCC) 06/17/2011   Peripheral neuropathy 08/26/2011   Chemotherapy-induced   Pulmonary embolism (HCC)    Sinusitis     Surgeries: Procedure(s): TOTAL KNEE ARTHROPLASTY on 07/20/2022   Consultants (if any):   Discharged Condition: Improved  Hospital Course: Sarah Walker is an 72 y.o. female who was admitted 07/20/2022 with a diagnosis of Status post left knee replacement and went to the operating room on 07/20/2022 and underwent the above named procedures.    She was given perioperative antibiotics:  Anti-infectives (From admission, onward)    Start     Dose/Rate Route Frequency Ordered Stop   07/20/22 1830  ceFAZolin (ANCEF) IVPB 2g/100 mL premix        2 g 200 mL/hr over 30 Minutes Intravenous Every 6 hours 07/20/22 1620 07/21/22 0300   07/20/22 1050  ceFAZolin (ANCEF) 3-0.9 GM/100ML-% IVPB       Note to Pharmacy: [facility]: cabinet override      07/20/22 1050 07/20/22 1235   07/20/22 1045  ceFAZolin (ANCEF) IVPB 3g/100 mL premix        3 g 200 mL/hr over 30 Minutes Intravenous On call to O.R. 07/20/22 1043 07/20/22 1243     .  She was given sequential compression devices, early ambulation, and Xarelto for DVT prophylaxis.  She benefited maximally from the hospital stay and there were no complications.    Recent vital signs:  Vitals:    07/21/22 2142 07/22/22 0559  BP: 118/61 136/70  Pulse: 63 76  Resp: 18 18  Temp: (!) 97.5 F (36.4 C) 97.8 F (36.6 C)  SpO2: 92% 96%    Recent laboratory studies:  Lab Results  Component Value Date   HGB 10.7 (L) 07/22/2022   HGB 12.5 07/14/2022   HGB 10.2 (L) 12/14/2021   Lab Results  Component Value Date   WBC 7.9 07/22/2022   PLT 193 07/22/2022   Lab Results  Component Value Date   INR 1.0 12/13/2021   Lab Results  Component Value Date   NA 137 07/14/2022   K 4.0 07/14/2022   CL 104 07/14/2022   CO2 23 07/14/2022   BUN 27 (H) 07/14/2022   CREATININE 1.16 (H) 07/14/2022   GLUCOSE 94 07/14/2022    Discharge Medications:   Allergies as of 07/22/2022       Reactions   Codeine Nausea And Vomiting   Not right away after a couple days of taking   Erythromycin Itching, Other (See Comments)        Medication List     STOP taking these medications    aspirin EC 81 MG tablet       TAKE these medications    acetaminophen 500 MG tablet Commonly known as: TYLENOL Take 1,000 mg by mouth every  8 (eight) hours as needed for moderate pain.   acitretin 25 MG capsule Commonly known as: SORIATANE Take 25 mg by mouth daily.   azelastine 0.05 % ophthalmic solution Commonly known as: OPTIVAR Place 1 drop into both eyes daily as needed (rag weed).   bismuth subsalicylate 262 MG/15ML suspension Commonly known as: PEPTO BISMOL Take 30 mLs by mouth every 6 (six) hours as needed for indigestion.   fexofenadine 180 MG tablet Commonly known as: ALLEGRA Take 180 mg by mouth daily as needed for allergies or rhinitis.   fluticasone 50 MCG/ACT nasal spray Commonly known as: FLONASE Place 1 spray into both nostrils daily as needed for allergies or rhinitis.   furosemide 40 MG tablet Commonly known as: LASIX Take 40 mg by mouth daily as needed for edema.   Klor-Con M20 20 MEQ tablet Generic drug: potassium chloride SA TAKE 1 TABLET BY MOUTH EVERY DAY What  changed: how much to take   meloxicam 15 MG tablet Commonly known as: MOBIC Take 15 mg by mouth daily as needed for pain. PLEASE SEE ATTACHED FOR DETAILED DIRECTIONS   OMEGA-3 COMPLEX PO Take 3 capsules by mouth daily. Omega xl   ondansetron 4 MG tablet Commonly known as: Zofran Take 1 tablet (4 mg total) by mouth every 8 (eight) hours as needed for nausea or vomiting.   oxyCODONE 5 MG immediate release tablet Commonly known as: Roxicodone Take 1 tablet (5 mg total) by mouth every 4 (four) hours as needed for severe pain.   rivaroxaban 10 MG Tabs tablet Commonly known as: Xarelto Take 1 tablet (10 mg total) by mouth daily.   sennosides-docusate sodium 8.6-50 MG tablet Commonly known as: SENOKOT-S Take 2 tablets by mouth daily.   valsartan-hydrochlorothiazide 320-12.5 MG tablet Commonly known as: DIOVAN-HCT Take 1 tablet by mouth daily.   zolpidem 5 MG tablet Commonly known as: AMBIEN Take 1 tablet (5 mg total) by mouth at bedtime as needed for sleep.        Diagnostic Studies: DG Knee Left Port  Result Date: 07/20/2022 CLINICAL DATA:  Postoperative check.  Post knee replacement. EXAM: PORTABLE LEFT KNEE - 1-2 VIEW COMPARISON:  None Available. FINDINGS: Postoperative changes with left total knee arthroplasty. A patellar femoral component is present. Components appear well seated although rotation in the lateral view limits evaluation. No evidence of acute fracture or dislocation. Soft tissue gas is consistent with recent surgery. IMPRESSION: Postoperative left total knee arthroplasty. Components appear in satisfactory position although patient rotation on the lateral view limits evaluation. Electronically Signed   By: Burman Nieves M.D.   On: 07/20/2022 18:45    Disposition: Discharge disposition: 01-Home or Self Care          Follow-up Information     Sheral Apley, MD. Go on 08/04/2022.   Specialty: Orthopedic Surgery Why: Your appointment is scheduled for  4:15. Contact information: [address] [place] [address] [phone]         [facility] OPPT- [facility]. Go on 07/22/2022.   Why: THey will call you with a time Contact information: [phone]                 Signed: Armida Sans PA-C 07/25/2022, 12:56 PM

## 2022-07-29 DIAGNOSIS — Z96659 Presence of unspecified artificial knee joint: Secondary | ICD-10-CM | POA: Diagnosis not present

## 2022-07-30 ENCOUNTER — Other Ambulatory Visit: Payer: Self-pay

## 2022-07-30 ENCOUNTER — Ambulatory Visit (HOSPITAL_COMMUNITY): Payer: Medicare Other | Attending: Family Medicine | Admitting: Physical Therapy

## 2022-07-30 DIAGNOSIS — M6281 Muscle weakness (generalized): Secondary | ICD-10-CM | POA: Diagnosis not present

## 2022-07-30 DIAGNOSIS — M25662 Stiffness of left knee, not elsewhere classified: Secondary | ICD-10-CM | POA: Diagnosis not present

## 2022-07-30 DIAGNOSIS — M25562 Pain in left knee: Secondary | ICD-10-CM

## 2022-07-30 NOTE — Therapy (Signed)
OUTPATIENT PHYSICAL THERAPY LOWER EXTREMITY EVALUATION   Patient Name: Sarah Walker MRN: [id number] DOB:Mar 31, 1950, 72 y.o., female Today's Date: 07/30/2022  END OF SESSION:  PT End of Session - 07/30/22 1114     Visit Number 1    Number of Visits 12    Date for PT Re-Evaluation 09/10/22    Authorization Type [facility] medicare    Authorization - Visit Number 1    Authorization - Number of Visits 12    Progress Note Due on Visit 10    PT Start Time 1030    PT Stop Time 1110    PT Time Calculation (min) 40 min    Activity Tolerance Patient tolerated treatment well    Behavior During Therapy WFL for tasks assessed/performed             Past Medical History:  Diagnosis Date   Abdominal hernia    Allergic rhinitis    Arthritis    Bone spur of other site    left foot   Dry eyes    Family history of kidney cancer    Family history of prostate cancer    Hypertension    Ovarian cancer (HCC)    Ovarian cancer on right (HCC) 06/17/2011   Peripheral neuropathy 08/26/2011   Chemotherapy-induced   Pulmonary embolism (HCC)    Sinusitis    Past Surgical History:  Procedure Laterality Date   ABDOMINAL HYSTERECTOMY  05/25/2011   [place] bs&o and cancer staging   CHOLECYSTECTOMY  1990   COLONOSCOPY N/A 09/06/2014   Procedure: COLONOSCOPY;  Surgeon: Malissa Hippo, MD;  [facility];  Service: Endoscopy;  Laterality: N/A;  930 - moved to 9/2 @ 8:25   I & D EXTREMITY Left 12/13/2021   Procedure: IRRIGATION AND DEBRIDEMENT ANKLE/ LEG;  Surgeon: Sheral Apley, MD;  Location: [facility] OR;  Service: Orthopedics;  Laterality: Left;   INCISIONAL HERNIA REPAIR  10/15/2014   INCISIONAL HERNIA REPAIR  10/15/2014   Procedure: OPEN INCISIONAL HERNIA REPAIR WITH MESH AND MYOFASCIAL FLAPS;  Surgeon: Harriette Bouillon, MD;  Location: [facility] OR;  Service: General;;   IVC filter  05/24/11   PORT-A-CATH REMOVAL Right 11/21/2013   Procedure: MINOR REMOVAL PORT-A-CATH;  Surgeon: Dalia Heading, MD;   Location: [facility] ORS;  Service: General;  Laterality: Right;   PORTACATH PLACEMENT  06/16/11   Done at [facility]   TONSILLECTOMY     age 14's   TOTAL HIP ARTHROPLASTY Right 08/26/2020   Procedure: TOTAL HIP ARTHROPLASTY ANTERIOR APPROACH;  Surgeon: Sheral Apley, MD;  Location: [facility] ORS;  Service: Orthopedics;  Laterality: Right;   TOTAL KNEE ARTHROPLASTY Left 07/20/2022   Procedure: TOTAL KNEE ARTHROPLASTY;  Surgeon: Sheral Apley, MD;  Location: [facility] ORS;  Service: Orthopedics;  Laterality: Left;   TUBAL LIGATION  1981   Patient Active Problem List   Diagnosis Date Noted   S/P TKR (total knee replacement) 07/21/2022   Status post left knee replacement 07/20/2022   Leg abscess 12/13/2021   Abscess of left leg 12/12/2021   Cellulitis 12/12/2021   Eczema 12/12/2021   Elevated serum creatinine 12/12/2021   S/P total right hip arthroplasty 08/26/2020   Vaginal atrophy 03/02/2017   History of ovarian cancer 03/02/2017   Chemotherapy-induced neuropathy (HCC) 03/31/2015   Incisional hernia, incarcerated 10/15/2014   Ventral hernia 07/15/2014   Ovarian cancer on right [facility]) 06/17/2011   Hypertension 06/17/2011   H/O hysterectomy with oophorectomy 06/17/2011    PCP: Dr. Jonny Ruiz  [facility]   REFERRING PROVIDER: Sheral Apley, MD  REFERRING DIAG:  Free Text Diagnosis  s/p L total knee replacement    THERAPY DIAG:  Pain in left knee Stiffness in left knee Edema Muscle weakness  Rationale for Evaluation and Treatment: Rehabilitation  ONSET DATE: 07/20/2022    SUBJECTIVE STATEMENT: Pt states that the pain is awful and she is having trouble getting up from a chair and bed.   PERTINENT HISTORY: As above  PAIN:  Are you having pain? Yes: NPRS scale: 8/10; worst 10 best 0 (with ice and elevated.  Pain location: Lt knee  Pain description: throbbing  Aggravating factors: activity Relieving factors: elevation   PRECAUTIONS: None     WEIGHT BEARING RESTRICTIONS: Yes  WBAT  FALLS:  Has patient fallen in last 6 months? No  LIVING ENVIRONMENT: Lives with: lives with their family Lives in: House/apartment Stairs: Yes: Internal: 4 steps; on right going up and External: 2 steps; on right going up Has following equipment at home: Single point cane and Walker - 2 wheeled  OCCUPATION: retired  PLOF: Independent  PATIENT GOALS: less pain, more mobility  NEXT MD VISIT: 08/04/22  OBJECTIVE:    PATIENT SURVEYS:  FOTO 35    EDEMA:  normal for post op   LOWER EXTREMITY ROM:  Active ROM Right eval Left eval  Hip flexion    Hip extension    Hip abduction    Hip adduction    Hip internal rotation    Hip external rotation     Knee flexion  73   Knee extension  0  Ankle dorsiflexion    Ankle plantarflexion    Ankle inversion    Ankle eversion     (Blank rows = not tested)  LOWER EXTREMITY MMT:  MMT Right eval Left eval  Hip flexion    Hip extension    Hip abduction    Hip adduction    Hip internal rotation    Hip external rotation    Knee flexion  3+  Knee extension  3+  Ankle dorsiflexion  3+  Ankle plantarflexion    Ankle inversion    Ankle eversion     (Blank rows = not tested) FUNCTIONAL TESTS:  30 seconds chair stand test: needs UE very slow to painful at this time  2 minute walk test: with walker 72 ft Single leg stance:unable B     TODAY'S TREATMENT:                                                                                                                              DATE: 07/30/22 Evaluation   Seated Long Arc Quad 5reps - 3-5" hold - Seated Transversus Abdominis Bracing  -  10 reps - 3-5" hold - Supine Ankle Pumps  10 reps - 3-5" hold - Supine Gluteal Sets 10 reps - 3-5" hold - Supine Heel Slide  - 5 reps - 3-5" hold  PATIENT  EDUCATION:  Education details: HEP Person educated: Patient Education method: Medical illustrator Education comprehension: returned demonstration  HOME EXERCISE  PROGRAM: Access Code: [email or web address] URL: [email or web address] Date: 07/30/2022 Prepared by: Virgina Organ  Exercises - Seated Long Arc Quad  - 1 x daily - 7 x weekly - 3 sets - 10 reps - 3-5" hold - Seated Transversus Abdominis Bracing  - 1 x daily - 7 x weekly - 3 sets - 10 reps - 3-5" hold - Supine Ankle Pumps  - 1 x daily - 7 x weekly - 3 sets - 10 reps - 3-5" hold - Supine Gluteal Sets  - 1 x daily - 7 x weekly - 3 sets - 10 reps - 3-5" hold - Supine Heel Slide  - 1 x daily - 7 x weekly - 3 sets - 10 reps - 3-5" hold  ASSESSMENT:  CLINICAL IMPRESSION: Patient is a 72 y.o. female  who was seen today for physical therapy evaluation and treatment for Lt Total knee replacement.  Evaluation demonstrates increased pain, increased edema, decreased mobility, decreased activity tolerance, decreased ROM, decreased balance and decreased strength.  Ms. Bonifield will benefit from skilled PT to address these issues and maximize her functional ability.   OBJECTIVE IMPAIRMENTS: decreased activity tolerance, decreased balance, decreased mobility, difficulty walking, decreased ROM, decreased strength, and pain.   ACTIVITY LIMITATIONS: carrying, lifting, bending, sitting, standing, squatting, sleeping, stairs, dressing, and locomotion level  PARTICIPATION LIMITATIONS: cleaning, laundry, driving, shopping, community activity, and yard work  PERSONAL FACTORS: Fitness are also affecting patient's functional outcome.   REHAB POTENTIAL: Good  CLINICAL DECISION MAKING: Stable/uncomplicated  EVALUATION COMPLEXITY: Low   GOALS: Goals reviewed with patient? No  SHORT TERM GOALS: Target date: 08/20/22 Pt to be I in HEP in order to decrease her pain to no greater than a 5 Baseline: Goal status: INITIAL  2.  Pt ROM to be increased to  90 to allow improved comfort of sitting to be able to sit for 30 mintues  Baseline:  Goal status: INITIAL  3.  PT strength to be increased by one grade to  allow pt to be ambulating with a cane.in the home Baseline:  Goal status: INITIAL   LONG TERM GOALS: Target date: 09/10/22  Pt to be I in HEP in order to decrease her pain to no greater than a 2 Baseline:  Goal status: INITIAL  2.  Pt ROM to be increased to   120    to allow improved ability to squat to pick items off the floor  Baseline:  Goal status: INITIAL  3.  PT strength to be increased by one grade to allow pt to be ambulating with a cane in the community Baseline:  Goal status: INITIAL  4.  Pt to be able to go up and down 4 steps in a reciprocal manner to ger up to her bedroom  Baseline:  Goal status: INITIAL     PLAN:  PT FREQUENCY: 2x/week  PT DURATION: 6 weeks  PLANNED INTERVENTIONS: Therapeutic exercises, Balance training, Gait training, Patient/Family education, Self Care, and Manual therapy  PLAN FOR NEXT SESSION: begin stair training on small steps as pt needs to go up and down 4 steps to get to her bedroom, progress ROM and strength as able   Virgina Organ, PT CLT [phone]  07/30/2022, 11:21 AM

## 2022-08-03 ENCOUNTER — Ambulatory Visit (HOSPITAL_COMMUNITY): Payer: Medicare Other | Admitting: Physical Therapy

## 2022-08-03 DIAGNOSIS — M25662 Stiffness of left knee, not elsewhere classified: Secondary | ICD-10-CM | POA: Diagnosis not present

## 2022-08-03 DIAGNOSIS — M25562 Pain in left knee: Secondary | ICD-10-CM | POA: Diagnosis not present

## 2022-08-03 DIAGNOSIS — M6281 Muscle weakness (generalized): Secondary | ICD-10-CM | POA: Diagnosis not present

## 2022-08-03 NOTE — Therapy (Signed)
OUTPATIENT PHYSICAL THERAPY TREATMENT   Patient Name: Sarah Walker MRN: [id number] DOB:08/06/1950, 72 y.o., female Today's Date: 08/03/2022  END OF SESSION:  PT End of Session - 08/03/22 1137     Visit Number 2    Number of Visits 12    Date for PT Re-Evaluation 09/10/22    Authorization Type [facility] medicare    Authorization - Number of Visits 12    Progress Note Due on Visit 10    PT Start Time 1128    PT Stop Time 1200    PT Time Calculation (min) 32 min    Activity Tolerance Patient tolerated treatment well    Behavior During Therapy WFL for tasks assessed/performed             Past Medical History:  Diagnosis Date   Abdominal hernia    Allergic rhinitis    Arthritis    Bone spur of other site    left foot   Dry eyes    Family history of kidney cancer    Family history of prostate cancer    Hypertension    Ovarian cancer (HCC)    Ovarian cancer on right (HCC) 06/17/2011   Peripheral neuropathy 08/26/2011   Chemotherapy-induced   Pulmonary embolism (HCC)    Sinusitis    Past Surgical History:  Procedure Laterality Date   ABDOMINAL HYSTERECTOMY  05/25/2011   [place] bs&o and cancer staging   CHOLECYSTECTOMY  1990   COLONOSCOPY N/A 09/06/2014   Procedure: COLONOSCOPY;  Surgeon: Malissa Hippo, MD;  [facility];  Service: Endoscopy;  Laterality: N/A;  930 - moved to 9/2 @ 8:25   I & D EXTREMITY Left 12/13/2021   Procedure: IRRIGATION AND DEBRIDEMENT ANKLE/ LEG;  Surgeon: Sheral Apley, MD;  Location: [facility] OR;  Service: Orthopedics;  Laterality: Left;   INCISIONAL HERNIA REPAIR  10/15/2014   INCISIONAL HERNIA REPAIR  10/15/2014   Procedure: OPEN INCISIONAL HERNIA REPAIR WITH MESH AND MYOFASCIAL FLAPS;  Surgeon: Harriette Bouillon, MD;  Location: [facility] OR;  Service: General;;   IVC filter  05/24/11   PORT-A-CATH REMOVAL Right 11/21/2013   Procedure: MINOR REMOVAL PORT-A-CATH;  Surgeon: Dalia Heading, MD;  Location: [facility];  Service: General;  Laterality: Right;    PORTACATH PLACEMENT  06/16/11   Done at [facility]   TONSILLECTOMY     age 37's   TOTAL HIP ARTHROPLASTY Right 08/26/2020   Procedure: TOTAL HIP ARTHROPLASTY ANTERIOR APPROACH;  Surgeon: Sheral Apley, MD;  Location: [facility]S;  Service: Orthopedics;  Laterality: Right;   TOTAL KNEE ARTHROPLASTY Left 07/20/2022   Procedure: TOTAL KNEE ARTHROPLASTY;  Surgeon: Sheral Apley, MD;  Location: [facility];  Service: Orthopedics;  Laterality: Left;   TUBAL LIGATION  1981   Patient Active Problem List   Diagnosis Date Noted   S/P TKR (total knee replacement) 07/21/2022   Status post left knee replacement 07/20/2022   Leg abscess 12/13/2021   Abscess of left leg 12/12/2021   Cellulitis 12/12/2021   Eczema 12/12/2021   Elevated serum creatinine 12/12/2021   S/P total right hip arthroplasty 08/26/2020   Vaginal atrophy 03/02/2017   History of ovarian cancer 03/02/2017   Chemotherapy-induced neuropathy (HCC) 03/31/2015   Incisional hernia, incarcerated 10/15/2014   Ventral hernia 07/15/2014   Ovarian cancer on right (HCC) 06/17/2011   Hypertension 06/17/2011   H/O hysterectomy with oophorectomy 06/17/2011    PCP: Dr. Assunta Found   REFERRING PROVIDER: Sheral Apley, MD  REFERRING DIAG:  Free Text Diagnosis  s/p L total knee replacement    THERAPY DIAG:  Pain in left knee Stiffness in left knee Edema Muscle weakness  Rationale for Evaluation and Treatment: Rehabilitation  ONSET DATE: 07/20/2022  SUBJECTIVE STATEMENT: Pt late for appt today.  States she is not using her CPM because is hurts.  Limited exercises completed due to pain/stiffness.  Able to get up and down her steps at home with handrails.  Reports pain is 6/10 currently.  Unable to get out of bed without using her UE and then has difficulty.     PERTINENT HISTORY: As above  PAIN:  Are you having pain? Yes: NPRS scale: 8/10; worst 10 best 0 (with ice and elevated.  Pain location: Lt knee  Pain  description: throbbing  Aggravating factors: activity Relieving factors: elevation   PRECAUTIONS: None     WEIGHT BEARING RESTRICTIONS: Yes WBAT  FALLS:  Has patient fallen in last 6 months? No  LIVING ENVIRONMENT: Lives with: lives with their family Lives in: House/apartment Stairs: Yes: Internal: 4 steps; on right going up and External: 2 steps; on right going up Has following equipment at home: Single point cane and Walker - 2 wheeled  OCCUPATION: retired  PLOF: Independent  PATIENT GOALS: less pain, more mobility  NEXT MD VISIT: 08/04/22  OBJECTIVE:    PATIENT SURVEYS:  FOTO 35  EDEMA:  normal for post op   LOWER EXTREMITY ROM:  Active ROM Right eval Left eval  Hip flexion    Hip extension    Hip abduction    Hip adduction    Hip internal rotation    Hip external rotation     Knee flexion  73   Knee extension  0  Ankle dorsiflexion    Ankle plantarflexion    Ankle inversion    Ankle eversion     (Blank rows = not tested)  LOWER EXTREMITY MMT:  MMT Right eval Left eval  Hip flexion    Hip extension    Hip abduction    Hip adduction    Hip internal rotation    Hip external rotation    Knee flexion  3+  Knee extension  3+  Ankle dorsiflexion  3+  Ankle plantarflexion    Ankle inversion    Ankle eversion     (Blank rows = not tested) FUNCTIONAL TESTS:  30 seconds chair stand test: needs UE very slow to painful at this time  2 minute walk test: with walker 72 ft Single leg stance:unable B    TODAY'S TREATMENT:                                                                                                                              DATE:  08/03/22 Goal review HEP review (ab bracing, GS, AP) Supine:  heel slides 10X5"  AROM 0-82  SLR Lt 10X Manual MFR soft tissue to lateral knee, edema below Seated:  LAQ  Heel slides  Self stretch using RT LE and rocking chair/swing instructions for home for flexion Sit to stands, unable to  complete without UE assist  07/30/22 Evaluation   Seated Long Arc Quad 5reps - 3-5" hold - Seated Transversus Abdominis Bracing  -  10 reps - 3-5" hold - Supine Ankle Pumps  10 reps - 3-5" hold - Supine Gluteal Sets 10 reps - 3-5" hold - Supine Heel Slide  - 5 reps - 3-5" hold  PATIENT EDUCATION:  Education details: HEP Person educated: Patient Education method: Medical illustrator Education comprehension: returned demonstration  HOME EXERCISE PROGRAM: Access Code: [email or web address] URL: [email or web address] Date: 07/30/2022 Prepared by: Virgina Organ  Exercises - Seated Long Arc Quad  - 1 x daily - 7 x weekly - 3 sets - 10 reps - 3-5" hold - Seated Transversus Abdominis Bracing  - 1 x daily - 7 x weekly - 3 sets - 10 reps - 3-5" hold - Supine Ankle Pumps  - 1 x daily - 7 x weekly - 3 sets - 10 reps - 3-5" hold - Supine Gluteal Sets  - 1 x daily - 7 x weekly - 3 sets - 10 reps - 3-5" hold - Supine Heel Slide  - 1 x daily - 7 x weekly - 3 sets - 10 reps - 3-5" hold  ASSESSMENT:  CLINICAL IMPRESSION: Reviewed goals and POC moving forward.  Pt with visible difficulty getting out of chair.  Tends to rely on CG and pull instead of push.  Encouraged to work on this at home, pushing up and progressing to ability to complete without UE assist.  Pt with noted resting position of Lt LE into ER.  Encouraged to work on this as well, working her LE more to improving her ROM and strength.  Pt admits to not doing her exercises or working on these at home.  Pt does return to MD tomorrow and instructed to discuss use of CPM with him as he had ordered it for home.  AROM slightly improved following manual to 82 degrees flexion.  Pt with edema and fascial restrictions lateral knee.  Treatment limited today due to late arrival.  Pt will continue to benefit from skilled PT to address deficits and maximize functional ability.   Evaluation: Patient is a 72 y.o. female  who was seen today  for physical therapy evaluation and treatment for Lt Total knee replacement.  Evaluation demonstrates increased pain, increased edema, decreased mobility, decreased activity tolerance, decreased ROM, decreased balance and decreased strength.  Pt will continue to benefit from skilled PT to address these issues and maximize her functional ability.   OBJECTIVE IMPAIRMENTS: decreased activity tolerance, decreased balance, decreased mobility, difficulty walking, decreased ROM, decreased strength, and pain.   ACTIVITY LIMITATIONS: carrying, lifting, bending, sitting, standing, squatting, sleeping, stairs, dressing, and locomotion level  PARTICIPATION LIMITATIONS: cleaning, laundry, driving, shopping, community activity, and yard work  PERSONAL FACTORS: Fitness are also affecting patient's functional outcome.   REHAB POTENTIAL: Good  CLINICAL DECISION MAKING: Stable/uncomplicated  EVALUATION COMPLEXITY: Low   GOALS: Goals reviewed with patient? No  SHORT TERM GOALS: Target date: 08/20/22 Pt to be I in HEP in order to decrease her pain to no greater than a 5 Baseline: Goal status: INITIAL  2.  Pt ROM to be increased to  90 to allow improved comfort of sitting to be able to sit for 30 mintues  Baseline:  Goal status: INITIAL  3.  PT strength to be  increased by one grade to allow pt to be ambulating with a cane.in the home Baseline:  Goal status: INITIAL   LONG TERM GOALS: Target date: 09/10/22  Pt to be I in HEP in order to decrease her pain to no greater than a 2 Baseline:  Goal status: INITIAL  2.  Pt ROM to be increased to   120    to allow improved ability to squat to pick items off the floor  Baseline:  Goal status: INITIAL  3.  PT strength to be increased by one grade to allow pt to be ambulating with a cane in the community Baseline:  Goal status: INITIAL  4.  Pt to be able to go up and down 4 steps in a reciprocal manner to ger up to her bedroom  Baseline:  Goal status:  INITIAL     PLAN:  PT FREQUENCY: 2x/week  PT DURATION: 6 weeks  PLANNED INTERVENTIONS: Therapeutic exercises, Balance training, Gait training, Patient/Family education, Self Care, and Manual therapy  PLAN FOR NEXT SESSION: Next session begin on bike for ROM.  Completed  stair training on lower steps to ensure safety as pt has to negotiate steps to get to her bedroom, progress ROM and strength as able   Lurena Nida, PTA/CLT [facility] Ph: [phone]   08/03/2022, 11:37 AM

## 2022-08-04 DIAGNOSIS — M1712 Unilateral primary osteoarthritis, left knee: Secondary | ICD-10-CM | POA: Diagnosis not present

## 2022-08-05 ENCOUNTER — Ambulatory Visit (HOSPITAL_COMMUNITY): Payer: Medicare Other | Attending: Family Medicine | Admitting: Physical Therapy

## 2022-08-05 DIAGNOSIS — M25662 Stiffness of left knee, not elsewhere classified: Secondary | ICD-10-CM | POA: Insufficient documentation

## 2022-08-05 DIAGNOSIS — M6281 Muscle weakness (generalized): Secondary | ICD-10-CM | POA: Diagnosis not present

## 2022-08-05 DIAGNOSIS — M25562 Pain in left knee: Secondary | ICD-10-CM | POA: Insufficient documentation

## 2022-08-05 NOTE — Therapy (Signed)
OUTPATIENT PHYSICAL THERAPY TREATMENT   Patient Name: Sarah Walker MRN: [id number] DOB:Feb 03, 1950, 72 y.o., female Today's Date: 08/05/2022  END OF SESSION:  PT End of Session - 08/05/22 0857     Visit Number 3    Number of Visits 12    Date for PT Re-Evaluation 09/10/22    Authorization Type [facility] medicare    Authorization - Number of Visits 12    Progress Note Due on Visit 10    PT Start Time 0820    PT Stop Time 0902    PT Time Calculation (min) 42 min    Activity Tolerance Patient tolerated treatment well    Behavior During Therapy WFL for tasks assessed/performed             Past Medical History:  Diagnosis Date   Abdominal hernia    Allergic rhinitis    Arthritis    Bone spur of other site    left foot   Dry eyes    Family history of kidney cancer    Family history of prostate cancer    Hypertension    Ovarian cancer (HCC)    Ovarian cancer on right (HCC) 06/17/2011   Peripheral neuropathy 08/26/2011   Chemotherapy-induced   Pulmonary embolism (HCC)    Sinusitis    Past Surgical History:  Procedure Laterality Date   ABDOMINAL HYSTERECTOMY  05/25/2011   [place] bs&o and cancer staging   CHOLECYSTECTOMY  1990   COLONOSCOPY N/A 09/06/2014   Procedure: COLONOSCOPY;  Surgeon: Malissa Hippo, MD;  [facility];  Service: Endoscopy;  Laterality: N/A;  930 - moved to 9/2 @ 8:25   I & D EXTREMITY Left 12/13/2021   Procedure: IRRIGATION AND DEBRIDEMENT ANKLE/ LEG;  Surgeon: Sheral Apley, MD;  Location: [facility];  Service: Orthopedics;  Laterality: Left;   INCISIONAL HERNIA REPAIR  10/15/2014   INCISIONAL HERNIA REPAIR  10/15/2014   Procedure: OPEN INCISIONAL HERNIA REPAIR WITH MESH AND MYOFASCIAL FLAPS;  Surgeon: Harriette Bouillon, MD;  Location: [facility] OR;  Service: General;;   IVC filter  05/24/11   PORT-A-CATH REMOVAL Right 11/21/2013   Procedure: MINOR REMOVAL PORT-A-CATH;  Surgeon: Dalia Heading, MD;  Location: [facility];  Service: General;  Laterality: Right;    PORTACATH PLACEMENT  06/16/11   Done at [facility]   TONSILLECTOMY     age 89's   TOTAL HIP ARTHROPLASTY Right 08/26/2020   Procedure: TOTAL HIP ARTHROPLASTY ANTERIOR APPROACH;  Surgeon: Sheral Apley, MD;  Location: [facility] ORS;  Service: Orthopedics;  Laterality: Right;   TOTAL KNEE ARTHROPLASTY Left 07/20/2022   Procedure: TOTAL KNEE ARTHROPLASTY;  Surgeon: Sheral Apley, MD;  Location: [facility];  Service: Orthopedics;  Laterality: Left;   TUBAL LIGATION  1981   Patient Active Problem List   Diagnosis Date Noted   S/P TKR (total knee replacement) 07/21/2022   Status post left knee replacement 07/20/2022   Leg abscess 12/13/2021   Abscess of left leg 12/12/2021   Cellulitis 12/12/2021   Eczema 12/12/2021   Elevated serum creatinine 12/12/2021   S/P total right hip arthroplasty 08/26/2020   Vaginal atrophy 03/02/2017   History of ovarian cancer 03/02/2017   Chemotherapy-induced neuropathy (HCC) 03/31/2015   Incisional hernia, incarcerated 10/15/2014   Ventral hernia 07/15/2014   Ovarian cancer on right (HCC) 06/17/2011   Hypertension 06/17/2011   H/O hysterectomy with oophorectomy 06/17/2011    PCP: Dr. Assunta Found   REFERRING PROVIDER: Sheral Apley, MD  REFERRING DIAG:  Free Text Diagnosis  s/p L total knee replacement    THERAPY DIAG:  Pain in left knee Stiffness in left knee Edema Muscle weakness  Rationale for Evaluation and Treatment: Rehabilitation  ONSET DATE: 07/20/2022  SUBJECTIVE STATEMENT: Noted challenge rising from chair in waiting room, pulling on walker and with CG hoisting up.  States appt went well with MD yesterday.  Reports she is hurting 8/10 today in her Lt knee.  Very slow mobilizing today.  Resumed using her CPM per MD request.     PERTINENT HISTORY: Lt TKA 07/21/22.  As above  PAIN:  Are you having pain? Yes: NPRS scale: 8/10; worst 10 best 0 (with ice and elevated.  Pain location: Lt knee  Pain description: throbbing   Aggravating factors: activity Relieving factors: elevation   PRECAUTIONS: None     WEIGHT BEARING RESTRICTIONS: Yes WBAT  FALLS:  Has patient fallen in last 6 months? No  LIVING ENVIRONMENT: Lives with: lives with their family Lives in: House/apartment Stairs: Yes: Internal: 4 steps; on right going up and External: 2 steps; on right going up Has following equipment at home: Single point cane and Walker - 2 wheeled  OCCUPATION: retired  PLOF: Independent  PATIENT GOALS: less pain, more mobility  NEXT MD VISIT: 08/04/22  OBJECTIVE:    PATIENT SURVEYS:  FOTO 35  EDEMA:  normal for post op   LOWER EXTREMITY ROM:  Active ROM Right eval Left eval  Hip flexion    Hip extension    Hip abduction    Hip adduction    Hip internal rotation    Hip external rotation     Knee flexion  73   Knee extension  0  Ankle dorsiflexion    Ankle plantarflexion    Ankle inversion    Ankle eversion     (Blank rows = not tested)  LOWER EXTREMITY MMT:  MMT Right eval Left eval  Hip flexion    Hip extension    Hip abduction    Hip adduction    Hip internal rotation    Hip external rotation    Knee flexion  3+  Knee extension  3+  Ankle dorsiflexion  3+  Ankle plantarflexion    Ankle inversion    Ankle eversion     (Blank rows = not tested) FUNCTIONAL TESTS:  30 seconds chair stand test: needs UE very slow to painful at this time  2 minute walk test: with walker 72 ft Single leg stance:unable B    TODAY'S TREATMENT:                                                                                                                              DATE:  08/05/22 Bike seat 6 rocking 5 minutes Standing:: heelraises 10X  Lt knee flexion stretch on 6" box 10X10"  Lt knee flexion 10X Sit to stands with bil UE 5X Supine:  heelslides 5X  Lt SLR 10X  SAQ 10X5" AROM Lt LE 84 degrees   08/03/22 Goal review HEP review (ab bracing, GS, AP) Supine:  heel slides 10X5"  AROM  0-82  SLR Lt 10X Manual MFR soft tissue to lateral knee, edema below Seated:  LAQ  Heel slides  Self stretch using RT LE and rocking chair/swing instructions for home for flexion Sit to stands, unable to complete without UE assist  07/30/22 Evaluation   Seated Long Arc Quad 5reps - 3-5" hold - Seated Transversus Abdominis Bracing  -  10 reps - 3-5" hold - Supine Ankle Pumps  10 reps - 3-5" hold - Supine Gluteal Sets 10 reps - 3-5" hold - Supine Heel Slide  - 5 reps - 3-5" hold  PATIENT EDUCATION:  Education details: HEP Person educated: Patient Education method: Medical illustrator Education comprehension: returned demonstration  HOME EXERCISE PROGRAM: Access Code: [email or web address] URL: [email or web address] Date: 07/30/2022 Prepared by: Virgina Organ  Exercises - Seated Long Arc Quad  - 1 x daily - 7 x weekly - 3 sets - 10 reps - 3-5" hold - Seated Transversus Abdominis Bracing  - 1 x daily - 7 x weekly - 3 sets - 10 reps - 3-5" hold - Supine Ankle Pumps  - 1 x daily - 7 x weekly - 3 sets - 10 reps - 3-5" hold - Supine Gluteal Sets  - 1 x daily - 7 x weekly - 3 sets - 10 reps - 3-5" hold - Supine Heel Slide  - 1 x daily - 7 x weekly - 3 sets - 10 reps - 3-5" hold  ASSESSMENT:  CLINICAL IMPRESSION: Began session with bike today to improve knee flexion.  Pt with visible difficulty getting out of chair.  Tends to rely on CG and pull instead of push.  Encouraged to work on this at home, pushing up and progressing to ability to complete without UE assist.  Mobility much slower than last visit and reports higher pain levels today.  Began standing stretches, hamstring and gastroc strengthening.  AROM slightly improved today to 84 degrees flexion. Encouraged to continue to push self at home. Pt will continue to benefit from skilled PT to address deficits and maximize functional ability.   Evaluation: Patient is a 72 y.o. female  who was seen today for physical therapy  evaluation and treatment for Lt Total knee replacement.  Evaluation demonstrates increased pain, increased edema, decreased mobility, decreased activity tolerance, decreased ROM, decreased balance and decreased strength.  Pt will continue to benefit from skilled PT to address these issues and maximize her functional ability.   OBJECTIVE IMPAIRMENTS: decreased activity tolerance, decreased balance, decreased mobility, difficulty walking, decreased ROM, decreased strength, and pain.   ACTIVITY LIMITATIONS: carrying, lifting, bending, sitting, standing, squatting, sleeping, stairs, dressing, and locomotion level  PARTICIPATION LIMITATIONS: cleaning, laundry, driving, shopping, community activity, and yard work  PERSONAL FACTORS: Fitness are also affecting patient's functional outcome.   REHAB POTENTIAL: Good  CLINICAL DECISION MAKING: Stable/uncomplicated  EVALUATION COMPLEXITY: Low   GOALS: Goals reviewed with patient? No  SHORT TERM GOALS: Target date: 08/20/22 Pt to be I in HEP in order to decrease her pain to no greater than a 5 Baseline: Goal status: INITIAL  2.  Pt ROM to be increased to  90 to allow improved comfort of sitting to be able to sit for 30 mintues  Baseline:  Goal status: INITIAL  3.  PT strength to be increased by one grade to allow pt to  be ambulating with a cane.in the home Baseline:  Goal status: INITIAL   LONG TERM GOALS: Target date: 09/10/22  Pt to be I in HEP in order to decrease her pain to no greater than a 2 Baseline:  Goal status: INITIAL  2.  Pt ROM to be increased to   120    to allow improved ability to squat to pick items off the floor  Baseline:  Goal status: INITIAL  3.  PT strength to be increased by one grade to allow pt to be ambulating with a cane in the community Baseline:  Goal status: INITIAL  4.  Pt to be able to go up and down 4 steps in a reciprocal manner to ger up to her bedroom  Baseline:  Goal status:  INITIAL     PLAN:  PT FREQUENCY: 2x/week  PT DURATION: 6 weeks  PLANNED INTERVENTIONS: Therapeutic exercises, Balance training, Gait training, Patient/Family education, Self Care, and Manual therapy  PLAN FOR NEXT SESSION: complete stair training on lower steps to ensure safety as pt has to negotiate steps to get to her bedroom, progress ROM and strength as able. Push mobility and AROM of Lt knee.   Lurena Nida, PTA/CLT [facility] Ph: [phone]   08/05/2022, 8:58 AM

## 2022-08-12 ENCOUNTER — Encounter (HOSPITAL_COMMUNITY): Payer: Medicare Other | Admitting: Physical Therapy

## 2022-08-12 ENCOUNTER — Encounter (HOSPITAL_COMMUNITY): Payer: Medicare Other

## 2022-08-17 ENCOUNTER — Ambulatory Visit (HOSPITAL_COMMUNITY): Payer: Medicare Other | Admitting: Physical Therapy

## 2022-08-17 DIAGNOSIS — M25662 Stiffness of left knee, not elsewhere classified: Secondary | ICD-10-CM | POA: Diagnosis not present

## 2022-08-17 DIAGNOSIS — M6281 Muscle weakness (generalized): Secondary | ICD-10-CM

## 2022-08-17 DIAGNOSIS — M25562 Pain in left knee: Secondary | ICD-10-CM

## 2022-08-17 NOTE — Therapy (Signed)
OUTPATIENT PHYSICAL THERAPY TREATMENT   Patient Name: Sarah Walker MRN: [id number] DOB:Oct 23, 1950, 72 y.o., female Today's Date: 08/17/2022  END OF SESSION:  PT End of Session - 08/17/22 1319     Visit Number 4    Number of Visits 12    Date for PT Re-Evaluation 09/10/22    Authorization Type [facility] medicare    Authorization - Number of Visits 12    Progress Note Due on Visit 10    PT Start Time 1035    PT Stop Time 1115    PT Time Calculation (min) 40 min    Activity Tolerance Patient tolerated treatment well    Behavior During Therapy WFL for tasks assessed/performed              Past Medical History:  Diagnosis Date   Abdominal hernia    Allergic rhinitis    Arthritis    Bone spur of other site    left foot   Dry eyes    Family history of kidney cancer    Family history of prostate cancer    Hypertension    Ovarian cancer (HCC)    Ovarian cancer on right (HCC) 06/17/2011   Peripheral neuropathy 08/26/2011   Chemotherapy-induced   Pulmonary embolism (HCC)    Sinusitis    Past Surgical History:  Procedure Laterality Date   ABDOMINAL HYSTERECTOMY  05/25/2011   [place] bs&o and cancer staging   CHOLECYSTECTOMY  1990   COLONOSCOPY N/A 09/06/2014   Procedure: COLONOSCOPY;  Surgeon: Malissa Hippo, MD;  [facility]: [facility];  Service: Endoscopy;  Laterality: N/A;  930 - moved to 9/2 @ 8:25   I & D EXTREMITY Left 12/13/2021   Procedure: IRRIGATION AND DEBRIDEMENT ANKLE/ LEG;  Surgeon: Sheral Apley, MD;  Location: [facility] OR;  Service: Orthopedics;  Laterality: Left;   INCISIONAL HERNIA REPAIR  10/15/2014   INCISIONAL HERNIA REPAIR  10/15/2014   Procedure: OPEN INCISIONAL HERNIA REPAIR WITH MESH AND MYOFASCIAL FLAPS;  Surgeon: Harriette Bouillon, MD;  Location: [facility] OR;  Service: General;;   IVC filter  05/24/11   PORT-A-CATH REMOVAL Right 11/21/2013   Procedure: MINOR REMOVAL PORT-A-CATH;  Surgeon: Dalia Heading, MD;  Location: [facility];  Service: General;  Laterality:  Right;   PORTACATH PLACEMENT  06/16/11   Done at [facility]   TONSILLECTOMY     age 26's   TOTAL HIP ARTHROPLASTY Right 08/26/2020   Procedure: TOTAL HIP ARTHROPLASTY ANTERIOR APPROACH;  Surgeon: Sheral Apley, MD;  Location: [facility];  Service: Orthopedics;  Laterality: Right;   TOTAL KNEE ARTHROPLASTY Left 07/20/2022   Procedure: TOTAL KNEE ARTHROPLASTY;  Surgeon: Sheral Apley, MD;  Location: [facility];  Service: Orthopedics;  Laterality: Left;   TUBAL LIGATION  1981   Patient Active Problem List   Diagnosis Date Noted   S/P TKR (total knee replacement) 07/21/2022   Status post left knee replacement 07/20/2022   Leg abscess 12/13/2021   Abscess of left leg 12/12/2021   Cellulitis 12/12/2021   Eczema 12/12/2021   Elevated serum creatinine 12/12/2021   S/P total right hip arthroplasty 08/26/2020   Vaginal atrophy 03/02/2017   History of ovarian cancer 03/02/2017   Chemotherapy-induced neuropathy (HCC) 03/31/2015   Incisional hernia, incarcerated 10/15/2014   Ventral hernia 07/15/2014   Ovarian cancer on right (HCC) 06/17/2011   Hypertension 06/17/2011   H/O hysterectomy with oophorectomy 06/17/2011    PCP: Dr. Assunta Found   REFERRING PROVIDER: Sheral Apley, MD  REFERRING DIAG:  Free Text Diagnosis  s/p L total knee replacement    THERAPY DIAG:  Pain in left knee Stiffness in left knee Edema Muscle weakness  Rationale for Evaluation and Treatment: Rehabilitation  ONSET DATE: 07/20/2022  SUBJECTIVE STATEMENT: Pt comes today using RW.  Reports pain of 2/10 with ambulation, 0/10 at rest and 6/10 with end ROM stretch.  States she is using her CPM 2 hours per day (cannot do the 6 hours as instruction).      PERTINENT HISTORY: Lt TKA 07/21/22.  As above  PAIN:  Are you having pain? Yes: NPRS scale: 3/10; worst 6 best 0 (with ice and elevated).  Pain location: Lt knee  Pain description: throbbing  Aggravating factors: activity Relieving factors: elevation    PRECAUTIONS: None     WEIGHT BEARING RESTRICTIONS: Yes WBAT  FALLS:  Has patient fallen in last 6 months? No  LIVING ENVIRONMENT: Lives with: lives with their family Lives in: House/apartment Stairs: Yes: Internal: 4 steps; on right going up and External: 2 steps; on right going up Has following equipment at home: Single point cane and Walker - 2 wheeled  OCCUPATION: retired  PLOF: Independent  PATIENT GOALS: less pain, more mobility  NEXT MD VISIT: 08/04/22  OBJECTIVE:    PATIENT SURVEYS:  FOTO 35  EDEMA:  normal for post op   LOWER EXTREMITY ROM:  Active ROM Right eval Left eval Left 08/17/22  Hip flexion     Hip extension     Hip abduction     Hip adduction     Hip internal rotation     Hip external rotation      Knee flexion  73  100  Knee extension  0   Ankle dorsiflexion     Ankle plantarflexion     Ankle inversion     Ankle eversion      (Blank rows = not tested)  LOWER EXTREMITY MMT:  MMT Right eval Left eval  Hip flexion    Hip extension    Hip abduction    Hip adduction    Hip internal rotation    Hip external rotation    Knee flexion  3+  Knee extension  3+  Ankle dorsiflexion  3+  Ankle plantarflexion    Ankle inversion    Ankle eversion     (Blank rows = not tested) FUNCTIONAL TESTS:  30 seconds chair stand test: needs UE very slow to painful at this time  2 minute walk test: with walker 72 ft Single leg stance:unable B    TODAY'S TREATMENT:                                                                                                                              DATE:  08/17/22 Bike seat 6 rocking 5 minutes Ambulation with SPC 50 feet X 2 Standing: heelraises 15X  Lt knee flexion stretch on 12" box 10X10"  Lt knee flexion  15X Stair negotiation 2RT lateral with bil UE assist (unable to do forward) Sit to stands with bil UE 5X Supine:  heelslides 5X  Lt SLR 10X Scar massage instruction AROM Lt LE 100  degrees  08/05/22 Bike seat 6 rocking 5 minutes Standing:: heelraises 10X  Lt knee flexion stretch on 6" box 10X10"  Lt knee flexion 10X Sit to stands with bil UE 5X Supine:  heelslides 5X  Lt SLR 10X  SAQ 10X5" AROM Lt LE 84 degrees   08/03/22 Goal review HEP review (ab bracing, GS, AP) Supine:  heel slides 10X5"  AROM 0-82  SLR Lt 10X Manual MFR soft tissue to lateral knee, edema below Seated:  LAQ  Heel slides  Self stretch using RT LE and rocking chair/swing instructions for home for flexion Sit to stands, unable to complete without UE assist  07/30/22 Evaluation   Seated Long Arc Quad 5reps - 3-5" hold - Seated Transversus Abdominis Bracing  -  10 reps - 3-5" hold - Supine Ankle Pumps  10 reps - 3-5" hold - Supine Gluteal Sets 10 reps - 3-5" hold - Supine Heel Slide  - 5 reps - 3-5" hold  PATIENT EDUCATION:  Education details: HEP Person educated: Patient Education method: Medical illustrator Education comprehension: returned demonstration  HOME EXERCISE PROGRAM: Access Code: [email or web address] URL: [email or web address] Date: 07/30/2022 Prepared by: Virgina Organ  Exercises - Seated Long Arc Quad  - 1 x daily - 7 x weekly - 3 sets - 10 reps - 3-5" hold - Seated Transversus Abdominis Bracing  - 1 x daily - 7 x weekly - 3 sets - 10 reps - 3-5" hold - Supine Ankle Pumps  - 1 x daily - 7 x weekly - 3 sets - 10 reps - 3-5" hold - Supine Gluteal Sets  - 1 x daily - 7 x weekly - 3 sets - 10 reps - 3-5" hold - Supine Heel Slide  - 1 x daily - 7 x weekly - 3 sets - 10 reps - 3-5" hold  ASSESSMENT:  CLINICAL IMPRESSION: Pt returns following 2 weeks missed due to weather/transportation. Began session with bike today to improve knee flexion.   Improved ability rising from chair from waiting room using bil UE's.  Mobility improved today as compared to 2 weeks prior, however still continues to use RW.  Pt also has limited flexion in Lt knee as she has not  been aggressively working on this at home.  Encouraged pt to spend more time on this at home. Continued with  standing stretches, hamstring and gastroc strengthening.  Began ambulation using SPC with reduced stride and step-to pattern. Attempted step to with 7" stairs, however unable to do this way at this time, only lateral step ups/down using bil UE on HR.  Instructed with scar massage along incision.  Therapist able to achieve several releases along scar line.  AROM improved today to 100 degrees flexion. Encouraged to continue to push self at home. Pt will continue to benefit from skilled PT to address deficits and maximize functional ability.   Evaluation: Patient is a 72 y.o. female  who was seen today for physical therapy evaluation and treatment for Lt Total knee replacement.  Evaluation demonstrates increased pain, increased edema, decreased mobility, decreased activity tolerance, decreased ROM, decreased balance and decreased strength.  Pt will continue to benefit from skilled PT to address these issues and maximize her functional ability.   OBJECTIVE IMPAIRMENTS: decreased activity tolerance, decreased balance, decreased mobility, difficulty  walking, decreased ROM, decreased strength, and pain.   ACTIVITY LIMITATIONS: carrying, lifting, bending, sitting, standing, squatting, sleeping, stairs, dressing, and locomotion level  PARTICIPATION LIMITATIONS: cleaning, laundry, driving, shopping, community activity, and yard work  PERSONAL FACTORS: Fitness are also affecting patient's functional outcome.   REHAB POTENTIAL: Good  CLINICAL DECISION MAKING: Stable/uncomplicated  EVALUATION COMPLEXITY: Low   GOALS: Goals reviewed with patient? No  SHORT TERM GOALS: Target date: 08/20/22 Pt to be I in HEP in order to decrease her pain to no greater than a 5 Baseline: Goal status: INITIAL  2.  Pt ROM to be increased to  90 to allow improved comfort of sitting to be able to sit for 30 mintues   Baseline:  Goal status: INITIAL  3.  PT strength to be increased by one grade to allow pt to be ambulating with a cane.in the home Baseline:  Goal status: INITIAL   LONG TERM GOALS: Target date: 09/10/22  Pt to be I in HEP in order to decrease her pain to no greater than a 2 Baseline:  Goal status: INITIAL  2.  Pt ROM to be increased to   120    to allow improved ability to squat to pick items off the floor  Baseline:  Goal status: INITIAL  3.  PT strength to be increased by one grade to allow pt to be ambulating with a cane in the community Baseline:  Goal status: INITIAL  4.  Pt to be able to go up and down 4 steps in a reciprocal manner to ger up to her bedroom  Baseline:  Goal status: INITIAL     PLAN:  PT FREQUENCY: 2x/week  PT DURATION: 6 weeks  PLANNED INTERVENTIONS: Therapeutic exercises, Balance training, Gait training, Patient/Family education, Self Care, and Manual therapy  PLAN FOR NEXT SESSION: Progress ROM and strength as able. Push mobility and AROM of Lt knee.   Lurena Nida, PTA/CLT [facility] Ph: [phone]   08/17/2022, 1:20 PM

## 2022-08-19 ENCOUNTER — Ambulatory Visit (HOSPITAL_COMMUNITY): Payer: Medicare Other

## 2022-08-19 ENCOUNTER — Encounter (HOSPITAL_COMMUNITY): Payer: Self-pay

## 2022-08-19 DIAGNOSIS — M25662 Stiffness of left knee, not elsewhere classified: Secondary | ICD-10-CM

## 2022-08-19 DIAGNOSIS — M6281 Muscle weakness (generalized): Secondary | ICD-10-CM | POA: Diagnosis not present

## 2022-08-19 DIAGNOSIS — M25562 Pain in left knee: Secondary | ICD-10-CM | POA: Diagnosis not present

## 2022-08-19 NOTE — Therapy (Signed)
OUTPATIENT PHYSICAL THERAPY TREATMENT   Patient Name: Sarah Walker MRN: [id number] DOB:20-May-1950, 72 y.o., female Today's Date: 08/19/2022  END OF SESSION:  PT End of Session - 08/19/22 1137     Visit Number 5    Number of Visits 12    Date for PT Re-Evaluation 09/10/22    Authorization Type [facility] medicare    Authorization - Visit Number 5    Authorization - Number of Visits 12    Progress Note Due on Visit 10    PT Start Time 1138    PT Stop Time 1222    PT Time Calculation (min) 44 min    Activity Tolerance Patient tolerated treatment well    Behavior During Therapy WFL for tasks assessed/performed              Past Medical History:  Diagnosis Date   Abdominal hernia    Allergic rhinitis    Arthritis    Bone spur of other site    left foot   Dry eyes    Family history of kidney cancer    Family history of prostate cancer    Hypertension    Ovarian cancer (HCC)    Ovarian cancer on right (HCC) 06/17/2011   Peripheral neuropathy 08/26/2011   Chemotherapy-induced   Pulmonary embolism (HCC)    Sinusitis    Past Surgical History:  Procedure Laterality Date   ABDOMINAL HYSTERECTOMY  05/25/2011   [place] bs&o and cancer staging   CHOLECYSTECTOMY  1990   COLONOSCOPY N/A 09/06/2014   Procedure: COLONOSCOPY;  Surgeon: Malissa Hippo, MD;  [facility];  Service: Endoscopy;  Laterality: N/A;  930 - moved to 9/2 @ 8:25   I & D EXTREMITY Left 12/13/2021   Procedure: IRRIGATION AND DEBRIDEMENT ANKLE/ LEG;  Surgeon: Sheral Apley, MD;  Location: [facility] OR;  Service: Orthopedics;  Laterality: Left;   INCISIONAL HERNIA REPAIR  10/15/2014   INCISIONAL HERNIA REPAIR  10/15/2014   Procedure: OPEN INCISIONAL HERNIA REPAIR WITH MESH AND MYOFASCIAL FLAPS;  Surgeon: Harriette Bouillon, MD;  Location: [facility] OR;  Service: General;;   IVC filter  05/24/11   PORT-A-CATH REMOVAL Right 11/21/2013   Procedure: MINOR REMOVAL PORT-A-CATH;  Surgeon: Dalia Heading, MD;  Location: [facility] ORS;   Service: General;  Laterality: Right;   PORTACATH PLACEMENT  06/16/11   Done at [facility]   TONSILLECTOMY     age 8's   TOTAL HIP ARTHROPLASTY Right 08/26/2020   Procedure: TOTAL HIP ARTHROPLASTY ANTERIOR APPROACH;  Surgeon: Sheral Apley, MD;  Location: [facility] ORS;  Service: Orthopedics;  Laterality: Right;   TOTAL KNEE ARTHROPLASTY Left 07/20/2022   Procedure: TOTAL KNEE ARTHROPLASTY;  Surgeon: Sheral Apley, MD;  Location: [facility] ORS;  Service: Orthopedics;  Laterality: Left;   TUBAL LIGATION  1981   Patient Active Problem List   Diagnosis Date Noted   S/P TKR (total knee replacement) 07/21/2022   Status post left knee replacement 07/20/2022   Leg abscess 12/13/2021   Abscess of left leg 12/12/2021   Cellulitis 12/12/2021   Eczema 12/12/2021   Elevated serum creatinine 12/12/2021   S/P total right hip arthroplasty 08/26/2020   Vaginal atrophy 03/02/2017   History of ovarian cancer 03/02/2017   Chemotherapy-induced neuropathy (HCC) 03/31/2015   Incisional hernia, incarcerated 10/15/2014   Ventral hernia 07/15/2014   Ovarian cancer on right [facility]) 06/17/2011   Hypertension 06/17/2011   H/O hysterectomy with oophorectomy 06/17/2011    PCP: Dr. Assunta Found  REFERRING PROVIDER: Sheral Apley, MD  REFERRING DIAG:  Free Text Diagnosis  s/p L total knee replacement    THERAPY DIAG:  Pain in left knee Stiffness in left knee Edema Muscle weakness  Rationale for Evaluation and Treatment: Rehabilitation  ONSET DATE: 07/20/2022  SUBJECTIVE STATEMENT: Pt arrived with 2 SPC.  Reports pain 1-2 with ambulation, 0/10 at rest but increased pain/stiffness when sitting.  Stated she has tried some scar tissue massage but doesn't feel the same, would like to review today.  Exercises are going good at home.  PERTINENT HISTORY: Lt TKA 07/21/22.  As above  PAIN:  Are you having pain? Yes: NPRS scale: 3/10; worst 6 best 0 (with ice and elevated).  Pain location: Lt knee  Pain  description: throbbing  Aggravating factors: activity Relieving factors: elevation   PRECAUTIONS: None     WEIGHT BEARING RESTRICTIONS: Yes WBAT  FALLS:  Has patient fallen in last 6 months? No  LIVING ENVIRONMENT: Lives with: lives with their family Lives in: House/apartment Stairs: Yes: Internal: 4 steps; on right going up and External: 2 steps; on right going up Has following equipment at home: Single point cane and Walker - 2 wheeled  OCCUPATION: retired  PLOF: Independent  PATIENT GOALS: less pain, more mobility  NEXT MD VISIT: 08/04/22  OBJECTIVE:    PATIENT SURVEYS:  FOTO 35  EDEMA:  normal for post op   LOWER EXTREMITY ROM:  Active ROM Right eval Left eval Left 08/17/22  Hip flexion     Hip extension     Hip abduction     Hip adduction     Hip internal rotation     Hip external rotation      Knee flexion  73  100  Knee extension  0   Ankle dorsiflexion     Ankle plantarflexion     Ankle inversion     Ankle eversion      (Blank rows = not tested)  LOWER EXTREMITY MMT:  MMT Right eval Left eval  Hip flexion    Hip extension    Hip abduction    Hip adduction    Hip internal rotation    Hip external rotation    Knee flexion  3+  Knee extension  3+  Ankle dorsiflexion  3+  Ankle plantarflexion    Ankle inversion    Ankle eversion     (Blank rows = not tested) FUNCTIONAL TESTS:  30 seconds chair stand test: needs UE very slow to painful at this time  2 minute walk test: with walker 72 ft Single leg stance:unable B    TODAY'S TREATMENT:                                                                                                                              DATE:  08/19/22:   Bike seat 6 rocking 5 minutes Standing:    Lt knee flexion stretch on 12" box 10X10"  STS with elevated mat height and cueing for nose over toes without HHA Gait with 1 SPC, cueing for step thru mechanics- SBA  Supine:   Bridge 10x5"  Heel slides  10x Scar tissue massage and instructed  AROM 103 degrees  08/17/22 Bike seat 6 rocking 5 minutes Ambulation with SPC 50 feet X 2 Standing: heelraises 15X  Lt knee flexion stretch on 12" box 10X10"  Lt knee flexion 15X Stair negotiation 2RT lateral with bil UE assist (unable to do forward) Sit to stands with bil UE 5X Supine:  heelslides 5X  Lt SLR 10X Scar massage instruction AROM Lt LE 100 degrees  08/05/22 Bike seat 6 rocking 5 minutes Standing:: heelraises 10X  Lt knee flexion stretch on 6" box 10X10"  Lt knee flexion 10X Sit to stands with bil UE 5X Supine:  heelslides 5X  Lt SLR 10X  SAQ 10X5" AROM Lt LE 84 degrees   08/03/22 Goal review HEP review (ab bracing, GS, AP) Supine:  heel slides 10X5"  AROM 0-82  SLR Lt 10X Manual MFR soft tissue to lateral knee, edema below Seated:  LAQ  Heel slides  Self stretch using RT LE and rocking chair/swing instructions for home for flexion Sit to stands, unable to complete without UE assist  07/30/22 Evaluation   Seated Long Arc Quad 5reps - 3-5" hold - Seated Transversus Abdominis Bracing  -  10 reps - 3-5" hold - Supine Ankle Pumps  10 reps - 3-5" hold - Supine Gluteal Sets 10 reps - 3-5" hold - Supine Heel Slide  - 5 reps - 3-5" hold  PATIENT EDUCATION:  Education details: HEP Person educated: Patient Education method: Medical illustrator Education comprehension: returned demonstration  HOME EXERCISE PROGRAM: Access Code: [email or web address] URL: [email or web address] Date: 07/30/2022 Prepared by: Virgina Organ  Exercises - Seated Long Arc Quad  - 1 x daily - 7 x weekly - 3 sets - 10 reps - 3-5" hold - Seated Transversus Abdominis Bracing  - 1 x daily - 7 x weekly - 3 sets - 10 reps - 3-5" hold - Supine Ankle Pumps  - 1 x daily - 7 x weekly - 3 sets - 10 reps - 3-5" hold - Supine Gluteal Sets  - 1 x daily - 7 x weekly - 3 sets - 10 reps - 3-5" hold - Supine Heel Slide  - 1 x daily - 7 x weekly - 3  sets - 10 reps - 3-5" hold  08/19/22: - Supine Bridge  - 2 x daily - 7 x weekly - 1 sets - 10 reps - 5" hold  ASSESSMENT:  CLINICAL IMPRESSION: Session focus with knee mobility and proximal strengthening.  Began session on bike for knee mobility, rocking only as unable to make full revolution.  Pt with extreme difficulty standing from standard height without HHA, elevated mat height and cueing for mechanics was able to achieve with some difficulty due to weakness.  Added bridges to HEP for gluteal strengthening with handout given.  Reviewed mechanics for scar tissue massage to reduce adhesions for knee mobility.  Pt able to achieve AROM 103 degrees flexion.   Evaluation: Patient is a 72 y.o. female  who was seen today for physical therapy evaluation and treatment for Lt Total knee replacement.  Evaluation demonstrates increased pain, increased edema, decreased mobility, decreased activity tolerance, decreased ROM, decreased balance and decreased strength.  Pt will continue to benefit from skilled PT to address these issues and maximize her functional ability.  OBJECTIVE IMPAIRMENTS: decreased activity tolerance, decreased balance, decreased mobility, difficulty walking, decreased ROM, decreased strength, and pain.   ACTIVITY LIMITATIONS: carrying, lifting, bending, sitting, standing, squatting, sleeping, stairs, dressing, and locomotion level  PARTICIPATION LIMITATIONS: cleaning, laundry, driving, shopping, community activity, and yard work  PERSONAL FACTORS: Fitness are also affecting patient's functional outcome.   REHAB POTENTIAL: Good  CLINICAL DECISION MAKING: Stable/uncomplicated  EVALUATION COMPLEXITY: Low   GOALS: Goals reviewed with patient? No  SHORT TERM GOALS: Target date: 08/20/22 Pt to be I in HEP in order to decrease her pain to no greater than a 5 Baseline: Goal status: IN PROGRESS  2.  Pt ROM to be increased to  90 to allow improved comfort of sitting to be able to  sit for 30 mintues  Baseline:  Goal status: MET  3.  PT strength to be increased by one grade to allow pt to be ambulating with a cane.in the home Baseline:  Goal status: IN PROGRESS   LONG TERM GOALS: Target date: 09/10/22  Pt to be I in HEP in order to decrease her pain to no greater than a 2 Baseline:  Goal status: IN PROGRESS  2.  Pt ROM to be increased to   120    to allow improved ability to squat to pick items off the floor  Baseline:  Goal status: IN PROGRESS  3.  PT strength to be increased by one grade to allow pt to be ambulating with a cane in the community Baseline:  Goal status: IN PROGRESS  4.  Pt to be able to go up and down 4 steps in a reciprocal manner to ger up to her bedroom  Baseline:  Goal status: IN PROGRESS     PLAN:  PT FREQUENCY: 2x/week  PT DURATION: 6 weeks  PLANNED INTERVENTIONS: Therapeutic exercises, Balance training, Gait training, Patient/Family education, Self Care, and Manual therapy  PLAN FOR NEXT SESSION: Progress ROM and strength as able. Push mobility and AROM of Lt knee.     Becky Sax, LPTA/CLT; [place] [place] [phone]  08/19/2022, 1:50 PM

## 2022-08-24 ENCOUNTER — Ambulatory Visit (HOSPITAL_COMMUNITY): Payer: Medicare Other | Admitting: Physical Therapy

## 2022-08-24 DIAGNOSIS — M6281 Muscle weakness (generalized): Secondary | ICD-10-CM | POA: Diagnosis not present

## 2022-08-24 DIAGNOSIS — M25662 Stiffness of left knee, not elsewhere classified: Secondary | ICD-10-CM | POA: Diagnosis not present

## 2022-08-24 DIAGNOSIS — M25562 Pain in left knee: Secondary | ICD-10-CM | POA: Diagnosis not present

## 2022-08-24 NOTE — Therapy (Signed)
OUTPATIENT PHYSICAL THERAPY TREATMENT   Patient Name: Sarah Walker MRN: [id number] DOB:12-16-1950, 72 y.o., female Today's Date: 08/24/2022  END OF SESSION:  PT End of Session - 08/24/22 1114     Visit Number 6    Number of Visits 12    Date for PT Re-Evaluation 09/10/22    Authorization Type [facility] medicare    Authorization - Number of Visits 12    Progress Note Due on Visit 10    PT Start Time 1116    PT Stop Time 1200    PT Time Calculation (min) 44 min    Activity Tolerance Patient tolerated treatment well    Behavior During Therapy WFL for tasks assessed/performed              Past Medical History:  Diagnosis Date   Abdominal hernia    Allergic rhinitis    Arthritis    Bone spur of other site    left foot   Dry eyes    Family history of kidney cancer    Family history of prostate cancer    Hypertension    Ovarian cancer (HCC)    Ovarian cancer on right (HCC) 06/17/2011   Peripheral neuropathy 08/26/2011   Chemotherapy-induced   Pulmonary embolism (HCC)    Sinusitis    Past Surgical History:  Procedure Laterality Date   ABDOMINAL HYSTERECTOMY  05/25/2011   [place] bs&o and cancer staging   CHOLECYSTECTOMY  1990   COLONOSCOPY N/A 09/06/2014   Procedure: COLONOSCOPY;  Surgeon: Malissa Hippo, MD;  [facility];  Service: Endoscopy;  Laterality: N/A;  930 - moved to 9/2 @ 8:25   I & D EXTREMITY Left 12/13/2021   Procedure: IRRIGATION AND DEBRIDEMENT ANKLE/ LEG;  Surgeon: Sheral Apley, MD;  Location: [facility] OR;  Service: Orthopedics;  Laterality: Left;   INCISIONAL HERNIA REPAIR  10/15/2014   INCISIONAL HERNIA REPAIR  10/15/2014   Procedure: OPEN INCISIONAL HERNIA REPAIR WITH MESH AND MYOFASCIAL FLAPS;  Surgeon: Harriette Bouillon, MD;  Location: [facility] OR;  Service: General;;   IVC filter  05/24/11   PORT-A-CATH REMOVAL Right 11/21/2013   Procedure: MINOR REMOVAL PORT-A-CATH;  Surgeon: Dalia Heading, MD;  Location: [facility];  Service: General;  Laterality:  Right;   PORTACATH PLACEMENT  06/16/11   Done at [facility]   TONSILLECTOMY     age 4's   TOTAL HIP ARTHROPLASTY Right 08/26/2020   Procedure: TOTAL HIP ARTHROPLASTY ANTERIOR APPROACH;  Surgeon: Sheral Apley, MD;  Location: [facility];  Service: Orthopedics;  Laterality: Right;   TOTAL KNEE ARTHROPLASTY Left 07/20/2022   Procedure: TOTAL KNEE ARTHROPLASTY;  Surgeon: Sheral Apley, MD;  Location: [facility];  Service: Orthopedics;  Laterality: Left;   TUBAL LIGATION  1981   Patient Active Problem List   Diagnosis Date Noted   S/P TKR (total knee replacement) 07/21/2022   Status post left knee replacement 07/20/2022   Leg abscess 12/13/2021   Abscess of left leg 12/12/2021   Cellulitis 12/12/2021   Eczema 12/12/2021   Elevated serum creatinine 12/12/2021   S/P total right hip arthroplasty 08/26/2020   Vaginal atrophy 03/02/2017   History of ovarian cancer 03/02/2017   Chemotherapy-induced neuropathy (HCC) 03/31/2015   Incisional hernia, incarcerated 10/15/2014   Ventral hernia 07/15/2014   Ovarian cancer on right (HCC) 06/17/2011   Hypertension 06/17/2011   H/O hysterectomy with oophorectomy 06/17/2011    PCP: Dr. Assunta Found   REFERRING PROVIDER: Sheral Apley, MD  REFERRING DIAG:  Free Text Diagnosis  s/p L total knee replacement    THERAPY DIAG:  Pain in left knee Stiffness in left knee Edema Muscle weakness  Rationale for Evaluation and Treatment: Rehabilitation  ONSET DATE: 07/20/2022  SUBJECTIVE STATEMENT: Pt arrived with 2 SPC.  Reports stiffness today and eager to use bike.  States she has a LE exerciser ordered and goes to the church daily to walk.   PERTINENT HISTORY: Lt TKA 07/21/22.  As above  PAIN:  Are you having pain? Yes: NPRS scale: 3/10; worst 6 best 0 (with ice and elevated).  Pain location: Lt knee  Pain description: throbbing  Aggravating factors: activity Relieving factors: elevation   PRECAUTIONS: None     WEIGHT BEARING  RESTRICTIONS: Yes WBAT  FALLS:  Has patient fallen in last 6 months? No  LIVING ENVIRONMENT: Lives with: lives with their family Lives in: House/apartment Stairs: Yes: Internal: 4 steps; on right going up and External: 2 steps; on right going up Has following equipment at home: Single point cane and Walker - 2 wheeled  OCCUPATION: retired  PLOF: Independent  PATIENT GOALS: less pain, more mobility  NEXT MD VISIT: 08/04/22  OBJECTIVE:    PATIENT SURVEYS:  FOTO 35  EDEMA:  normal for post op   LOWER EXTREMITY ROM:  Active ROM Right eval Left eval Left 08/17/22  Hip flexion     Hip extension     Hip abduction     Hip adduction     Hip internal rotation     Hip external rotation      Knee flexion  73  100  Knee extension  0   Ankle dorsiflexion     Ankle plantarflexion     Ankle inversion     Ankle eversion      (Blank rows = not tested)  LOWER EXTREMITY MMT:  MMT Right eval Left eval  Hip flexion    Hip extension    Hip abduction    Hip adduction    Hip internal rotation    Hip external rotation    Knee flexion  3+  Knee extension  3+  Ankle dorsiflexion  3+  Ankle plantarflexion    Ankle inversion    Ankle eversion     (Blank rows = not tested) FUNCTIONAL TESTS:  30 seconds chair stand test: needs UE very slow to painful at this time  2 minute walk test: with walker 72 ft Single leg stance:unable B    TODAY'S TREATMENT:                                                                                                                              DATE:  08/24/22:   Bike seat 6 rocking 5 minutes Standing:   Lt knee flexion stretch on 12" box 10X10" Lt knee flexion 10X  4" forward step ups 10X with Rt UE only High march holds with 1 HHA 10X Stairs  negotiation working on reciprocal pattern, bil UE 4" steps 3RT STS with foam in chair without HHA 10X  Gait with 1 SPC, cueing for step thru mechanics- SBA  Scar tissue massage and instructed    08/19/22:   Bike seat 6 rocking 5 minutes Standing:    Lt knee flexion stretch on 12" box 10X10" STS with elevated mat height and cueing for nose over toes without HHA Gait with 1 SPC, cueing for step thru mechanics- SBA  Supine:   Bridge 10x5"  Heel slides 10x Scar tissue massage and instructed  AROM 103 degrees  08/17/22 Bike seat 6 rocking 5 minutes Ambulation with SPC 50 feet X 2 Standing: heelraises 15X  Lt knee flexion stretch on 12" box 10X10"  Lt knee flexion 15X Stair negotiation 2RT lateral with bil UE assist (unable to do forward) Sit to stands with bil UE 5X Supine:  heelslides 5X  Lt SLR 10X Scar massage instruction AROM Lt LE 100 degrees  08/05/22 Bike seat 6 rocking 5 minutes Standing:: heelraises 10X  Lt knee flexion stretch on 6" box 10X10"  Lt knee flexion 10X Sit to stands with bil UE 5X Supine:  heelslides 5X  Lt SLR 10X  SAQ 10X5" AROM Lt LE 84 degrees   08/03/22 Goal review HEP review (ab bracing, GS, AP) Supine:  heel slides 10X5"  AROM 0-82  SLR Lt 10X Manual MFR soft tissue to lateral knee, edema below Seated:  LAQ  Heel slides  Self stretch using RT LE and rocking chair/swing instructions for home for flexion Sit to stands, unable to complete without UE assist  07/30/22 Evaluation   Seated Long Arc Quad 5reps - 3-5" hold - Seated Transversus Abdominis Bracing  -  10 reps - 3-5" hold - Supine Ankle Pumps  10 reps - 3-5" hold - Supine Gluteal Sets 10 reps - 3-5" hold - Supine Heel Slide  - 5 reps - 3-5" hold  PATIENT EDUCATION:  Education details: HEP Person educated: Patient Education method: Medical illustrator Education comprehension: returned demonstration  HOME EXERCISE PROGRAM: Access Code: [email or web address] URL: [email or web address] Date: 07/30/2022 Prepared by: Virgina Organ  Exercises - Seated Long Arc Quad  - 1 x daily - 7 x weekly - 3 sets - 10 reps - 3-5" hold - Seated Transversus Abdominis  Bracing  - 1 x daily - 7 x weekly - 3 sets - 10 reps - 3-5" hold - Supine Ankle Pumps  - 1 x daily - 7 x weekly - 3 sets - 10 reps - 3-5" hold - Supine Gluteal Sets  - 1 x daily - 7 x weekly - 3 sets - 10 reps - 3-5" hold - Supine Heel Slide  - 1 x daily - 7 x weekly - 3 sets - 10 reps - 3-5" hold  08/19/22: - Supine Bridge  - 2 x daily - 7 x weekly - 1 sets - 10 reps - 5" hold  ASSESSMENT:  CLINICAL IMPRESSION: PT comes today using 2 SPC canes for ambulation; instructed to use only 1 as we are trying to reduce her to no AD.  Pt is only using 2 canes in community, otherwise is using no AD inside her home. Session focus with knee mobility and proximal strengthening.  Began session on bike for knee mobility, rocking only as still unable to make full revolution.  Pt with difficulty getting feet up into pedals without use of UE's.  Added standing march to help assist with this as well  as beginning forward step ups and work on 4" stairwell to improve independence.  Pt able to complete reciprocally following instruction and increasing encouragement.  PT did have to use bil UE to complete in both directions.  Pt also able to complete standing from chair without UE following encouragement and initial assist from therapist.  PT overall has many learned behaviors from years of knee pain that she has acquired incorrect form completing and depending on UE to achieve.  Reviewed mechanics for scar tissue massage to reduce adhesions for knee mobility once again per patient request.  Pt will continue to benefit from therapy to increase independence, encouragement to reduce assistance.     Evaluation: Patient is a 72 y.o. female  who was seen today for physical therapy evaluation and treatment for Lt Total knee replacement.  Evaluation demonstrates increased pain, increased edema, decreased mobility, decreased activity tolerance, decreased ROM, decreased balance and decreased strength.  Pt will continue to benefit from  skilled PT to address these issues and maximize her functional ability.   OBJECTIVE IMPAIRMENTS: decreased activity tolerance, decreased balance, decreased mobility, difficulty walking, decreased ROM, decreased strength, and pain.   ACTIVITY LIMITATIONS: carrying, lifting, bending, sitting, standing, squatting, sleeping, stairs, dressing, and locomotion level  PARTICIPATION LIMITATIONS: cleaning, laundry, driving, shopping, community activity, and yard work  PERSONAL FACTORS: Fitness are also affecting patient's functional outcome.   REHAB POTENTIAL: Good  CLINICAL DECISION MAKING: Stable/uncomplicated  EVALUATION COMPLEXITY: Low   GOALS: Goals reviewed with patient? No  SHORT TERM GOALS: Target date: 08/20/22 Pt to be I in HEP in order to decrease her pain to no greater than a 5 Baseline: Goal status: IN PROGRESS  2.  Pt ROM to be increased to  90 to allow improved comfort of sitting to be able to sit for 30 mintues  Baseline:  Goal status: MET  3.  PT strength to be increased by one grade to allow pt to be ambulating with a cane.in the home Baseline:  Goal status: IN PROGRESS   LONG TERM GOALS: Target date: 09/10/22  Pt to be I in HEP in order to decrease her pain to no greater than a 2 Baseline:  Goal status: IN PROGRESS  2.  Pt ROM to be increased to   120    to allow improved ability to squat to pick items off the floor  Baseline:  Goal status: IN PROGRESS  3.  PT strength to be increased by one grade to allow pt to be ambulating with a cane in the community Baseline:  Goal status: IN PROGRESS  4.  Pt to be able to go up and down 4 steps in a reciprocal manner to ger up to her bedroom  Baseline:  Goal status: IN PROGRESS     PLAN:  PT FREQUENCY: 2x/week  PT DURATION: 6 weeks  PLANNED INTERVENTIONS: Therapeutic exercises, Balance training, Gait training, Patient/Family education, Self Care, and Manual therapy  PLAN FOR NEXT SESSION: Progress ROM and  strength as able. Push mobility and AROM of Lt knee. Increase functional independence.   Lurena Nida, PTA/CLT [facility] Ph: [phone]  08/24/2022, 12:08 PM

## 2022-08-26 ENCOUNTER — Ambulatory Visit (HOSPITAL_COMMUNITY): Payer: Medicare Other | Admitting: Physical Therapy

## 2022-08-26 DIAGNOSIS — M25562 Pain in left knee: Secondary | ICD-10-CM | POA: Diagnosis not present

## 2022-08-26 DIAGNOSIS — M6281 Muscle weakness (generalized): Secondary | ICD-10-CM

## 2022-08-26 DIAGNOSIS — M25662 Stiffness of left knee, not elsewhere classified: Secondary | ICD-10-CM

## 2022-08-26 NOTE — Therapy (Signed)
OUTPATIENT PHYSICAL THERAPY TREATMENT   Patient Name: Sarah Walker MRN: [id number] DOB:09/29/1950, 72 y.o., female Today's Date: 08/26/2022  END OF SESSION:  PT End of Session - 08/26/22 1113     Visit Number 7    Number of Visits 12    Date for PT Re-Evaluation 09/10/22    Authorization Type [facility] medicare    Authorization - Number of Visits 12    Progress Note Due on Visit 10    PT Start Time 1115    PT Stop Time 1155    PT Time Calculation (min) 40 min    Activity Tolerance Patient tolerated treatment well    Behavior During Therapy WFL for tasks assessed/performed              Past Medical History:  Diagnosis Date   Abdominal hernia    Allergic rhinitis    Arthritis    Bone spur of other site    left foot   Dry eyes    Family history of kidney cancer    Family history of prostate cancer    Hypertension    Ovarian cancer (HCC)    Ovarian cancer on right (HCC) 06/17/2011   Peripheral neuropathy 08/26/2011   Chemotherapy-induced   Pulmonary embolism (HCC)    Sinusitis    Past Surgical History:  Procedure Laterality Date   ABDOMINAL HYSTERECTOMY  05/25/2011   [place] bs&o and cancer staging   CHOLECYSTECTOMY  1990   COLONOSCOPY N/A 09/06/2014   Procedure: COLONOSCOPY;  Surgeon: Malissa Hippo, MD;  [facility];  Service: Endoscopy;  Laterality: N/A;  930 - moved to 9/2 @ 8:25   I & D EXTREMITY Left 12/13/2021   Procedure: IRRIGATION AND DEBRIDEMENT ANKLE/ LEG;  Surgeon: Sheral Apley, MD;  Location: [facility] OR;  Service: Orthopedics;  Laterality: Left;   INCISIONAL HERNIA REPAIR  10/15/2014   INCISIONAL HERNIA REPAIR  10/15/2014   Procedure: OPEN INCISIONAL HERNIA REPAIR WITH MESH AND MYOFASCIAL FLAPS;  Surgeon: Harriette Bouillon, MD;  Location: [facility] OR;  Service: General;;   IVC filter  05/24/11   PORT-A-CATH REMOVAL Right 11/21/2013   Procedure: MINOR REMOVAL PORT-A-CATH;  Surgeon: Dalia Heading, MD;  Location: [facility] ORS;  Service: General;  Laterality:  Right;   PORTACATH PLACEMENT  06/16/11   Done at [facility]   TONSILLECTOMY     age 58's   TOTAL HIP ARTHROPLASTY Right 08/26/2020   Procedure: TOTAL HIP ARTHROPLASTY ANTERIOR APPROACH;  Surgeon: Sheral Apley, MD;  Location: [facility] ORS;  Service: Orthopedics;  Laterality: Right;   TOTAL KNEE ARTHROPLASTY Left 07/20/2022   Procedure: TOTAL KNEE ARTHROPLASTY;  Surgeon: Sheral Apley, MD;  Location: [facility] ORS;  Service: Orthopedics;  Laterality: Left;   TUBAL LIGATION  1981   Patient Active Problem List   Diagnosis Date Noted   S/P TKR (total knee replacement) 07/21/2022   Status post left knee replacement 07/20/2022   Leg abscess 12/13/2021   Abscess of left leg 12/12/2021   Cellulitis 12/12/2021   Eczema 12/12/2021   Elevated serum creatinine 12/12/2021   S/P total right hip arthroplasty 08/26/2020   Vaginal atrophy 03/02/2017   History of ovarian cancer 03/02/2017   Chemotherapy-induced neuropathy (HCC) 03/31/2015   Incisional hernia, incarcerated 10/15/2014   Ventral hernia 07/15/2014   Ovarian cancer on right (HCC) 06/17/2011   Hypertension 06/17/2011   H/O hysterectomy with oophorectomy 06/17/2011    PCP: Dr. Assunta Found   REFERRING PROVIDER: Sheral Apley, MD  REFERRING DIAG:  Free Text Diagnosis  s/p L total knee replacement    THERAPY DIAG:  Pain in left knee Stiffness in left knee Edema Muscle weakness  Rationale for Evaluation and Treatment: Rehabilitation  ONSET DATE: 07/20/2022  SUBJECTIVE STATEMENT: Pt arrived with 1 SPC today.  Reports she's been working on her stairs at home.  States she has received her LE exerciser but has not opened it yet.   PERTINENT HISTORY: Lt TKA 07/21/22.  As above  PAIN:  Are you having pain? Yes: NPRS scale: 3/10; worst 6 best 0 (with ice and elevated).  Pain location: Lt knee  Pain description: throbbing  Aggravating factors: activity Relieving factors: elevation   PRECAUTIONS: None     WEIGHT BEARING  RESTRICTIONS: Yes WBAT  FALLS:  Has patient fallen in last 6 months? No  LIVING ENVIRONMENT: Lives with: lives with their family Lives in: House/apartment Stairs: Yes: Internal: 4 steps; on right going up and External: 2 steps; on right going up Has following equipment at home: Single point cane and Walker - 2 wheeled  OCCUPATION: retired  PLOF: Independent  PATIENT GOALS: less pain, more mobility  NEXT MD VISIT: 08/04/22  OBJECTIVE:    PATIENT SURVEYS:  FOTO 35  EDEMA:  normal for post op   LOWER EXTREMITY ROM:  Active ROM Right eval Left eval Left 08/17/22  Hip flexion     Hip extension     Hip abduction     Hip adduction     Hip internal rotation     Hip external rotation      Knee flexion  73  100  Knee extension  0   Ankle dorsiflexion     Ankle plantarflexion     Ankle inversion     Ankle eversion      (Blank rows = not tested)  LOWER EXTREMITY MMT:  MMT Right eval Left eval  Hip flexion    Hip extension    Hip abduction    Hip adduction    Hip internal rotation    Hip external rotation    Knee flexion  3+  Knee extension  3+  Ankle dorsiflexion  3+  Ankle plantarflexion    Ankle inversion    Ankle eversion     (Blank rows = not tested) FUNCTIONAL TESTS:  30 seconds chair stand test: needs UE very slow to painful at this time  2 minute walk test: with walker 72 ft Single leg stance:unable B    TODAY'S TREATMENT:                                                                                                                              DATE:  08/26/22:   Bike seat 6 rocking 5 minutes Standing:   Lt knee flexion stretch on 12" box 10X10" Lt knee flexion 20X  High march holds with 1 HHA 10X Stairs negotiation working on reciprocal pattern, bil UE 4" steps  3RT STS with foam in chair without HHA 10X  Lunges onto Lt LE no UE 4" step 10X  Vectors Lt LE 5X3" each with bil UE assist  08/24/22:   Bike seat 6 rocking 5 minutes Standing:    Lt knee flexion stretch on 12" box 10X10" Lt knee flexion 10X  4" forward step ups 10X with Rt UE only High march holds with 1 HHA 10X Stairs negotiation working on reciprocal pattern, bil UE 4" steps 3RT STS with foam in chair without HHA 10X  Gait with 1 SPC, cueing for step thru mechanics- SBA  Scar tissue massage and instructed   08/19/22:   Bike seat 6 rocking 5 minutes Standing:    Lt knee flexion stretch on 12" box 10X10" STS with elevated mat height and cueing for nose over toes without HHA Gait with 1 SPC, cueing for step thru mechanics- SBA  Supine:   Bridge 10x5"  Heel slides 10x Scar tissue massage and instructed  AROM 103 degrees  08/17/22 Bike seat 6 rocking 5 minutes Ambulation with SPC 50 feet X 2 Standing: heelraises 15X  Lt knee flexion stretch on 12" box 10X10"  Lt knee flexion 15X Stair negotiation 2RT lateral with bil UE assist (unable to do forward) Sit to stands with bil UE 5X Supine:  heelslides 5X  Lt SLR 10X Scar massage instruction AROM Lt LE 100 degrees  08/05/22 Bike seat 6 rocking 5 minutes Standing:: heelraises 10X  Lt knee flexion stretch on 6" box 10X10"  Lt knee flexion 10X Sit to stands with bil UE 5X Supine:  heelslides 5X  Lt SLR 10X  SAQ 10X5" AROM Lt LE 84 degrees   08/03/22 Goal review HEP review (ab bracing, GS, AP) Supine:  heel slides 10X5"  AROM 0-82  SLR Lt 10X Manual MFR soft tissue to lateral knee, edema below Seated:  LAQ  Heel slides  Self stretch using RT LE and rocking chair/swing instructions for home for flexion Sit to stands, unable to complete without UE assist  07/30/22 Evaluation   Seated Long Arc Quad 5reps - 3-5" hold - Seated Transversus Abdominis Bracing  -  10 reps - 3-5" hold - Supine Ankle Pumps  10 reps - 3-5" hold - Supine Gluteal Sets 10 reps - 3-5" hold - Supine Heel Slide  - 5 reps - 3-5" hold  PATIENT EDUCATION:  Education details: HEP Person educated: Patient Education method:  Medical illustrator Education comprehension: returned demonstration  HOME EXERCISE PROGRAM: Access Code: [email or web address] URL: [email or web address] Date: 07/30/2022 Prepared by: Virgina Organ  Exercises - Seated Long Arc Quad  - 1 x daily - 7 x weekly - 3 sets - 10 reps - 3-5" hold - Seated Transversus Abdominis Bracing  - 1 x daily - 7 x weekly - 3 sets - 10 reps - 3-5" hold - Supine Ankle Pumps  - 1 x daily - 7 x weekly - 3 sets - 10 reps - 3-5" hold - Supine Gluteal Sets  - 1 x daily - 7 x weekly - 3 sets - 10 reps - 3-5" hold - Supine Heel Slide  - 1 x daily - 7 x weekly - 3 sets - 10 reps - 3-5" hold  08/19/22: - Supine Bridge  - 2 x daily - 7 x weekly - 1 sets - 10 reps - 5" hold  ASSESSMENT:  CLINICAL IMPRESSION: PT comes today using only 1 SPC. Session focus with knee mobility and proximal strengthening.  Began session  on bike for knee mobility but still unable to make a full revolution yet. Continued with stair negotiation with noted improvement in descending control and form. Pt also able to complete standing from chair without UE following encouragement and initial assist from therapist.  Began forward lunges and vectors to begin challenging Lt LE stability.  Intermittent HHA needed for lunges and bil UE for vectors. .  Pt will continue to benefit from therapy to increase independence, encouragement to reduce assistance.     Evaluation: Patient is a 72 y.o. female  who was seen today for physical therapy evaluation and treatment for Lt Total knee replacement.  Evaluation demonstrates increased pain, increased edema, decreased mobility, decreased activity tolerance, decreased ROM, decreased balance and decreased strength.  Pt will continue to benefit from skilled PT to address these issues and maximize her functional ability.   OBJECTIVE IMPAIRMENTS: decreased activity tolerance, decreased balance, decreased mobility, difficulty walking, decreased ROM, decreased  strength, and pain.   ACTIVITY LIMITATIONS: carrying, lifting, bending, sitting, standing, squatting, sleeping, stairs, dressing, and locomotion level  PARTICIPATION LIMITATIONS: cleaning, laundry, driving, shopping, community activity, and yard work  PERSONAL FACTORS: Fitness are also affecting patient's functional outcome.   REHAB POTENTIAL: Good  CLINICAL DECISION MAKING: Stable/uncomplicated  EVALUATION COMPLEXITY: Low   GOALS: Goals reviewed with patient? No  SHORT TERM GOALS: Target date: 08/20/22 Pt to be I in HEP in order to decrease her pain to no greater than a 5 Baseline: Goal status: IN PROGRESS  2.  Pt ROM to be increased to  90 to allow improved comfort of sitting to be able to sit for 30 mintues  Baseline:  Goal status: MET  3.  PT strength to be increased by one grade to allow pt to be ambulating with a cane.in the home Baseline:  Goal status: IN PROGRESS   LONG TERM GOALS: Target date: 09/10/22  Pt to be I in HEP in order to decrease her pain to no greater than a 2 Baseline:  Goal status: IN PROGRESS  2.  Pt ROM to be increased to   120    to allow improved ability to squat to pick items off the floor  Baseline:  Goal status: IN PROGRESS  3.  PT strength to be increased by one grade to allow pt to be ambulating with a cane in the community Baseline:  Goal status: IN PROGRESS  4.  Pt to be able to go up and down 4 steps in a reciprocal manner to ger up to her bedroom  Baseline:  Goal status: IN PROGRESS     PLAN:  PT FREQUENCY: 2x/week  PT DURATION: 6 weeks  PLANNED INTERVENTIONS: Therapeutic exercises, Balance training, Gait training, Patient/Family education, Self Care, and Manual therapy  PLAN FOR NEXT SESSION: Progress ROM and strength as able. Push mobility and AROM of Lt knee. Increase functional independence.   Lurena Nida, PTA/CLT [facility] Ph: [phone]  08/26/2022, 11:40 AM

## 2022-08-31 ENCOUNTER — Ambulatory Visit (HOSPITAL_COMMUNITY): Payer: Medicare Other | Admitting: Physical Therapy

## 2022-08-31 DIAGNOSIS — M6281 Muscle weakness (generalized): Secondary | ICD-10-CM | POA: Diagnosis not present

## 2022-08-31 DIAGNOSIS — M25662 Stiffness of left knee, not elsewhere classified: Secondary | ICD-10-CM

## 2022-08-31 DIAGNOSIS — M25562 Pain in left knee: Secondary | ICD-10-CM | POA: Diagnosis not present

## 2022-08-31 NOTE — Therapy (Signed)
OUTPATIENT PHYSICAL THERAPY TREATMENT   Patient Name: Sarah Walker MRN: [id number] DOB:06-06-1950, 72 y.o., female Today's Date: 08/31/2022  END OF SESSION:  PT End of Session - 08/31/22 1135     Visit Number 8    Number of Visits 12    Date for PT Re-Evaluation 09/10/22    Authorization Type [facility] medicare    Authorization - Number of Visits 12    Progress Note Due on Visit 10    PT Start Time 1115    PT Stop Time 1155    PT Time Calculation (min) 40 min    Activity Tolerance Patient tolerated treatment well    Behavior During Therapy WFL for tasks assessed/performed              Past Medical History:  Diagnosis Date   Abdominal hernia    Allergic rhinitis    Arthritis    Bone spur of other site    left foot   Dry eyes    Family history of kidney cancer    Family history of prostate cancer    Hypertension    Ovarian cancer (HCC)    Ovarian cancer on right (HCC) 06/17/2011   Peripheral neuropathy 08/26/2011   Chemotherapy-induced   Pulmonary embolism (HCC)    Sinusitis    Past Surgical History:  Procedure Laterality Date   ABDOMINAL HYSTERECTOMY  05/25/2011   [place] bs&o and cancer staging   CHOLECYSTECTOMY  1990   COLONOSCOPY N/A 09/06/2014   Procedure: COLONOSCOPY;  Surgeon: Malissa Hippo, MD;  [facility];  Service: Endoscopy;  Laterality: N/A;  930 - moved to 9/2 @ 8:25   I & D EXTREMITY Left 12/13/2021   Procedure: IRRIGATION AND DEBRIDEMENT ANKLE/ LEG;  Surgeon: Sheral Apley, MD;  Location: [facility] OR;  Service: Orthopedics;  Laterality: Left;   INCISIONAL HERNIA REPAIR  10/15/2014   INCISIONAL HERNIA REPAIR  10/15/2014   Procedure: OPEN INCISIONAL HERNIA REPAIR WITH MESH AND MYOFASCIAL FLAPS;  Surgeon: Harriette Bouillon, MD;  Location: [facility] OR;  Service: General;;   IVC filter  05/24/11   PORT-A-CATH REMOVAL Right 11/21/2013   Procedure: MINOR REMOVAL PORT-A-CATH;  Surgeon: Dalia Heading, MD;  Location: [facility];  Service: General;  Laterality:  Right;   PORTACATH PLACEMENT  06/16/11   Done at [facility]   TONSILLECTOMY     age 63's   TOTAL HIP ARTHROPLASTY Right 08/26/2020   Procedure: TOTAL HIP ARTHROPLASTY ANTERIOR APPROACH;  Surgeon: Sheral Apley, MD;  Location: [facility]S;  Service: Orthopedics;  Laterality: Right;   TOTAL KNEE ARTHROPLASTY Left 07/20/2022   Procedure: TOTAL KNEE ARTHROPLASTY;  Surgeon: Sheral Apley, MD;  Location: [facility];  Service: Orthopedics;  Laterality: Left;   TUBAL LIGATION  1981   Patient Active Problem List   Diagnosis Date Noted   S/P TKR (total knee replacement) 07/21/2022   Status post left knee replacement 07/20/2022   Leg abscess 12/13/2021   Abscess of left leg 12/12/2021   Cellulitis 12/12/2021   Eczema 12/12/2021   Elevated serum creatinine 12/12/2021   S/P total right hip arthroplasty 08/26/2020   Vaginal atrophy 03/02/2017   History of ovarian cancer 03/02/2017   Chemotherapy-induced neuropathy (HCC) 03/31/2015   Incisional hernia, incarcerated 10/15/2014   Ventral hernia 07/15/2014   Ovarian cancer on right (HCC) 06/17/2011   Hypertension 06/17/2011   H/O hysterectomy with oophorectomy 06/17/2011    PCP: Dr. Assunta Found   REFERRING PROVIDER: Sheral Apley, MD  REFERRING DIAG:  Free Text Diagnosis  s/p L total knee replacement    THERAPY DIAG:  Pain in left knee Stiffness in left knee Edema Muscle weakness  Rationale for Evaluation and Treatment: Rehabilitation  ONSET DATE: 07/20/2022  SUBJECTIVE STATEMENT: Pt arrived with 2 SPC today. States sometimes she has to use both if her knees are stiff.  No pain just stiffness. Using her new LE exerciser.   PERTINENT HISTORY: Lt TKA 07/21/22.  As above  PAIN:  Are you having pain? Yes: NPRS scale: 3/10; worst 6 best 0 (with ice and elevated).  Pain location: Lt knee  Pain description: throbbing  Aggravating factors: activity Relieving factors: elevation   PRECAUTIONS: None     WEIGHT BEARING  RESTRICTIONS: Yes WBAT  FALLS:  Has patient fallen in last 6 months? No  LIVING ENVIRONMENT: Lives with: lives with their family Lives in: House/apartment Stairs: Yes: Internal: 4 steps; on right going up and External: 2 steps; on right going up Has following equipment at home: Single point cane and Walker - 2 wheeled  OCCUPATION: retired  PLOF: Independent  PATIENT GOALS: less pain, more mobility  NEXT MD VISIT: 08/04/22  OBJECTIVE:    PATIENT SURVEYS:  FOTO 35  EDEMA:  normal for post op   LOWER EXTREMITY ROM:  Active ROM Right eval Left eval Left 08/17/22 Left 08/31/22  Hip flexion      Hip extension      Hip abduction      Hip adduction      Hip internal rotation      Hip external rotation       Knee flexion  73  100 105  Knee extension  0    Ankle dorsiflexion      Ankle plantarflexion      Ankle inversion      Ankle eversion       (Blank rows = not tested)  LOWER EXTREMITY MMT:  MMT Right eval Left eval  Hip flexion    Hip extension    Hip abduction    Hip adduction    Hip internal rotation    Hip external rotation    Knee flexion  3+  Knee extension  3+  Ankle dorsiflexion  3+  Ankle plantarflexion    Ankle inversion    Ankle eversion     (Blank rows = not tested) FUNCTIONAL TESTS:  30 seconds chair stand test: needs UE very slow to painful at this time  2 minute walk test: with walker 72 ft Single leg stance:unable B    TODAY'S TREATMENT:                                                                                                                              DATE:  08/31/22:   Bike seat 6 rocking 5 minutes Standing:  Vectors Lt LE 5X3" each with bil UE assist  Heel raises 15X Lt knee flexion stretch on 12"  box 10X10" Lt knee flexion 20X  High march holds with 1 HHA 10X Stairs negotiation working on reciprocal pattern, bil UE 4" steps 3RT STS with foam in chair without HHA 10X  AROM supine 105 degrees, AAROM 115  08/26/22:    Bike seat 6 rocking 5 minutes Standing:   Lt knee flexion stretch on 12" box 10X10" Lt knee flexion 20X  High march holds with 1 HHA 10X Stairs negotiation working on reciprocal pattern, bil UE 4" steps 3RT STS with foam in chair without HHA 10X  Lunges onto Lt LE no UE 4" step 10X  Vectors Lt LE 5X3" each with bil UE assist  08/24/22:   Bike seat 6 rocking 5 minutes Standing:   Lt knee flexion stretch on 12" box 10X10" Lt knee flexion 10X  4" forward step ups 10X with Rt UE only High march holds with 1 HHA 10X Stairs negotiation working on reciprocal pattern, bil UE 4" steps 3RT STS with foam in chair without HHA 10X  Gait with 1 SPC, cueing for step thru mechanics- SBA  Scar tissue massage and instructed   08/19/22:   Bike seat 6 rocking 5 minutes Standing:    Lt knee flexion stretch on 12" box 10X10" STS with elevated mat height and cueing for nose over toes without HHA Gait with 1 SPC, cueing for step thru mechanics- SBA  Supine:   Bridge 10x5"  Heel slides 10x Scar tissue massage and instructed  AROM 103 degrees  08/17/22 Bike seat 6 rocking 5 minutes Ambulation with SPC 50 feet X 2 Standing: heelraises 15X  Lt knee flexion stretch on 12" box 10X10"  Lt knee flexion 15X Stair negotiation 2RT lateral with bil UE assist (unable to do forward) Sit to stands with bil UE 5X Supine:  heelslides 5X  Lt SLR 10X Scar massage instruction AROM Lt LE 100 degrees  08/05/22 Bike seat 6 rocking 5 minutes Standing:: heelraises 10X  Lt knee flexion stretch on 6" box 10X10"  Lt knee flexion 10X Sit to stands with bil UE 5X Supine:  heelslides 5X  Lt SLR 10X  SAQ 10X5" AROM Lt LE 84 degrees   08/03/22 Goal review HEP review (ab bracing, GS, AP) Supine:  heel slides 10X5"  AROM 0-82  SLR Lt 10X Manual MFR soft tissue to lateral knee, edema below Seated:  LAQ  Heel slides  Self stretch using RT LE and rocking chair/swing instructions for home for flexion Sit to  stands, unable to complete without UE assist  07/30/22 Evaluation   Seated Long Arc Quad 5reps - 3-5" hold - Seated Transversus Abdominis Bracing  -  10 reps - 3-5" hold - Supine Ankle Pumps  10 reps - 3-5" hold - Supine Gluteal Sets 10 reps - 3-5" hold - Supine Heel Slide  - 5 reps - 3-5" hold  PATIENT EDUCATION:  Education details: HEP Person educated: Patient Education method: Medical illustrator Education comprehension: returned demonstration  HOME EXERCISE PROGRAM: Access Code: [email or web address] URL: [email or web address]  Date: 07/30/2022 Prepared by: Virgina Organ Exercises - Seated Long Arc Quad  - 1 x daily - 7 x weekly - 3 sets - 10 reps - 3-5" hold - Seated Transversus Abdominis Bracing  - 1 x daily - 7 x weekly - 3 sets - 10 reps - 3-5" hold - Supine Ankle Pumps  - 1 x daily - 7 x weekly - 3 sets - 10 reps - 3-5" hold - Supine Gluteal Sets  - 1  x daily - 7 x weekly - 3 sets - 10 reps - 3-5" hold - Supine Heel Slide  - 1 x daily - 7 x weekly - 3 sets - 10 reps - 3-5" hold  08/19/22: - Supine Bridge  - 2 x daily - 7 x weekly - 1 sets - 10 reps - 5" hold  ASSESSMENT:  CLINICAL IMPRESSION: PT returns to MD tomorrow.  Continued with established therex with focus on ROM, functional strength.  Pt still unable to make full revolutions on bike, however very close to doing so.  Pt able to complete stairs with increased fluidity but still requires bil UE assist.  Noted most scar is at both ends and encouraged to continue manual on this area.  AROM to 105 and AAROM to 115 in supine.  Pt able to complete sit to stands without foam in chair and no UE.  Overall improving.     Evaluation: Patient is a 72 y.o. female  who was seen today for physical therapy evaluation and treatment for Lt Total knee replacement.  Evaluation demonstrates increased pain, increased edema, decreased mobility, decreased activity tolerance, decreased ROM, decreased balance and decreased  strength.  Pt will continue to benefit from skilled PT to address these issues and maximize her functional ability.   OBJECTIVE IMPAIRMENTS: decreased activity tolerance, decreased balance, decreased mobility, difficulty walking, decreased ROM, decreased strength, and pain.   ACTIVITY LIMITATIONS: carrying, lifting, bending, sitting, standing, squatting, sleeping, stairs, dressing, and locomotion level  PARTICIPATION LIMITATIONS: cleaning, laundry, driving, shopping, community activity, and yard work  PERSONAL FACTORS: Fitness are also affecting patient's functional outcome.   REHAB POTENTIAL: Good  CLINICAL DECISION MAKING: Stable/uncomplicated  EVALUATION COMPLEXITY: Low   GOALS: Goals reviewed with patient? No  SHORT TERM GOALS: Target date: 08/20/22 Pt to be I in HEP in order to decrease her pain to no greater than a 5 Baseline: Goal status: IN PROGRESS  2.  Pt ROM to be increased to  90 to allow improved comfort of sitting to be able to sit for 30 mintues  Baseline:  Goal status: MET  3.  PT strength to be increased by one grade to allow pt to be ambulating with a cane.in the home Baseline:  Goal status: IN PROGRESS   LONG TERM GOALS: Target date: 09/10/22  Pt to be I in HEP in order to decrease her pain to no greater than a 2 Baseline:  Goal status: IN PROGRESS  2.  Pt ROM to be increased to   120    to allow improved ability to squat to pick items off the floor  Baseline:  Goal status: IN PROGRESS  3.  PT strength to be increased by one grade to allow pt to be ambulating with a cane in the community Baseline:  Goal status: IN PROGRESS  4.  Pt to be able to go up and down 4 steps in a reciprocal manner to ger up to her bedroom  Baseline:  Goal status: IN PROGRESS     PLAN:  PT FREQUENCY: 2x/week  PT DURATION: 6 weeks  PLANNED INTERVENTIONS: Therapeutic exercises, Balance training, Gait training, Patient/Family education, Self Care, and Manual  therapy  PLAN FOR NEXT SESSION: Progress ROM and strength as able. Push mobility and AROM of Lt knee. Increase functional independence. F/U with MD appt.   Lurena Nida, PTA/CLT [facility] Ph: [phone]  08/31/2022, 12:25 PM

## 2022-09-01 DIAGNOSIS — M1712 Unilateral primary osteoarthritis, left knee: Secondary | ICD-10-CM | POA: Diagnosis not present

## 2022-09-02 ENCOUNTER — Encounter (HOSPITAL_COMMUNITY): Payer: Medicare Other

## 2022-09-02 ENCOUNTER — Ambulatory Visit (HOSPITAL_COMMUNITY): Payer: Medicare Other | Admitting: Physical Therapy

## 2022-09-02 DIAGNOSIS — M6281 Muscle weakness (generalized): Secondary | ICD-10-CM

## 2022-09-02 DIAGNOSIS — M25662 Stiffness of left knee, not elsewhere classified: Secondary | ICD-10-CM

## 2022-09-02 DIAGNOSIS — M25562 Pain in left knee: Secondary | ICD-10-CM | POA: Diagnosis not present

## 2022-09-02 NOTE — Therapy (Signed)
OUTPATIENT PHYSICAL THERAPY TREATMENT   Patient Name: Sarah Walker MRN: [id number] DOB:1950-12-08, 72 y.o., female Today's Date: 09/02/2022  END OF SESSION:  PT End of Session - 09/02/22 0953     Visit Number 9    Number of Visits 12    Date for PT Re-Evaluation 09/10/22    Authorization Type [facility] medicare    Authorization - Visit Number 9    Authorization - Number of Visits 12    Progress Note Due on Visit 10    PT Start Time [phone]    PT Stop Time 1030    PT Time Calculation (min) 42 min    Activity Tolerance Patient tolerated treatment well    Behavior During Therapy WFL for tasks assessed/performed              Past Medical History:  Diagnosis Date   Abdominal hernia    Allergic rhinitis    Arthritis    Bone spur of other site    left foot   Dry eyes    Family history of kidney cancer    Family history of prostate cancer    Hypertension    Ovarian cancer (HCC)    Ovarian cancer on right (HCC) 06/17/2011   Peripheral neuropathy 08/26/2011   Chemotherapy-induced   Pulmonary embolism (HCC)    Sinusitis    Past Surgical History:  Procedure Laterality Date   ABDOMINAL HYSTERECTOMY  05/25/2011   [place] bs&o and cancer staging   CHOLECYSTECTOMY  1990   COLONOSCOPY N/A 09/06/2014   Procedure: COLONOSCOPY;  Surgeon: Malissa Hippo, MD;  Location: [facility];  Service: Endoscopy;  Laterality: N/A;  930 - moved to 9/2 @ 8:25   I & D EXTREMITY Left 12/13/2021   Procedure: IRRIGATION AND DEBRIDEMENT ANKLE/ LEG;  Surgeon: Sheral Apley, MD;  Location: [facility] OR;  Service: Orthopedics;  Laterality: Left;   INCISIONAL HERNIA REPAIR  10/15/2014   INCISIONAL HERNIA REPAIR  10/15/2014   Procedure: OPEN INCISIONAL HERNIA REPAIR WITH MESH AND MYOFASCIAL FLAPS;  Surgeon: Harriette Bouillon, MD;  Location: [facility] OR;  Service: General;;   IVC filter  05/24/11   PORT-A-CATH REMOVAL Right 11/21/2013   Procedure: MINOR REMOVAL PORT-A-CATH;  Surgeon: Dalia Heading, MD;  Location: [facility] ORS;   Service: General;  Laterality: Right;   PORTACATH PLACEMENT  06/16/11   Done at [facility]   TONSILLECTOMY     age 36's   TOTAL HIP ARTHROPLASTY Right 08/26/2020   Procedure: TOTAL HIP ARTHROPLASTY ANTERIOR APPROACH;  Surgeon: Sheral Apley, MD;  Location: [facility] ORS;  Service: Orthopedics;  Laterality: Right;   TOTAL KNEE ARTHROPLASTY Left 07/20/2022   Procedure: TOTAL KNEE ARTHROPLASTY;  Surgeon: Sheral Apley, MD;  Location: [facility] OR[facility];  Service: Orthopedics;  Laterality: Left;   TUBAL LIGATION  1981   Patient Active Problem List   Diagnosis Date Noted   S/P TKR (total knee replacement) 07/21/2022   Status post left knee replacement 07/20/2022   Leg abscess 12/13/2021   Abscess of left leg 12/12/2021   Cellulitis 12/12/2021   Eczema 12/12/2021   Elevated serum creatinine 12/12/2021   S/P total right hip arthroplasty 08/26/2020   Vaginal atrophy 03/02/2017   History of ovarian cancer 03/02/2017   Chemotherapy-induced neuropathy (HCC) 03/31/2015   Incisional hernia, incarcerated 10/15/2014   Ventral hernia 07/15/2014   Ovarian cancer on right [facility]) 06/17/2011   Hypertension 06/17/2011   H/O hysterectomy with oophorectomy 06/17/2011    PCP: Dr. Assunta Found  REFERRING PROVIDER: Sheral Apley, MD  REFERRING DIAG:  Free Text Diagnosis  s/p L total knee replacement    THERAPY DIAG:  Pain in left knee Stiffness in left knee Edema Muscle weakness  Rationale for Evaluation and Treatment: Rehabilitation  ONSET DATE: 07/20/2022  SUBJECTIVE STATEMENT: Pt arrived with 1 SPC today. States MD appt went well. Using her new LE exerciser.   PERTINENT HISTORY: Lt TKA 07/21/22.  As above  PAIN:  Are you having pain? Yes: NPRS scale: 3/10; worst 6 best 0 (with ice and elevated).  Pain location: Lt knee  Pain description: throbbing  Aggravating factors: activity Relieving factors: elevation   PRECAUTIONS: None     WEIGHT BEARING RESTRICTIONS: Yes WBAT  FALLS:   Has patient fallen in last 6 months? No  LIVING ENVIRONMENT: Lives with: lives with their family Lives in: House/apartment Stairs: Yes: Internal: 4 steps; on right going up and External: 2 steps; on right going up Has following equipment at home: Single point cane and Walker - 2 wheeled  OCCUPATION: retired  PLOF: Independent  PATIENT GOALS: less pain, more mobility  NEXT MD VISIT: 08/04/22  OBJECTIVE:    PATIENT SURVEYS:  FOTO 35  EDEMA:  normal for post op   LOWER EXTREMITY ROM:  Active ROM Right eval Left eval Left 08/17/22 Left 08/31/22  Hip flexion      Hip extension      Hip abduction      Hip adduction      Hip internal rotation      Hip external rotation       Knee flexion  73  100 105  Knee extension  0    Ankle dorsiflexion      Ankle plantarflexion      Ankle inversion      Ankle eversion       (Blank rows = not tested)  LOWER EXTREMITY MMT:  MMT Right eval Left eval  Hip flexion    Hip extension    Hip abduction    Hip adduction    Hip internal rotation    Hip external rotation    Knee flexion  3+  Knee extension  3+  Ankle dorsiflexion  3+  Ankle plantarflexion    Ankle inversion    Ankle eversion     (Blank rows = not tested) FUNCTIONAL TESTS:  30 seconds chair stand test: needs UE very slow to painful at this time  2 minute walk test: with walker 72 ft Single leg stance:unable B    TODAY'S TREATMENT:                                                                                                                              DATE:  09/02/22:   Bike seat 7 rocking then full revolutions 6 minutes Standing:    Heel raises 20X Lt knee flexion stretch on 12" box 10X10" Lt knee flexion 20X with 1 UE assist  High march holds with 1 HHA 20X Vectors Lt LE 5X3" each with bil UE assist Forward step ups 6" 10X each with bil UE assist Lateral step ups 6" 10X each with bil UE assist  08/31/22:   Bike seat 6 rocking 5 minutes Standing:   Vectors Lt LE 5X3" each with bil UE assist  Heel raises 15X Lt knee flexion stretch on 12" box 10X10" Lt knee flexion 20X  High march holds with 1 HHA 10X Stairs negotiation working on reciprocal pattern, bil UE 4" steps 3RT STS with foam in chair without HHA 10X  AROM supine 105 degrees, AAROM 115  08/26/22:   Bike seat 6 rocking 5 minutes Standing:   Lt knee flexion stretch on 12" box 10X10" Lt knee flexion 20X  High march holds with 1 HHA 10X Stairs negotiation working on reciprocal pattern, bil UE 4" steps 3RT STS with foam in chair without HHA 10X  Lunges onto Lt LE no UE 4" step 10X  Vectors Lt LE 5X3" each with bil UE assist  08/24/22:   Bike seat 6 rocking 5 minutes Standing:   Lt knee flexion stretch on 12" box 10X10" Lt knee flexion 10X  4" forward step ups 10X with Rt UE only High march holds with 1 HHA 10X Stairs negotiation working on reciprocal pattern, bil UE 4" steps 3RT STS with foam in chair without HHA 10X  Gait with 1 SPC, cueing for step thru mechanics- SBA  Scar tissue massage and instructed   08/19/22:   Bike seat 6 rocking 5 minutes Standing:    Lt knee flexion stretch on 12" box 10X10" STS with elevated mat height and cueing for nose over toes without HHA Gait with 1 SPC, cueing for step thru mechanics- SBA  Supine:   Bridge 10x5"  Heel slides 10x Scar tissue massage and instructed  AROM 103 degrees  08/17/22 Bike seat 6 rocking 5 minutes Ambulation with SPC 50 feet X 2 Standing: heelraises 15X  Lt knee flexion stretch on 12" box 10X10"  Lt knee flexion 15X Stair negotiation 2RT lateral with bil UE assist (unable to do forward) Sit to stands with bil UE 5X Supine:  heelslides 5X  Lt SLR 10X Scar massage instruction AROM Lt LE 100 degrees  08/05/22 Bike seat 6 rocking 5 minutes Standing:: heelraises 10X  Lt knee flexion stretch on 6" box 10X10"  Lt knee flexion 10X Sit to stands with bil UE 5X Supine:  heelslides 5X  Lt SLR 10X  SAQ  10X5" AROM Lt LE 84 degrees   08/03/22 Goal review HEP review (ab bracing, GS, [facility]) Supine:  heel slides 10X5"  AROM 0-82  SLR Lt 10X Manual MFR soft tissue to lateral knee, edema below Seated:  LAQ  Heel slides  Self stretch using RT LE and rocking chair/swing instructions for home for flexion Sit to stands, unable to complete without UE assist  07/30/22 Evaluation   Seated Long Arc Quad 5reps - 3-5" hold - Seated Transversus Abdominis Bracing  -  10 reps - 3-5" hold - Supine Ankle Pumps  10 reps - 3-5" hold - Supine Gluteal Sets 10 reps - 3-5" hold - Supine Heel Slide  - 5 reps - 3-5" hold  PATIENT EDUCATION:  Education details: HEP Person educated: Patient Education method: Medical illustrator Education comprehension: returned demonstration  HOME EXERCISE PROGRAM: Access Code: [email or web address] URL: [email or web address]  Date: 07/30/2022 Prepared by: [place] [employer] Exercises - Seated Long Arc Quad  - 1  x daily - 7 x weekly - 3 sets - 10 reps - 3-5" hold - Seated Transversus Abdominis Bracing  - 1 x daily - 7 x weekly - 3 sets - 10 reps - 3-5" hold - Supine Ankle Pumps  - 1 x daily - 7 x weekly - 3 sets - 10 reps - 3-5" hold - Supine Gluteal Sets  - 1 x daily - 7 x weekly - 3 sets - 10 reps - 3-5" hold - Supine Heel Slide  - 1 x daily - 7 x weekly - 3 sets - 10 reps - 3-5" hold  08/19/22: - Supine Bridge  - 2 x daily - 7 x weekly - 1 sets - 10 reps - 5" hold  ASSESSMENT:  CLINICAL IMPRESSION: Pt reports MD pleased with progress.  Continued with established therex with focus on ROM, functional strength.  Pt able to make full revolutions on bike today following one step further back with seat and reported knee felt better after going all the way around.  Pt with overall imrpoving gait quality and speed using 1 SPC.  Stiffness upon initially standing taking a minute to "get going".  Increased step activity to 6" step height with cues to decrease UE  assist/pulling.      Evaluation: Patient is a 72 y.o. female  who was seen today for physical therapy evaluation and treatment for Lt Total knee replacement.  Evaluation demonstrates increased pain, increased edema, decreased mobility, decreased activity tolerance, decreased ROM, decreased balance and decreased strength.  Pt will continue to benefit from skilled PT to address these issues and maximize her functional ability.   OBJECTIVE IMPAIRMENTS: decreased activity tolerance, decreased balance, decreased mobility, difficulty walking, decreased ROM, decreased strength, and pain.   ACTIVITY LIMITATIONS: carrying, lifting, bending, sitting, standing, squatting, sleeping, stairs, dressing, and locomotion level  PARTICIPATION LIMITATIONS: cleaning, laundry, driving, shopping, community activity, and yard work  PERSONAL FACTORS: Fitness are also affecting patient's functional outcome.   REHAB POTENTIAL: Good  CLINICAL DECISION MAKING: Stable/uncomplicated  EVALUATION COMPLEXITY: Low   GOALS: Goals reviewed with patient? No  SHORT TERM GOALS: Target date: 08/20/22 Pt to be I in HEP in order to decrease her pain to no greater than a 5 Baseline: Goal status: IN PROGRESS  2.  Pt ROM to be increased to  90 to allow improved comfort of sitting to be able to sit for 30 mintues  Baseline:  Goal status: MET  3.  PT strength to be increased by one grade to allow pt to be ambulating with a cane.in the home Baseline:  Goal status: IN PROGRESS   LONG TERM GOALS: Target date: 09/10/22  Pt to be I in HEP in order to decrease her pain to no greater than a 2 Baseline:  Goal status: IN PROGRESS  2.  Pt ROM to be increased to   120    to allow improved ability to squat to pick items off the floor  Baseline:  Goal status: IN PROGRESS  3.  PT strength to be increased by one grade to allow pt to be ambulating with a cane in the community Baseline:  Goal status: IN PROGRESS  4.  Pt to be able to  go up and down 4 steps in a reciprocal manner to ger up to her bedroom  Baseline:  Goal status: IN PROGRESS     PLAN:  PT FREQUENCY: 2x/week  PT DURATION: 6 weeks  PLANNED INTERVENTIONS: Therapeutic exercises, Balance training, Gait training, Patient/Family  education, Self Care, and Manual therapy  PLAN FOR NEXT SESSION: Progress ROM and strength as able. Push mobility and AROM of Lt knee. Increase functional independence.   Lurena Nida, PTA/CLT [facility] Ph: [phone]  09/02/2022, 11:06 AM

## 2022-09-07 ENCOUNTER — Ambulatory Visit (HOSPITAL_COMMUNITY): Payer: Medicare Other | Attending: Family Medicine | Admitting: Physical Therapy

## 2022-09-07 DIAGNOSIS — M25662 Stiffness of left knee, not elsewhere classified: Secondary | ICD-10-CM | POA: Diagnosis not present

## 2022-09-07 DIAGNOSIS — M6281 Muscle weakness (generalized): Secondary | ICD-10-CM | POA: Insufficient documentation

## 2022-09-07 DIAGNOSIS — M25562 Pain in left knee: Secondary | ICD-10-CM | POA: Diagnosis not present

## 2022-09-07 NOTE — Therapy (Addendum)
OUTPATIENT PHYSICAL THERAPY TREATMENT Progress Note Reporting Period 07/30/22 to 09/06/32  See note below for Objective Data and Assessment of Progress/Goals.      Patient Name: Sarah Walker MRN: [id number] DOB:Jan 22, 1950, 72 y.o., female Today's Date: 09/07/2022  END OF SESSION:  PT End of Session - 09/07/22 1128     Visit Number 10    Number of Visits 12    Date for PT Re-Evaluation 09/10/22    Authorization Type [facility] medicare    Authorization - Number of Visits 12    Progress Note Due on Visit 12    PT Start Time 1113    PT Stop Time 1155    PT Time Calculation (min) 42 min    Activity Tolerance Patient tolerated treatment well    Behavior During Therapy WFL for tasks assessed/performed              Past Medical History:  Diagnosis Date   Abdominal hernia    Allergic rhinitis    Arthritis    Bone spur of other site    left foot   Dry eyes    Family history of kidney cancer    Family history of prostate cancer    Hypertension    Ovarian cancer (HCC)    Ovarian cancer on right (HCC) 06/17/2011   Peripheral neuropathy 08/26/2011   Chemotherapy-induced   Pulmonary embolism (HCC)    Sinusitis    Past Surgical History:  Procedure Laterality Date   ABDOMINAL HYSTERECTOMY  05/25/2011   [place] bs&o and cancer staging   CHOLECYSTECTOMY  1990   COLONOSCOPY N/A 09/06/2014   Procedure: COLONOSCOPY;  Surgeon: Malissa Hippo, MD;  Location: [facility];  Service: Endoscopy;  Laterality: N/A;  930 - moved to 9/2 @ 8:25   I & D EXTREMITY Left 12/13/2021   Procedure: IRRIGATION AND DEBRIDEMENT ANKLE/ LEG;  Surgeon: Sheral Apley, MD;  Location: [facility] OR;  Service: Orthopedics;  Laterality: Left;   INCISIONAL HERNIA REPAIR  10/15/2014   INCISIONAL HERNIA REPAIR  10/15/2014   Procedure: OPEN INCISIONAL HERNIA REPAIR WITH MESH AND MYOFASCIAL FLAPS;  Surgeon: Harriette Bouillon, MD;  Location: [facility] OR;  Service: General;;   IVC filter  05/24/11   PORT-A-CATH REMOVAL Right 11/21/2013    Procedure: MINOR REMOVAL PORT-A-CATH;  Surgeon: Dalia Heading, MD;  Location: [facility] ORS;  Service: General;  Laterality: Right;   PORTACATH PLACEMENT  06/16/11   Done at [facility]   TONSILLECTOMY     age 60's   TOTAL HIP ARTHROPLASTY Right 08/26/2020   Procedure: TOTAL HIP ARTHROPLASTY ANTERIOR APPROACH;  Surgeon: Sheral Apley, MD;  Location: [facility] ORS;  Service: Orthopedics;  Laterality: Right;   TOTAL KNEE ARTHROPLASTY Left 07/20/2022   Procedure: TOTAL KNEE ARTHROPLASTY;  Surgeon: Sheral Apley, MD;  Location: [facility] ORS;  Service: Orthopedics;  Laterality: Left;   TUBAL LIGATION  1981   Patient Active Problem List   Diagnosis Date Noted   S/P TKR (total knee replacement) 07/21/2022   Status post left knee replacement 07/20/2022   Leg abscess 12/13/2021   Abscess of left leg 12/12/2021   Cellulitis 12/12/2021   Eczema 12/12/2021   Elevated serum creatinine 12/12/2021   S/P total right hip arthroplasty 08/26/2020   Vaginal atrophy 03/02/2017   History of ovarian cancer 03/02/2017   Chemotherapy-induced neuropathy (HCC) 03/31/2015   Incisional hernia, incarcerated 10/15/2014   Ventral hernia 07/15/2014   Ovarian cancer on right [facility]) 06/17/2011   Hypertension 06/17/2011  H/O hysterectomy with oophorectomy 06/17/2011    PCP: Dr. Assunta Found   REFERRING PROVIDER: Sheral Apley, MD  REFERRING DIAG:  Free Text Diagnosis  s/p L total knee replacement    THERAPY DIAG:  Pain in left knee Stiffness in left knee Edema Muscle weakness  Rationale for Evaluation and Treatment: Rehabilitation  ONSET DATE: 07/20/2022  SUBJECTIVE STATEMENT: Pt states she is doing so much better.  No real pain just stiffness.    PERTINENT HISTORY: Lt TKA 07/21/22.  As above  PAIN:  Are you having pain? Yes: NPRS scale: 3/10; worst 6 best 0 (with ice and elevated).  Pain location: Lt knee  Pain description: throbbing  Aggravating factors: activity Relieving factors: elevation    PRECAUTIONS: None     WEIGHT BEARING RESTRICTIONS: Yes WBAT  FALLS:  Has patient fallen in last 6 months? No  LIVING ENVIRONMENT: Lives with: lives with their family Lives in: House/apartment Stairs: Yes: Internal: 4 steps; on right going up and External: 2 steps; on right going up Has following equipment at home: Single point cane and Walker - 2 wheeled  OCCUPATION: retired  PLOF: Independent  PATIENT GOALS: less pain, more mobility  NEXT MD VISIT: 08/04/22  OBJECTIVE:    PATIENT SURVEYS:  FOTO 35  evaluation;  62.7% 09/07/22  EDEMA:  normal for post op   LOWER EXTREMITY ROM:  Active ROM Right eval Left eval Left 08/17/22 Left 08/31/22 Left 09/07/22  Hip flexion       Hip extension       Hip abduction       Hip adduction       Hip internal rotation       Hip external rotation        Knee flexion  73  100 105 110A 120 AA  Knee extension  0     Ankle dorsiflexion       Ankle plantarflexion       Ankle inversion       Ankle eversion        (Blank rows = not tested)  LOWER EXTREMITY MMT:  MMT Right eval Left eval Right 09/07/22 Left 09/07/22  Hip flexion      Hip extension      Hip abduction      Hip adduction      Hip internal rotation      Hip external rotation      Knee flexion  3+ 4+ 4+  Knee extension  3+ 4+ 4+  Ankle dorsiflexion  3+ 4 4  Ankle plantarflexion      Ankle inversion      Ankle eversion       (Blank rows = not tested) FUNCTIONAL TESTS:  Evaluation: 30 seconds chair stand test: needs UE very slow to painful at this time  2 minute walk test: with walker 72 ft Single leg stance:unable B   09/07/22: 30 seconds chair stand test:  4X no UE with therapist CGA (was once, needing UE and painful)  2 minute walk test: SPC 145' (was RW 72 ft) Single leg stance:  2" bil (was unable Bil)   TODAY'S TREATMENT:  DATE:  09/07/22:  Progress note  Bike seat 7 rocking then full revolutions 6 minutes Standing:    Heel raises 20X Lt knee flexion stretch on 12" box 10X10" Lt knee flexion 20X with 1 UE assist Functional tests:  30 seconds chair stand test:  4X no UE with therapist CGA (was once, needing UE and painful)  2 minute walk test: SPC 145' (was RW 72 ft) Single leg stance:  2" bil (was unable Bil) FOTO 62.7% (was 35% evaluation)  09/02/22:   Bike seat 7 rocking then full revolutions 6 minutes Standing:    Heel raises 20X Lt knee flexion stretch on 12" box 10X10" Lt knee flexion 20X with 1 UE assist High march holds with 1 HHA 20X Vectors Lt LE 5X3" each with bil UE assist Forward step ups 6" 10X each with bil UE assist Lateral step ups 6" 10X each with bil UE assist  08/31/22:   Bike seat 6 rocking 5 minutes Standing:  Vectors Lt LE 5X3" each with bil UE assist  Heel raises 15X Lt knee flexion stretch on 12" box 10X10" Lt knee flexion 20X  High march holds with 1 HHA 10X Stairs negotiation working on reciprocal pattern, bil UE 4" steps 3RT STS with foam in chair without HHA 10X  AROM supine 105 degrees, AAROM 115  08/26/22:   Bike seat 6 rocking 5 minutes Standing:   Lt knee flexion stretch on 12" box 10X10" Lt knee flexion 20X  High march holds with 1 HHA 10X Stairs negotiation working on reciprocal pattern, bil UE 4" steps 3RT STS with foam in chair without HHA 10X  Lunges onto Lt LE no UE 4" step 10X  Vectors Lt LE 5X3" each with bil UE assist  08/24/22:   Bike seat 6 rocking 5 minutes Standing:   Lt knee flexion stretch on 12" box 10X10" Lt knee flexion 10X  4" forward step ups 10X with Rt UE only High march holds with 1 HHA 10X Stairs negotiation working on reciprocal pattern, bil UE 4" steps 3RT STS with foam in chair without HHA 10X  Gait with 1 SPC, cueing for step thru mechanics- SBA  Scar tissue massage and instructed   08/19/22:   Bike seat 6 rocking 5  minutes Standing:    Lt knee flexion stretch on 12" box 10X10" STS with elevated mat height and cueing for nose over toes without HHA Gait with 1 SPC, cueing for step thru mechanics- SBA  Supine:   Bridge 10x5"  Heel slides 10x Scar tissue massage and instructed  AROM 103 degrees  08/17/22 Bike seat 6 rocking 5 minutes Ambulation with SPC 50 feet X 2 Standing: heelraises 15X  Lt knee flexion stretch on 12" box 10X10"  Lt knee flexion 15X Stair negotiation 2RT lateral with bil UE assist (unable to do forward) Sit to stands with bil UE 5X Supine:  heelslides 5X  Lt SLR 10X Scar massage instruction AROM Lt LE 100 degrees  08/05/22 Bike seat 6 rocking 5 minutes Standing:: heelraises 10X  Lt knee flexion stretch on 6" box 10X10"  Lt knee flexion 10X Sit to stands with bil UE 5X Supine:  heelslides 5X  Lt SLR 10X  SAQ 10X5" AROM Lt LE 84 degrees   08/03/22 Goal review HEP review (ab bracing, GS, [facility]) Supine:  heel slides 10X5"  AROM 0-82  SLR Lt 10X Manual MFR soft tissue to lateral knee, edema below Seated:  LAQ  Heel slides  Self stretch  using RT LE and rocking chair/swing instructions for home for flexion Sit to stands, unable to complete without UE assist  07/30/22 Evaluation   Seated Long Arc Quad 5reps - 3-5" hold - Seated Transversus Abdominis Bracing  -  10 reps - 3-5" hold - Supine Ankle Pumps  10 reps - 3-5" hold - Supine Gluteal Sets 10 reps - 3-5" hold - Supine Heel Slide  - 5 reps - 3-5" hold  PATIENT EDUCATION:  Education details: HEP Person educated: Patient Education method: Medical illustrator Education comprehension: returned demonstration  HOME EXERCISE PROGRAM: Access Code: [email or web address] URL: [email or web address]  Date: 07/30/2022 Prepared by: Virgina Organ Exercises - Seated Long Arc Quad  - 1 x daily - 7 x weekly - 3 sets - 10 reps - 3-5" hold - Seated Transversus Abdominis Bracing  - 1 x daily - 7 x weekly - 3 sets  - 10 reps - 3-5" hold - Supine Ankle Pumps  - 1 x daily - 7 x weekly - 3 sets - 10 reps - 3-5" hold - Supine Gluteal Sets  - 1 x daily - 7 x weekly - 3 sets - 10 reps - 3-5" hold - Supine Heel Slide  - 1 x daily - 7 x weekly - 3 sets - 10 reps - 3-5" hold  08/19/22: - Supine Bridge  - 2 x daily - 7 x weekly - 1 sets - 10 reps - 5" hold  ASSESSMENT:  CLINICAL IMPRESSION: 10th visit progress note completed this session.  As noted with functional testing, pt has improved quite a bit since initial evaluation.  Pt has met all short term goals and 2/4 long term. FOTO completed with near 28% improvement.  Most deficits that remain include functional abilities, i.e. stair negotiation, however she has been unable to complete higher stair negotiation since her Rt hip THR.  Pt feels she may be ready for discharge following this certification period pending comfort and abilities present at that time.  Pt will continue to benefit from skilled therapy.  Evaluation: Patient is a 72 y.o. female  who was seen today for physical therapy evaluation and treatment for Lt Total knee replacement.  Evaluation demonstrates increased pain, increased edema, decreased mobility, decreased activity tolerance, decreased ROM, decreased balance and decreased strength.  Pt will continue to benefit from skilled PT to address these issues and maximize her functional ability.   OBJECTIVE IMPAIRMENTS: decreased activity tolerance, decreased balance, decreased mobility, difficulty walking, decreased ROM, decreased strength, and pain.   ACTIVITY LIMITATIONS: carrying, lifting, bending, sitting, standing, squatting, sleeping, stairs, dressing, and locomotion level  PARTICIPATION LIMITATIONS: cleaning, laundry, driving, shopping, community activity, and yard work  PERSONAL FACTORS: Fitness are also affecting patient's functional outcome.   REHAB POTENTIAL: Good  CLINICAL DECISION MAKING: Stable/uncomplicated  EVALUATION COMPLEXITY:  Low   GOALS: Goals reviewed with patient? No  SHORT TERM GOALS: Target date: 08/20/22 Pt to be I in HEP in order to decrease her pain to no greater than a 5 Baseline: Goal status: MET  2.  Pt ROM to be increased to  90 to allow improved comfort of sitting to be able to sit for 30 mintues  Baseline:  Goal status: MET  3.  PT strength to be increased by one grade to allow pt to be ambulating with a cane.in the home Baseline:  Goal status:  MET   LONG TERM GOALS: Target date: 09/10/22  Pt to be I in HEP in order to decrease her pain to  no greater than a 2 Baseline:  Goal status: MET (stiffness no pain)  2.  Pt ROM to be increased to 120  to allow improved ability to squat to pick items off the floor  Baseline:  Goal status: PARTLY MET (AAROM now 120 degrees, however unable to pick items off floor)  3.  PT strength to be increased by one grade to allow pt to be ambulating with a cane in the community Baseline:  Goal status: MET  4.  Pt to be able to go up and down 4 steps in a reciprocal manner to ger up to her bedroom  Baseline:  Goal status: PARTLY MET  (can do 4" steps but not standard 6")     PLAN:  PT FREQUENCY: 2x/week  PT DURATION: 6 weeks  PLANNED INTERVENTIONS: Therapeutic exercises, Balance training, Gait training, Patient/Family education, Self Care, and Manual therapy  PLAN FOR NEXT SESSION: Progress ROM and strength as able. Push mobility and AROM of Lt knee. Increase functional independence.   Lurena Nida, PTA/CLT [facility] [place] Ph: [phone] Virgina Organ, PT CLT [phone]  09/07/2022, 11:29 AM

## 2022-09-09 ENCOUNTER — Encounter (HOSPITAL_COMMUNITY): Payer: Medicare Other | Admitting: Physical Therapy

## 2022-09-09 ENCOUNTER — Ambulatory Visit (HOSPITAL_COMMUNITY): Payer: Medicare Other | Admitting: Physical Therapy

## 2022-09-09 DIAGNOSIS — M25662 Stiffness of left knee, not elsewhere classified: Secondary | ICD-10-CM

## 2022-09-09 DIAGNOSIS — M6281 Muscle weakness (generalized): Secondary | ICD-10-CM | POA: Diagnosis not present

## 2022-09-09 DIAGNOSIS — M25562 Pain in left knee: Secondary | ICD-10-CM

## 2022-09-09 NOTE — Therapy (Signed)
OUTPATIENT PHYSICAL THERAPY TREATMENT    Patient Name: Sarah Walker MRN: [id number] DOB:1950-12-21, 72 y.o., female Today's Date: 09/09/2022  END OF SESSION:  PT End of Session - 09/09/22 1207     Visit Number 11    Number of Visits 12    Date for PT Re-Evaluation 09/10/22    Authorization Type [facility] medicare    Authorization - Number of Visits 12    Progress Note Due on Visit 12    PT Start Time 1118    PT Stop Time 1200    PT Time Calculation (min) 42 min    Activity Tolerance Patient tolerated treatment well    Behavior During Therapy WFL for tasks assessed/performed               Past Medical History:  Diagnosis Date   Abdominal hernia    Allergic rhinitis    Arthritis    Bone spur of other site    left foot   Dry eyes    Family history of kidney cancer    Family history of prostate cancer    Hypertension    Ovarian cancer (HCC)    Ovarian cancer on right (HCC) 06/17/2011   Peripheral neuropathy 08/26/2011   Chemotherapy-induced   Pulmonary embolism (HCC)    Sinusitis    Past Surgical History:  Procedure Laterality Date   ABDOMINAL HYSTERECTOMY  05/25/2011   [place] bs&o and cancer staging   CHOLECYSTECTOMY  1990   COLONOSCOPY N/A 09/06/2014   Procedure: COLONOSCOPY;  Surgeon: Malissa Hippo, MD;  Location: [facility];  Service: Endoscopy;  Laterality: N/A;  930 - moved to 9/2 @ 8:25   I & D EXTREMITY Left 12/13/2021   Procedure: IRRIGATION AND DEBRIDEMENT ANKLE/ LEG;  Surgeon: Sheral Apley, MD;  Location: [facility] OR;  Service: Orthopedics;  Laterality: Left;   INCISIONAL HERNIA REPAIR  10/15/2014   INCISIONAL HERNIA REPAIR  10/15/2014   Procedure: OPEN INCISIONAL HERNIA REPAIR WITH MESH AND MYOFASCIAL FLAPS;  Surgeon: Harriette Bouillon, MD;  Location: [facility] OR;  Service: General;;   IVC filter  05/24/11   PORT-A-CATH REMOVAL Right 11/21/2013   Procedure: MINOR REMOVAL PORT-A-CATH;  Surgeon: Dalia Heading, MD;  Location: [facility] ORS;  Service: General;  Laterality:  Right;   PORTACATH PLACEMENT  06/16/11   Done at [facility]   TONSILLECTOMY     age 67's   TOTAL HIP ARTHROPLASTY Right 08/26/2020   Procedure: TOTAL HIP ARTHROPLASTY ANTERIOR APPROACH;  Surgeon: Sheral Apley, MD;  Location: [facility] ORS;  Service: Orthopedics;  Laterality: Right;   TOTAL KNEE ARTHROPLASTY Left 07/20/2022   Procedure: TOTAL KNEE ARTHROPLASTY;  Surgeon: Sheral Apley, MD;  Location: [facility] ORS;  Service: Orthopedics;  Laterality: Left;   TUBAL LIGATION  1981   Patient Active Problem List   Diagnosis Date Noted   S/P TKR (total knee replacement) 07/21/2022   Status post left knee replacement 07/20/2022   Leg abscess 12/13/2021   Abscess of left leg 12/12/2021   Cellulitis 12/12/2021   Eczema 12/12/2021   Elevated serum creatinine 12/12/2021   S/P total right hip arthroplasty 08/26/2020   Vaginal atrophy 03/02/2017   History of ovarian cancer 03/02/2017   Chemotherapy-induced neuropathy (HCC) 03/31/2015   Incisional hernia, incarcerated 10/15/2014   Ventral hernia 07/15/2014   Ovarian cancer on right (HCC) 06/17/2011   Hypertension 06/17/2011   H/O hysterectomy with oophorectomy 06/17/2011    PCP: Dr. Assunta Found   REFERRING PROVIDER: Margarita Rana  D, MD  REFERRING DIAG:  Free Text Diagnosis  s/p L total knee replacement    THERAPY DIAG:  Pain in left knee Stiffness in left knee Edema Muscle weakness  Rationale for Evaluation and Treatment: Rehabilitation  ONSET DATE: 07/20/2022  SUBJECTIVE STATEMENT: Pt states she is doing so much better.  No real pain just stiffness.    PERTINENT HISTORY: Lt TKA 07/21/22.  As above  PAIN:  Are you having pain? Yes: NPRS scale: 3/10; worst 6 best 0 (with ice and elevated).  Pain location: Lt knee  Pain description: throbbing  Aggravating factors: activity Relieving factors: elevation   PRECAUTIONS: None     WEIGHT BEARING RESTRICTIONS: Yes WBAT  FALLS:  Has patient fallen in last 6 months?  No  LIVING ENVIRONMENT: Lives with: lives with their family Lives in: House/apartment Stairs: Yes: Internal: 4 steps; on right going up and External: 2 steps; on right going up Has following equipment at home: Single point cane and Walker - 2 wheeled  OCCUPATION: retired  PLOF: Independent  PATIENT GOALS: less pain, more mobility  NEXT MD VISIT: 08/04/22  OBJECTIVE:    PATIENT SURVEYS:  FOTO 35  evaluation;  62.7% 09/07/22  EDEMA:  normal for post op   LOWER EXTREMITY ROM:  Active ROM Right eval Left eval Left 08/17/22 Left 08/31/22 Left 09/07/22  Hip flexion       Hip extension       Hip abduction       Hip adduction       Hip internal rotation       Hip external rotation        Knee flexion  73  100 105 110A 120 AA  Knee extension  0     Ankle dorsiflexion       Ankle plantarflexion       Ankle inversion       Ankle eversion        (Blank rows = not tested)  LOWER EXTREMITY MMT:  MMT Right eval Left eval Right 09/07/22 Left 09/07/22  Hip flexion      Hip extension      Hip abduction      Hip adduction      Hip internal rotation      Hip external rotation      Knee flexion  3+ 4+ 4+  Knee extension  3+ 4+ 4+  Ankle dorsiflexion  3+ 4 4  Ankle plantarflexion      Ankle inversion      Ankle eversion       (Blank rows = not tested) FUNCTIONAL TESTS:  Evaluation: 30 seconds chair stand test: needs UE very slow to painful at this time  2 minute walk test: with walker 72 ft Single leg stance:unable B   09/07/22: 30 seconds chair stand test:  4X no UE with therapist CGA (was once, needing UE and painful)  2 minute walk test: SPC 145' (was RW 72 ft) Single leg stance:  2" bil (was unable Bil)   TODAY'S TREATMENT:  DATE:  09/09/22:   Bike seat 7 rocking then full revolutions 6 minutes Standing:    Heel raises 20X Lt knee flexion  20X with 1 UE assist Forward lunges without UE 2X10 onto 4" step bilaterally High march holds with 1 HHA 20X Vectors Lt LE 10X3" each with 1 UE assist Bil Forward step ups 6" 10X2 each with 1 UE assist Bil Lateral step ups 4" 10X2 each with 1 UE assist Stair negotiation 5RT reciprocally 4" step 1 UE ascending, 2UE descending  09/07/22:   Progress note Bike seat 7 rocking then full revolutions 6 minutes Standing:    Heel raises 20X Lt knee flexion stretch on 12" box 10X10" Lt knee flexion 20X with 1 UE assist Functional tests:  30 seconds chair stand test:  4X no UE with therapist CGA (was once, needing UE and painful)  2 minute walk test: SPC 145' (was RW 72 ft) Single leg stance:  2" bil (was unable Bil) FOTO 62.7% (was 35% evaluation)  09/02/22:   Bike seat 7 rocking then full revolutions 6 minutes Standing:    Heel raises 20X Lt knee flexion stretch on 12" box 10X10" Lt knee flexion 20X with 1 UE assist High march holds with 1 HHA 20X Vectors Lt LE 5X3" each with bil UE assist Forward step ups 6" 10X each with bil UE assist Lateral step ups 6" 10X each with bil UE assist  08/31/22:   Bike seat 6 rocking 5 minutes Standing:  Vectors Lt LE 5X3" each with bil UE assist  Heel raises 15X Lt knee flexion stretch on 12" box 10X10" Lt knee flexion 20X  High march holds with 1 HHA 10X Stairs negotiation working on reciprocal pattern, bil UE 4" steps 3RT STS with foam in chair without HHA 10X  AROM supine 105 degrees, AAROM 115  08/26/22:   Bike seat 6 rocking 5 minutes Standing:   Lt knee flexion stretch on 12" box 10X10" Lt knee flexion 20X  High march holds with 1 HHA 10X Stairs negotiation working on reciprocal pattern, bil UE 4" steps 3RT STS with foam in chair without HHA 10X  Lunges onto Lt LE no UE 4" step 10X  Vectors Lt LE 5X3" each with bil UE assist  08/24/22:   Bike seat 6 rocking 5 minutes Standing:   Lt knee flexion stretch on 12" box 10X10" Lt knee flexion  10X  4" forward step ups 10X with Rt UE only High march holds with 1 HHA 10X Stairs negotiation working on reciprocal pattern, bil UE 4" steps 3RT STS with foam in chair without HHA 10X  Gait with 1 SPC, cueing for step thru mechanics- SBA  Scar tissue massage and instructed   08/19/22:   Bike seat 6 rocking 5 minutes Standing:    Lt knee flexion stretch on 12" box 10X10" STS with elevated mat height and cueing for nose over toes without HHA Gait with 1 SPC, cueing for step thru mechanics- SBA  Supine:   Bridge 10x5"  Heel slides 10x Scar tissue massage and instructed  AROM 103 degrees  08/17/22 Bike seat 6 rocking 5 minutes Ambulation with SPC 50 feet X 2 Standing: heelraises 15X  Lt knee flexion stretch on 12" box 10X10"  Lt knee flexion 15X Stair negotiation 2RT lateral with bil UE assist (unable to do forward) Sit to stands with bil UE 5X Supine:  heelslides 5X  Lt SLR 10X Scar massage instruction AROM Lt LE 100 degrees  08/05/22  Bike seat 6 rocking 5 minutes Standing:: heelraises 10X  Lt knee flexion stretch on 6" box 10X10"  Lt knee flexion 10X Sit to stands with bil UE 5X Supine:  heelslides 5X  Lt SLR 10X  SAQ 10X5" AROM Lt LE 84 degrees   08/03/22 Goal review HEP review (ab bracing, GS, [facility]) Supine:  heel slides 10X5"  AROM 0-82  SLR Lt 10X Manual MFR soft tissue to lateral knee, edema below Seated:  LAQ  Heel slides  Self stretch using RT LE and rocking chair/swing instructions for home for flexion Sit to stands, unable to complete without UE assist  07/30/22 Evaluation   Seated Long Arc Quad 5reps - 3-5" hold - Seated Transversus Abdominis Bracing  -  10 reps - 3-5" hold - Supine Ankle Pumps  10 reps - 3-5" hold - Supine Gluteal Sets 10 reps - 3-5" hold - Supine Heel Slide  - 5 reps - 3-5" hold  PATIENT EDUCATION:  Education details: HEP Person educated: Patient Education method: Medical illustrator Education comprehension: returned  demonstration  HOME EXERCISE PROGRAM: Access Code: [email or web address] URL: [email or web address]  Date: 07/30/2022 Prepared by: Virgina Organ Exercises - Seated Long Arc Quad  - 1 x daily - 7 x weekly - 3 sets - 10 reps - 3-5" hold - Seated Transversus Abdominis Bracing  - 1 x daily - 7 x weekly - 3 sets - 10 reps - 3-5" hold - Supine Ankle Pumps  - 1 x daily - 7 x weekly - 3 sets - 10 reps - 3-5" hold - Supine Gluteal Sets  - 1 x daily - 7 x weekly - 3 sets - 10 reps - 3-5" hold - Supine Heel Slide  - 1 x daily - 7 x weekly - 3 sets - 10 reps - 3-5" hold  08/19/22: - Supine Bridge  - 2 x daily - 7 x weekly - 1 sets - 10 reps - 5" hold  ASSESSMENT:  CLINICAL IMPRESSION: Continued with focus on functional strengthening. Increased difficulty of vectors and step ups with reduction of UE assist. Reduced lateral step ups back to 4" as she was unable to control descent using mostly her UE with activity.  Pt required cues for general form and increasing hold times.  Pt able to complete all exercises today without rest break. Pt feels she may be ready for discharge following this certification period pending comfort and abilities present at that time.  Pt will continue to benefit from skilled therapy.  Evaluation: Patient is a 72 y.o. female  who was seen today for physical therapy evaluation and treatment for Lt Total knee replacement.  Evaluation demonstrates increased pain, increased edema, decreased mobility, decreased activity tolerance, decreased ROM, decreased balance and decreased strength.  Pt will continue to benefit from skilled PT to address these issues and maximize her functional ability.   OBJECTIVE IMPAIRMENTS: decreased activity tolerance, decreased balance, decreased mobility, difficulty walking, decreased ROM, decreased strength, and pain.   ACTIVITY LIMITATIONS: carrying, lifting, bending, sitting, standing, squatting, sleeping, stairs, dressing, and locomotion  level  PARTICIPATION LIMITATIONS: cleaning, laundry, driving, shopping, community activity, and yard work  PERSONAL FACTORS: Fitness are also affecting patient's functional outcome.   REHAB POTENTIAL: Good  CLINICAL DECISION MAKING: Stable/uncomplicated  EVALUATION COMPLEXITY: Low   GOALS: Goals reviewed with patient? No  SHORT TERM GOALS: Target date: 08/20/22 Pt to be I in HEP in order to decrease her pain to no greater than a 5 Baseline: Goal status: MET  2.  Pt ROM to be increased to  90 to allow improved comfort of sitting to be able to sit for 30 mintues  Baseline:  Goal status: MET  3.  PT strength to be increased by one grade to allow pt to be ambulating with a cane.in the home Baseline:  Goal status:  MET   LONG TERM GOALS: Target date: 09/10/22  Pt to be I in HEP in order to decrease her pain to no greater than a 2 Baseline:  Goal status: MET (stiffness no pain)  2.  Pt ROM to be increased to 120  to allow improved ability to squat to pick items off the floor  Baseline:  Goal status: PARTLY MET (AAROM now 120 degrees, however unable to pick items off floor)  3.  PT strength to be increased by one grade to allow pt to be ambulating with a cane in the community Baseline:  Goal status: MET  4.  Pt to be able to go up and down 4 steps in a reciprocal manner to ger up to her bedroom  Baseline:  Goal status: PARTLY MET  (can do 4" steps but not standard 6")     PLAN:  PT FREQUENCY: 2x/week  PT DURATION: 6 weeks  PLANNED INTERVENTIONS: Therapeutic exercises, Balance training, Gait training, Patient/Family education, Self Care, and Manual therapy  PLAN FOR NEXT SESSION: Progress ROM and strength as able. Push mobility and AROM of Lt knee. Increase functional independence.   Lurena Nida, PTA/CLT [facility] Ph: [phone] 09/09/2022, 12:08 PM

## 2022-09-14 ENCOUNTER — Ambulatory Visit (HOSPITAL_COMMUNITY): Payer: Medicare Other | Admitting: Physical Therapy

## 2022-09-14 DIAGNOSIS — M25662 Stiffness of left knee, not elsewhere classified: Secondary | ICD-10-CM

## 2022-09-14 DIAGNOSIS — M25562 Pain in left knee: Secondary | ICD-10-CM | POA: Diagnosis not present

## 2022-09-14 DIAGNOSIS — M6281 Muscle weakness (generalized): Secondary | ICD-10-CM

## 2022-09-14 NOTE — Therapy (Signed)
OUTPATIENT PHYSICAL THERAPY TREATMENT/Discharge     Patient Name: Sarah Walker MRN: [id number] DOB:[birth year], 72 y.o., female Today's Date: 09/14/2022 PHYSICAL THERAPY DISCHARGE SUMMARY  Visits from Start of Care: 12  Current functional level related to goals / functional outcomes: See below   Remaining deficits: Occasional pain    Education / Equipment: HEP   Patient agrees to discharge. Patient goals were met. Patient is being discharged due to meeting the stated rehab goals.  END OF SESSION:  PT End of Session - 09/14/22 1122     Visit Number 12    Number of Visits 12    Date for PT Re-Evaluation 09/10/22    Authorization Type [facility] medicare    Authorization - Number of Visits 12    Progress Note Due on Visit 12    PT Start Time 1114    PT Stop Time 1145    PT Time Calculation (min) 31 min    Activity Tolerance Patient tolerated treatment well    Behavior During Therapy WFL for tasks assessed/performed                Past Medical History:  Diagnosis Date   Abdominal hernia    Allergic rhinitis    Arthritis    Bone spur of other site    left foot   Dry eyes    Family history of kidney cancer    Family history of prostate cancer    Hypertension    Ovarian cancer (HCC)    Ovarian cancer on right (HCC) 06/17/2011   Peripheral neuropathy 08/26/2011   Chemotherapy-induced   Pulmonary embolism (HCC)    Sinusitis    Past Surgical History:  Procedure Laterality Date   ABDOMINAL HYSTERECTOMY  05/25/2011   [place] bs&o and cancer staging   CHOLECYSTECTOMY  1990   COLONOSCOPY N/A 09/06/2014   Procedure: COLONOSCOPY;  Surgeon: Malissa Hippo, MD;  [facility]: [facility];  Service: Endoscopy;  Laterality: N/A;  930 - moved to 9/2 @ 8:25   I & D EXTREMITY Left 12/13/2021   Procedure: IRRIGATION AND DEBRIDEMENT ANKLE/ LEG;  Surgeon: Sheral Apley, MD;  Location: [facility] OR;  Service: Orthopedics;  Laterality: Left;   INCISIONAL HERNIA REPAIR  10/15/2014    INCISIONAL HERNIA REPAIR  10/15/2014   Procedure: OPEN INCISIONAL HERNIA REPAIR WITH MESH AND MYOFASCIAL FLAPS;  Surgeon: Harriette Bouillon, MD;  Location: [facility] OR;  Service: General;;   IVC filter  05/24/11   PORT-A-CATH REMOVAL Right 11/21/2013   Procedure: MINOR REMOVAL PORT-A-CATH;  Surgeon: Dalia Heading, MD;  Location: [facility] ORS;  Service: General;  Laterality: Right;   PORTACATH PLACEMENT  06/16/11   Done at [facility]   TONSILLECTOMY     age 89's   TOTAL HIP ARTHROPLASTY Right 08/26/2020   Procedure: TOTAL HIP ARTHROPLASTY ANTERIOR APPROACH;  Surgeon: Sheral Apley, MD;  Location: [facility] ORS;  Service: Orthopedics;  Laterality: Right;   TOTAL KNEE ARTHROPLASTY Left 07/20/2022   Procedure: TOTAL KNEE ARTHROPLASTY;  Surgeon: Sheral Apley, MD;  Location: [facility] ORS;  Service: Orthopedics;  Laterality: Left;   TUBAL LIGATION  1981   Patient Active Problem List   Diagnosis Date Noted   S/P TKR (total knee replacement) 07/21/2022   Status post left knee replacement 07/20/2022   Leg abscess 12/13/2021   Abscess of left leg 12/12/2021   Cellulitis 12/12/2021   Eczema 12/12/2021   Elevated serum creatinine 12/12/2021   S/P total right hip arthroplasty 08/26/2020  Vaginal atrophy 03/02/2017   History of ovarian cancer 03/02/2017   Chemotherapy-induced neuropathy (HCC) 03/31/2015   Incisional hernia, incarcerated 10/15/2014   Ventral hernia 07/15/2014   Ovarian cancer on right [facility]) 06/17/2011   Hypertension 06/17/2011   H/O hysterectomy with oophorectomy 06/17/2011    PCP: Dr. Assunta Found   REFERRING PROVIDER: Sheral Apley, MD  REFERRING DIAG:  Free Text Diagnosis  s/p L total knee replacement    THERAPY DIAG:  Pain in left knee Stiffness in left knee Edema Muscle weakness  Rationale for Evaluation and Treatment: Rehabilitation  ONSET DATE: 07/20/2022  SUBJECTIVE STATEMENT: Only has pain now if she sits for prolonged time and goes to get up.  Otherwise, she is  doing well and most everything is back to normal.  Still uses [facility] for community ambulation and has to use HR to negotiate steps reciprocally. Pt feels she is ready for discharge.     PERTINENT HISTORY: Lt TKA 07/21/22.  As above  PAIN:  Are you having pain? NO  PRECAUTIONS: None     WEIGHT BEARING RESTRICTIONS: Yes WBAT  FALLS:  Has patient fallen in last 6 months? No  LIVING ENVIRONMENT: Lives with: lives with their family Lives in: House/apartment Stairs: Yes: Internal: 4 steps; on right going up and External: 2 steps; on right going up Has following equipment at home: Single point cane and Walker - 2 wheeled  OCCUPATION: retired  PLOF: Independent  PATIENT GOALS: less pain, more mobility  NEXT MD VISIT: 08/04/22  OBJECTIVE:    PATIENT SURVEYS:  FOTO 35  evaluation;  62.7% 09/07/22  EDEMA:  normal for post op   LOWER EXTREMITY ROM:  Active ROM Right eval Left eval Left 08/17/22 Left 08/31/22 Left 09/07/22 Left 09/14/22  Hip flexion        Hip extension        Hip abduction        Hip adduction        Hip internal rotation        Hip external rotation         Knee flexion  73  100 105 110A 120 AA 115A 120 AA  Knee extension  0      Ankle dorsiflexion        Ankle plantarflexion        Ankle inversion        Ankle eversion         (Blank rows = not tested)  LOWER EXTREMITY MMT:  MMT Right eval Left eval Right 09/07/22 Left 09/07/22  Hip flexion      Hip extension      Hip abduction      Hip adduction      Hip internal rotation      Hip external rotation      Knee flexion  3+ 4+ 4+  Knee extension  3+ 4+ 4+  Ankle dorsiflexion  3+ 4 4  Ankle plantarflexion      Ankle inversion      Ankle eversion       (Blank rows = not tested) FUNCTIONAL TESTS:  Evaluation: 30 seconds chair stand test: needs UE very slow to painful at this time  2 minute walk test: with walker 72 ft Single leg stance:unable B   09/07/22: 30 seconds chair stand test:  4X no  UE with therapist CGA (was once, needing UE and painful)  2 minute walk test: SPC 145' (was RW 72 ft) Single leg stance:  2" bil (was unable Bil)    09/14/22: 30 seconds chair stand test:  8X no UE with therapist CGA (was once, needing UE and painful)  2 minute walk test: SPC 175' (was RW 72 ft) Single leg stance:  2" bil (was unable Bil)   TODAY'S TREATMENT:                                                                                                                              DATE:   09/14/22 Bike seat 7 full revolutions 6 minutes Lt knee AROM 0-115 (120 AAROM) 09/14/22: 30 seconds chair stand test:  8X no UE with therapist CGA (was once, needing UE and painful)  2 minute walk test: SPC 175' (was RW 72 ft) Single leg stance:  2" bil (was unable Bil) FOTO Goal review 7" stair negotiation reciprocally with bil UE assist 2RT  09/09/22:   Bike seat 7 rocking then full revolutions 6 minutes Standing:    Heel raises 20X Lt knee flexion 20X with 1 UE assist Forward lunges without UE 2X10 onto 4" step bilaterally High march holds with 1 HHA 20X Vectors Lt LE 10X3" each with 1 UE assist Bil Forward step ups 6" 10X2 each with 1 UE assist Bil Lateral step ups 4" 10X2 each with 1 UE assist Stair negotiation 5RT reciprocally 4" step 1 UE ascending, 2UE descending  09/07/22:   Progress note Bike seat 7 rocking then full revolutions 6 minutes Standing:    Heel raises 20X Lt knee flexion stretch on 12" box 10X10" Lt knee flexion 20X with 1 UE assist Functional tests:  30 seconds chair stand test:  4X no UE with therapist CGA (was once, needing UE and painful)  2 minute walk test: SPC 145' (was RW 72 ft) Single leg stance:  2" bil (was unable Bil) FOTO 62.7% (was 35% evaluation)  09/02/22:   Bike seat 7 rocking then full revolutions 6 minutes Standing:    Heel raises 20X Lt knee flexion stretch on 12" box 10X10" Lt knee flexion 20X with 1 UE assist High march holds with 1 HHA  20X Vectors Lt LE 5X3" each with bil UE assist Forward step ups 6" 10X each with bil UE assist Lateral step ups 6" 10X each with bil UE assist  08/31/22:   Bike seat 6 rocking 5 minutes Standing:  Vectors Lt LE 5X3" each with bil UE assist  Heel raises 15X Lt knee flexion stretch on 12" box 10X10" Lt knee flexion 20X  High march holds with 1 HHA 10X Stairs negotiation working on reciprocal pattern, bil UE 4" steps 3RT STS with foam in chair without HHA 10X  AROM supine 105 degrees, AAROM 115  08/26/22:   Bike seat 6 rocking 5 minutes Standing:   Lt knee flexion stretch on 12" box 10X10" Lt knee flexion 20X  High march holds with 1 HHA 10X Stairs negotiation working on reciprocal pattern, bil UE 4"  steps 3RT STS with foam in chair without HHA 10X  Lunges onto Lt LE no UE 4" step 10X  Vectors Lt LE 5X3" each with bil UE assist   PATIENT EDUCATION:  Education details: HEP Person educated: Patient Education method: Medical illustrator Education comprehension: returned demonstration  HOME EXERCISE PROGRAM: Access Code: [email or web address] URL: [email or web address]  Date: 07/30/2022 Prepared by: Virgina Organ Exercises - Seated Long Arc Quad  - 1 x daily - 7 x weekly - 3 sets - 10 reps - 3-5" hold - Seated Transversus Abdominis Bracing  - 1 x daily - 7 x weekly - 3 sets - 10 reps - 3-5" hold - Supine Ankle Pumps  - 1 x daily - 7 x weekly - 3 sets - 10 reps - 3-5" hold - Supine Gluteal Sets  - 1 x daily - 7 x weekly - 3 sets - 10 reps - 3-5" hold - Supine Heel Slide  - 1 x daily - 7 x weekly - 3 sets - 10 reps - 3-5" hold  08/19/22: - Supine Bridge  - 2 x daily - 7 x weekly - 1 sets - 10 reps - 5" hold  ASSESSMENT:  CLINICAL IMPRESSION: Test measures completed and pt has met all goals at this point. A/AROM 120/115 flexion.  Pt feels confident with discharge at this time and reports no further needs.   Evaluation: Patient is a 72 y.o. female  who was seen  today for physical therapy evaluation and treatment for Lt Total knee replacement.  Evaluation demonstrates increased pain, increased edema, decreased mobility, decreased activity tolerance, decreased ROM, decreased balance and decreased strength.  Pt will continue to benefit from skilled PT to address these issues and maximize her functional ability.   OBJECTIVE IMPAIRMENTS: decreased activity tolerance, decreased balance, decreased mobility, difficulty walking, decreased ROM, decreased strength, and pain.   ACTIVITY LIMITATIONS: carrying, lifting, bending, sitting, standing, squatting, sleeping, stairs, dressing, and locomotion level  PARTICIPATION LIMITATIONS: cleaning, laundry, driving, shopping, community activity, and yard work  PERSONAL FACTORS: Fitness are also affecting patient's functional outcome.   REHAB POTENTIAL: Good  CLINICAL DECISION MAKING: Stable/uncomplicated  EVALUATION COMPLEXITY: Low   GOALS: Goals reviewed with patient? No  SHORT TERM GOALS: Target date: 08/20/22 Pt to be I in HEP in order to decrease her pain to no greater than a 5 Baseline: Goal status: MET  2.  Pt ROM to be increased to  90 to allow improved comfort of sitting to be able to sit for 30 mintues  Baseline:  Goal status: MET  3.  PT strength to be increased by one grade to allow pt to be ambulating with a cane.in the home Baseline:  Goal status:  MET   LONG TERM GOALS: Target date: 09/10/22  Pt to be I in HEP in order to decrease her pain to no greater than a 2 Baseline:  Goal status: MET (stiffness no pain)  2.  Pt ROM to be increased to 120  to allow improved ability to squat to pick items off the floor  Baseline:  Goal status: MET (AAROM now 120 degrees, AROM 115)  3.  PT strength to be increased by one grade to allow pt to be ambulating with a cane in the community Baseline:  Goal status: MET  4.  Pt to be able to go up and down 4 steps in a reciprocal manner to get up to her  bedroom  Baseline:  Goal status: MET    PLAN:  PT FREQUENCY: 2x/week  PT DURATION: 6 weeks  PLANNED INTERVENTIONS: Therapeutic exercises, Balance training, Gait training, Patient/Family education, Self Care, and Manual therapy  PLAN FOR NEXT SESSION: Discharge as all goals met.   Lurena Nida, PTA/CLT [facility] Ph: [phone] 09/14/2022, 12:43 PM  Virgina Organ PT

## 2022-09-16 ENCOUNTER — Encounter (HOSPITAL_COMMUNITY): Payer: Medicare Other | Admitting: Physical Therapy

## 2022-09-24 DIAGNOSIS — L89302 Pressure ulcer of unspecified buttock, stage 2: Secondary | ICD-10-CM | POA: Diagnosis not present

## 2022-10-05 DIAGNOSIS — L309 Dermatitis, unspecified: Secondary | ICD-10-CM | POA: Diagnosis not present

## 2022-10-05 DIAGNOSIS — Z79899 Other long term (current) drug therapy: Secondary | ICD-10-CM | POA: Diagnosis not present

## 2022-11-03 DIAGNOSIS — M1712 Unilateral primary osteoarthritis, left knee: Secondary | ICD-10-CM | POA: Diagnosis not present

## 2022-11-16 DIAGNOSIS — L4050 Arthropathic psoriasis, unspecified: Secondary | ICD-10-CM | POA: Diagnosis not present

## 2022-11-16 DIAGNOSIS — E7849 Other hyperlipidemia: Secondary | ICD-10-CM | POA: Diagnosis not present

## 2022-11-16 DIAGNOSIS — Z0001 Encounter for general adult medical examination with abnormal findings: Secondary | ICD-10-CM | POA: Diagnosis not present

## 2022-11-16 DIAGNOSIS — E782 Mixed hyperlipidemia: Secondary | ICD-10-CM | POA: Diagnosis not present

## 2022-11-16 DIAGNOSIS — I10 Essential (primary) hypertension: Secondary | ICD-10-CM | POA: Diagnosis not present

## 2022-12-07 DIAGNOSIS — L4050 Arthropathic psoriasis, unspecified: Secondary | ICD-10-CM | POA: Diagnosis not present

## 2022-12-14 DIAGNOSIS — J329 Chronic sinusitis, unspecified: Secondary | ICD-10-CM | POA: Diagnosis not present

## 2023-01-06 DIAGNOSIS — L400 Psoriasis vulgaris: Secondary | ICD-10-CM | POA: Diagnosis not present

## 2023-02-01 DIAGNOSIS — L4050 Arthropathic psoriasis, unspecified: Secondary | ICD-10-CM | POA: Diagnosis not present

## 2023-02-09 DIAGNOSIS — M79642 Pain in left hand: Secondary | ICD-10-CM | POA: Diagnosis not present

## 2023-02-09 DIAGNOSIS — R5383 Other fatigue: Secondary | ICD-10-CM | POA: Diagnosis not present

## 2023-02-09 DIAGNOSIS — M79641 Pain in right hand: Secondary | ICD-10-CM | POA: Diagnosis not present

## 2023-02-09 DIAGNOSIS — R1013 Epigastric pain: Secondary | ICD-10-CM | POA: Diagnosis not present

## 2023-02-09 DIAGNOSIS — L8940 Pressure ulcer of contiguous site of back, buttock and hip, unspecified stage: Secondary | ICD-10-CM | POA: Diagnosis not present

## 2023-02-09 DIAGNOSIS — L409 Psoriasis, unspecified: Secondary | ICD-10-CM | POA: Diagnosis not present

## 2023-02-09 DIAGNOSIS — R531 Weakness: Secondary | ICD-10-CM | POA: Diagnosis not present

## 2023-02-09 DIAGNOSIS — R0602 Shortness of breath: Secondary | ICD-10-CM | POA: Diagnosis not present

## 2023-02-09 DIAGNOSIS — E569 Vitamin deficiency, unspecified: Secondary | ICD-10-CM | POA: Diagnosis not present

## 2023-02-10 ENCOUNTER — Inpatient Hospital Stay (HOSPITAL_COMMUNITY)
Admission: EM | Admit: 2023-02-10 | Discharge: 2023-02-14 | DRG: 356 | Disposition: A | Discharge status: Home Health | Payer: Medicare Other | Attending: Internal Medicine | Admitting: Internal Medicine

## 2023-02-10 ENCOUNTER — Observation Stay (HOSPITAL_COMMUNITY): Payer: Medicare Other | Admitting: Certified Registered Nurse Anesthetist

## 2023-02-10 ENCOUNTER — Other Ambulatory Visit: Payer: Self-pay

## 2023-02-10 ENCOUNTER — Encounter (HOSPITAL_COMMUNITY)
Admission: EM | Disposition: A | Discharge status: Home Health | Payer: Self-pay | Source: Home / Self Care | Attending: Internal Medicine

## 2023-02-10 ENCOUNTER — Inpatient Hospital Stay (HOSPITAL_COMMUNITY): Payer: Medicare Other

## 2023-02-10 ENCOUNTER — Encounter (HOSPITAL_COMMUNITY): Payer: Self-pay

## 2023-02-10 DIAGNOSIS — Z8543 Personal history of malignant neoplasm of ovary: Secondary | ICD-10-CM

## 2023-02-10 DIAGNOSIS — K921 Melena: Secondary | ICD-10-CM

## 2023-02-10 DIAGNOSIS — E872 Acidosis, unspecified: Secondary | ICD-10-CM | POA: Diagnosis not present

## 2023-02-10 DIAGNOSIS — R791 Abnormal coagulation profile: Secondary | ICD-10-CM | POA: Diagnosis not present

## 2023-02-10 DIAGNOSIS — Z791 Long term (current) use of non-steroidal anti-inflammatories (NSAID): Secondary | ICD-10-CM

## 2023-02-10 DIAGNOSIS — Z7901 Long term (current) use of anticoagulants: Secondary | ICD-10-CM | POA: Diagnosis not present

## 2023-02-10 DIAGNOSIS — Z7982 Long term (current) use of aspirin: Secondary | ICD-10-CM | POA: Diagnosis not present

## 2023-02-10 DIAGNOSIS — R109 Unspecified abdominal pain: Secondary | ICD-10-CM | POA: Diagnosis not present

## 2023-02-10 DIAGNOSIS — E876 Hypokalemia: Secondary | ICD-10-CM | POA: Diagnosis not present

## 2023-02-10 DIAGNOSIS — T451X5A Adverse effect of antineoplastic and immunosuppressive drugs, initial encounter: Secondary | ICD-10-CM | POA: Diagnosis present

## 2023-02-10 DIAGNOSIS — I771 Stricture of artery: Secondary | ICD-10-CM | POA: Diagnosis not present

## 2023-02-10 DIAGNOSIS — R710 Precipitous drop in hematocrit: Secondary | ICD-10-CM | POA: Diagnosis not present

## 2023-02-10 DIAGNOSIS — J309 Allergic rhinitis, unspecified: Secondary | ICD-10-CM | POA: Diagnosis present

## 2023-02-10 DIAGNOSIS — E8809 Other disorders of plasma-protein metabolism, not elsewhere classified: Secondary | ICD-10-CM | POA: Diagnosis present

## 2023-02-10 DIAGNOSIS — E875 Hyperkalemia: Secondary | ICD-10-CM | POA: Diagnosis present

## 2023-02-10 DIAGNOSIS — I959 Hypotension, unspecified: Secondary | ICD-10-CM | POA: Diagnosis not present

## 2023-02-10 DIAGNOSIS — Z86711 Personal history of pulmonary embolism: Secondary | ICD-10-CM

## 2023-02-10 DIAGNOSIS — K264 Chronic or unspecified duodenal ulcer with hemorrhage: Secondary | ICD-10-CM

## 2023-02-10 DIAGNOSIS — Z6832 Body mass index (BMI) 32.0-32.9, adult: Secondary | ICD-10-CM | POA: Diagnosis not present

## 2023-02-10 DIAGNOSIS — Z9049 Acquired absence of other specified parts of digestive tract: Secondary | ICD-10-CM

## 2023-02-10 DIAGNOSIS — R58 Hemorrhage, not elsewhere classified: Secondary | ICD-10-CM | POA: Diagnosis not present

## 2023-02-10 DIAGNOSIS — Z881 Allergy status to other antibiotic agents status: Secondary | ICD-10-CM

## 2023-02-10 DIAGNOSIS — Z96641 Presence of right artificial hip joint: Secondary | ICD-10-CM | POA: Diagnosis present

## 2023-02-10 DIAGNOSIS — Z885 Allergy status to narcotic agent status: Secondary | ICD-10-CM | POA: Diagnosis not present

## 2023-02-10 DIAGNOSIS — I10 Essential (primary) hypertension: Secondary | ICD-10-CM | POA: Diagnosis present

## 2023-02-10 DIAGNOSIS — Z6831 Body mass index (BMI) 31.0-31.9, adult: Secondary | ICD-10-CM

## 2023-02-10 DIAGNOSIS — L89313 Pressure ulcer of right buttock, stage 3: Secondary | ICD-10-CM | POA: Diagnosis present

## 2023-02-10 DIAGNOSIS — M1990 Unspecified osteoarthritis, unspecified site: Secondary | ICD-10-CM

## 2023-02-10 DIAGNOSIS — E66811 Obesity, class 1: Secondary | ICD-10-CM | POA: Diagnosis present

## 2023-02-10 DIAGNOSIS — I700 Atherosclerosis of aorta: Secondary | ICD-10-CM | POA: Diagnosis not present

## 2023-02-10 DIAGNOSIS — Z79899 Other long term (current) drug therapy: Secondary | ICD-10-CM

## 2023-02-10 DIAGNOSIS — K269 Duodenal ulcer, unspecified as acute or chronic, without hemorrhage or perforation: Secondary | ICD-10-CM

## 2023-02-10 DIAGNOSIS — E538 Deficiency of other specified B group vitamins: Secondary | ICD-10-CM | POA: Diagnosis present

## 2023-02-10 DIAGNOSIS — D62 Acute posthemorrhagic anemia: Secondary | ICD-10-CM

## 2023-02-10 DIAGNOSIS — Z96652 Presence of left artificial knee joint: Secondary | ICD-10-CM | POA: Diagnosis present

## 2023-02-10 DIAGNOSIS — Z9221 Personal history of antineoplastic chemotherapy: Secondary | ICD-10-CM

## 2023-02-10 DIAGNOSIS — K439 Ventral hernia without obstruction or gangrene: Secondary | ICD-10-CM | POA: Diagnosis not present

## 2023-02-10 DIAGNOSIS — R6889 Other general symptoms and signs: Secondary | ICD-10-CM | POA: Diagnosis not present

## 2023-02-10 DIAGNOSIS — G629 Polyneuropathy, unspecified: Secondary | ICD-10-CM | POA: Diagnosis not present

## 2023-02-10 DIAGNOSIS — Z8051 Family history of malignant neoplasm of kidney: Secondary | ICD-10-CM

## 2023-02-10 DIAGNOSIS — Z905 Acquired absence of kidney: Secondary | ICD-10-CM

## 2023-02-10 DIAGNOSIS — G8929 Other chronic pain: Secondary | ICD-10-CM | POA: Diagnosis present

## 2023-02-10 DIAGNOSIS — Z90710 Acquired absence of both cervix and uterus: Secondary | ICD-10-CM

## 2023-02-10 DIAGNOSIS — F419 Anxiety disorder, unspecified: Secondary | ICD-10-CM | POA: Diagnosis present

## 2023-02-10 DIAGNOSIS — Z90722 Acquired absence of ovaries, bilateral: Secondary | ICD-10-CM

## 2023-02-10 DIAGNOSIS — R0902 Hypoxemia: Secondary | ICD-10-CM | POA: Diagnosis not present

## 2023-02-10 DIAGNOSIS — Z743 Need for continuous supervision: Secondary | ICD-10-CM | POA: Diagnosis not present

## 2023-02-10 DIAGNOSIS — R1013 Epigastric pain: Secondary | ICD-10-CM

## 2023-02-10 DIAGNOSIS — K922 Gastrointestinal hemorrhage, unspecified: Principal | ICD-10-CM

## 2023-02-10 HISTORY — PX: ESOPHAGOGASTRODUODENOSCOPY (EGD) WITH PROPOFOL: SHX5813

## 2023-02-10 HISTORY — PX: SCLEROTHERAPY: SHX6841

## 2023-02-10 LAB — COMPREHENSIVE METABOLIC PANEL
ALT: 10 U/L (ref 0–44)
AST: 22 U/L (ref 15–41)
Albumin: 2.9 g/dL — ABNORMAL LOW (ref 3.5–5.0)
Alkaline Phosphatase: 39 U/L (ref 38–126)
Anion gap: 11 (ref 5–15)
BUN: 58 mg/dL — ABNORMAL HIGH (ref 8–23)
CO2: 21 mmol/L — ABNORMAL LOW (ref 22–32)
Calcium: 8.5 mg/dL — ABNORMAL LOW (ref 8.9–10.3)
Chloride: 104 mmol/L (ref 98–111)
Creatinine, Ser: 1.17 mg/dL — ABNORMAL HIGH (ref 0.44–1.00)
GFR, Estimated: 49 mL/min — ABNORMAL LOW (ref >60)
Glucose, Bld: 103 mg/dL — ABNORMAL HIGH (ref 70–99)
Potassium: 5.3 mmol/L — ABNORMAL HIGH (ref 3.5–5.1)
Sodium: 136 mmol/L (ref 135–145)
Total Bilirubin: 0.9 mg/dL (ref 0.0–1.2)
Total Protein: 6.1 g/dL — ABNORMAL LOW (ref 6.5–8.1)

## 2023-02-10 LAB — PREPARE RBC (CROSSMATCH)

## 2023-02-10 LAB — IRON AND TIBC
Iron: 86 ug/dL (ref 28–170)
Saturation Ratios: 35 % — ABNORMAL HIGH (ref 10.4–31.8)
TIBC: 249 ug/dL — ABNORMAL LOW (ref 250–450)
UIBC: 163 ug/dL

## 2023-02-10 LAB — VITAMIN B12: Vitamin B-12: 142 pg/mL — ABNORMAL LOW (ref 180–914)

## 2023-02-10 LAB — POC OCCULT BLOOD, ED: Fecal Occult Bld: POSITIVE — AB

## 2023-02-10 LAB — CBC WITH DIFFERENTIAL/PLATELET
Abs Immature Granulocytes: 0.07 10*3/uL (ref 0.00–0.07)
Basophils Absolute: 0 10*3/uL (ref 0.0–0.1)
Basophils Relative: 0 %
Eosinophils Absolute: 0 10*3/uL (ref 0.0–0.5)
Eosinophils Relative: 0 %
HCT: 24.5 % — ABNORMAL LOW (ref 36.0–46.0)
Hemoglobin: 7.6 g/dL — ABNORMAL LOW (ref 12.0–15.0)
Immature Granulocytes: 1 %
Lymphocytes Relative: 11 %
Lymphs Abs: 1 10*3/uL (ref 0.7–4.0)
MCH: 31 pg (ref 26.0–34.0)
MCHC: 31 g/dL (ref 30.0–36.0)
MCV: 100 fL (ref 80.0–100.0)
Monocytes Absolute: 0.4 10*3/uL (ref 0.1–1.0)
Monocytes Relative: 4 %
Neutro Abs: 7.9 10*3/uL — ABNORMAL HIGH (ref 1.7–7.7)
Neutrophils Relative %: 84 %
Platelets: 314 10*3/uL (ref 150–400)
RBC: 2.45 MIL/uL — ABNORMAL LOW (ref 3.87–5.11)
RDW: 15.6 % — ABNORMAL HIGH (ref 11.5–15.5)
WBC: 9.4 10*3/uL (ref 4.0–10.5)
nRBC: 0 % (ref 0.0–0.2)

## 2023-02-10 LAB — FERRITIN: Ferritin: 74 ng/mL (ref 11–307)

## 2023-02-10 LAB — RETICULOCYTES
Immature Retic Fract: 18.2 % — ABNORMAL HIGH (ref 2.3–15.9)
RBC.: 2.54 MIL/uL — ABNORMAL LOW (ref 3.87–5.11)
Retic Count, Absolute: 34 10*3/uL (ref 19.0–186.0)
Retic Ct Pct: 1.3 % (ref 0.4–3.1)

## 2023-02-10 LAB — FOLATE: Folate: 8.6 ng/mL (ref >5.9)

## 2023-02-10 LAB — MRSA NEXT GEN BY PCR, NASAL: MRSA by PCR Next Gen: NOT DETECTED

## 2023-02-10 LAB — HEMOGLOBIN AND HEMATOCRIT, BLOOD
HCT: 19.6 % — ABNORMAL LOW (ref 36.0–46.0)
Hemoglobin: 6.1 g/dL — CL (ref 12.0–15.0)

## 2023-02-10 SURGERY — ESOPHAGOGASTRODUODENOSCOPY (EGD) WITH PROPOFOL
Anesthesia: General

## 2023-02-10 MED ORDER — LIDOCAINE HCL (PF) 2 % IJ SOLN
INTRAMUSCULAR | Status: DC | PRN
  Administered 2023-02-10: 50 mg via INTRADERMAL

## 2023-02-10 MED ORDER — PROPOFOL 10 MG/ML IV BOLUS
INTRAVENOUS | Status: DC | PRN
  Administered 2023-02-10: 180 ug/kg/min via INTRAVENOUS
  Administered 2023-02-10: 80 mg via INTRAVENOUS

## 2023-02-10 MED ORDER — PHENYLEPHRINE 80 MCG/ML (10ML) SYRINGE FOR IV PUSH (FOR BLOOD PRESSURE SUPPORT)
PREFILLED_SYRINGE | INTRAVENOUS | Status: DC | PRN
  Administered 2023-02-10: 80 ug via INTRAVENOUS

## 2023-02-10 MED ORDER — FLUTICASONE PROPIONATE 50 MCG/ACT NA SUSP: 1.0000 | Freq: Every day | NASAL | Status: DC | PRN

## 2023-02-10 MED ORDER — ACETAMINOPHEN 325 MG PO TABS: 650.0000 mg | ORAL_TABLET | Freq: Four times a day (QID) | ORAL | Status: DC | PRN

## 2023-02-10 MED ORDER — ONDANSETRON HCL 4 MG/2ML IJ SOLN
4.0000 mg | Freq: Four times a day (QID) | INTRAMUSCULAR | Status: DC | PRN
  Administered 2023-02-12: 4 mg via INTRAVENOUS
  Filled 2023-02-10: qty 2

## 2023-02-10 MED ORDER — PHENYLEPHRINE 80 MCG/ML (10ML) SYRINGE FOR IV PUSH (FOR BLOOD PRESSURE SUPPORT)
PREFILLED_SYRINGE | INTRAVENOUS | Status: AC
  Filled 2023-02-10: qty 10

## 2023-02-10 MED ORDER — PROPOFOL 500 MG/50ML IV EMUL
INTRAVENOUS | Status: AC
Start: 2023-02-10 — End: ?
  Filled 2023-02-10: qty 100

## 2023-02-10 MED ORDER — SODIUM CHLORIDE 0.9% IV SOLUTION: Freq: Once | INTRAVENOUS | Status: AC

## 2023-02-10 MED ORDER — ACETAMINOPHEN 650 MG RE SUPP: 650.0000 mg | Freq: Four times a day (QID) | RECTAL | Status: DC | PRN

## 2023-02-10 MED ORDER — SODIUM CHLORIDE 0.9 % IV SOLN: INTRAVENOUS | Status: AC

## 2023-02-10 MED ORDER — PANTOPRAZOLE SODIUM 40 MG IV SOLR
40.0000 mg | INTRAVENOUS | Status: AC
Start: 2023-02-10 — End: 2023-02-10
  Filled 2023-02-10: qty 10

## 2023-02-10 MED ORDER — PANTOPRAZOLE SODIUM 40 MG IV SOLR
40.0000 mg | Freq: Four times a day (QID) | INTRAVENOUS | Status: DC
  Filled 2023-02-10: qty 10

## 2023-02-10 MED ORDER — PANTOPRAZOLE SODIUM 40 MG IV SOLR
40.0000 mg | Freq: Once | INTRAVENOUS | Status: AC
Start: 2023-02-10 — End: 2023-02-10
  Administered 2023-02-10: 40 mg via INTRAVENOUS
  Filled 2023-02-10: qty 10

## 2023-02-10 MED ORDER — ALPRAZOLAM 0.5 MG PO TABS
0.5000 mg | ORAL_TABLET | Freq: Every day | ORAL | Status: DC
  Administered 2023-02-10 – 2023-02-13 (×4): 0.5 mg via ORAL
  Filled 2023-02-10 (×4): qty 1

## 2023-02-10 MED ORDER — OXYCODONE HCL 5 MG PO TABS
5.0000 mg | ORAL_TABLET | ORAL | Status: DC | PRN
  Administered 2023-02-12 – 2023-02-13 (×2): 5 mg via ORAL
  Filled 2023-02-10 (×3): qty 1

## 2023-02-10 MED ORDER — ALBUTEROL SULFATE (2.5 MG/3ML) 0.083% IN NEBU: 2.5000 mg | INHALATION_SOLUTION | RESPIRATORY_TRACT | Status: DC | PRN

## 2023-02-10 MED ORDER — CHLORHEXIDINE GLUCONATE CLOTH 2 % EX PADS
6.0000 | MEDICATED_PAD | Freq: Every day | CUTANEOUS | Status: DC
  Administered 2023-02-11 – 2023-02-12 (×3): 6 via TOPICAL

## 2023-02-10 MED ORDER — SODIUM CHLORIDE (PF) 0.9 % IJ SOLN
PREFILLED_SYRINGE | INTRAMUSCULAR | Status: DC | PRN
  Administered 2023-02-10: 1 mL

## 2023-02-10 MED ORDER — LACTATED RINGERS IV SOLN: INTRAVENOUS | Status: DC

## 2023-02-10 MED ORDER — PANTOPRAZOLE SODIUM 40 MG IV SOLR
40.0000 mg | Freq: Two times a day (BID) | INTRAVENOUS | Status: DC
  Filled 2023-02-10: qty 10

## 2023-02-10 MED ORDER — IOHEXOL 350 MG/ML SOLN
100.0000 mL | Freq: Once | INTRAVENOUS | Status: AC | PRN
  Administered 2023-02-10: 100 mL via INTRAVENOUS

## 2023-02-10 MED ORDER — PANTOPRAZOLE SODIUM 40 MG IV SOLR
40.0000 mg | Freq: Two times a day (BID) | INTRAVENOUS | Status: DC
  Administered 2023-02-10 – 2023-02-14 (×9): 40 mg via INTRAVENOUS
  Filled 2023-02-10 (×9): qty 10

## 2023-02-10 MED ORDER — ONDANSETRON HCL 4 MG PO TABS: 4.0000 mg | ORAL_TABLET | Freq: Four times a day (QID) | ORAL | Status: DC | PRN

## 2023-02-10 MED ORDER — EPINEPHRINE 1 MG/10ML IJ SOSY
PREFILLED_SYRINGE | INTRAMUSCULAR | Status: AC
  Filled 2023-02-10: qty 10

## 2023-02-10 NOTE — Op Note (Signed)
 [facility] Patient Name: Sarah Walker Procedure Date: 02/10/2023 12:55 PM MRN: [id number] Date of Birth: Jan 01, 1951 Attending MD: Deatrice Dine , MD, [id number] CSN: [id number] Age: 73 Admit Type: Outpatient Procedure:                Upper GI endoscopy Indications:              Acute post hemorrhagic anemia, Melena Providers:                Deatrice Dine, MD, Crystal Page, Daphne Mulch                            Technician, Technician Referring MD:              Medicines:                Monitored Anesthesia Care Complications:            No immediate complications. Estimated Blood Loss:     Estimated blood loss: none. Procedure:                Pre-Anesthesia Assessment:                           - Prior to the procedure, a History and Physical                            was performed, and patient medications and                            allergies were reviewed. The patient's tolerance of                            previous anesthesia was also reviewed. The risks                            and benefits of the procedure and the sedation                            options and risks were discussed with the patient.                            All questions were answered, and informed consent                            was obtained. Prior Anticoagulants: The patient has                            taken no anticoagulant or antiplatelet agents. ASA                            Grade Assessment: III - A patient with severe                            systemic disease. After reviewing the risks and  benefits, the patient was deemed in satisfactory                            condition to undergo the procedure.                           After obtaining informed consent, the endoscope was                            passed under direct vision. Throughout the                            procedure, the patient's blood pressure, pulse, and                             oxygen saturations were monitored continuously. The                            GIF-H190 ([id number]) scope was introduced through the                            mouth, and advanced to the second part of duodenum.                            The upper GI endoscopy was accomplished without                            difficulty. The patient tolerated the procedure                            well. Scope In: 1:11:43 PM Scope Out: 1:21:23 PM Total Procedure Duration: 0 hours 9 minutes 40 seconds  Findings:      The examined esophagus was normal.      Clotted blood was found in the entire examined stomach. Lavage of the       area was performed using a large amount of sterile water , resulting in       incomplete clearance with continued poor visualization.      One non-obstructing cratered duodenal ulcer with adherent clot was found       in the duodenal bulb. The lesion was 20 mm in largest dimension.       Perforation of the bowel wall cannot be ruled out. Area was successfully       injected with 1 mL of a 0.1 mg/mL solution of epinephrine  for hemostasis. Impression:               - Normal esophagus.                           - Large amount of Clotted blood in the entire                            stomach, unable to be suctioned hence inadequate                            exam                           -  Non-obstructing large duodenal ulcer with large                            adherent clot; unroofing was not attempted.                            Perforation of the bowel wall cannot be ruled out.                            Injected. This is either large peptic ulcer vs mass                            vs walled off perforation                           - No specimens collected. Moderate Sedation:      Per Anesthesia Care Recommendation:           Keep NPO                           STAT CT Abdomen and pelvis with IV contrast                           Change PPI IV to continous drip                            ICU monitoring given high risk of rebleeding                           Maintain 2 large bore IV lines                           Transfuse to Keep HBG>7                           CBC q 8 hours                           Spoke with patient daughter Sarah Walker : [phone]                            [phone])                           Depending on clinical course and CT finding,                            complete exam with repeat Upper endoscopy may be                            performed tomorrow after given erythromycin Procedure Code(s):        --- Professional ---                           43255, Esophagogastroduodenoscopy, flexible,  transoral; with control of bleeding, any method Diagnosis Code(s):        --- Professional ---                           K92.2, Gastrointestinal hemorrhage, unspecified                           K26.4, Chronic or unspecified duodenal ulcer with                            hemorrhage                           D62, Acute posthemorrhagic anemia                           K92.1, Melena (includes Hematochezia) CPT copyright 2022 American Medical Association. All rights reserved. The codes documented in this report are preliminary and upon coder review may  be revised to meet current compliance requirements. Deatrice Dine, MD Deatrice Dine, MD 02/10/2023 1:35:18 PM This report has been signed electronically. Number of Addenda: 0

## 2023-02-10 NOTE — Anesthesia Preprocedure Evaluation (Signed)
 Anesthesia Evaluation  Patient identified by MRN, date of birth, ID band Patient awake    Reviewed: Allergy & Precautions, H&P , NPO status , Patient's Chart, lab work & pertinent test results, reviewed documented beta blocker date and time   Airway Mallampati: II  TM Distance: >3 FB Neck ROM: full    Dental no notable dental hx. (+) Dental Advisory Given, Teeth Intact   Pulmonary PE   Pulmonary exam normal breath sounds clear to auscultation       Cardiovascular Exercise Tolerance: Good hypertension, negative cardio ROS Normal cardiovascular exam Rhythm:regular Rate:Normal     Neuro/Psych  Neuromuscular disease  negative psych ROS   GI/Hepatic negative GI ROS, Neg liver ROS,,,  Endo/Other  negative endocrine ROS    Renal/GU negative Renal ROS  negative genitourinary   Musculoskeletal  (+) Arthritis , Osteoarthritis,    Abdominal   Peds  Hematology  (+) Blood dyscrasia, anemia Hgb 7.6   Anesthesia Other Findings Ovarian cancer  Reproductive/Obstetrics negative OB ROS                              Anesthesia Physical Anesthesia Plan  ASA: 3 and emergent  Anesthesia Plan: General   Post-op Pain Management: Minimal or no pain anticipated   Induction: Intravenous  PONV Risk Score and Plan: Propofol infusion  Airway Management Planned: Nasal Cannula and Natural Airway  Additional Equipment: None  Intra-op Plan:   Post-operative Plan:   Informed Consent: I have reviewed the patients History and Physical, chart, labs and discussed the procedure including the risks, benefits and alternatives for the proposed anesthesia with the patient or authorized representative who has indicated his/her understanding and acceptance.     Dental Advisory Given  Plan Discussed with: CRNA  Anesthesia Plan Comments:         Anesthesia Quick Evaluation

## 2023-02-10 NOTE — Anesthesia Postprocedure Evaluation (Signed)
 Anesthesia Post Note  Patient: Sarah Walker  Procedure(s) Performed: ESOPHAGOGASTRODUODENOSCOPY (EGD) WITH PROPOFOL  SCLEROTHERAPY  Patient location during evaluation: PACU Anesthesia Type: General Level of consciousness: awake and alert Pain management: pain level controlled Vital Signs Assessment: post-procedure vital signs reviewed and stable Respiratory status: spontaneous breathing, nonlabored ventilation, respiratory function stable and patient connected to nasal cannula oxygen Cardiovascular status: blood pressure returned to baseline and stable Postop Assessment: no apparent nausea or vomiting Anesthetic complications: no   There were no known notable events for this encounter.   Last Vitals:  Vitals:   02/10/23 1326 02/10/23 1330  BP: (!) 168/95 (!) 146/76  Pulse: 70   Resp: 20 18  Temp: 36.7 C 36.7 C  SpO2: 98% 98%    Last Pain:  Vitals:   02/10/23 1330  TempSrc:   PainSc: 0-No pain                 Gusta Marksberry L Camron Essman

## 2023-02-10 NOTE — ED Triage Notes (Signed)
 Pt arrived via [facility] from home c/o hematochezia X 2 days. Pt reports stools are black/tarry. Pt endorses intermittent lower back and abdominal pain.

## 2023-02-10 NOTE — Progress Notes (Signed)
 Patient underwent EGD under propofol  sedation.  Tolerated the procedure adequately.   FINDINGS:  - Normal esophagus.   - Large amount of Clotted blood in the entire stomach, unable to be suctioned hence inadequate exam   - Non- obstructing large duodenal ulcer with large adherent clot; unroofing was not attempted. Perforation of the bowel wall cannot be ruled out. Injected. This is either large peptic ulcer vs mass vs walled off perforation   - No specimens collected.    RECOMMENDATIONS  Keep NPO   -STAT CT Abdomen and pelvis with IV contrast   -Change PPI IV to continous drip   -ICU monitoring given high risk of rebleeding   -Maintain 2 large bore IV lines   -Transfuse to Keep HBG> 7   -CBC q 6 hours   -Spoke with patient daughter ETTER LEOTIS COUPE : [phone])   -Depending on clinical course and CT finding, complete exam with repeat Upper endoscopy may be performed tomorrow after given erythromycin  -If any further bleeding or hemodynamic instability is encountered consult IR for possible emobolization.   Demeka Sutter Faizan Ronita Hargreaves, MD Gastroenterology and Hepatology [facility]

## 2023-02-10 NOTE — Interval H&P Note (Signed)
 History and Physical Interval Note:  02/10/2023 1:09 PM  Sarah Walker  has presented today for surgery, with the diagnosis of Melena, upper Gi bleed.  The various methods of treatment have been discussed with the patient and family. After consideration of risks, benefits and other options for treatment, the patient has consented to  Procedure(s): ESOPHAGOGASTRODUODENOSCOPY (EGD) WITH PROPOFOL  (N/A) as a surgical intervention.  The patient's history has been reviewed, patient examined, no change in status, stable for surgery.  I have reviewed the patient's chart and labs.  Questions were answered to the patient's satisfaction.     Deatrice FALCON Keelan Pomerleau

## 2023-02-10 NOTE — H&P (Addendum)
 History and Physical    Patient: Sarah Walker FMW:[id number] DOB: [birth year] DOA: 02/10/2023 DOS: the patient was seen and examined on 02/10/2023 PCP: Marvine Rush, MD  Patient coming from: Home  Chief Complaint: Dark stools, generalized weakness, fatigue Chief Complaint  Patient presents with   Hematochezia   HPI: Sarah Walker is a 73 y.o. female with medical history significant of HTN, anxiety, chronic knee pain, history of ovarian cancer s/p hysterectomy/oophorectomy and chemotherapy 2013 in remission, neuropathy, provoked pulmonary embolism 2013 in which she completed anticoagulation; and currently being worked up by rheumatology outpatient for concern of psoriatic arthritis who presented to [facility] ED on 2/6 via EMS with dark stools, epigastric abdominal pain.  Onset yesterday, thought that her stool was black due to taking hydrocodone  and a stool softener.  Pain localized in her epigastric region and reports some radiation towards her back.  Patient denies NSAID abuse, no BC or Goody powder.  Does utilize a baby aspirin  at baseline.  Also endorses generalized weakness, fatigue.  No longer on anticoagulation since treatment for pulmonary embolism in 2013.  Denies headache, no dizziness, no chest pain, no palpitations, no shortness of breath, no fever/chills/night sweats, no nausea/vomiting/diarrhea, no focal weakness, no paresthesias.    In the ED, temperature 98.0 F, HR 109, RR 18, BP 199/76, SpO2 99% on room air.  WBC 9.4, hemoglobin 7.6, platelet count 314, MCV 100.0.  Sodium 136, potassium 5.3, chloride 104, CO2 21, glucose 103, BUN 58, creatinine 1.17.  AST 22, ALT 10, total bilirubin 0.9.  FOBT positive.  GI was consulted.  Patient was given Protonix  40 mg IV x 1.  EDP consulted [facility] for admission for further evaluation and management of concern for upper GI bleed.   Review of Systems: As mentioned in the history of present illness. All other systems reviewed and are  negative. Past Medical History:  Diagnosis Date   Abdominal hernia    Allergic rhinitis    Arthritis    Bone spur of other site    left foot   Dry eyes    Family history of kidney cancer    Family history of prostate cancer    Hypertension    Ovarian cancer (HCC)    Ovarian cancer on right (HCC) 06/17/2011   Peripheral neuropathy 08/26/2011   Chemotherapy-induced   Pulmonary embolism (HCC)    Sinusitis    Past Surgical History:  Procedure Laterality Date   ABDOMINAL HYSTERECTOMY  05/25/2011   [place] [facility] and cancer staging   CHOLECYSTECTOMY  01/05/1988   COLONOSCOPY N/A 09/06/2014   Procedure: COLONOSCOPY;  Surgeon: Claudis RAYMOND Rivet, MD;  Location: [facility] SUITE;  Service: Endoscopy;  Laterality: N/A;  930 - moved to 9/2 @ 8:25   I & D EXTREMITY Left 12/13/2021   Procedure: IRRIGATION AND DEBRIDEMENT ANKLE/ LEG;  Surgeon: Beverley Evalene BIRCH, MD;  Location: [facility] OR;  Service: Orthopedics;  Laterality: Left;   INCISIONAL HERNIA REPAIR  10/15/2014   INCISIONAL HERNIA REPAIR  10/15/2014   Procedure: OPEN INCISIONAL HERNIA REPAIR WITH MESH AND MYOFASCIAL FLAPS;  Surgeon: Debby Shipper, MD;  Location: [facility] OR;  Service: General;;   IVC filter  05/24/2011   PORT-A-CATH REMOVAL Right 11/21/2013   Procedure: MINOR REMOVAL PORT-A-CATH;  Surgeon: Oneil DELENA Budge, MD;  Location: [facility] ORS;  Service: General;  Laterality: Right;   PORTACATH PLACEMENT  06/16/2011   Done at [facility]   ROTATOR CUFF REPAIR Right    TONSILLECTOMY  age 85's   TOTAL HIP ARTHROPLASTY Right 08/26/2020   Procedure: TOTAL HIP ARTHROPLASTY ANTERIOR APPROACH;  Surgeon: Beverley Evalene BIRCH, MD;  Location: [facility] ORS;  Service: Orthopedics;  Laterality: Right;   TOTAL KNEE ARTHROPLASTY Left 07/20/2022   Procedure: TOTAL KNEE ARTHROPLASTY;  Surgeon: Beverley Evalene BIRCH, MD;  Location: [facility] ORS;  Service: Orthopedics;  Laterality: Left;   TUBAL LIGATION  01/05/1979   Social History:  reports that she has never smoked. She has  never used smokeless tobacco. She reports that she does not drink alcohol and does not use drugs.  Allergies  Allergen Reactions   Codeine Nausea And Vomiting    Not right away after a couple days of taking   Erythromycin Itching and Other (See Comments)    Family History  Problem Relation Age of Onset   Stroke Mother    Cervical cancer Mother 69   Kidney cancer Father 1   Aneurysm Paternal Grandmother        24s   Hodgkin's lymphoma Maternal Uncle 33   Prostate cancer Paternal Uncle    Throat cancer Paternal Uncle    Cancer Cousin        2 female maternal first cousins with unknown cancer   Colon cancer Neg Hx    Stomach cancer Neg Hx     Prior to Admission medications   Medication Sig Start Date End Date Taking? Authorizing Provider  acitretin (SORIATANE) 25 MG capsule Take 25 mg by mouth daily. 06/17/22  Yes [provider]  ALPRAZolam  (XANAX ) 0.5 MG tablet Take 0.5 mg by mouth at bedtime. 01/13/23  Yes [provider]  aspirin  EC 81 MG tablet Take 81 mg by mouth daily. Swallow whole.   Yes [provider]  azelastine (OPTIVAR) 0.05 % ophthalmic solution Place 1 drop into both eyes daily as needed (rag weed). 07/02/14  Yes [provider]  bismuth  subsalicylate (PEPTO BISMOL) 262 MG/15ML suspension Take 30 mLs by mouth every 6 (six) hours as needed for indigestion.   Yes [provider]  fexofenadine (ALLEGRA) 180 MG tablet Take 180 mg by mouth daily as needed for allergies or rhinitis.   Yes [provider]  fluticasone  (FLONASE ) 50 MCG/ACT nasal spray Place 1 spray into both nostrils daily as needed for allergies or rhinitis.   Yes [provider]  HYDROcodone -acetaminophen  (NORCO/VICODIN) 5-325 MG tablet Take 1-2 tablets by mouth every 6 (six) hours as needed for moderate pain (pain score 4-6).   Yes [provider]  KLOR-CON  M20 20 MEQ tablet TAKE 1 TABLET BY MOUTH EVERY DAY Patient taking differently: Take  10 mEq by mouth daily. 11/30/16  Yes Letha Truman ORN, NP  meloxicam (MOBIC) 15 MG tablet Take 15 mg by mouth daily as needed for pain. PLEASE SEE ATTACHED FOR DETAILED DIRECTIONS 09/13/21  Yes [provider]  mupirocin  ointment (BACTROBAN ) 2 % Apply 1 Application topically daily as needed. 01/13/23  Yes [provider]  ondansetron  (ZOFRAN ) 4 MG tablet Take 1 tablet (4 mg total) by mouth every 8 (eight) hours as needed for nausea or vomiting. 07/22/22  Yes Brown, Blaine K, PA-C  sennosides-docusate sodium  (SENOKOT-S) 8.6-50 MG tablet Take 2 tablets by mouth daily. 07/22/22  Yes Brown, Blaine K, PA-C  valsartan -hydrochlorothiazide  (DIOVAN -HCT) 320-12.5 MG tablet Take 1 tablet by mouth daily. 05/21/22  Yes [provider]  acetaminophen  (TYLENOL ) 500 MG tablet Take 1,000 mg by mouth every 8 (eight) hours as needed for moderate pain.    [provider]    Physical Exam: Vitals:   02/10/23 0939 02/10/23 0941 02/10/23 1045  BP:  99/76 130/69  Pulse:  (!) 109 91  Resp:  18 16  Temp:  98 F (36.7 C)   TempSrc:  Oral   SpO2:  99% 98%  Weight: 89.8 kg    Height: 5' (1.524 m)      GEN: 73 yo female in NAD, alert and oriented x 3, obese, pale in complexion HEENT: NCAT, PERRL, EOMI, sclera clear, MMM PULM: CTAB w/o wheezes/crackles, normal respiratory effort, room air CV: RRR w/o M/G/R GI: abd soft, mild epigastric TTP, nondistended, NABS, no R/G/M MSK: no peripheral edema, muscle strength globally intact 5/5 bilateral upper/lower extremities NEURO: CN II-XII intact, no focal deficits, sensation to light touch intact PSYCH: normal mood/affect Integumentary: Noted mild slight patchy lesions to scalp, bilateral elbows, otherwise no other concerning rashes/lesions/wounds noted on exposed skin surfaces dry/intact   Assessment and Plan:  Upper GI bleed Patient presenting with dark tarry stools for 1 day.  On aspirin  81 mg p.o. daily outpatient.  No other  antiplatelets or anticoagulants.  Denies NSAID use including Goody or BC powder.  Hemoglobin 7.4 on admission, last 10.7 on 07/22/2022.  BUN elevated.  Consistent with upper GI bleed. -- Admit to observation status, telemetry bed -- GI following, appreciate assistance -- Check anemia panel -- H&H every 6 hours -- Transfuse for hemoglobin less than 7.0 -- Protonix  40 mg IV every 12 hours -- N.p.o., plan to EGD today  Addendum 1352: Received message from GI after EGD today.  Large amount of clotted blood entire stomach, nonobstructing large duodenal ulcer with large adherent clot unroofing not attempted, perforation of bowel wall cannot be ruled out, injected.  Concern for large peptic ulcer versus mass versus walled off perforation.  -- CT angiogram GI study stat: Pending -- Protonix  80 mg IV x 1 followed by 40 mg IV q6h for 72 hours followed by 40 mg IV q12h -- Maintain 2 large-bore IV lines -- Continue to monitor hemoglobin every 6 hours -- Transfer to ICU for close monitoring, if further bleeding or hemodynamic instability will need IR for possible embolization versus general surgery for evaluation  Addendum 1652: CT angiogram GI bleed study with high density focus anterior to gastroduodenal branch celiac artery posterior to the stomach concerning for possible extravasation.  Discussed with interventional radiology, Dr. Jennefer who reviewed imaging.  Dr. Jennefer does not believe there is any concerning findings to suggest extravasation at this time.  Given patient is hemodynamically stable, without hemoptysis recommends conservative treatment at this time.  But does decompensate could consider prophylactic embolization of the GDA.  Addendum 1726: Hemoglobin 6.1, orders placed for transfusion 2 unit PRBC.  Patient remains hemodynamically stable.  Transferring to the ICU for closer monitoring.  Hyperkalemia Potassium slightly elevated 5.3. -- IV fluid hydration -- Monitor on telemetry --  Repeat BMP in the a.m.  Essential hypertension On valsartan -HCTZ 320-12.5 mg p.o. daily at baseline.  BP initially soft on admission. -- Hold antihypertensives for now -- Hydralazine  5 mg IV q6h PRN SBP >170 -- Monitor BP closely  Chronic knee pain -- Tylenol /oxycodone  as needed -- Avoid NSAIDs as above  History of ovarian cancer  s/p hysterectomy/oophorectomy and chemotherapy 2013 in remission,   Hx provoked pulmonary embolism 2013 No longer on anticoagulation  Concern for psoriatic arthritis Currently followed by rheumatology outpatient, seen in clinic 02/09/2023 with workup underway.    Advance Care Planning:  Code Status: Full Code   Consults: Gastroenterology  Family Communication: Updated daughter present at bedside  Severity of Illness: The appropriate patient status for this patient is OBSERVATION. Observation status is judged to be reasonable and necessary in order to provide the required intensity of service to ensure the patient's safety. The patient's presenting symptoms, physical exam findings, and initial radiographic and laboratory data in the context of their medical condition is felt to place them at decreased risk for further clinical deterioration. Furthermore, it is anticipated that the patient will be medically stable for discharge from the hospital within 2 midnights of admission.   Author: Camellia PARAS Lazara Grieser, DO 02/10/2023 12:38 PM  For on call review [email or web address].

## 2023-02-10 NOTE — Progress Notes (Signed)
 Date and time results received: 02/10/23  (use smartphrase .now to insert current time)  Test: cbc Critical Value: hgb 6.1  Name of Provider Notified: [place]  Orders Received? Or Actions Taken?: Notified Dr. [place] , patient is going to ICU bed 1

## 2023-02-10 NOTE — H&P (View-Only) (Signed)
 Gastroenterology Consult   Referring Provider: No ref. provider found Primary Care Physician:  Marvine Rush, MD Primary Gastroenterologist:  remotely seen by Dr. Golda  Patient ID: Sarah Walker; [id number]; March 15, 1950   Admit date: 02/10/2023  LOS: 0 days   Date of Consultation: 02/10/2023  Reason for Consultation:  GI bleeding    History of Present Illness   Sarah Walker is a 73 y.o. female with history ovarian cancer in 2013 s/p complete hysterectomy and chemotherapy, HTN, psoriasis who presented to the ED via EMS for black stools.   In the ED: Potassium 5.3, BUN 58, creatinine 1.17, hemoglobin 7.6 down from 12 (11/2022).  Platelets 314,000. AS Ouptitient labs yesterday: Her hemoglobin was 9.6, B12 300, folate 2.7 low, potassium 4.7.  GI Consult: Patient states that she noticed black stool initially yesterday but she thought it was because she had taken hydrocodone  and a stool softener for upper abdominal pain.  She had another episode of black stool with some blood mixed in it this morning around 9 AM.  Stool was loose.  She has a history of postprandial urgency at times since she got her gallbladder removed years ago. Before going out to eat she typically takes Pepto.  Her last use of Pepto was 2 months ago.  Typically does not have a lot of constipation issues.  Back in December she went to see her PCP regarding some neuropathy.  She was started on Lyrica  at that time but noticed that she developed upper abdominal pain shortly after.  She describes having pain in the epigastric region, radiates into the back. Not sure of any alleviating or modifying factors. She was concerned she may have internal shingles and was treated for that empirically. She denies any NSAID or aspirin  powder use.  She takes a baby aspirin  every day.Meloxicam is on her medication list but it is not clear if she is taking currently although she has used in the past. No prior EGD.   Colonoscopy 09/2014: -5mm  polyp removed from descending colon, hyperplastic -small hemorrhoids below the dentate line -colonoscopy in 10 years  Prior to Admission medications   Medication Sig Start Date End Date Taking? Authorizing Provider  acitretin (SORIATANE) 25 MG capsule Take 25 mg by mouth daily. 06/17/22  Yes [provider]  ALPRAZolam  (XANAX ) 0.5 MG tablet Take 0.5 mg by mouth at bedtime. 01/13/23  Yes [provider]  aspirin  EC 81 MG tablet Take 81 mg by mouth daily. Swallow whole.   Yes [provider]  azelastine (OPTIVAR) 0.05 % ophthalmic solution Place 1 drop into both eyes daily as needed (rag weed). 07/02/14  Yes [provider]  bismuth  subsalicylate (PEPTO BISMOL) 262 MG/15ML suspension Take 30 mLs by mouth every 6 (six) hours as needed for indigestion.   Yes [provider]  fexofenadine (ALLEGRA) 180 MG tablet Take 180 mg by mouth daily as needed for allergies or rhinitis.   Yes [provider]  fluticasone  (FLONASE ) 50 MCG/ACT nasal spray Place 1 spray into both nostrils daily as needed for allergies or rhinitis.   Yes [provider]  HYDROcodone -acetaminophen  (NORCO/VICODIN) 5-325 MG tablet Take 1-2 tablets by mouth every 6 (six) hours as needed for moderate pain (pain score 4-6).   Yes [provider]  KLOR-CON  M20 20 MEQ tablet TAKE 1 TABLET BY MOUTH EVERY DAY Patient taking differently: Take 10 mEq by mouth daily. 11/30/16  Yes Letha Truman ORN, NP  meloxicam (MOBIC) 15 MG tablet  Take 15 mg by mouth daily as needed for pain. PLEASE SEE ATTACHED FOR DETAILED DIRECTIONS 09/13/21  Yes [provider]  mupirocin  ointment (BACTROBAN ) 2 % Apply 1 Application topically daily as needed. 01/13/23  Yes [provider]  ondansetron  (ZOFRAN ) 4 MG tablet Take 1 tablet (4 mg total) by mouth every 8 (eight) hours as needed for nausea or vomiting. 07/22/22  Yes Brown, Blaine K, PA-C  sennosides-docusate sodium  (SENOKOT-S)  8.6-50 MG tablet Take 2 tablets by mouth daily. 07/22/22  Yes Brown, Blaine K, PA-C  valsartan -hydrochlorothiazide  (DIOVAN -HCT) 320-12.5 MG tablet Take 1 tablet by mouth daily. 05/21/22  Yes [provider]  acetaminophen  (TYLENOL ) 500 MG tablet Take 1,000 mg by mouth every 8 (eight) hours as needed for moderate pain.    [provider]    No current facility-administered medications for this encounter.   Current Outpatient Medications  Medication Sig Dispense Refill   acitretin (SORIATANE) 25 MG capsule Take 25 mg by mouth daily.     ALPRAZolam  (XANAX ) 0.5 MG tablet Take 0.5 mg by mouth at bedtime.     aspirin  EC 81 MG tablet Take 81 mg by mouth daily. Swallow whole.     azelastine (OPTIVAR) 0.05 % ophthalmic solution Place 1 drop into both eyes daily as needed (rag weed).  11   bismuth  subsalicylate (PEPTO BISMOL) 262 MG/15ML suspension Take 30 mLs by mouth every 6 (six) hours as needed for indigestion.     fexofenadine (ALLEGRA) 180 MG tablet Take 180 mg by mouth daily as needed for allergies or rhinitis.     fluticasone  (FLONASE ) 50 MCG/ACT nasal spray Place 1 spray into both nostrils daily as needed for allergies or rhinitis.     HYDROcodone -acetaminophen  (NORCO/VICODIN) 5-325 MG tablet Take 1-2 tablets by mouth every 6 (six) hours as needed for moderate pain (pain score 4-6).     KLOR-CON  M20 20 MEQ tablet TAKE 1 TABLET BY MOUTH EVERY DAY (Patient taking differently: Take 10 mEq by mouth daily.) 90 tablet 1   meloxicam (MOBIC) 15 MG tablet Take 15 mg by mouth daily as needed for pain. PLEASE SEE ATTACHED FOR DETAILED DIRECTIONS     mupirocin  ointment (BACTROBAN ) 2 % Apply 1 Application topically daily as needed.     ondansetron  (ZOFRAN ) 4 MG tablet Take 1 tablet (4 mg total) by mouth every 8 (eight) hours as needed for nausea or vomiting. 10 tablet 0   sennosides-docusate sodium  (SENOKOT-S) 8.6-50 MG tablet Take 2 tablets by mouth daily. 30 tablet 1    valsartan -hydrochlorothiazide  (DIOVAN -HCT) 320-12.5 MG tablet Take 1 tablet by mouth daily.     acetaminophen  (TYLENOL ) 500 MG tablet Take 1,000 mg by mouth every 8 (eight) hours as needed for moderate pain.     Facility-Administered Medications Ordered in Other Encounters  Medication Dose Route Frequency Provider Last Rate Last Admin   sodium chloride  0.9 % injection 10 mL  10 mL Intravenous PRN Kefalas, Thomas S, PA-C   10 mL at 08/31/12 1446   sodium chloride  0.9 % injection 10 mL  10 mL Intravenous PRN Lawernce Hacker, MD   10 mL at 09/12/13 1408    Allergies as of 02/10/2023 - Review Complete 02/10/2023  Allergen Reaction Noted   Codeine Nausea And Vomiting 06/17/2011   Erythromycin Itching and Other (See Comments) 12/11/2021    Past Medical History:  Diagnosis Date   Abdominal hernia    Allergic rhinitis    Arthritis    Bone spur of other site  left foot   Dry eyes    Family history of kidney cancer    Family history of prostate cancer    Hypertension    Ovarian cancer (HCC)    Ovarian cancer on right (HCC) 06/17/2011   Peripheral neuropathy 08/26/2011   Chemotherapy-induced   Pulmonary embolism (HCC)    Sinusitis     Past Surgical History:  Procedure Laterality Date   ABDOMINAL HYSTERECTOMY  05/25/2011   [place] bs&[facility] and cancer staging   CHOLECYSTECTOMY  01/05/1988   COLONOSCOPY N/A 09/06/2014   Procedure: COLONOSCOPY;  Surgeon: Claudis RAYMOND Rivet, MD;  Location: [facility];  Service: Endoscopy;  Laterality: N/A;  930 - moved to 9/2 @ 8:25   I & D EXTREMITY Left 12/13/2021   Procedure: IRRIGATION AND DEBRIDEMENT ANKLE/ LEG;  Surgeon: Beverley Evalene BIRCH, MD;  Location: [facility] OR;  Service: Orthopedics;  Laterality: Left;   INCISIONAL HERNIA REPAIR  10/15/2014   INCISIONAL HERNIA REPAIR  10/15/2014   Procedure: OPEN INCISIONAL HERNIA REPAIR WITH MESH AND MYOFASCIAL FLAPS;  Surgeon: Debby Shipper, MD;  Location: [facility] OR;  Service: General;;   IVC filter  05/24/2011    PORT-A-CATH REMOVAL Right 11/21/2013   Procedure: MINOR REMOVAL PORT-A-CATH;  Surgeon: Oneil DELENA Budge, MD;  Location: [facility] ORS;  Service: General;  Laterality: Right;   PORTACATH PLACEMENT  06/16/2011   Done at [facility]   ROTATOR CUFF REPAIR Right    TONSILLECTOMY     age 50's   TOTAL HIP ARTHROPLASTY Right 08/26/2020   Procedure: TOTAL HIP ARTHROPLASTY ANTERIOR APPROACH;  Surgeon: Beverley Evalene BIRCH, MD;  Location: [facility] ORS;  Service: Orthopedics;  Laterality: Right;   TOTAL KNEE ARTHROPLASTY Left 07/20/2022   Procedure: TOTAL KNEE ARTHROPLASTY;  Surgeon: Beverley Evalene BIRCH, MD;  Location: [facility] ORS;  Service: Orthopedics;  Laterality: Left;   TUBAL LIGATION  01/05/1979    Family History  Problem Relation Age of Onset   Stroke Mother    Cervical cancer Mother 12   Kidney cancer Father 75   Aneurysm Paternal Grandmother        31s   Hodgkin's lymphoma Maternal Uncle 33   Prostate cancer Paternal Uncle    Throat cancer Paternal Uncle    Cancer Cousin        2 female maternal first cousins with unknown cancer   Colon cancer Neg Hx    Stomach cancer Neg Hx     Social History   Socioeconomic History   Marital status: Married    Spouse name: Not on file   Number of children: Not on file   Years of education: Not on file   Highest education level: Not on file  Occupational History   Not on file  Tobacco Use   Smoking status: Never   Smokeless tobacco: Never  Vaping Use   Vaping status: Never Used  Substance and Sexual Activity   Alcohol use: No   Drug use: No   Sexual activity: Yes    Birth control/protection: Surgical    Comment: hyst  Other Topics Concern   Not on file  Social History Narrative   Not on file   Social Drivers of Health   Financial Resource Strain: Not on file  Food Insecurity: No Food Insecurity (07/20/2022)   Hunger Vital Sign    Worried About Running Out of Food in the Last Year: Never true    Ran Out of Food in the Last Year: Never true   Transportation Needs: No Transportation  Needs (07/20/2022)   PRAPARE - Administrator, Civil Service (Medical): No    Lack of Transportation (Non-Medical): No  Physical Activity: Not on file  Stress: Not on file  Social Connections: Not on file  Intimate Partner Violence: Not At Risk (07/20/2022)   Humiliation, Afraid, Rape, and Kick questionnaire    Fear of Current or Ex-Partner: No    Emotionally Abused: No    Physically Abused: No    Sexually Abused: No     Review of System:   General: Negative for anorexia, weight loss, fever, chills, fatigue, +weakness. Eyes: Negative for vision changes.  ENT: Negative for hoarseness, difficulty swallowing , nasal congestion. CV: Negative for chest pain, angina, palpitations, dyspnea on exertion, peripheral edema.  Respiratory: Negative for dyspnea at rest, dyspnea on exertion, cough, sputum, wheezing.  GI: See history of present illness. Denies heartburn, dysphagia, n/v. GU:  Negative for dysuria, hematuria, urinary incontinence, urinary frequency, nocturnal urination.  MS: Negative for joint pain, low back pain.  Derm: Negative for rash or itching.  Neuro: Negative for weakness, abnormal sensation, seizure, frequent headaches, memory loss, confusion.  Psych: Negative for anxiety, depression, suicidal ideation, hallucinations.  Endo: Negative for unusual weight change.  Heme: Negative for bruising or bleeding. Allergy: Negative for rash or hives.      Physical Examination:   Vital signs in last 24 hours: Temp:  [98 F (36.7 C)] 98 F (36.7 C) (02/06 0941) Pulse Rate:  [91-109] 91 (02/06 1045) Resp:  [16-18] 16 (02/06 1045) BP: (99-130)/(69-76) 130/69 (02/06 1045) SpO2:  [98 %-99 %] 98 % (02/06 1045) Weight:  [89.8 kg] 89.8 kg (02/06 0939)    General: Well-nourished, well-developed in no acute distress.  Head: Normocephalic, atraumatic.   Eyes: Conjunctiva pink, no icterus. Mouth: Oropharyngeal mucosa moist and pink   Neck: Supple without thyromegaly, masses, or lymphadenopathy.  Lungs: Clear to auscultation bilaterally.  Heart: Regular rate and rhythm, no murmurs rubs or gallops.  Abdomen: Bowel sounds are normal, nondistended, no hepatosplenomegaly or masses, no abdominal bruits or hernia , no rebound or guarding.  Mild epigastric tenderness Rectal: not performed Extremities: No lower extremity edema, clubbing, deformity.  Neuro: Alert and oriented x 4 , grossly normal neurologically.  Skin: Warm and dry, no rash or jaundice.   Psych: Alert and cooperative, normal mood and affect.        Intake/Output from previous day: No intake/output data recorded. Intake/Output this shift: No intake/output data recorded.  Lab Results:   CBC Recent Labs    02/10/23 1100  WBC 9.4  HGB 7.6*  HCT 24.5*  MCV 100.0  PLT 314   BMET Recent Labs    02/10/23 1100  NA 136  K 5.3*  CL 104  CO2 21*  GLUCOSE 103*  BUN 58*  CREATININE 1.17*  CALCIUM 8.5*   LFT Recent Labs    02/10/23 1100  BILITOT 0.9  ALKPHOS 39  AST 22  ALT 10  PROT 6.1*  ALBUMIN  2.9*    Lipase No results for input(s): LIPASE in the last 72 hours.  PT/INR No results for input(s): LABPROT, INR in the last 72 hours.   Hepatitis Panel No results for input(s): HEPBSAG, HCVAB, HEPAIGM, HEPBIGM in the last 72 hours.   Imaging Studies:   No results found.GALA.GANDER week]  Assessment:   Pleasant 73 year old female presenting with acute onset melena with 95-month history of epigastric pain, acute change in her hemoglobin.  GI bleed: -suspect UGI bleeding due to  PUD, but malignancy remains in the differential. She has had two month history of epigastric pain. Acute onset melena starting yesterday.It is unclear whether she is on meloxicam which is on her medication list, previously denied NSAID use.  Takes aspirin  81 mg daily.  Folate deficiency: folate level of 2.7 on outpatient labs yesterday.   Plan:   Egd  today.  I have discussed the risks, alternatives, benefits with regards to but not limited to the risk of reaction to medication, bleeding, infection, perforation and the patient is agreeable to proceed. Written consent to be obtained. IV PPI BID. Trend Hgb, transfuse if less than 7.   LOS: 0 days   We would like to thank you for the opportunity to participate in the care of QUANASIA DEFINO.  Sonny RAMAN. Ezzard RIGGERS [facility] [phone] 2/6/202512:36 PM

## 2023-02-10 NOTE — ED Provider Notes (Signed)
 [place] EMERGENCY DEPARTMENT AT [facility] Provider Note   CSN: [id number] Arrival date & time: 02/10/23  9072     History  Chief Complaint  Patient presents with   Hematochezia    Sarah Walker is a 73 y.o. female.  HPI 73 year old female presents with dark and bloody stools.  Last night noticed mildly black stool and then had darker stool with some red in it this morning.  She has been feeling generally weak/lightheaded for several days, up to a week.  She has had on and off vomiting for 1-3 weeks.  She has been dealing with on and off abdominal pain and was told she had internal shingles back in December.  No current abdominal pain.  She is on a baby aspirin  but no longer on blood thinners, was on Xarelto  in July after knee surgery but no longer.  Home Medications Prior to Admission medications   Medication Sig Start Date End Date Taking? Authorizing Provider  acetaminophen  (TYLENOL ) 500 MG tablet Take 1,000 mg by mouth every 8 (eight) hours as needed for moderate pain.    [provider]  acitretin (SORIATANE) 25 MG capsule Take 25 mg by mouth daily. 06/17/22   [provider]  azelastine (OPTIVAR) 0.05 % ophthalmic solution Place 1 drop into both eyes daily as needed (rag weed). 07/02/14   [provider]  bismuth  subsalicylate (PEPTO BISMOL) 262 MG/15ML suspension Take 30 mLs by mouth every 6 (six) hours as needed for indigestion.    [provider]  DHA-EPA-Vitamin E (OMEGA-3 COMPLEX PO) Take 3 capsules by mouth daily. Omega xl    [provider]  fexofenadine (ALLEGRA) 180 MG tablet Take 180 mg by mouth daily as needed for allergies or rhinitis.    [provider]  fluticasone  (FLONASE ) 50 MCG/ACT nasal spray Place 1 spray into both nostrils daily as needed for allergies or rhinitis.    [provider]  furosemide  (LASIX ) 40 MG tablet Take 40 mg by mouth daily as needed for edema. 12/07/21   [provider]  KLOR-CON  M20 20 MEQ tablet TAKE 1 TABLET BY MOUTH EVERY DAY Patient taking differently: Take 10 mEq by mouth daily. 11/30/16   Letha Truman ORN, NP  meloxicam (MOBIC) 15 MG tablet Take 15 mg by mouth daily as needed for pain. PLEASE SEE ATTACHED FOR DETAILED DIRECTIONS 09/13/21   [provider]  ondansetron  (ZOFRAN ) 4 MG tablet Take 1 tablet (4 mg total) by mouth every 8 (eight) hours as needed for nausea or vomiting. 07/22/22   Delores Army POUR, PA-C  oxyCODONE  (ROXICODONE ) 5 MG immediate release tablet Take 1 tablet (5 mg total) by mouth every 4 (four) hours as needed for severe pain. 07/22/22   Brown, Blaine K, PA-C  rivaroxaban  (XARELTO ) 10 MG TABS tablet Take 1 tablet (10 mg total) by mouth daily. 07/22/22   Brown, Blaine K, PA-C  sennosides-docusate sodium  (SENOKOT-S) 8.6-50 MG tablet Take 2 tablets by mouth daily. 07/22/22   Brown, Blaine K, PA-C  valsartan -hydrochlorothiazide  (DIOVAN -HCT) 320-12.5 MG tablet Take 1 tablet by mouth daily. 05/21/22   [provider]  zolpidem  (AMBIEN ) 5 MG tablet Take 1 tablet (5 mg total) by mouth at bedtime as needed for sleep. 07/22/22   Brown, Blaine K, PA-C      Allergies    Codeine and Erythromycin    Review of Systems   Review of Systems  Gastrointestinal:  Positive for abdominal pain, blood in stool and vomiting.  Neurological:  Positive for weakness and light-headedness.    Physical Exam Updated Vital Signs BP 99/76 (BP Location: Right Arm)   Pulse (!) 109   Temp 98 F (36.7 C) (Oral)   Resp 18   Ht 5' (1.524 m)   Wt 89.8 kg   SpO2 99%   BMI 38.66 kg/m  Physical Exam Vitals and nursing note reviewed. Exam conducted with a chaperone present.  Constitutional:      General: She is not in acute distress.    Appearance: She is well-developed. She is not ill-appearing or diaphoretic.  HENT:     Head: Normocephalic and atraumatic.  Cardiovascular:     Rate and Rhythm: Normal rate and regular rhythm.      Heart sounds: Normal heart sounds.  Pulmonary:     Effort: Pulmonary effort is normal.     Breath sounds: Normal breath sounds.  Abdominal:     General: There is no distension.     Palpations: Abdomen is soft.     Tenderness: There is no abdominal tenderness.  Genitourinary:    Rectum: Guaiac result positive. No external hemorrhoid or internal hemorrhoid.  Skin:    General: Skin is warm and dry.  Neurological:     Mental Status: She is alert.     ED Results / Procedures / Treatments   Labs (all labs ordered are listed, but only abnormal results are displayed) Labs Reviewed  COMPREHENSIVE METABOLIC PANEL  CBC WITH DIFFERENTIAL/PLATELET  POC OCCULT BLOOD, ED  TYPE AND SCREEN    EKG None  Radiology No results found.  Procedures Procedures    Medications Ordered in ED Medications - No data to display  ED Course/ Medical Decision Making/ A&P                                 Medical Decision Making Amount and/or Complexity of Data Reviewed Labs: ordered.    Details: BUN 58, Hgb 7.6  Risk Prescription drug management. Decision regarding hospitalization.   Patient is hemodynamically stable, overall well appearing. Has evidence of GI bleeding with report of melena, heme-positive stool, and decreased hemoglobin.  He is otherwise stable and so we will trend hemoglobins but hold off on transfusion at this time.  Patient otherwise has been discussed with gastroenterology, Dr. Cinderella, who will take to EGD.  Discussed with Dr. Austria for admission.        Final Clinical Impression(s) / ED Diagnoses Final diagnoses:  Acute GI bleeding    Rx / DC Orders ED Discharge Orders     None         Freddi Hamilton, MD 02/10/23 1444

## 2023-02-10 NOTE — Transfer of Care (Signed)
 Immediate Anesthesia Transfer of Care Note  Patient: Sarah Walker  Procedure(s) Performed: ESOPHAGOGASTRODUODENOSCOPY (EGD) WITH PROPOFOL  SCLEROTHERAPY  Patient Location: PACU  Anesthesia Type:General  Level of Consciousness: drowsy, patient cooperative, and responds to stimulation  Airway & Oxygen Therapy: Patient Spontanous Breathing  Post-op Assessment: Report given to RN, Post -op Vital signs reviewed and stable, Patient moving all extremities X 4, and Patient able to stick tongue midline  Post vital signs: Reviewed and stable  Last Vitals:  Vitals Value Taken Time  BP 168/95 02/10/23 1326  Temp 36.7 C 02/10/23 1326  Pulse 62 02/10/23 1328  Resp 16 02/10/23 1328  SpO2 97 % 02/10/23 1328  Vitals shown include unfiled device data.  Last Pain:  Vitals:   02/10/23 1307  TempSrc:   PainSc: 0-No pain         Complications: No notable events documented.

## 2023-02-10 NOTE — Plan of Care (Signed)
   Problem: Education: Goal: Knowledge of General Education information will improve Description Including pain rating scale, medication(s)/side effects and non-pharmacologic comfort measures Outcome: Progressing   Problem: Health Behavior/Discharge Planning: Goal: Ability to manage health-related needs will improve Outcome: Progressing

## 2023-02-10 NOTE — Consult Note (Signed)
 Gastroenterology Consult   Referring Provider: No ref. provider found Primary Care Physician:  Marvine Rush, MD Primary Gastroenterologist:  remotely seen by Dr. Golda  Patient ID: Sarah Walker; [id number]; March 15, 1950   Admit date: 02/10/2023  LOS: 0 days   Date of Consultation: 02/10/2023  Reason for Consultation:  GI bleeding    History of Present Illness   [name] is a 73 y.o. female with history ovarian cancer in 2013 s/p complete hysterectomy and chemotherapy, HTN, psoriasis who presented to the ED via EMS for black stools.   In the ED: Potassium 5.3, BUN 58, creatinine 1.17, hemoglobin 7.6 down from 12 (11/2022).  Platelets 314,000. AS Ouptitient labs yesterday: Her hemoglobin was 9.6, B12 300, folate 2.7 low, potassium 4.7.  GI Consult: Patient states that she noticed black stool initially yesterday but she thought it was because she had taken hydrocodone  and a stool softener for upper abdominal pain.  She had another episode of black stool with some blood mixed in it this morning around 9 AM.  Stool was loose.  She has a history of postprandial urgency at times since she got her gallbladder removed years ago. Before going out to eat she typically takes Pepto.  Her last use of Pepto was 2 months ago.  Typically does not have a lot of constipation issues.  Back in December she went to see her PCP regarding some neuropathy.  She was started on Lyrica  at that time but noticed that she developed upper abdominal pain shortly after.  She describes having pain in the epigastric region, radiates into the back. Not sure of any alleviating or modifying factors. She was concerned she may have internal shingles and was treated for that empirically. She denies any NSAID or aspirin  powder use.  She takes a baby aspirin  every day.Meloxicam is on her medication list but it is not clear if she is taking currently although she has used in the past. No prior EGD.   Colonoscopy 09/2014: -5mm  polyp removed from descending colon, hyperplastic -small hemorrhoids below the dentate line -colonoscopy in 10 years  Prior to Admission medications   Medication Sig Start Date End Date Taking? Authorizing Provider  acitretin (SORIATANE) 25 MG capsule Take 25 mg by mouth daily. 06/17/22  Yes [provider]  ALPRAZolam  (XANAX ) 0.5 MG tablet Take 0.5 mg by mouth at bedtime. 01/13/23  Yes [provider]  aspirin  EC 81 MG tablet Take 81 mg by mouth daily. Swallow whole.   Yes [provider]  azelastine (OPTIVAR) 0.05 % ophthalmic solution Place 1 drop into both eyes daily as needed (rag weed). 07/02/14  Yes [provider]  bismuth  subsalicylate (PEPTO BISMOL) 262 MG/15ML suspension Take 30 mLs by mouth every 6 (six) hours as needed for indigestion.   Yes [provider]  fexofenadine (ALLEGRA) 180 MG tablet Take 180 mg by mouth daily as needed for allergies or rhinitis.   Yes [provider]  fluticasone  (FLONASE ) 50 MCG/ACT nasal spray Place 1 spray into both nostrils daily as needed for allergies or rhinitis.   Yes [provider]  HYDROcodone -acetaminophen  (NORCO/VICODIN) 5-325 MG tablet Take 1-2 tablets by mouth every 6 (six) hours as needed for moderate pain (pain score 4-6).   Yes [provider]  KLOR-CON  M20 20 MEQ tablet TAKE 1 TABLET BY MOUTH EVERY DAY Patient taking differently: Take 10 mEq by mouth daily. 11/30/16  Yes Letha Truman ORN, NP  meloxicam (MOBIC) 15 MG tablet  Take 15 mg by mouth daily as needed for pain. PLEASE SEE ATTACHED FOR DETAILED DIRECTIONS 09/13/21  Yes [provider]  mupirocin  ointment (BACTROBAN ) 2 % Apply 1 Application topically daily as needed. 01/13/23  Yes [provider]  ondansetron  (ZOFRAN ) 4 MG tablet Take 1 tablet (4 mg total) by mouth every 8 (eight) hours as needed for nausea or vomiting. 07/22/22  Yes Brown, Blaine K, PA-C  sennosides-docusate sodium  (SENOKOT-S)  8.6-50 MG tablet Take 2 tablets by mouth daily. 07/22/22  Yes Brown, Blaine K, PA-C  valsartan -hydrochlorothiazide  (DIOVAN -HCT) 320-12.5 MG tablet Take 1 tablet by mouth daily. 05/21/22  Yes [provider]  acetaminophen  (TYLENOL ) 500 MG tablet Take 1,000 mg by mouth every 8 (eight) hours as needed for moderate pain.    [provider]    No current facility-administered medications for this encounter.   Current Outpatient Medications  Medication Sig Dispense Refill   acitretin (SORIATANE) 25 MG capsule Take 25 mg by mouth daily.     ALPRAZolam  (XANAX ) 0.5 MG tablet Take 0.5 mg by mouth at bedtime.     aspirin  EC 81 MG tablet Take 81 mg by mouth daily. Swallow whole.     azelastine (OPTIVAR) 0.05 % ophthalmic solution Place 1 drop into both eyes daily as needed (rag weed).  11   bismuth  subsalicylate (PEPTO BISMOL) 262 MG/15ML suspension Take 30 mLs by mouth every 6 (six) hours as needed for indigestion.     fexofenadine (ALLEGRA) 180 MG tablet Take 180 mg by mouth daily as needed for allergies or rhinitis.     fluticasone  (FLONASE ) 50 MCG/ACT nasal spray Place 1 spray into both nostrils daily as needed for allergies or rhinitis.     HYDROcodone -acetaminophen  (NORCO/VICODIN) 5-325 MG tablet Take 1-2 tablets by mouth every 6 (six) hours as needed for moderate pain (pain score 4-6).     KLOR-CON  M20 20 MEQ tablet TAKE 1 TABLET BY MOUTH EVERY DAY (Patient taking differently: Take 10 mEq by mouth daily.) 90 tablet 1   meloxicam (MOBIC) 15 MG tablet Take 15 mg by mouth daily as needed for pain. PLEASE SEE ATTACHED FOR DETAILED DIRECTIONS     mupirocin  ointment (BACTROBAN ) 2 % Apply 1 Application topically daily as needed.     ondansetron  (ZOFRAN ) 4 MG tablet Take 1 tablet (4 mg total) by mouth every 8 (eight) hours as needed for nausea or vomiting. 10 tablet 0   sennosides-docusate sodium  (SENOKOT-S) 8.6-50 MG tablet Take 2 tablets by mouth daily. 30 tablet 1    valsartan -hydrochlorothiazide  (DIOVAN -HCT) 320-12.5 MG tablet Take 1 tablet by mouth daily.     acetaminophen  (TYLENOL ) 500 MG tablet Take 1,000 mg by mouth every 8 (eight) hours as needed for moderate pain.     Facility-Administered Medications Ordered in Other Encounters  Medication Dose Route Frequency Provider Last Rate Last Admin   sodium chloride  0.9 % injection 10 mL  10 mL Intravenous PRN Kefalas, Thomas S, PA-C   10 mL at 08/31/12 1446   sodium chloride  0.9 % injection 10 mL  10 mL Intravenous PRN Lawernce Hacker, MD   10 mL at 09/12/13 1408    Allergies as of 02/10/2023 - Review Complete 02/10/2023  Allergen Reaction Noted   Codeine Nausea And Vomiting 06/17/2011   Erythromycin Itching and Other (See Comments) 12/11/2021    Past Medical History:  Diagnosis Date   Abdominal hernia    Allergic rhinitis    Arthritis    Bone spur of other site  left foot   Dry eyes    Family history of kidney cancer    Family history of prostate cancer    Hypertension    Ovarian cancer (HCC)    Ovarian cancer on right (HCC) 06/17/2011   Peripheral neuropathy 08/26/2011   Chemotherapy-induced   Pulmonary embolism (HCC)    Sinusitis     Past Surgical History:  Procedure Laterality Date   ABDOMINAL HYSTERECTOMY  05/25/2011   [place] bs&o and cancer staging   CHOLECYSTECTOMY  01/05/1988   COLONOSCOPY N/A 09/06/2014   Procedure: COLONOSCOPY;  Surgeon: Claudis RAYMOND Rivet, MD;  Location: [facility];  Service: Endoscopy;  Laterality: N/A;  930 - moved to 9/2 @ 8:25   I & D EXTREMITY Left 12/13/2021   Procedure: IRRIGATION AND DEBRIDEMENT ANKLE/ LEG;  Surgeon: Beverley Evalene BIRCH, MD;  Location: [facility] OR;  Service: Orthopedics;  Laterality: Left;   INCISIONAL HERNIA REPAIR  10/15/2014   INCISIONAL HERNIA REPAIR  10/15/2014   Procedure: OPEN INCISIONAL HERNIA REPAIR WITH MESH AND MYOFASCIAL FLAPS;  Surgeon: Debby Shipper, MD;  Location: [facility] OR;  Service: General;;   IVC filter  05/24/2011    PORT-A-CATH REMOVAL Right 11/21/2013   Procedure: MINOR REMOVAL PORT-A-CATH;  Surgeon: Oneil DELENA Budge, MD;  Location: [facility] ORS;  Service: General;  Laterality: Right;   PORTACATH PLACEMENT  06/16/2011   Done at [facility]   ROTATOR CUFF REPAIR Right    TONSILLECTOMY     age 50's   TOTAL HIP ARTHROPLASTY Right 08/26/2020   Procedure: TOTAL HIP ARTHROPLASTY ANTERIOR APPROACH;  Surgeon: Beverley Evalene BIRCH, MD;  Location: [facility] ORS;  Service: Orthopedics;  Laterality: Right;   TOTAL KNEE ARTHROPLASTY Left 07/20/2022   Procedure: TOTAL KNEE ARTHROPLASTY;  Surgeon: Beverley Evalene BIRCH, MD;  Location: [facility] ORS;  Service: Orthopedics;  Laterality: Left;   TUBAL LIGATION  01/05/1979    Family History  Problem Relation Age of Onset   Stroke Mother    Cervical cancer Mother 12   Kidney cancer Father 75   Aneurysm Paternal Grandmother        31s   Hodgkin's lymphoma Maternal Uncle 33   Prostate cancer Paternal Uncle    Throat cancer Paternal Uncle    Cancer Cousin        2 female maternal first cousins with unknown cancer   Colon cancer Neg Hx    Stomach cancer Neg Hx     Social History   Socioeconomic History   Marital status: Married    Spouse name: Not on file   Number of children: Not on file   Years of education: Not on file   Highest education level: Not on file  Occupational History   Not on file  Tobacco Use   Smoking status: Never   Smokeless tobacco: Never  Vaping Use   Vaping status: Never Used  Substance and Sexual Activity   Alcohol use: No   Drug use: No   Sexual activity: Yes    Birth control/protection: Surgical    Comment: hyst  Other Topics Concern   Not on file  Social History Narrative   Not on file   Social Drivers of Health   Financial Resource Strain: Not on file  Food Insecurity: No Food Insecurity (07/20/2022)   Hunger Vital Sign    Worried About Running Out of Food in the Last Year: Never true    Ran Out of Food in the Last Year: Never true   Transportation Needs: No Transportation  Needs (07/20/2022)   PRAPARE - Administrator, Civil Service (Medical): No    Lack of Transportation (Non-Medical): No  Physical Activity: Not on file  Stress: Not on file  Social Connections: Not on file  Intimate Partner Violence: Not At Risk (07/20/2022)   Humiliation, Afraid, Rape, and Kick questionnaire    Fear of Current or Ex-Partner: No    Emotionally Abused: No    Physically Abused: No    Sexually Abused: No     Review of System:   General: Negative for anorexia, weight loss, fever, chills, fatigue, +weakness. Eyes: Negative for vision changes.  ENT: Negative for hoarseness, difficulty swallowing , nasal congestion. CV: Negative for chest pain, angina, palpitations, dyspnea on exertion, peripheral edema.  Respiratory: Negative for dyspnea at rest, dyspnea on exertion, cough, sputum, wheezing.  GI: See history of present illness. Denies heartburn, dysphagia, n/v. GU:  Negative for dysuria, hematuria, urinary incontinence, urinary frequency, nocturnal urination.  MS: Negative for joint pain, low back pain.  Derm: Negative for rash or itching.  Neuro: Negative for weakness, abnormal sensation, seizure, frequent headaches, memory loss, confusion.  Psych: Negative for anxiety, depression, suicidal ideation, hallucinations.  Endo: Negative for unusual weight change.  Heme: Negative for bruising or bleeding. Allergy: Negative for rash or hives.      Physical Examination:   Vital signs in last 24 hours: Temp:  [98 F (36.7 C)] 98 F (36.7 C) (02/06 0941) Pulse Rate:  [91-109] 91 (02/06 1045) Resp:  [16-18] 16 (02/06 1045) BP: (99-130)/(69-76) 130/69 (02/06 1045) SpO2:  [98 %-99 %] 98 % (02/06 1045) Weight:  [89.8 kg] 89.8 kg (02/06 0939)    General: Well-nourished, well-developed in no acute distress.  Head: Normocephalic, atraumatic.   Eyes: Conjunctiva pink, no icterus. Mouth: Oropharyngeal mucosa moist and pink   Neck: Supple without thyromegaly, masses, or lymphadenopathy.  Lungs: Clear to auscultation bilaterally.  Heart: Regular rate and rhythm, no murmurs rubs or gallops.  Abdomen: Bowel sounds are normal, nondistended, no hepatosplenomegaly or masses, no abdominal bruits or hernia , no rebound or guarding.  Mild epigastric tenderness Rectal: not performed Extremities: No lower extremity edema, clubbing, deformity.  Neuro: Alert and oriented x 4 , grossly normal neurologically.  Skin: Warm and dry, no rash or jaundice.   Psych: Alert and cooperative, normal mood and affect.        Intake/Output from previous day: No intake/output data recorded. Intake/Output this shift: No intake/output data recorded.  Lab Results:   CBC Recent Labs    02/10/23 1100  WBC 9.4  HGB 7.6*  HCT 24.5*  MCV 100.0  PLT 314   BMET Recent Labs    02/10/23 1100  NA 136  K 5.3*  CL 104  CO2 21*  GLUCOSE 103*  BUN 58*  CREATININE 1.17*  CALCIUM 8.5*   LFT Recent Labs    02/10/23 1100  BILITOT 0.9  ALKPHOS 39  AST 22  ALT 10  PROT 6.1*  ALBUMIN  2.9*    Lipase No results for input(s): LIPASE in the last 72 hours.  PT/INR No results for input(s): LABPROT, INR in the last 72 hours.   Hepatitis Panel No results for input(s): HEPBSAG, HCVAB, HEPAIGM, HEPBIGM in the last 72 hours.   Imaging Studies:   No results found.GALA.GANDER week]  Assessment:   Pleasant 73 year old female presenting with acute onset melena with 95-month history of epigastric pain, acute change in her hemoglobin.  GI bleed: -suspect UGI bleeding due to  PUD, but malignancy remains in the differential. She has had two month history of epigastric pain. Acute onset melena starting yesterday.It is unclear whether she is on meloxicam which is on her medication list, previously denied NSAID use.  Takes aspirin  81 mg daily.  Folate deficiency: folate level of 2.7 on outpatient labs yesterday.   Plan:   Egd  today.  I have discussed the risks, alternatives, benefits with regards to but not limited to the risk of reaction to medication, bleeding, infection, perforation and the patient is agreeable to proceed. Written consent to be obtained. IV PPI BID. Trend Hgb, transfuse if less than 7.   LOS: 0 days   We would like to thank you for the opportunity to participate in the care of QUANASIA DEFINO.  Sonny RAMAN. Ezzard RIGGERS [facility] [phone] 2/6/202512:36 PM

## 2023-02-11 ENCOUNTER — Encounter (HOSPITAL_COMMUNITY): Payer: Self-pay | Admitting: Gastroenterology

## 2023-02-11 ENCOUNTER — Inpatient Hospital Stay (HOSPITAL_COMMUNITY): Payer: Medicare Other

## 2023-02-11 DIAGNOSIS — K922 Gastrointestinal hemorrhage, unspecified: Secondary | ICD-10-CM | POA: Diagnosis not present

## 2023-02-11 HISTORY — PX: IR EMBO ART  VEN HEMORR LYMPH EXTRAV  INC GUIDE ROADMAPPING: IMG5450

## 2023-02-11 HISTORY — PX: IR ANGIOGRAM VISCERAL SELECTIVE: IMG657

## 2023-02-11 HISTORY — PX: IR ANGIOGRAM SELECTIVE EACH ADDITIONAL VESSEL: IMG667

## 2023-02-11 HISTORY — PX: IR US GUIDE VASC ACCESS RIGHT: IMG2390

## 2023-02-11 LAB — CBC
HCT: 26.2 % — ABNORMAL LOW (ref 36.0–46.0)
HCT: 15.7 % — ABNORMAL LOW (ref 36.0–46.0)
Hemoglobin: 8.7 g/dL — ABNORMAL LOW (ref 12.0–15.0)
Hemoglobin: 5 g/dL — CL (ref 12.0–15.0)
MCH: 29.5 pg (ref 26.0–34.0)
MCH: 29.1 pg (ref 26.0–34.0)
MCHC: 33.2 g/dL (ref 30.0–36.0)
MCHC: 31.8 g/dL (ref 30.0–36.0)
MCV: 88.8 fL (ref 80.0–100.0)
MCV: 91.3 fL (ref 80.0–100.0)
Platelets: 284 10*3/uL (ref 150–400)
Platelets: 264 10*3/uL (ref 150–400)
RBC: 2.95 MIL/uL — ABNORMAL LOW (ref 3.87–5.11)
RBC: 1.72 MIL/uL — ABNORMAL LOW (ref 3.87–5.11)
RDW: 19.6 % — ABNORMAL HIGH (ref 11.5–15.5)
RDW: 20.1 % — ABNORMAL HIGH (ref 11.5–15.5)
WBC: 8.1 10*3/uL (ref 4.0–10.5)
WBC: 8.8 10*3/uL (ref 4.0–10.5)
nRBC: 0 % (ref 0.0–0.2)
nRBC: 0 % (ref 0.0–0.2)

## 2023-02-11 LAB — BASIC METABOLIC PANEL
Anion gap: 7 (ref 5–15)
BUN: 48 mg/dL — ABNORMAL HIGH (ref 8–23)
CO2: 23 mmol/L (ref 22–32)
Calcium: 8.2 mg/dL — ABNORMAL LOW (ref 8.9–10.3)
Chloride: 106 mmol/L (ref 98–111)
Creatinine, Ser: 0.87 mg/dL (ref 0.44–1.00)
GFR, Estimated: >60 mL/min (ref >60)
Glucose, Bld: 90 mg/dL (ref 70–99)
Potassium: 4.1 mmol/L (ref 3.5–5.1)
Sodium: 136 mmol/L (ref 135–145)

## 2023-02-11 LAB — PREPARE RBC (CROSSMATCH)

## 2023-02-11 LAB — HEMOGLOBIN AND HEMATOCRIT, BLOOD
HCT: 28.1 % — ABNORMAL LOW (ref 36.0–46.0)
HCT: 20 % — ABNORMAL LOW (ref 36.0–46.0)
Hemoglobin: 9.1 g/dL — ABNORMAL LOW (ref 12.0–15.0)
Hemoglobin: 6.3 g/dL — CL (ref 12.0–15.0)

## 2023-02-11 LAB — PROTIME-INR
INR: 1.1 (ref 0.8–1.2)
INR: 1.5 — ABNORMAL HIGH (ref 0.8–1.2)
Prothrombin Time: 14.2 s (ref 11.4–15.2)
Prothrombin Time: 17.9 s — ABNORMAL HIGH (ref 11.4–15.2)

## 2023-02-11 LAB — LACTIC ACID, PLASMA: Lactic Acid, Venous: 2 mmol/L (ref 0.5–1.9)

## 2023-02-11 MED ORDER — VITAMIN B-12 100 MCG PO TABS
100.0000 ug | ORAL_TABLET | Freq: Every day | ORAL | Status: DC
  Administered 2023-02-11 – 2023-02-14 (×4): 100 ug via ORAL
  Filled 2023-02-11 (×4): qty 1

## 2023-02-11 MED ORDER — ALBUMIN HUMAN 25 % IV SOLN
INTRAVENOUS | Status: AC
  Filled 2023-02-11: qty 100

## 2023-02-11 MED ORDER — ALBUMIN HUMAN 25 % IV SOLN
INTRAVENOUS | Status: AC | PRN
  Administered 2023-02-11 (×2): 25 g via INTRAVENOUS

## 2023-02-11 MED ORDER — SODIUM CHLORIDE 0.9% IV SOLUTION: Freq: Once | INTRAVENOUS | Status: DC

## 2023-02-11 MED ORDER — IOHEXOL 300 MG/ML  SOLN
100.0000 mL | Freq: Once | INTRAMUSCULAR | Status: AC | PRN
  Administered 2023-02-11: 40 mL via INTRA_ARTERIAL

## 2023-02-11 MED ORDER — LIDOCAINE-EPINEPHRINE 1 %-1:100000 IJ SOLN
INTRAMUSCULAR | Status: AC
  Filled 2023-02-11: qty 1

## 2023-02-11 MED ORDER — ALBUMIN HUMAN 25 % IV SOLN
INTRAVENOUS | Status: AC
Start: 2023-02-11 — End: ?
  Filled 2023-02-11: qty 100

## 2023-02-11 MED ORDER — ONDANSETRON HCL 4 MG/2ML IJ SOLN
INTRAMUSCULAR | Status: AC | PRN
  Administered 2023-02-11: 4 mg via INTRAVENOUS

## 2023-02-11 MED ORDER — SODIUM CHLORIDE 0.9% IV SOLUTION: Freq: Once | INTRAVENOUS | Status: AC

## 2023-02-11 MED ORDER — ZOLPIDEM TARTRATE 5 MG PO TABS
5.0000 mg | ORAL_TABLET | Freq: Every evening | ORAL | Status: DC | PRN
  Administered 2023-02-11 – 2023-02-13 (×4): 5 mg via ORAL
  Filled 2023-02-11 (×3): qty 1

## 2023-02-11 MED ORDER — ONDANSETRON HCL 4 MG/2ML IJ SOLN
4.0000 mg | Freq: Once | INTRAMUSCULAR | Status: AC
  Administered 2023-02-11: 4 mg via INTRAVENOUS
  Filled 2023-02-11: qty 2

## 2023-02-11 MED ORDER — SODIUM CHLORIDE 0.9 % IV BOLUS
1000.0000 mL | Freq: Once | INTRAVENOUS | Status: AC
  Administered 2023-02-11: 1000 mL via INTRAVENOUS

## 2023-02-11 MED ORDER — MIDAZOLAM HCL 2 MG/2ML IJ SOLN
INTRAMUSCULAR | Status: AC
  Filled 2023-02-11: qty 2

## 2023-02-11 MED ORDER — ONDANSETRON HCL 4 MG/2ML IJ SOLN
INTRAMUSCULAR | Status: AC
  Filled 2023-02-11: qty 2

## 2023-02-11 MED ORDER — FENTANYL CITRATE (PF) 100 MCG/2ML IJ SOLN
INTRAMUSCULAR | Status: AC
  Filled 2023-02-11: qty 2

## 2023-02-11 MED ORDER — LIDOCAINE-EPINEPHRINE 1 %-1:100000 IJ SOLN
20.0000 mL | Freq: Once | INTRAMUSCULAR | Status: AC
  Administered 2023-02-11: 10 mL via INTRADERMAL

## 2023-02-11 MED ORDER — MEDIHONEY WOUND/BURN DRESSING EX PSTE
1.0000 | PASTE | Freq: Every day | CUTANEOUS | Status: DC
  Administered 2023-02-11 – 2023-02-13 (×3): 1 via TOPICAL
  Filled 2023-02-11 (×2): qty 44

## 2023-02-11 NOTE — Consult Note (Signed)
 WOC Nurse Consult Note: this consult performed remotely after review of EMR including photo documentation  Reason for Consult: R buttocks wound  Wound type: Stage 3 Pressure Injury R buttock  Pressure Injury POA: Yes Measurement: see nursing flowsheet Wound bed: 3 separate distinct areas that are separated by bridge of intact skin, all approximately 50% pink moist 50% yellow  Drainage (amount, consistency, odor) see nursing flowsheet  Periwound: intact  Dressing procedure/placement/frequency: Clean R buttocks wounds with NS, apply Medihoney to wound bed daily. Cover with dry gauze and silicone foam. May lift foam daily to replace Medihoney.  Change foam 3 days and prn soiling.   POC discussed with bedside nurse. WOC team will not follow. Re-consult if further needs arise.   Thank you,    Powell Bar MSN, RN-BC, [employer] [phone]

## 2023-02-11 NOTE — Progress Notes (Signed)
 Lactic 2.0 called into triad

## 2023-02-11 NOTE — Significant Event (Signed)
 Rapid Response Event Note   Reason for Call :  Hypotensive SBP 70s  Initial Focused Assessment:  Patient lying on IR table while being prepped for GIB embolization. Patient eyes closed, though alert x4, skin and gum extremely pallor. Patient without complaints at this moment. Per report from IR staff, patient transferred from [facility] for embolization. Upon arrival large bloody bowel movement with clots. Hgb noted 1200 today 9.1.   74/58 (65) HR 108 RR 24 O2 100% 2L Helen  Interventions/Plan of Care:  1L NS bolus just started upon arrival 2nd PIV site started by staff Consider albumin /blood if needed If BP drops, may require pressors Call back with further needs  90/55 (66) HR 96 RR 23 O2 100% [place]  Event Summary:  MD Notified: DOROTHA Hall MD  Call Time: 1344 Arrival Time: 1346 End Time: 8594  Tonna Chiquita POUR, RN

## 2023-02-11 NOTE — Progress Notes (Signed)
1081ml bolus started

## 2023-02-11 NOTE — Sedation Documentation (Addendum)
 Rapid response nurse at bedside; Additional bolus started per MD.

## 2023-02-11 NOTE — Progress Notes (Signed)
 HGB 6.3 critical lab

## 2023-02-11 NOTE — Procedures (Signed)
 Vascular and Interventional Radiology Procedure Note  Patient: Sarah Walker DOB: [birth year] Medical Record Number: [id number] Note Date/Time: 02/11/23 1:22 PM   Performing Physician: Thom Hall, MD Assistant(s): None  Diagnosis: UGIB. Duodenal ulcer with < Hb   Procedure(s):  MESENTERIC ARTERIOGRAPHY, including at CELIAC, COMMON HEPATIC and GASTRODUODENAL ARTERIES COIL EMBOLIZATION OF GASTRODUODENAL ARTERY   Anesthesia:  Single Agent sedation Complications: None Estimated Blood Loss: Minimal Specimens: None   Findings:  - access via the RIGHT femoral artery. - No angiographic evidence of active extravasation  - empiric coil embolization of GDA - Retained foreign body with the coil deployment string mechanism lodged within the CHA - AngioSeal closure at the R groin with distal RLE pulses at the end of the case.  *Pt had Rapid Response initiated for hypotension after large bloody BM. She responded after crystalloid and colloid resuscitation.   Plan: - Post sheath removal precautions.  - Bedrest with RLE straight x2hrs.  Final report to follow once all images are reviewed and compared with previous studies.  See detailed dictation with images in PACS. The patient tolerated the procedure well without incident or complication and was returned to Recovery in stable condition.    Thom Hall, MD [facility]   Pager. [phone] Clinic. [phone]

## 2023-02-11 NOTE — Progress Notes (Signed)
 [facility] here to transport to [facility] for IR procedure.

## 2023-02-11 NOTE — Consult Note (Signed)
 [facility] Consult  Reason for Consult: Bleeding ulcer s/p IR embolization  Referring Physician: Dr. Vernon  Chief Complaint   Hematochezia     HPI: Sarah Walker is a 73 y.o. female with a peptic ulcer that was seen by IR today for embolization. That was successful but at the time of the procedure she did have bloody Bms and a rapid response. She had previously undergone unsuccessful EGD and had been give blood with her H&H responding. She was transferred back up with 1 u pRBC running per the RN report. I cannot find documentation about blood but she did receive boluses in IR. She had a Hgb at 1412 of 5 which was down from 9.1.   When she arrived up here I was asked to come see her. The GI team had told me about the patient earlier today and  their concerns for potential perforation given the bleeding ulcer, but when I had gone to see her earlier around 12 noon she was already down for her IR embolization.  Currently she has no complaints but is cold. Dr. Lulla reports the procedure went well and she has a good embolization and that her hemodynamics improved greatly after that was completed.   Past Medical History:  Diagnosis Date   Abdominal hernia    Allergic rhinitis    Arthritis    Bone spur of other site    left foot   Dry eyes    Family history of kidney cancer    Family history of prostate cancer    Hypertension    Ovarian cancer (HCC)    Ovarian cancer on right (HCC) 06/17/2011   Peripheral neuropathy 08/26/2011   Chemotherapy-induced   Pulmonary embolism (HCC)    Sinusitis     Past Surgical History:  Procedure Laterality Date   ABDOMINAL HYSTERECTOMY  05/25/2011   [place] bs&o and cancer staging   CHOLECYSTECTOMY  01/05/1988   COLONOSCOPY N/A 09/06/2014   Procedure: COLONOSCOPY;  Surgeon: Claudis RAYMOND Rivet, MD;  Location: [facility];  Service: Endoscopy;  Laterality: N/A;  930 - moved to 9/2 @ 8:25   ESOPHAGOGASTRODUODENOSCOPY (EGD) WITH  PROPOFOL  N/A 02/10/2023   Procedure: ESOPHAGOGASTRODUODENOSCOPY (EGD) WITH PROPOFOL ;  Surgeon: Cinderella Deatrice FALCON, MD;  Location: [facility];  Service: Endoscopy;  Laterality: N/A;   I & D EXTREMITY Left 12/13/2021   Procedure: IRRIGATION AND DEBRIDEMENT ANKLE/ LEG;  Surgeon: Beverley Evalene BIRCH, MD;  Location: [facility] OR;  Service: Orthopedics;  Laterality: Left;   INCISIONAL HERNIA REPAIR  10/15/2014   INCISIONAL HERNIA REPAIR  10/15/2014   Procedure: OPEN INCISIONAL HERNIA REPAIR WITH MESH AND MYOFASCIAL FLAPS;  Surgeon: Debby Shipper, MD;  Location: [facility] OR;  Service: General;;   IVC filter  05/24/2011   PORT-A-CATH REMOVAL Right 11/21/2013   Procedure: MINOR REMOVAL PORT-A-CATH;  Surgeon: Oneil DELENA Budge, MD;  Location: [facility] ORS;  Service: General;  Laterality: Right;   PORTACATH PLACEMENT  06/16/2011   Done at [facility]   ROTATOR CUFF REPAIR Right    SCLEROTHERAPY  02/10/2023   Procedure: MATIAS;  Surgeon: Cinderella Deatrice FALCON, MD;  Location: [facility];  Service: Endoscopy;;   TONSILLECTOMY     age 43's   TOTAL HIP ARTHROPLASTY Right 08/26/2020   Procedure: TOTAL HIP ARTHROPLASTY ANTERIOR APPROACH;  Surgeon: Beverley Evalene BIRCH, MD;  Location: [facility] ORS;  Service: Orthopedics;  Laterality: Right;   TOTAL KNEE ARTHROPLASTY Left 07/20/2022   Procedure: TOTAL KNEE ARTHROPLASTY;  Surgeon: Beverley Evalene  D, MD;  Location: [facility] ORS;  Service: Orthopedics;  Laterality: Left;   TUBAL LIGATION  01/05/1979    Family History  Problem Relation Age of Onset   Stroke Mother    Cervical cancer Mother 32   Kidney cancer Father 8   Aneurysm Paternal Grandmother        51s   Hodgkin's lymphoma Maternal Uncle 33   Prostate cancer Paternal Uncle    Throat cancer Paternal Uncle    Cancer Cousin        2 female maternal first cousins with unknown cancer   Colon cancer Neg Hx    Stomach cancer Neg Hx     Social History   Tobacco Use   Smoking status: Never   Smokeless tobacco: Never  Vaping Use    Vaping status: Never Used  Substance Use Topics   Alcohol use: No   Drug use: No    Medications: I have reviewed the patient's current medications. Prior to Admission:  Medications Prior to Admission  Medication Sig Dispense Refill Last Dose/Taking   acitretin (SORIATANE) 25 MG capsule Take 25 mg by mouth daily.   Past Week   ALPRAZolam  (XANAX ) 0.5 MG tablet Take 0.5 mg by mouth at bedtime.   02/09/2023   aspirin  EC 81 MG tablet Take 81 mg by mouth daily. Swallow whole.   02/09/2023   azelastine (OPTIVAR) 0.05 % ophthalmic solution Place 1 drop into both eyes daily as needed (rag weed).  11 Past Month   bismuth  subsalicylate (PEPTO BISMOL) 262 MG/15ML suspension Take 30 mLs by mouth every 6 (six) hours as needed for indigestion.   Taking As Needed   fexofenadine (ALLEGRA) 180 MG tablet Take 180 mg by mouth daily as needed for allergies or rhinitis.   02/09/2023   fluticasone  (FLONASE ) 50 MCG/ACT nasal spray Place 1 spray into both nostrils daily as needed for allergies or rhinitis.   Past Week   HYDROcodone -acetaminophen  (NORCO/VICODIN) 5-325 MG tablet Take 1-2 tablets by mouth every 6 (six) hours as needed for moderate pain (pain score 4-6).   02/09/2023   KLOR-CON  M20 20 MEQ tablet TAKE 1 TABLET BY MOUTH EVERY DAY (Patient taking differently: Take 10 mEq by mouth daily.) 90 tablet 1 02/09/2023   meloxicam (MOBIC) 15 MG tablet Take 15 mg by mouth daily as needed for pain. PLEASE SEE ATTACHED FOR DETAILED DIRECTIONS   02/09/2023   mupirocin  ointment (BACTROBAN ) 2 % Apply 1 Application topically daily as needed.   Taking As Needed   ondansetron  (ZOFRAN ) 4 MG tablet Take 1 tablet (4 mg total) by mouth every 8 (eight) hours as needed for nausea or vomiting. 10 tablet 0 Past Month   sennosides-docusate sodium  (SENOKOT-S) 8.6-50 MG tablet Take 2 tablets by mouth daily. 30 tablet 1 02/09/2023   valsartan -hydrochlorothiazide  (DIOVAN -HCT) 320-12.5 MG tablet Take 1 tablet by mouth daily.   02/09/2023    acetaminophen  (TYLENOL ) 500 MG tablet Take 1,000 mg by mouth every 8 (eight) hours as needed for moderate pain.      Scheduled:  sodium chloride    Intravenous Once   sodium chloride    Intravenous Once   sodium chloride    Intravenous Once   ALPRAZolam   0.5 mg Oral QHS   Chlorhexidine  Gluconate Cloth  6 each Topical Q0600   leptospermum manuka honey  1 Application Topical Daily   pantoprazole  (PROTONIX ) IV  40 mg Intravenous Q12H   vitamin B-12  100 mcg Oral Daily   Continuous: PRN:acetaminophen  **OR** acetaminophen , albuterol , fluticasone ,  ondansetron  **OR** ondansetron  (ZOFRAN ) IV, oxyCODONE , zolpidem   Allergies  Allergen Reactions   Codeine Nausea And Vomiting    Not right away after a couple days of taking   Erythromycin Itching and Other (See Comments)     ROS:  A comprehensive review of systems was negative except for: Gastrointestinal: positive for abdominal pain Musculoskeletal: positive for leg and joint pain  Blood pressure (!) 99/43, pulse 86, temperature 97.7 F (36.5 C), temperature source Oral, resp. rate 12, height 5' (1.524 m), weight 73.8 kg, SpO2 98%. Physical Exam Vitals reviewed.  HENT:     Head: Normocephalic.     Nose: Nose normal.  Eyes:     Extraocular Movements: Extraocular movements intact.  Cardiovascular:     Rate and Rhythm: Normal rate.  Pulmonary:     Effort: Pulmonary effort is normal.     Breath sounds: Normal breath sounds.  Abdominal:     General: There is no distension.     Palpations: Abdomen is soft.     Tenderness: There is no abdominal tenderness.  Musculoskeletal:        General: Normal range of motion.     Cervical back: Normal range of motion.  Skin:    General: Skin is warm.  Neurological:     General: No focal deficit present.     Mental Status: She is alert.  Psychiatric:        Mood and Affect: Mood normal.        Behavior: Behavior normal.        Thought Content: Thought content normal.        Judgment: Judgment  normal.     Results: Results for orders placed or performed during the hospital encounter of 02/10/23 (from the past 48 hours)  Comprehensive metabolic panel     Status: Abnormal   Collection Time: 02/10/23 11:00 AM  Result Value Ref Range   Sodium 136 135 - 145 mmol/L   Potassium 5.3 (H) 3.5 - 5.1 mmol/L    Comment: HEMOLYSIS AT THIS LEVEL MAY AFFECT RESULT   Chloride 104 98 - 111 mmol/L   CO2 21 (L) 22 - 32 mmol/L   Glucose, Bld 103 (H) 70 - 99 mg/dL    Comment: Glucose reference range applies only to samples taken after fasting for at least 8 hours.   BUN 58 (H) 8 - 23 mg/dL   Creatinine, Ser 8.82 (H) 0.44 - 1.00 mg/dL   Calcium 8.5 (L) 8.9 - 10.3 mg/dL   Total Protein 6.1 (L) 6.5 - 8.1 g/dL   Albumin  2.9 (L) 3.5 - 5.0 g/dL   AST 22 15 - 41 U/L    Comment: HEMOLYSIS AT THIS LEVEL MAY AFFECT RESULT   ALT 10 0 - 44 U/L    Comment: HEMOLYSIS AT THIS LEVEL MAY AFFECT RESULT   Alkaline Phosphatase 39 38 - 126 U/L   Total Bilirubin 0.9 0.0 - 1.2 mg/dL    Comment: HEMOLYSIS AT THIS LEVEL MAY AFFECT RESULT   GFR, Estimated 49 (L) >60 mL/min    Comment: (NOTE) Calculated using the CKD-EPI Creatinine Equation (2021)    Anion gap 11 5 - 15    Comment: Performed at [facility], [address] [place], [place] [address]  CBC with Differential     Status: Abnormal   Collection Time: 02/10/23 11:00 AM  Result Value Ref Range   WBC 9.4 4.0 - 10.5 K/uL   RBC 2.45 (L) 3.87 - 5.11 MIL/uL   Hemoglobin  7.6 (L) 12.0 - 15.0 g/dL   HCT 75.4 (L) 63.9 - 53.9 %   MCV 100.0 80.0 - 100.0 fL   MCH 31.0 26.0 - 34.0 pg   MCHC 31.0 30.0 - 36.0 g/dL   RDW 84.3 (H) 88.4 - 84.4 %   Platelets 314 150 - 400 K/uL   nRBC 0.0 0.0 - 0.2 %   Neutrophils Relative % 84 %   Neutro Abs 7.9 (H) 1.7 - 7.7 K/uL   Lymphocytes Relative 11 %   Lymphs Abs 1.0 0.7 - 4.0 K/uL   Monocytes Relative 4 %   Monocytes Absolute 0.4 0.1 - 1.0 K/uL   Eosinophils Relative 0 %   Eosinophils Absolute 0.0 0.0 - 0.5 K/uL    Basophils Relative 0 %   Basophils Absolute 0.0 0.0 - 0.1 K/uL   Immature Granulocytes 1 %   Abs Immature Granulocytes 0.07 0.00 - 0.07 K/uL    Comment: Performed at [facility], [address], [place], [place] [address]  Type and screen [facility]     Status: None (Preliminary result)   Collection Time: 02/10/23 11:00 AM  Result Value Ref Range   ABO/RH(D) A POS    Antibody Screen NEG    Sample Expiration      02/13/2023,2359 Performed at [facility], [address], [place], [place] [address]    Unit Number [id number]    Blood Component Type RED CELLS,LR    Unit division 00    Status of Unit ISSUED,FINAL    Transfusion Status OK TO TRANSFUSE    Crossmatch Result Compatible    Unit Number [id number]    Blood Component Type RED CELLS,LR    Unit division 00    Status of Unit ISSUED,FINAL    Transfusion Status OK TO TRANSFUSE    Crossmatch Result Compatible    Unit Number [id number]    Blood Component Type RED CELLS,LR    Unit division 00    Status of Unit ALLOCATED    Transfusion Status OK TO TRANSFUSE    Crossmatch Result Compatible    Unit Number [id number]    Blood Component Type RED CELLS,LR    Unit division 00    Status of Unit ALLOCATED    Transfusion Status OK TO TRANSFUSE    Crossmatch Result Compatible   Vitamin B12     Status: Abnormal   Collection Time: 02/10/23 11:00 AM  Result Value Ref Range   Vitamin B-12 142 (L) 180 - 914 pg/mL    Comment: HEMOLYSIS AT THIS LEVEL MAY AFFECT RESULT (NOTE) This assay is not validated for testing neonatal or myeloproliferative syndrome specimens for Vitamin B12 levels. Performed at [facility], [address] [place], [place] [address]   Folate     Status: None   Collection Time: 02/10/23 11:00 AM  Result Value Ref Range   Folate 8.6 >5.9 ng/mL    Comment: HEMOLYSIS AT THIS LEVEL MAY AFFECT RESULT Performed at [facility], [address], [place], [place] [address]   Iron and TIBC     Status:  Abnormal   Collection Time: 02/10/23 11:00 AM  Result Value Ref Range   Iron 86 28 - 170 ug/dL    Comment: HEMOLYSIS AT THIS LEVEL MAY AFFECT RESULT   TIBC 249 (L) 250 - 450 ug/dL   Saturation Ratios 35 (H) 10.4 - 31.8 %   UIBC 163 ug/dL    Comment: Performed at [facility], [address], [place], [place] [address]  Ferritin     Status: None   Collection Time: 02/10/23 11:00 AM  Result Value Ref Range   Ferritin 74 11 - 307 ng/mL    Comment: Performed at [facility], [address] [place], [place] [address]  Reticulocytes     Status: Abnormal   Collection Time: 02/10/23 11:00 AM  Result Value Ref Range   Retic Ct Pct 1.3 0.4 - 3.1 %   RBC. 2.54 (L) 3.87 - 5.11 MIL/uL   Retic Count, Absolute 34.0 19.0 - 186.0 K/uL   Immature Retic Fract 18.2 (H) 2.3 - 15.9 %    Comment: Performed at [facility], [address] [place], [place] [address]  POC occult blood, ED     Status: Abnormal   Collection Time: 02/10/23 11:03 AM  Result Value Ref Range   Fecal Occult Bld POSITIVE (A) NEGATIVE  Hemoglobin and hematocrit, blood     Status: Abnormal   Collection Time: 02/10/23  4:52 PM  Result Value Ref Range   Hemoglobin 6.1 (LL) 12.0 - 15.0 g/dL    Comment: REPEATED TO VERIFY THIS CRITICAL RESULT HAS VERIFIED AND BEEN CALLED TO L FLORES RN BY KIRSTENE FORSYTH ON 02 06 2025 AT 1720, AND HAS BEEN READ BACK.     HCT 19.6 (L) 36.0 - 46.0 %    Comment: Performed at [facility], [address] [place], [place] [address]  Prepare RBC (crossmatch)     Status: None   Collection Time: 02/10/23  5:25 PM  Result Value Ref Range   Order Confirmation      ORDER PROCESSED BY BLOOD BANK Performed at [facility], [address] [place], [place] [address]   MRSA Next Gen by PCR, Nasal     Status: None   Collection Time: 02/10/23  6:00 PM   Specimen: Nasal Mucosa; Nasal Swab  Result Value Ref Range   MRSA by PCR Next Gen NOT DETECTED NOT DETECTED    Comment: (NOTE) The GeneXpert MRSA Assay (FDA  approved for NASAL specimens only), is one component of a comprehensive MRSA colonization surveillance program. It is not intended to diagnose MRSA infection nor to guide or monitor treatment for MRSA infections. Test performance is not FDA approved in patients less than 53 years old. Performed at [facility], [address] [place], [place] [address]   Basic metabolic panel     Status: Abnormal   Collection Time: 02/11/23  3:34 AM  Result Value Ref Range   Sodium 136 135 - 145 mmol/L   Potassium 4.1 3.5 - 5.1 mmol/L   Chloride 106 98 - 111 mmol/L   CO2 23 22 - 32 mmol/L   Glucose, Bld 90 70 - 99 mg/dL    Comment: Glucose reference range applies only to samples taken after fasting for at least 8 hours.   BUN 48 (H) 8 - 23 mg/dL   Creatinine, Ser 9.12 0.44 - 1.00 mg/dL   Calcium 8.2 (L) 8.9 - 10.3 mg/dL   GFR, Estimated >39 >39 mL/min    Comment: (NOTE) Calculated using the CKD-EPI Creatinine Equation (2021)    Anion gap 7 5 - 15    Comment: Performed at [facility], [address] [place], [place] [address]  CBC     Status: Abnormal   Collection Time: 02/11/23  3:34 AM  Result Value Ref Range   WBC 8.1 4.0 - 10.5 K/uL   RBC 2.95 (L) 3.87 - 5.11 MIL/uL   Hemoglobin 8.7 (L) 12.0 - 15.0  g/dL    Comment: REPEATED TO VERIFY POST TRANSFUSION SPECIMEN    HCT 26.2 (L) 36.0 - 46.0 %   MCV 88.8 80.0 - 100.0 fL    Comment: POST TRANSFUSION SPECIMEN REPEATED TO VERIFY    MCH 29.5 26.0 - 34.0 pg   MCHC 33.2 30.0 - 36.0 g/dL   RDW 80.3 (H) 88.4 - 84.4 %   Platelets 284 150 - 400 K/uL   nRBC 0.0 0.0 - 0.2 %    Comment: Performed at [facility], [address], [place], [place] [address]  Protime-INR     Status: None   Collection Time: 02/11/23  9:35 AM  Result Value Ref Range   Prothrombin Time 14.2 11.4 - 15.2 seconds   INR 1.1 0.8 - 1.2    Comment: (NOTE) INR goal varies based on device and disease states. Performed at [facility], [address], [place], [place] [address]    Hemoglobin and hematocrit, blood     Status: Abnormal   Collection Time: 02/11/23 12:00 PM  Result Value Ref Range   Hemoglobin 9.1 (L) 12.0 - 15.0 g/dL   HCT 71.8 (L) 63.9 - 53.9 %    Comment: Performed at [facility], [address], [place], [place] [address]  CBC     Status: Abnormal   Collection Time: 02/11/23  2:12 PM  Result Value Ref Range   WBC 8.8 4.0 - 10.5 K/uL   RBC 1.72 (L) 3.87 - 5.11 MIL/uL   Hemoglobin 5.0 (LL) 12.0 - 15.0 g/dL    Comment: REPEATED TO VERIFY THIS CRITICAL RESULT HAS VERIFIED AND BEEN CALLED TO KASEY WALKER, RN BY [employer] ON 02 07 2025 AT 1446, AND HAS BEEN READ BACK.     HCT 15.7 (L) 36.0 - 46.0 %   MCV 91.3 80.0 - 100.0 fL   MCH 29.1 26.0 - 34.0 pg   MCHC 31.8 30.0 - 36.0 g/dL   RDW 79.8 (H) 88.4 - 84.4 %   Platelets 264 150 - 400 K/uL   nRBC 0.0 0.0 - 0.2 %    Comment: Performed at [facility], [address] [place], [place] [address]  Type and screen [facility]     Status: None (Preliminary result)   Collection Time: 02/11/23  2:58 PM  Result Value Ref Range   ABO/RH(D) A POS    Antibody Screen NEG    Sample Expiration 02/14/2023,2359    Unit Number [id number]    Blood Component Type RBC LR PHER1    Unit division 00    Status of Unit REL [facility]    Unit tag comment EMERGENCY RELEASE    Transfusion Status OK TO TRANSFUSE    Crossmatch Result COMPATIBLE    Unit Number [id number]    Blood Component Type RBC LR PHER1    Unit division 00    Status of Unit ISSUED    Unit tag comment EMERGENCY RELEASE    Transfusion Status OK TO TRANSFUSE    Crossmatch Result COMPATIBLE   Prepare RBC     Status: None   Collection Time: 02/11/23  2:58 PM  Result Value Ref Range   Order Confirmation      ORDER PROCESSED BY BLOOD BANK Performed at [facility], [address] [place], [place] [address]   Prepare RBC (crossmatch)     Status: None   Collection Time: 02/11/23  4:15 PM  Result Value Ref  Range   Order Confirmation  BB SAMPLE OR UNITS ALREADY AVAILABLE Performed at [facility], [address] [place], [place] [address]   Prepare RBC (crossmatch)     Status: None   Collection Time: 02/11/23  5:00 PM  Result Value Ref Range   Order Confirmation      ORDER PROCESSED BY BLOOD BANK Performed at [facility], [address] [place], [place] [address]     CT ANGIO GI BLEED Addendum Date: 02/10/2023 ADDENDUM REPORT: 02/10/2023 16:14 ADDENDUM: Critical Value/emergent results were called by telephone at the time of interpretation on 02/10/2023 at 4:13 pm to provider ERIC AUSTRIA , who verbally acknowledged these results. Electronically Signed   By: Lynwood Landy Raddle M.D.   On: 02/10/2023 16:14   Result Date: 02/10/2023 CLINICAL DATA:  Upper gastrointestinal bleeding for 2 days. EXAM: CTA ABDOMEN AND PELVIS WITHOUT AND WITH CONTRAST TECHNIQUE: Multidetector CT imaging of the abdomen and pelvis was performed using the standard protocol during bolus administration of intravenous contrast. Multiplanar reconstructed images and MIPs were obtained and reviewed to evaluate the vascular anatomy. RADIATION DOSE REDUCTION: This exam was performed according to the departmental dose-optimization program which includes automated exposure control, adjustment of the mA and/or kV according to patient size and/or use of iterative reconstruction technique. CONTRAST:  OMNIPAQUE  IOHEXOL  350 MG/ML SOLN COMPARISON:  July 19, 2014. FINDINGS: VASCULAR Aorta: Normal caliber aorta without aneurysm, dissection, vasculitis or significant stenosis. Celiac: Patent without evidence of aneurysm, dissection, vasculitis or significant stenosis. SMA: Patent without evidence of aneurysm, dissection, vasculitis or significant stenosis. Renals: Both renal arteries are patent without evidence of aneurysm, dissection, vasculitis, fibromuscular dysplasia or significant stenosis. IMA: Patent without evidence of aneurysm,  dissection, vasculitis or significant stenosis. Inflow: Patent without evidence of aneurysm, dissection, vasculitis or significant stenosis. Proximal Outflow: Bilateral common femoral and visualized portions of the superficial and profunda femoral arteries are patent without evidence of aneurysm, dissection, vasculitis or significant stenosis. Veins: No obvious venous abnormality within the limitations of this arterial phase study. Review of the MIP images confirms the above findings. NON-VASCULAR Lower chest: No acute abnormality. Hepatobiliary: Multiple hepatic cysts are again noted. Status post cholecystectomy. No biliary dilatation is noted. Pancreas: Unremarkable. No pancreatic ductal dilatation or surrounding inflammatory changes. Spleen: Normal in size without focal abnormality. Adrenals/Urinary Tract: Adrenal glands are unremarkable. Kidneys are normal, without renal calculi, focal lesion, or hydronephrosis. Bladder is unremarkable. Stomach/Bowel: There is no evidence of bowel obstruction. There is no clear extravasation of contrast seen on this exam, although a small high density focus is seen anterior to the gastroduodenal branch of the celiac artery posterior to the stomach, best seen on image number 75 of series 6. This potentially may represent a small branch, but small focus of extravasation cannot be excluded, although it is not clearly visualized on more delayed imaging. Mild inflammatory changes are seen involving the distal stomach and proximal duodenum with mildly enlarged lymph nodes. The appendix is unremarkable. Lymphatic: No adenopathy is noted. Reproductive: Status post hysterectomy. No adnexal masses. Other: Findings consistent with prior ventral hernia repair in the pelvis. No ascites. Musculoskeletal: Status post right hip arthroplasty. No acute osseous abnormality seen. IMPRESSION: VASCULAR As noted above, there is a very small high density focus seen anterior to the gastroduodenal branch  of the celiac artery which is posterior to the stomach. This may simply represent a very small branch of the artery, but small focus of extravasation cannot be excluded, although it is not clearly visualized on more delayed  imaging of this area. These results will be called to the ordering clinician or representative by the Radiologist Assistant, and communication documented in the PACS or zVision Dashboard. NON-VASCULAR Mild inflammatory changes are seen involving the distal stomach and proximal duodenum with mildly enlarged lymph nodes, suggesting peptic ulcer disease. Electronically Signed: By: Lynwood Landy Raddle M.D. On: 02/10/2023 16:05     Assessment & Plan:  MAGUADALUPE LATA is a 73 y.o. female with embolized peptic ulcer. Abdominal exam benign. Getting more blood now. Vitals all reassuring. No signs of any perforation.  -Keep Npo for now -Stat H&H, INR and new type and screen ordered (needs new bracelet per RN) -Would continue to resuscitate with blood and check H&H   All questions were answered to the satisfaction of the patient.    Manuelita JAYSON Pander 02/11/2023, 6:34 PM

## 2023-02-11 NOTE — TOC CM/SW Note (Signed)
 Transition of Care [facility]) - Inpatient Brief Assessment   Patient Details  Name: Sarah Walker MRN: [id number] Date of Birth: 12/10/1950  Transition of Care [facility]) CM/SW Contact:    Lucie Lunger, LCSWA Phone Number: 02/11/2023, 10:39 AM   Clinical Narrative: Transition of Care Department [facility]) has reviewed patient and no TOC needs have been identified at this time. We will continue to monitor patient advancement through interdiciplinary progression rounds. If new patient transition needs arise, please place a TOC consult.  Transition of Care Asessment: Insurance and Status: Insurance coverage has been reviewed Patient has primary care physician: Yes Home environment has been reviewed: From home Prior level of function:: Independent Prior/Current Home Services: No current home services Social Drivers of Health Review: SDOH reviewed no interventions necessary Readmission risk has been reviewed: Yes Transition of care needs: no transition of care needs at this time

## 2023-02-11 NOTE — Progress Notes (Signed)
 PROGRESS NOTE    Sarah Walker  FMW:[id number] DOB: 1950-12-02 DOA: 02/10/2023 PCP: Marvine Rush, MD   Brief Narrative:  HPI: Sarah Walker is a 73 y.o. female with medical history significant of HTN, anxiety, chronic knee pain, history of ovarian cancer s/p hysterectomy/oophorectomy and chemotherapy 2013 in remission, neuropathy, provoked pulmonary embolism 2013 in which she completed anticoagulation; and currently being worked up by rheumatology outpatient for concern of psoriatic arthritis who presented to [facility] ED on 2/6 via EMS with dark stools, epigastric abdominal pain.  Onset yesterday, thought that her stool was black due to taking hydrocodone  and a stool softener.  Pain localized in her epigastric region and reports some radiation towards her back.  Patient denies NSAID abuse, no BC or Goody powder.  Does utilize a baby aspirin  at baseline.  Also endorses generalized weakness, fatigue.  No longer on anticoagulation since treatment for pulmonary embolism in 2013.  Denies headache, no dizziness, no chest pain, no palpitations, no shortness of breath, no fever/chills/night sweats, no nausea/vomiting/diarrhea, no focal weakness, no paresthesias.     In the ED, temperature 98.0 F, HR 109, RR 18, BP 199/76, SpO2 99% on room air.  WBC 9.4, hemoglobin 7.6, platelet count 314, MCV 100.0.  Sodium 136, potassium 5.3, chloride 104, CO2 21, glucose 103, BUN 58, creatinine 1.17.  AST 22, ALT 10, total bilirubin 0.9.  FOBT positive.  GI was consulted.  Patient was given Protonix  40 mg IV x 1.  EDP consulted [facility] for admission for further evaluation and management of concern for upper GI bleed.  Assessment & Plan:   Principal Problem:   Acute upper GI bleed Active Problems:   Duodenal ulcer   Acute GI bleeding  Acute blood loss anemia secondary to upper GI bleed/duodenal ulcer, POA: Patient presented with dark tarry stools for 1 day.  On aspirin  81 mg p.o. daily outpatient.  No other  antiplatelets or anticoagulants.  Denies NSAID use including Goody or BC powder.  Hemoglobin 7.4 on admission, last 10.7 on 07/22/2022.  BUN elevated.  Consistent with upper GI bleed.  Hemoglobin dropped to 6.1 shortly on 02/10/2023, patient received 2 units of PRBC transfusion.  Hemoglobin improved to 8.7.  Patient started on Protonix  IV twice daily.  GI consulted, patient underwent EGD, per GI, nonobstructing large duodenal ulcer with large EtheDent clot, unroofing was not attempted.  Perforation of the bowel wall could not be ruled out, injected, per GI, this is either a large peptic ulcer versus mass versus walled off perforation, GI recommended stat CT abdomen and pelvis with IV contrast and Protonix  was switched to drip.  Patient transferred to ICU.  CT angiogram GI bleed study with high density focus anterior to gastroduodenal branch celiac artery posterior to the stomach concerning for possible extravasation.  Previous hospitalist Dr. Austria discussed with interventional radiology, Dr. Jennefer who reviewed imaging.  Dr. Jennefer does not believe there is any concerning findings to suggest extravasation at this time.  Given patient is hemodynamically stable, without hemoptysis recommends conservative treatment at this time.  But does decompensate could consider prophylactic embolization of the GDA.   However per patient, radiology came in today and they are transferring her for embolization to [facility] and then she will be back.  This was verified by the nurse as well.  GI considering repeat EGD at some point in time if still needed after embolization.  Patient hemodynamically stable.  Vitamin B12 deficiency: Start supplement.  Hyperkalemia Resolved  Essential hypertension On valsartan -HCTZ 320-12.5 mg p.o. daily at baseline PTA.  BP initially soft on admission.  Improving but continue to hold antihypertensives for now due to risk of further hypotension due to GI bleed.   Chronic knee pain --  Tylenol /oxycodone  as needed -- Avoid NSAIDs as above   History of ovarian cancer  s/p hysterectomy/oophorectomy and chemotherapy 2013 in remission,    Hx provoked pulmonary embolism 2013 No longer on anticoagulation   Concern for psoriatic arthritis Currently followed by rheumatology outpatient, seen in clinic 02/09/2023 with workup underway.  DVT prophylaxis: SCDs Start: 02/10/23 1419   Code Status: Full Code  Family Communication: Patient's son present at bedside.  Plan of care discussed with patient in length and he/she verbalized understanding and agreed with it.  Status is: Inpatient Remains inpatient appropriate because: Patient is scheduled for embolization at [facility] today.   Estimated body mass index is 31.78 kg/m as calculated from the following:   Height as of this encounter: 5' (1.524 m).   Weight as of this encounter: 73.8 kg.  Pressure Injury 02/10/23 Buttocks Right;Upper Stage 3 -  Full thickness tissue loss. Subcutaneous fat may be visible but bone, tendon or muscle are NOT exposed. pink, red, white, open (Active)  02/10/23 1700  Location: Buttocks  Location Orientation: Right;Upper  Staging: Stage 3 -  Full thickness tissue loss. Subcutaneous fat may be visible but bone, tendon or muscle are NOT exposed.  Wound Description (Comments): pink, red, white, open  Present on Admission: Yes  Dressing Type Foam - Lift dressing to assess site every shift 02/10/23 1925     Pressure Injury 02/10/23 Buttocks Right;Mid Stage 3 -  Full thickness tissue loss. Subcutaneous fat may be visible but bone, tendon or muscle are NOT exposed. Pink, red, white, open (Active)  02/10/23 1700  Location: Buttocks  Location Orientation: Right;Mid  Staging: Stage 3 -  Full thickness tissue loss. Subcutaneous fat may be visible but bone, tendon or muscle are NOT exposed.  Wound Description (Comments): Pink, red, white, open  Present on Admission: Yes  Dressing Type Foam - Lift dressing to  assess site every shift 02/10/23 1925     Pressure Injury 02/10/23 Buttocks Right;Lower Stage 3 -  Full thickness tissue loss. Subcutaneous fat may be visible but bone, tendon or muscle are NOT exposed. Pink, red, white, open (Active)  02/10/23 1700  Location: Buttocks  Location Orientation: Right;Lower  Staging: Stage 3 -  Full thickness tissue loss. Subcutaneous fat may be visible but bone, tendon or muscle are NOT exposed.  Wound Description (Comments): Pink, red, white, open  Present on Admission: Yes  Dressing Type Foam - Lift dressing to assess site every shift 02/10/23 1925   Nutritional Assessment: Body mass index is 31.78 kg/m.SABRA Seen by dietician.  I agree with the assessment and plan as outlined below: Nutrition Status:        . Skin Assessment: I have examined the patient's skin and I agree with the wound assessment as performed by the wound care RN as outlined below: Pressure Injury 02/10/23 Buttocks Right;Upper Stage 3 -  Full thickness tissue loss. Subcutaneous fat may be visible but bone, tendon or muscle are NOT exposed. pink, red, white, open (Active)  02/10/23 1700  Location: Buttocks  Location Orientation: Right;Upper  Staging: Stage 3 -  Full thickness tissue loss. Subcutaneous fat may be visible but bone, tendon or muscle are NOT exposed.  Wound Description (Comments): pink, red, white, open  Present on  Admission: Yes  Dressing Type Foam - Lift dressing to assess site every shift 02/10/23 1925     Pressure Injury 02/10/23 Buttocks Right;Mid Stage 3 -  Full thickness tissue loss. Subcutaneous fat may be visible but bone, tendon or muscle are NOT exposed. Pink, red, white, open (Active)  02/10/23 1700  Location: Buttocks  Location Orientation: Right;Mid  Staging: Stage 3 -  Full thickness tissue loss. Subcutaneous fat may be visible but bone, tendon or muscle are NOT exposed.  Wound Description (Comments): Pink, red, white, open  Present on Admission: Yes   Dressing Type Foam - Lift dressing to assess site every shift 02/10/23 1925     Pressure Injury 02/10/23 Buttocks Right;Lower Stage 3 -  Full thickness tissue loss. Subcutaneous fat may be visible but bone, tendon or muscle are NOT exposed. Pink, red, white, open (Active)  02/10/23 1700  Location: Buttocks  Location Orientation: Right;Lower  Staging: Stage 3 -  Full thickness tissue loss. Subcutaneous fat may be visible but bone, tendon or muscle are NOT exposed.  Wound Description (Comments): Pink, red, white, open  Present on Admission: Yes  Dressing Type Foam - Lift dressing to assess site every shift 02/10/23 1925    Consultants:  GI and IR  Procedures:  EGD  Antimicrobials:  Anti-infectives (From admission, onward)    None         Subjective: Patient seen and examined, she has not had any bowel movement since the EGD yesterday.  Son at the bedside.  Patient has no complaints at all.  Objective: Vitals:   02/11/23 0437 02/11/23 0525 02/11/23 0600 02/11/23 0756  BP: (!) 108/53 (!) 96/46 (!) 138/54   Pulse: 83 84 80   Resp: 16 14 16    Temp: 97.9 F (36.6 C)   98.7 F (37.1 C)  TempSrc: Axillary   Oral  SpO2: 94% 96% 100%   Weight:   73.8 kg   Height:        Intake/Output Summary (Last 24 hours) at 02/11/2023 0758 Last data filed at 02/11/2023 0600 Gross per 24 hour  Intake 1452.17 ml  Output 650 ml  Net 802.17 ml   Filed Weights   02/10/23 0939 02/10/23 1246 02/11/23 0600  Weight: 89.8 kg 76.7 kg 73.8 kg    Examination:  General exam: Appears calm and comfortable  Respiratory system: Clear to auscultation. Respiratory effort normal. Cardiovascular system: S1 & S2 heard, RRR. No JVD, murmurs, rubs, gallops or clicks. No pedal edema. Gastrointestinal system: Abdomen is nondistended, soft and nontender. No organomegaly or masses felt. Normal bowel sounds heard. Central nervous system: Alert and oriented. No focal neurological deficits. Extremities:  Symmetric 5 x 5 power. Skin: No rashes, lesions or ulcers Psychiatry: Judgement and insight appear normal. Mood & affect appropriate.    Data Reviewed: I have personally reviewed following labs and imaging studies  CBC: Recent Labs  Lab 02/10/23 1100 02/10/23 1652 02/11/23 0334  WBC 9.4  --  8.1  NEUTROABS 7.9*  --   --   HGB 7.6* 6.1* 8.7*  HCT 24.5* 19.6* 26.2*  MCV 100.0  --  88.8  PLT 314  --  284   Basic Metabolic Panel: Recent Labs  Lab 02/10/23 1100 02/11/23 0334  NA 136 136  K 5.3* 4.1  CL 104 106  CO2 21* 23  GLUCOSE 103* 90  BUN 58* 48*  CREATININE 1.17* 0.87  CALCIUM 8.5* 8.2*   GFR: Estimated Creatinine Clearance: 51.6 mL/min (by C-G formula based  on SCr of 0.87 mg/dL). Liver Function Tests: Recent Labs  Lab 02/10/23 1100  AST 22  ALT 10  ALKPHOS 39  BILITOT 0.9  PROT 6.1*  ALBUMIN  2.9*   No results for input(s): LIPASE, AMYLASE in the last 168 hours. No results for input(s): AMMONIA in the last 168 hours. Coagulation Profile: No results for input(s): INR, PROTIME in the last 168 hours. Cardiac Enzymes: No results for input(s): CKTOTAL, CKMB, CKMBINDEX, TROPONINI in the last 168 hours. BNP (last 3 results) No results for input(s): PROBNP in the last 8760 hours. HbA1C: No results for input(s): HGBA1C in the last 72 hours. CBG: No results for input(s): GLUCAP in the last 168 hours. Lipid Profile: No results for input(s): CHOL, HDL, LDLCALC, TRIG, CHOLHDL, LDLDIRECT in the last 72 hours. Thyroid  Function Tests: No results for input(s): TSH, T4TOTAL, FREET4, T3FREE, THYROIDAB in the last 72 hours. Anemia Panel: Recent Labs    02/10/23 1100  VITAMINB12 142*  FOLATE 8.6  FERRITIN 74  TIBC 249*  IRON 86  RETICCTPCT 1.3   Sepsis Labs: No results for input(s): PROCALCITON, LATICACIDVEN in the last 168 hours.  Recent Results (from the past 240 hours)  MRSA Next Gen by PCR, Nasal      Status: None   Collection Time: 02/10/23  6:00 PM   Specimen: Nasal Mucosa; Nasal Swab  Result Value Ref Range Status   MRSA by PCR Next Gen NOT DETECTED NOT DETECTED Final    Comment: (NOTE) The GeneXpert MRSA Assay (FDA approved for NASAL specimens only), is one component of a comprehensive MRSA colonization surveillance program. It is not intended to diagnose MRSA infection nor to guide or monitor treatment for MRSA infections. Test performance is not FDA approved in patients less than 48 years old. Performed at [facility], [address] [place], [place] [address]      Radiology Studies: CT ANGIO GI BLEED Addendum Date: 02/10/2023 ADDENDUM REPORT: 02/10/2023 16:14 ADDENDUM: Critical Value/emergent results were called by telephone at the time of interpretation on 02/10/2023 at 4:13 pm to provider ERIC AUSTRIA , who verbally acknowledged these results. Electronically Signed   By: Lynwood Landy Raddle M.D.   On: 02/10/2023 16:14   Result Date: 02/10/2023 CLINICAL DATA:  Upper gastrointestinal bleeding for 2 days. EXAM: CTA ABDOMEN AND PELVIS WITHOUT AND WITH CONTRAST TECHNIQUE: Multidetector CT imaging of the abdomen and pelvis was performed using the standard protocol during bolus administration of intravenous contrast. Multiplanar reconstructed images and MIPs were obtained and reviewed to evaluate the vascular anatomy. RADIATION DOSE REDUCTION: This exam was performed according to the departmental dose-optimization program which includes automated exposure control, adjustment of the mA and/or kV according to patient size and/or use of iterative reconstruction technique. CONTRAST:  OMNIPAQUE  IOHEXOL  350 MG/ML SOLN COMPARISON:  July 19, 2014. FINDINGS: VASCULAR Aorta: Normal caliber aorta without aneurysm, dissection, vasculitis or significant stenosis. Celiac: Patent without evidence of aneurysm, dissection, vasculitis or significant stenosis. SMA: Patent without evidence of aneurysm,  dissection, vasculitis or significant stenosis. Renals: Both renal arteries are patent without evidence of aneurysm, dissection, vasculitis, fibromuscular dysplasia or significant stenosis. IMA: Patent without evidence of aneurysm, dissection, vasculitis or significant stenosis. Inflow: Patent without evidence of aneurysm, dissection, vasculitis or significant stenosis. Proximal Outflow: Bilateral common femoral and visualized portions of the superficial and profunda femoral arteries are patent without evidence of aneurysm, dissection, vasculitis or significant stenosis. Veins: No obvious venous abnormality within the limitations of this arterial phase study. Review of the MIP  images confirms the above findings. NON-VASCULAR Lower chest: No acute abnormality. Hepatobiliary: Multiple hepatic cysts are again noted. Status post cholecystectomy. No biliary dilatation is noted. Pancreas: Unremarkable. No pancreatic ductal dilatation or surrounding inflammatory changes. Spleen: Normal in size without focal abnormality. Adrenals/Urinary Tract: Adrenal glands are unremarkable. Kidneys are normal, without renal calculi, focal lesion, or hydronephrosis. Bladder is unremarkable. Stomach/Bowel: There is no evidence of bowel obstruction. There is no clear extravasation of contrast seen on this exam, although a small high density focus is seen anterior to the gastroduodenal branch of the celiac artery posterior to the stomach, best seen on image number 75 of series 6. This potentially may represent a small branch, but small focus of extravasation cannot be excluded, although it is not clearly visualized on more delayed imaging. Mild inflammatory changes are seen involving the distal stomach and proximal duodenum with mildly enlarged lymph nodes. The appendix is unremarkable. Lymphatic: No adenopathy is noted. Reproductive: Status post hysterectomy. No adnexal masses. Other: Findings consistent with prior ventral hernia repair in  the pelvis. No ascites. Musculoskeletal: Status post right hip arthroplasty. No acute osseous abnormality seen. IMPRESSION: VASCULAR As noted above, there is a very small high density focus seen anterior to the gastroduodenal branch of the celiac artery which is posterior to the stomach. This may simply represent a very small branch of the artery, but small focus of extravasation cannot be excluded, although it is not clearly visualized on more delayed imaging of this area. These results will be called to the ordering clinician or representative by the Radiologist Assistant, and communication documented in the PACS or zVision Dashboard. NON-VASCULAR Mild inflammatory changes are seen involving the distal stomach and proximal duodenum with mildly enlarged lymph nodes, suggesting peptic ulcer disease. Electronically Signed: By: Lynwood Landy Raddle M.D. On: 02/10/2023 16:05    Scheduled Meds:  ALPRAZolam   0.5 mg Oral QHS   Chlorhexidine  Gluconate Cloth  6 each Topical Q0600   leptospermum manuka honey  1 Application Topical Daily   pantoprazole  (PROTONIX ) IV  40 mg Intravenous Q12H   Continuous Infusions:   LOS: 1 day   Fredia Skeeter, MD [facility]  02/11/2023, 7:58 AM   *Please note that this is a verbal dictation therefore any spelling or grammatical errors are due to the Dragon Medical One system interpretation.  Please page via Amion and do not message via secure chat for urgent patient care matters. Secure chat can be used for non urgent patient care matters.  How to contact the [facility] Attending or Consulting provider 7A - 7P or covering provider during after hours 7P -7A, for this patient?  Check the care team in [facility] and look for a) attending/consulting [facility] provider listed and b) the [facility] team listed. Page or secure chat 7A-7P. Log into [email or web address] and use [facility]'s universal password to access. If you do not have the password, please contact the hospital operator. Locate the [facility]  provider you are looking for under [facility] and page to a number that you can be directly reached. If you still have difficulty reaching the provider, please page the [facility] (Director on Call) for the Hospitalists listed on amion for assistance.

## 2023-02-11 NOTE — Sedation Documentation (Signed)
 Patient had large bloody bowel movement with multiple large clots. Patient still complaining of nausea and abdominal pain at this time.

## 2023-02-11 NOTE — Sedation Documentation (Signed)
 Patient transported via [facility]. Femoral site assessed - Level 0, no hematoma, dressing is clean, dry, and intact. Pulses also assessed bilaterally.

## 2023-02-11 NOTE — Progress Notes (Signed)
 [facility]  H&h is up but still critical. IR is elevated to 1.5. I think she could likely benefit from FFP in addition to the two units more prbc.  Have messaged the nurse and MDs covering tonight.  Benign abdomen, no surgical intervention indicated at this time  Needs blood resuscitation   Manuelita Pander MD

## 2023-02-11 NOTE — Consult Note (Addendum)
 Chief Complaint: Gastrointestinal bleeding. Request is for arteriogram with possible embolization.   Referring Physician(s): Dr. Eric Austria  Supervising Physician: Hughes Simmonds  Patient Status: [facility] - inpatient  History of Present Illness: Sarah Walker is a 73 y.o. female 73 y.o female inpatient History of PE ( not on anticoagulation)  ovarian cancer  s/p hysterectomy/oophorectomy,  HTN. Being worked up for psoriatic arthritis Presented to the ED at [facility] on 2.6.25 with melena and lighhedness X 2 days  and intermittent vomiting X 1-3 weeks.  CT Abd pelvis Me from 2..24 reads As noted above, there is a very small high density focus seen anterior to the gastroduodenal branch of the celiac artery which is posterior to the stomach. This may simply represent a very small branch of the artery, but small focus of extravasation cannot be excluded, although it is not clearly visualized on more delayed imaging of this area. EGD performed on 2.6.25 reads Large amount of Clotted blood in the entire stomach, unable to be suctioned hence inadequate exam  - Non- obstructing large duodenal ulcer with large adherent clot; unroofing was not attempted. Perforation of the bowel wall cannot be ruled out. Injected. This is either large peptic ulcer vs mass vs walled off perforation. Team is requesting an arteriogram with possible embolization. Case reviewed and approved by IR Attending Dr. Simmonds Hughes.   Currently without any significant complaints.States she has not had a bowel movement. States that she is tired of laying here Would like to know if she can get if bed. Patient alert and laying in bed,calm. Denies any fevers, headache, chest pain, SOB, cough, abdominal pain, nausea, vomiting or bleeding.   Hgb 8.7< 6.1< 7.6, s/p transfusion of 2 units of PRBC.  BUN 48. Patient is on 81 mg of ASA. Last dose on 2.5.25. Allergies include erythromycin and codeine. Patient has been NPO since midnight.   Return  precautions and treatment recommendations and follow-up discussed with the patient  who is agreeable with the plan.  Patient is a full code  Past Medical History:  Diagnosis Date   Abdominal hernia    Allergic rhinitis    Arthritis    Bone spur of other site    left foot   Dry eyes    Family history of kidney cancer    Family history of prostate cancer    Hypertension    Ovarian cancer (HCC)    Ovarian cancer on right (HCC) 06/17/2011   Peripheral neuropathy 08/26/2011   Chemotherapy-induced   Pulmonary embolism (HCC)    Sinusitis     Past Surgical History:  Procedure Laterality Date   ABDOMINAL HYSTERECTOMY  05/25/2011   [place] bs&[facility] and cancer staging   CHOLECYSTECTOMY  01/05/1988   COLONOSCOPY N/A 09/06/2014   Procedure: COLONOSCOPY;  Surgeon: Claudis RAYMOND Rivet, MD;  Location: [facility];  Service: Endoscopy;  Laterality: N/A;  930 - moved to 9/2 @ 8:25   I & D EXTREMITY Left 12/13/2021   Procedure: IRRIGATION AND DEBRIDEMENT ANKLE/ LEG;  Surgeon: Beverley Evalene BIRCH, MD;  Location: [facility] OR;  Service: Orthopedics;  Laterality: Left;   INCISIONAL HERNIA REPAIR  10/15/2014   INCISIONAL HERNIA REPAIR  10/15/2014   Procedure: OPEN INCISIONAL HERNIA REPAIR WITH MESH AND MYOFASCIAL FLAPS;  Surgeon: Debby Shipper, MD;  Location: [facility] OR;  Service: General;;   IVC filter  05/24/2011   PORT-A-CATH REMOVAL Right 11/21/2013   Procedure: MINOR REMOVAL PORT-A-CATH;  Surgeon: Oneil DELENA Budge, MD;  Location: [facility] ORS;  Service: General;  Laterality: Right;   PORTACATH PLACEMENT  06/16/2011   Done at [facility]   ROTATOR CUFF REPAIR Right    TONSILLECTOMY     age 40's   TOTAL HIP ARTHROPLASTY Right 08/26/2020   Procedure: TOTAL HIP ARTHROPLASTY ANTERIOR APPROACH;  Surgeon: Beverley Evalene BIRCH, MD;  Location: [facility] ORS;  Service: Orthopedics;  Laterality: Right;   TOTAL KNEE ARTHROPLASTY Left 07/20/2022   Procedure: TOTAL KNEE ARTHROPLASTY;  Surgeon: Beverley Evalene BIRCH, MD;  Location: [facility] ORS;   Service: Orthopedics;  Laterality: Left;   TUBAL LIGATION  01/05/1979    Allergies: Codeine and Erythromycin  Medications: Prior to Admission medications   Medication Sig Start Date End Date Taking? Authorizing Provider  acitretin (SORIATANE) 25 MG capsule Take 25 mg by mouth daily. 06/17/22  Yes [provider]  ALPRAZolam  (XANAX ) 0.5 MG tablet Take 0.5 mg by mouth at bedtime. 01/13/23  Yes [provider]  aspirin  EC 81 MG tablet Take 81 mg by mouth daily. Swallow whole.   Yes [provider]  azelastine (OPTIVAR) 0.05 % ophthalmic solution Place 1 drop into both eyes daily as needed (rag weed). 07/02/14  Yes [provider]  bismuth  subsalicylate (PEPTO BISMOL) 262 MG/15ML suspension Take 30 mLs by mouth every 6 (six) hours as needed for indigestion.   Yes [provider]  fexofenadine (ALLEGRA) 180 MG tablet Take 180 mg by mouth daily as needed for allergies or rhinitis.   Yes [provider]  fluticasone  (FLONASE ) 50 MCG/ACT nasal spray Place 1 spray into both nostrils daily as needed for allergies or rhinitis.   Yes [provider]  HYDROcodone -acetaminophen  (NORCO/VICODIN) 5-325 MG tablet Take 1-2 tablets by mouth every 6 (six) hours as needed for moderate pain (pain score 4-6).   Yes [provider]  KLOR-CON  M20 20 MEQ tablet TAKE 1 TABLET BY MOUTH EVERY DAY Patient taking differently: Take 10 mEq by mouth daily. 11/30/16  Yes Letha Truman ORN, NP  meloxicam (MOBIC) 15 MG tablet Take 15 mg by mouth daily as needed for pain. PLEASE SEE ATTACHED FOR DETAILED DIRECTIONS 09/13/21  Yes [provider]  mupirocin  ointment (BACTROBAN ) 2 % Apply 1 Application topically daily as needed. 01/13/23  Yes [provider]  ondansetron  (ZOFRAN ) 4 MG tablet Take 1 tablet (4 mg total) by mouth every 8 (eight) hours as needed for nausea or vomiting. 07/22/22  Yes Brown, Blaine K, PA-C  sennosides-docusate sodium   (SENOKOT-S) 8.6-50 MG tablet Take 2 tablets by mouth daily. 07/22/22  Yes Brown, Blaine K, PA-C  valsartan -hydrochlorothiazide  (DIOVAN -HCT) 320-12.5 MG tablet Take 1 tablet by mouth daily. 05/21/22  Yes [provider]  acetaminophen  (TYLENOL ) 500 MG tablet Take 1,000 mg by mouth every 8 (eight) hours as needed for moderate pain.    [provider]     Family History  Problem Relation Age of Onset   Stroke Mother    Cervical cancer Mother 70   Kidney cancer Father 66   Aneurysm Paternal Grandmother        70s   Hodgkin's lymphoma Maternal Uncle 33   Prostate cancer Paternal Uncle    Throat cancer Paternal Uncle    Cancer Cousin        2 female maternal first cousins with unknown cancer   Colon cancer Neg Hx    Stomach cancer Neg Hx     Social History   Socioeconomic History   Marital status: Married    Spouse name: Not on  file   Number of children: Not on file   Years of education: Not on file   Highest education level: Not on file  Occupational History   Not on file  Tobacco Use   Smoking status: Never   Smokeless tobacco: Never  Vaping Use   Vaping status: Never Used  Substance and Sexual Activity   Alcohol use: No   Drug use: No   Sexual activity: Yes    Birth control/protection: Surgical    Comment: hyst  Other Topics Concern   Not on file  Social History Narrative   Not on file   Social Drivers of Health   Financial Resource Strain: Not on file  Food Insecurity: No Food Insecurity (02/10/2023)   Hunger Vital Sign    Worried About Running Out of Food in the Last Year: Never true    Ran Out of Food in the Last Year: Never true  Transportation Needs: No Transportation Needs (02/10/2023)   PRAPARE - Administrator, Civil Service (Medical): No    Lack of Transportation (Non-Medical): No  Physical Activity: Not on file  Stress: Not on file  Social Connections: Socially Integrated (02/10/2023)   Social Connection and Isolation Panel  [NHANES]    Frequency of Communication with Friends and Family: Once a week    Frequency of Social Gatherings with Friends and Family: More than three times a week    Attends Religious Services: 1 to 4 times per year    Active Member of [employer] or Organizations: No    Attends Engineer, Structural: More than 4 times per year    Marital Status: Married      Review of Systems: A 12 point ROS discussed and pertinent positives are indicated in the HPI above.  All other systems are negative.  Review of Systems  Constitutional:  Negative for fatigue and fever.  HENT:  Negative for congestion.   Respiratory:  Negative for cough and shortness of breath.   Gastrointestinal:  Negative for abdominal pain, diarrhea, nausea and vomiting.    Vital Signs: BP (!) 130/55   Pulse 80   Temp 98.7 F (37.1 C) (Oral)   Resp 17   Ht 5' (1.524 m)   Wt 162 lb 11.2 oz (73.8 kg)   SpO2 97%   BMI 31.78 kg/m   Advance Care Plan: The advanced care plan/surrogate decision maker was discussed at the time of visit and documented in the medical record.    Physical Exam Vitals and nursing note reviewed.  Constitutional:      Appearance: She is well-developed.  HENT:     Head: Normocephalic and atraumatic.     Mouth/Throat:     Mouth: Mucous membranes are dry.  Eyes:     Conjunctiva/sclera: Conjunctivae normal.  Cardiovascular:     Rate and Rhythm: Normal rate and regular rhythm.  Pulmonary:     Effort: Pulmonary effort is normal.  Musculoskeletal:        General: Normal range of motion.     Cervical back: Normal range of motion.  Skin:    General: Skin is warm.     Coloration: Skin is pale.  Neurological:     General: No focal deficit present.     Mental Status: She is alert and oriented to person, place, and time. Mental status is at baseline.  Psychiatric:        Mood and Affect: Mood normal.        Behavior: Behavior  normal.        Thought Content: Thought content normal.         Judgment: Judgment normal.     Imaging: CT ANGIO GI BLEED Addendum Date: 02/10/2023 ADDENDUM REPORT: 02/10/2023 16:14 ADDENDUM: Critical Value/emergent results were called by telephone at the time of interpretation on 02/10/2023 at 4:13 pm to provider ERIC AUSTRIA , who verbally acknowledged these results. Electronically Signed   By: Lynwood Landy Raddle M.D.   On: 02/10/2023 16:14   Result Date: 02/10/2023 CLINICAL DATA:  Upper gastrointestinal bleeding for 2 days. EXAM: CTA ABDOMEN AND PELVIS WITHOUT AND WITH CONTRAST TECHNIQUE: Multidetector CT imaging of the abdomen and pelvis was performed using the standard protocol during bolus administration of intravenous contrast. Multiplanar reconstructed images and MIPs were obtained and reviewed to evaluate the vascular anatomy. RADIATION DOSE REDUCTION: This exam was performed according to the departmental dose-optimization program which includes automated exposure control, adjustment of the mA and/or kV according to patient size and/or use of iterative reconstruction technique. CONTRAST:  OMNIPAQUE  IOHEXOL  350 MG/ML SOLN COMPARISON:  July 19, 2014. FINDINGS: VASCULAR Aorta: Normal caliber aorta without aneurysm, dissection, vasculitis or significant stenosis. Celiac: Patent without evidence of aneurysm, dissection, vasculitis or significant stenosis. SMA: Patent without evidence of aneurysm, dissection, vasculitis or significant stenosis. Renals: Both renal arteries are patent without evidence of aneurysm, dissection, vasculitis, fibromuscular dysplasia or significant stenosis. IMA: Patent without evidence of aneurysm, dissection, vasculitis or significant stenosis. Inflow: Patent without evidence of aneurysm, dissection, vasculitis or significant stenosis. Proximal Outflow: Bilateral common femoral and visualized portions of the superficial and profunda femoral arteries are patent without evidence of aneurysm, dissection, vasculitis or significant stenosis.  Veins: No obvious venous abnormality within the limitations of this arterial phase study. Review of the MIP images confirms the above findings. NON-VASCULAR Lower chest: No acute abnormality. Hepatobiliary: Multiple hepatic cysts are again noted. Status post cholecystectomy. No biliary dilatation is noted. Pancreas: Unremarkable. No pancreatic ductal dilatation or surrounding inflammatory changes. Spleen: Normal in size without focal abnormality. Adrenals/Urinary Tract: Adrenal glands are unremarkable. Kidneys are normal, without renal calculi, focal lesion, or hydronephrosis. Bladder is unremarkable. Stomach/Bowel: There is no evidence of bowel obstruction. There is no clear extravasation of contrast seen on this exam, although a small high density focus is seen anterior to the gastroduodenal branch of the celiac artery posterior to the stomach, best seen on image number 75 of series 6. This potentially may represent a small branch, but small focus of extravasation cannot be excluded, although it is not clearly visualized on more delayed imaging. Mild inflammatory changes are seen involving the distal stomach and proximal duodenum with mildly enlarged lymph nodes. The appendix is unremarkable. Lymphatic: No adenopathy is noted. Reproductive: Status post hysterectomy. No adnexal masses. Other: Findings consistent with prior ventral hernia repair in the pelvis. No ascites. Musculoskeletal: Status post right hip arthroplasty. No acute osseous abnormality seen. IMPRESSION: VASCULAR As noted above, there is a very small high density focus seen anterior to the gastroduodenal branch of the celiac artery which is posterior to the stomach. This may simply represent a very small branch of the artery, but small focus of extravasation cannot be excluded, although it is not clearly visualized on more delayed imaging of this area. These results will be called to the ordering clinician or representative by the Radiologist  Assistant, and communication documented in the PACS or zVision Dashboard. NON-VASCULAR Mild inflammatory changes are seen involving the distal stomach and proximal duodenum with mildly  enlarged lymph nodes, suggesting peptic ulcer disease. Electronically Signed: By: Lynwood Landy Raddle M.D. On: 02/10/2023 16:05    Labs:  CBC: Recent Labs    07/14/22 1332 07/22/22 0746 02/10/23 1100 02/10/23 1652 02/11/23 0334  WBC 7.7 7.9 9.4  --  8.1  HGB 12.5 10.7* 7.6* 6.1* 8.7*  HCT 40.5 33.2* 24.5* 19.6* 26.2*  PLT 228 193 314  --  284    COAGS: No results for input(s): INR, APTT in the last 8760 hours.  BMP: Recent Labs    07/14/22 1332 02/10/23 1100 02/11/23 0334  NA 137 136 136  K 4.0 5.3* 4.1  CL 104 104 106  CO2 23 21* 23  GLUCOSE 94 103* 90  BUN 27* 58* 48*  CALCIUM 9.2 8.5* 8.2*  CREATININE 1.16* 1.17* 0.87  GFRNONAA 50* 49* >60    LIVER FUNCTION TESTS: Recent Labs    02/10/23 1100  BILITOT 0.9  AST 22  ALT 10  ALKPHOS 39  PROT 6.1*  ALBUMIN  2.9*    Assessment and Plan:  73 y.o female inpatient History of PE ( not on anticoagulation)  ovarian cancer  s/p hysterectomy/oophorectomy,  HTN. Being worked up for psoriatic arthritis Presented to the ED at [facility] on 2.6.25 with melena and lighhedness X 2 days  and intermittent vomiting X 1-3 weeks.  CT Abd pelvis Me from 2..24 reads As noted above, there is a very small high density focus seen anterior to the gastroduodenal branch of the celiac artery which is posterior to the stomach. This may simply represent a very small branch of the artery, but small focus of extravasation cannot be excluded, although it is not clearly visualized on more delayed imaging of this area. Team is requesting an arteriogram with possible embolization. Case reviewed and approved by IR Attending Dr. Thom Hall.    PLAN: IR Image guided arteriogram with possible embolization.   The Risks and benefits of embolization were discussed with the  patient including, but not limited to bleeding, infection, vascular injury, post operative pain, or contrast induced renal failure.  This procedure involves the use of X-rays and because of the nature of the planned procedure, it is possible that we will have prolonged use of X-ray fluoroscopy.  Potential radiation risks to you include (but are not limited to) the following: - A slightly elevated risk for cancer several years later in life. This risk is typically less than 0.5% percent. This risk is low in comparison to the normal incidence of human cancer, which is 33% for women and 50% for men according to the American Cancer Society. - Radiation induced injury can include skin redness, resembling a rash, tissue breakdown / ulcers and hair loss (which can be temporary or permanent).   The likelihood of either of these occurring depends on the difficulty of the procedure and whether you are sensitive to radiation due to previous procedures, disease, or genetic conditions.   IF your procedure requires a prolonged use of radiation, you will be notified and given written instructions for further action.  It is your responsibility to monitor the irradiated area for the 2 weeks following the procedure and to notify your physician if you are concerned that you have suffered a radiation induced injury.    All of the patient's questions were answered, patient is agreeable to proceed. Consent signed and in chart.   Thank you for this interesting consult.  I greatly enjoyed meeting Sarah Walker and look forward to participating in  their care.  A copy of this report was sent to the requesting provider on this date.  Electronically Signed: Delon JAYSON Beagle, NP 02/11/2023, 8:12 AM   I spent a total of 40 Minutes    in face to face in clinical consultation, greater than 50% of which was counseling/coordinating care for arteriogram with possible embolization.

## 2023-02-11 NOTE — Progress Notes (Signed)
 Large bloody stool with lots of blood clots and chunks of blood

## 2023-02-11 NOTE — Progress Notes (Signed)
 Spoke to radiology this morning. They have plans to perform embolization of of the GDA given concerns for small amount of extravasation and ongoing drop in patients hemoglobin yesterday. Hgb improved from 6.1 to 8.7 after 2u PRBC yesterday afternoon.   They have reviewed the imaging further and have concerns about a possible contained perforation in the distal stomach and proximal duodenum therefore they have requested surgery to be aware and provide input if warranted.   I reached out to Dr. Kallie to make her aware of the patient and the plan. Formal consult not yet placed. Will determine timing of repeat EGD after embolization performed.   Charmaine Melia, MSN, APRN, FNP-BC, AGACNP-BC [facility] at [facility]

## 2023-02-11 NOTE — Sedation Documentation (Addendum)
 1000ml normal saline bolus started per Dr. Darylene Epley.

## 2023-02-11 NOTE — Progress Notes (Signed)
 RN requested to coordinate transportation to [facility] IR department via [facility]. Arrival time approximately 1330 today. RN verbalized understanding.

## 2023-02-12 ENCOUNTER — Inpatient Hospital Stay (HOSPITAL_COMMUNITY): Payer: Medicare Other

## 2023-02-12 DIAGNOSIS — K921 Melena: Secondary | ICD-10-CM | POA: Diagnosis not present

## 2023-02-12 DIAGNOSIS — R58 Hemorrhage, not elsewhere classified: Secondary | ICD-10-CM

## 2023-02-12 DIAGNOSIS — K269 Duodenal ulcer, unspecified as acute or chronic, without hemorrhage or perforation: Secondary | ICD-10-CM | POA: Diagnosis not present

## 2023-02-12 DIAGNOSIS — R710 Precipitous drop in hematocrit: Secondary | ICD-10-CM | POA: Diagnosis not present

## 2023-02-12 DIAGNOSIS — K922 Gastrointestinal hemorrhage, unspecified: Secondary | ICD-10-CM | POA: Diagnosis not present

## 2023-02-12 LAB — TYPE AND SCREEN
ABO/RH(D): A POS
Antibody Screen: NEGATIVE
Unit division: 0
Unit division: 0

## 2023-02-12 LAB — HEMOGLOBIN AND HEMATOCRIT, BLOOD
HCT: 25.8 % — ABNORMAL LOW (ref 36.0–46.0)
HCT: 23.9 % — ABNORMAL LOW (ref 36.0–46.0)
HCT: 31.2 % — ABNORMAL LOW (ref 36.0–46.0)
HCT: 29.9 % — ABNORMAL LOW (ref 36.0–46.0)
Hemoglobin: 8.6 g/dL — ABNORMAL LOW (ref 12.0–15.0)
Hemoglobin: 7.9 g/dL — ABNORMAL LOW (ref 12.0–15.0)
Hemoglobin: 10.8 g/dL — ABNORMAL LOW (ref 12.0–15.0)
Hemoglobin: 10.4 g/dL — ABNORMAL LOW (ref 12.0–15.0)

## 2023-02-12 LAB — BASIC METABOLIC PANEL
Anion gap: 9 (ref 5–15)
BUN: 44 mg/dL — ABNORMAL HIGH (ref 8–23)
CO2: 19 mmol/L — ABNORMAL LOW (ref 22–32)
Calcium: 7.7 mg/dL — ABNORMAL LOW (ref 8.9–10.3)
Chloride: 114 mmol/L — ABNORMAL HIGH (ref 98–111)
Creatinine, Ser: 0.87 mg/dL (ref 0.44–1.00)
GFR, Estimated: >60 mL/min (ref >60)
Glucose, Bld: 93 mg/dL (ref 70–99)
Potassium: 4.3 mmol/L (ref 3.5–5.1)
Sodium: 142 mmol/L (ref 135–145)

## 2023-02-12 LAB — BPAM RBC
Blood Product Expiration Date: 202503102359
ISSUE DATE / TIME: 202502071509
ISSUE DATE / TIME: 202502072208
ISSUE DATE / TIME: 202503102359
Unit Type and Rh: 202503102359
Unit Type and Rh: 202503102359
Unit Type and Rh: 5100
Unit Type and Rh: 5100

## 2023-02-12 LAB — LACTIC ACID, PLASMA
Lactic Acid, Venous: 1.2 mmol/L (ref 0.5–1.9)
Lactic Acid, Venous: 2.1 mmol/L (ref 0.5–1.9)
Lactic Acid, Venous: 1.4 mmol/L (ref 0.5–1.9)

## 2023-02-12 LAB — GLUCOSE, CAPILLARY
Glucose-Capillary: 70 mg/dL (ref 70–99)
Glucose-Capillary: 84 mg/dL (ref 70–99)
Glucose-Capillary: 131 mg/dL — ABNORMAL HIGH (ref 70–99)
Glucose-Capillary: 61 mg/dL — ABNORMAL LOW (ref 70–99)
Glucose-Capillary: 75 mg/dL (ref 70–99)

## 2023-02-12 LAB — PREPARE RBC (CROSSMATCH)

## 2023-02-12 MED ORDER — ORAL CARE MOUTH RINSE: 15.0000 mL | OROMUCOSAL | Status: DC | PRN

## 2023-02-12 MED ORDER — CHLORHEXIDINE GLUCONATE CLOTH 2 % EX PADS
6.0000 | MEDICATED_PAD | Freq: Every day | CUTANEOUS | Status: DC
  Administered 2023-02-13: 6 via TOPICAL

## 2023-02-12 MED ORDER — IOHEXOL 350 MG/ML SOLN
100.0000 mL | Freq: Once | INTRAVENOUS | Status: AC | PRN
  Administered 2023-02-12: 100 mL via INTRAVENOUS

## 2023-02-12 MED ORDER — SODIUM CHLORIDE 0.9 % IV BOLUS
1000.0000 mL | Freq: Once | INTRAVENOUS | Status: AC
  Administered 2023-02-12: 1000 mL via INTRAVENOUS

## 2023-02-12 MED ORDER — SUCRALFATE 1 GM/10ML PO SUSP
1.0000 g | Freq: Three times a day (TID) | ORAL | Status: DC
  Administered 2023-02-12 – 2023-02-14 (×8): 1 g via ORAL
  Filled 2023-02-12 (×8): qty 10

## 2023-02-12 MED ORDER — DEXTROSE 50 % IV SOLN
12.5000 g | INTRAVENOUS | Status: AC
  Administered 2023-02-12: 12.5 g via INTRAVENOUS

## 2023-02-12 MED ORDER — SODIUM CHLORIDE 0.9% IV SOLUTION: Freq: Once | INTRAVENOUS | Status: AC

## 2023-02-12 MED ORDER — DEXTROSE-SODIUM CHLORIDE 5-0.9 % IV SOLN: INTRAVENOUS | Status: AC

## 2023-02-12 MED ORDER — DEXTROSE 50 % IV SOLN
INTRAVENOUS | Status: AC
  Filled 2023-02-12: qty 50

## 2023-02-12 MED ORDER — ORAL CARE MOUTH RINSE
15.0000 mL | OROMUCOSAL | Status: DC
  Administered 2023-02-12: 15 mL via OROMUCOSAL

## 2023-02-12 NOTE — Progress Notes (Signed)
 Interventional Radiology Brief Note  Sarah Walker is a 73 year old female admitted to [facility] with upper GI bleed s/p empiric coil embolization of GDA by Dr. Hughes with return to [facility] ICU post-procedure.  Called to discuss with RN who notes procedure sites are stable.  Patient with decline in Hgb again overnight, now receiving 2u PRBCs.  Still having bloody bowel movements but remains hemodynamically stable, not on pressors.   IR remains available if needed.   Caroleann Casler, MS RD PA-C

## 2023-02-12 NOTE — Progress Notes (Signed)
 PROGRESS NOTE    Sarah Walker  FMW:[id number] DOB: 1950-09-06 DOA: 02/10/2023 PCP: Marvine Rush, MD   Brief Narrative:  HPI: Sarah Walker is a 73 y.o. female with medical history significant of HTN, anxiety, chronic knee pain, history of ovarian cancer s/p hysterectomy/oophorectomy and chemotherapy 2013 in remission, neuropathy, provoked pulmonary embolism 2013 in which she completed anticoagulation; and currently being worked up by rheumatology outpatient for concern of psoriatic arthritis who presented to [facility] ED on 2/6 via EMS with dark stools, epigastric abdominal pain.  Onset yesterday, thought that her stool was black due to taking hydrocodone  and a stool softener.  Pain localized in her epigastric region and reports some radiation towards her back.  Patient denies NSAID abuse, no BC or Goody powder.  Does utilize a baby aspirin  at baseline.  Also endorses generalized weakness, fatigue.  No longer on anticoagulation since treatment for pulmonary embolism in 2013.  Denies headache, no dizziness, no chest pain, no palpitations, no shortness of breath, no fever/chills/night sweats, no nausea/vomiting/diarrhea, no focal weakness, no paresthesias.     In the ED, temperature 98.0 F, HR 109, RR 18, BP 199/76, SpO2 99% on room air.  WBC 9.4, hemoglobin 7.6, platelet count 314, MCV 100.0.  Sodium 136, potassium 5.3, chloride 104, CO2 21, glucose 103, BUN 58, creatinine 1.17.  AST 22, ALT 10, total bilirubin 0.9.  FOBT positive.  GI was consulted.  Patient was given Protonix  40 mg IV x 1.  EDP consulted [facility] for admission for further evaluation and management of concern for upper GI bleed.  Assessment & Plan:   Principal Problem:   Acute upper GI bleed Active Problems:   Duodenal ulcer   Acute GI bleeding  Acute blood loss anemia secondary to upper GI bleed/duodenal ulcer, POA: Patient presented with dark tarry stools for 1 day.  On aspirin  81 mg p.o. daily outpatient.  No other  antiplatelets or anticoagulants.  Denied NSAID use including Goody or BC powder.  Hemoglobin 7.4 on admission, last 10.7 on 07/22/2022.  BUN elevated.  Consistent with upper GI bleed.  Hemoglobin dropped to 6.1 shortly after on 02/10/2023, patient received 2 units of PRBC transfusion.  Hemoglobin improved to 8.7.  Patient started on Protonix  IV twice daily.  GI consulted, patient underwent EGD, per GI, nonobstructing large duodenal ulcer with large EtheDent clot, unroofing was not attempted.  Perforation of the bowel wall could not be ruled out, injected, per GI, this is either a large peptic ulcer versus mass versus walled off perforation, GI recommended stat CT abdomen and pelvis with IV contrast and Protonix  was switched to drip.  Patient transferred to ICU.  CT angiogram GI bleed study with high density focus anterior to gastroduodenal branch celiac artery posterior to the stomach concerning for possible extravasation.  Previous hospitalist Dr. Austria discussed with interventional radiology, Dr. Jennefer who reviewed imaging.  Dr. Jennefer does not believe there is any concerning findings to suggest extravasation at this time.  Given patient is hemodynamically stable, without hemoptysis recommends conservative treatment at this time.  But does decompensate could consider prophylactic embolization of the GDA.   However IR reevaluated this patient on the morning of 02/11/2023 and planned for her transfer to [facility] to have procedure and she ended up having mesenteric arteriography and coil embolization of the gastroduodenal artery.  However patient became unstable with low blood pressure and large bloody bowel movement prior to this procedure over at [facility].  Regardless, she was  transferred back to [facility] after the procedure.  After arrival, patient's blood pressure was low.  She was given a fluid bolus.  She was assessed by general surgery, abdomen was benign, no indications or concern of bowel perforation per  general surgery.  2 units of FFP were ordered by general surgery and I had already ordered 2 units of blood transfusions as well.  Reportedly, overnight, according to patient, she has had at least 10 bloody bowel movements.  Her blood pressure also remains low.  Once again lengthy discussion with GI as well as general surgery and we had IR included in the secure chat as well.  With patient being that unstable, she would be better served at [facility] where multiple specialties as well as IR is readily available and patient may need another embolization.  After recommendations from GI, I have ordered stat CTA/GI bleed scan, 2 more units of blood transfusion, stat lactic acid and 1 L of fluid bolus.  GI and general surgery both recommended transfer to [facility].  I have placed the order and notified patient placement as well.  Pending transfer at the moment.  Monitor very closely in ICU.  Her recent hemoglobin is only 7.9.  This is despite of her receiving 4 units of PRBC transfusion since hemoglobin was 5 yesterday.  Vitamin B12 deficiency: Start supplement.  Hyperkalemia Resolved   Essential hypertension On valsartan -HCTZ 320-12.5 mg p.o. daily at baseline PTA.  BP initially soft on admission.  Holding antihypertensives.   Chronic knee pain -- Tylenol /oxycodone  as needed -- Avoid NSAIDs as above   History of ovarian cancer  s/p hysterectomy/oophorectomy and chemotherapy 2013 in remission,    Hx provoked pulmonary embolism 2013 No longer on anticoagulation   Concern for psoriatic arthritis Currently followed by rheumatology outpatient, seen in clinic 02/09/2023 with workup underway.  DVT prophylaxis: SCDs Start: 02/10/23 1419   Code Status: Full Code  Family Communication: none present at bedside.  Plan of care discussed with patient in length and he/she verbalized understanding and agreed with it.  Status is: Inpatient Remains inpatient appropriate because: Patient with active GI bleed,  needs transfer to [facility].   Estimated body mass index is 31.78 kg/m as calculated from the following:   Height as of this encounter: 5' (1.524 m).   Weight as of this encounter: 73.8 kg.  Pressure Injury 02/10/23 Buttocks Right;Upper Stage 3 -  Full thickness tissue loss. Subcutaneous fat may be visible but bone, tendon or muscle are NOT exposed. pink, red, white, open (Active)  02/10/23 1700  Location: Buttocks  Location Orientation: Right;Upper  Staging: Stage 3 -  Full thickness tissue loss. Subcutaneous fat may be visible but bone, tendon or muscle are NOT exposed.  Wound Description (Comments): pink, red, white, open  Present on Admission: Yes  Dressing Type Foam - Lift dressing to assess site every shift 02/11/23 2000     Pressure Injury 02/10/23 Buttocks Right;Mid Stage 3 -  Full thickness tissue loss. Subcutaneous fat may be visible but bone, tendon or muscle are NOT exposed. Pink, red, white, open (Active)  02/10/23 1700  Location: Buttocks  Location Orientation: Right;Mid  Staging: Stage 3 -  Full thickness tissue loss. Subcutaneous fat may be visible but bone, tendon or muscle are NOT exposed.  Wound Description (Comments): Pink, red, white, open  Present on Admission: Yes  Dressing Type Foam - Lift dressing to assess site every shift 02/11/23 2000     Pressure Injury 02/10/23 Buttocks  Right;Lower Stage 3 -  Full thickness tissue loss. Subcutaneous fat may be visible but bone, tendon or muscle are NOT exposed. Pink, red, white, open (Active)  02/10/23 1700  Location: Buttocks  Location Orientation: Right;Lower  Staging: Stage 3 -  Full thickness tissue loss. Subcutaneous fat may be visible but bone, tendon or muscle are NOT exposed.  Wound Description (Comments): Pink, red, white, open  Present on Admission: Yes  Dressing Type Foam - Lift dressing to assess site every shift 02/11/23 2000   Nutritional Assessment: Body mass index is 31.78 kg/m.SABRA Seen by  dietician.  I agree with the assessment and plan as outlined below: Nutrition Status:        . Skin Assessment: I have examined the patient's skin and I agree with the wound assessment as performed by the wound care RN as outlined below: Pressure Injury 02/10/23 Buttocks Right;Upper Stage 3 -  Full thickness tissue loss. Subcutaneous fat may be visible but bone, tendon or muscle are NOT exposed. pink, red, white, open (Active)  02/10/23 1700  Location: Buttocks  Location Orientation: Right;Upper  Staging: Stage 3 -  Full thickness tissue loss. Subcutaneous fat may be visible but bone, tendon or muscle are NOT exposed.  Wound Description (Comments): pink, red, white, open  Present on Admission: Yes  Dressing Type Foam - Lift dressing to assess site every shift 02/11/23 2000     Pressure Injury 02/10/23 Buttocks Right;Mid Stage 3 -  Full thickness tissue loss. Subcutaneous fat may be visible but bone, tendon or muscle are NOT exposed. Pink, red, white, open (Active)  02/10/23 1700  Location: Buttocks  Location Orientation: Right;Mid  Staging: Stage 3 -  Full thickness tissue loss. Subcutaneous fat may be visible but bone, tendon or muscle are NOT exposed.  Wound Description (Comments): Pink, red, white, open  Present on Admission: Yes  Dressing Type Foam - Lift dressing to assess site every shift 02/11/23 2000     Pressure Injury 02/10/23 Buttocks Right;Lower Stage 3 -  Full thickness tissue loss. Subcutaneous fat may be visible but bone, tendon or muscle are NOT exposed. Pink, red, white, open (Active)  02/10/23 1700  Location: Buttocks  Location Orientation: Right;Lower  Staging: Stage 3 -  Full thickness tissue loss. Subcutaneous fat may be visible but bone, tendon or muscle are NOT exposed.  Wound Description (Comments): Pink, red, white, open  Present on Admission: Yes  Dressing Type Foam - Lift dressing to assess site every shift 02/11/23 2000    Consultants:  GI and  IR  Procedures:  EGD  Antimicrobials:  Anti-infectives (From admission, onward)    None         Subjective: Patient seen and examined.  She is denying any complaint.  Clinically she appears well but she is very pale.  Denies any abdominal pain but endorses having 10 bloody bowel movements since return from [facility] yesterday.  Objective: Vitals:   02/12/23 0900 02/12/23 0915 02/12/23 0930 02/12/23 0945  BP: 104/82 (!) 102/48 (!) 96/29 (!) 102/41  Pulse: 77 71 73 92  Resp: 14 15 14 20   Temp:  98.1 F (36.7 C)    TempSrc:  Oral    SpO2: 99% 99% 98% 99%  Weight:      Height:        Intake/Output Summary (Last 24 hours) at 02/12/2023 1012 Last data filed at 02/12/2023 0909 Gross per 24 hour  Intake 1657.01 ml  Output --  Net 1657.01 ml  Filed Weights   02/10/23 0939 02/10/23 1246 02/11/23 0600  Weight: 89.8 kg 76.7 kg 73.8 kg    Examination:  General exam: Appears calm and comfortable but very pale Respiratory system: Clear to auscultation. Respiratory effort normal. Cardiovascular system: S1 & S2 heard, RRR. No JVD, murmurs, rubs, gallops or clicks. No pedal edema. Gastrointestinal system: Abdomen is nondistended, soft and nontender. No organomegaly or masses felt. Normal bowel sounds heard. Central nervous system: Alert and oriented. No focal neurological deficits. Extremities: Symmetric 5 x 5 power. Skin: No rashes, lesions or ulcers.  Psychiatry: Judgement and insight appear normal. Mood & affect appropriate.   Data Reviewed: I have personally reviewed following labs and imaging studies  CBC: Recent Labs  Lab 02/10/23 1100 02/10/23 1652 02/11/23 0334 02/11/23 1200 02/11/23 1412 02/11/23 1839 02/12/23 0212 02/12/23 0551  WBC 9.4  --  8.1  --  8.8  --   --   --   NEUTROABS 7.9*  --   --   --   --   --   --   --   HGB 7.6*   < > 8.7* 9.1* 5.0* 6.3* 8.6* 7.9*  HCT 24.5*   < > 26.2* 28.1* 15.7* 20.0* 25.8* 23.9*  MCV 100.0  --  88.8  --  91.3  --   --    --   PLT 314  --  284  --  264  --   --   --    < > = values in this interval not displayed.   Basic Metabolic Panel: Recent Labs  Lab 02/10/23 1100 02/11/23 0334 02/12/23 0212  NA 136 136 142  K 5.3* 4.1 4.3  CL 104 106 114*  CO2 21* 23 19*  GLUCOSE 103* 90 93  BUN 58* 48* 44*  CREATININE 1.17* 0.87 0.87  CALCIUM 8.5* 8.2* 7.7*   GFR: Estimated Creatinine Clearance: 50.9 mL/min (by C-G formula based on SCr of 0.87 mg/dL). Liver Function Tests: Recent Labs  Lab 02/10/23 1100  AST 22  ALT 10  ALKPHOS 39  BILITOT 0.9  PROT 6.1*  ALBUMIN  2.9*   No results for input(s): LIPASE, AMYLASE in the last 168 hours. No results for input(s): AMMONIA in the last 168 hours. Coagulation Profile: Recent Labs  Lab 02/11/23 0935 02/11/23 1839  INR 1.1 1.5*   Cardiac Enzymes: No results for input(s): CKTOTAL, CKMB, CKMBINDEX, TROPONINI in the last 168 hours. BNP (last 3 results) No results for input(s): PROBNP in the last 8760 hours. HbA1C: No results for input(s): HGBA1C in the last 72 hours. CBG: No results for input(s): GLUCAP in the last 168 hours. Lipid Profile: No results for input(s): CHOL, HDL, LDLCALC, TRIG, CHOLHDL, LDLDIRECT in the last 72 hours. Thyroid  Function Tests: No results for input(s): TSH, T4TOTAL, FREET4, T3FREE, THYROIDAB in the last 72 hours. Anemia Panel: Recent Labs    02/10/23 1100  VITAMINB12 142*  FOLATE 8.6  FERRITIN 74  TIBC 249*  IRON 86  RETICCTPCT 1.3   Sepsis Labs: Recent Labs  Lab 02/11/23 2134 02/12/23 0212  LATICACIDVEN 2.0* 1.2    Recent Results (from the past 240 hours)  MRSA Next Gen by PCR, Nasal     Status: None   Collection Time: 02/10/23  6:00 PM   Specimen: Nasal Mucosa; Nasal Swab  Result Value Ref Range Status   MRSA by PCR Next Gen NOT DETECTED NOT DETECTED Final    Comment: (NOTE) The GeneXpert MRSA Assay (FDA approved for NASAL specimens  only), is one component of  a comprehensive MRSA colonization surveillance program. It is not intended to diagnose MRSA infection nor to guide or monitor treatment for MRSA infections. Test performance is not FDA approved in patients less than 59 years old. Performed at [facility], [address], [place], [place] [address]      Radiology Studies: IR EMBO ART  VEN HEMORR LYMPH EXTRAV  INC GUIDE ROADMAPPING Result Date: 02/11/2023 INDICATION: Upper GI bleed.  Duodenal ulcer. EXAM: Procedures: 1. MESENTERIC ARTERIOGRAPHY, INCLUDING CELIAC, COMMON HEPATIC AND GASTRODUODENAL ARTERIOGRAMS 2. COIL EMBOLIZATION OF GASTRODUODENAL ARTERY COMPARISON:  CT [facility], 02/10/2023. MEDICATIONS: None ANESTHESIA/SEDATION: Local anesthetic was administered. The patient was continuously monitored during the procedure by the interventional radiology nurse under my direct supervision. CONTRAST:  90 mL Omnipaque  300 FLUOROSCOPY TIME:  Fluoroscopic dose; 304 mGy COMPLICATIONS: None immediate. PROCEDURE: Informed consent was obtained from the patient and/or patient's representative following explanation of the procedure, risks, benefits and alternatives. All questions were addressed. A time out was performed prior to the initiation of the procedure. Maximal barrier sterile technique utilized including caps, mask, sterile gowns, sterile gloves, large sterile drape, hand hygiene, and chlorhexidine  prep. The RIGHT femoral head was marked fluoroscopically. Under sterile conditions and local anesthesia, the RIGHT common femoral artery access was performed with a micropuncture needle. Under direct ultrasound guidance, the RIGHT common femoral was accessed with a micropuncture kit. An ultrasound image was saved for documentation purposes. This allowed for placement of a 5 Fr vascular sheath. A limited arteriogram was performed through the side arm of the sheath confirming appropriate access within the RIGHT common femoral artery. A limited abdominal aortogram was  performed to help identify the celiac artery origin. Over a Bentson wire, a recurve (Chung B) catheter was advanced, back bled and flushed. The catheter was then utilized to select the celiac artery and a selective celiac arteriogram was performed. Using a 2.4 Fr Progreat microcatheter and 0.016 inch Fathom microwire access into the gastroduodenal artery was performed and a selective arteriogram was performed. Selective embolization with multiple 0.018 inch Terumo micro coils was performed. A fluoropak packing coil was utilized to complete the embolization and was deployed, however the deployment mechanism was lodged within the common hepatic artery. An attempt at capture with a um loop snare was performed but unsuccessful. The microcatheter was therefore removed and arteriogram with the 5 Fr catheter at the common hepatic artery was performed. Adequate pruning of the GDA feeder arteries was achieved on post embolization arteriogram. Images were reviewed and the procedure was terminated. All wires, catheters and sheaths were removed from the patient. Hemostasis was achieved at the RIGHT groin access site with Angio-Seal closure device. The patient tolerated the procedure well without immediate post procedural complication. FINDINGS: *access via the RIGHT femoral artery. *Gastroduodenal arteriogram without angiographic evidence of active contrast extravasation. *Successful embolization with adequate pruning of the GDA distribution arteries. *Retained foreign body with the coil deployment string mechanism lodged within the [facility] *Angio-Seal closure at RIGHT groin. Palpable RLE pulses at the end of the Case IMPRESSION: 1. Successful coil embolization of the gastroduodenal artery for persistent upper GI bleed. 2. No angiographic evidence of active contrast extravasation. 3. Retained foreign body with the coil deployment string mechanism lodged within the [facility] PLAN: - The patient is to remain flat for 2 hours with RIGHT  leg straight. - The patient may continue to experience residual melena however should resolve in the coming days. Thom Hall, MD [facility]  [facility] Radiology Electronically Signed   By: Thom Hall M.D.   On: 02/11/2023 18:47   IR US  Guide Vasc Access Right Result Date: 02/11/2023 INDICATION: Upper GI bleed.  Duodenal ulcer. EXAM: Procedures: 1. MESENTERIC ARTERIOGRAPHY, INCLUDING CELIAC, COMMON HEPATIC AND GASTRODUODENAL ARTERIOGRAMS 2. COIL EMBOLIZATION OF GASTRODUODENAL ARTERY COMPARISON:  [facility], 02/10/2023. MEDICATIONS: None ANESTHESIA/SEDATION: Local anesthetic was administered. The patient was continuously monitored during the procedure by the interventional radiology nurse under my direct supervision. CONTRAST:  90 mL Omnipaque  300 FLUOROSCOPY TIME:  Fluoroscopic dose; 304 mGy COMPLICATIONS: None immediate. PROCEDURE: Informed consent was obtained from the patient and/or patient's representative following explanation of the procedure, risks, benefits and alternatives. All questions were addressed. A time out was performed prior to the initiation of the procedure. Maximal barrier sterile technique utilized including caps, mask, sterile gowns, sterile gloves, large sterile drape, hand hygiene, and chlorhexidine  prep. The RIGHT femoral head was marked fluoroscopically. Under sterile conditions and local anesthesia, the RIGHT common femoral artery access was performed with a micropuncture needle. Under direct ultrasound guidance, the RIGHT common femoral was accessed with a micropuncture kit. An ultrasound image was saved for documentation purposes. This allowed for placement of a 5 Fr vascular sheath. A limited arteriogram was performed through the side arm of the sheath confirming appropriate access within the RIGHT common femoral artery. A limited abdominal aortogram was performed to help identify the celiac artery origin. Over a Bentson wire, a recurve (Chung B)  catheter was advanced, back bled and flushed. The catheter was then utilized to select the celiac artery and a selective celiac arteriogram was performed. Using a 2.4 Fr Progreat microcatheter and 0.016 inch Fathom microwire access into the gastroduodenal artery was performed and a selective arteriogram was performed. Selective embolization with multiple 0.018 inch Terumo micro coils was performed. A fluoropak packing coil was utilized to complete the embolization and was deployed, however the deployment mechanism was lodged within the common hepatic artery. An attempt at capture with a um loop snare was performed but unsuccessful. The microcatheter was therefore removed and arteriogram with the 5 Fr catheter at the common hepatic artery was performed. Adequate pruning of the GDA feeder arteries was achieved on post embolization arteriogram. Images were reviewed and the procedure was terminated. All wires, catheters and sheaths were removed from the patient. Hemostasis was achieved at the RIGHT groin access site with Angio-Seal closure device. The patient tolerated the procedure well without immediate post procedural complication. FINDINGS: *access via the RIGHT femoral artery. *Gastroduodenal arteriogram without angiographic evidence of active contrast extravasation. *Successful embolization with adequate pruning of the GDA distribution arteries. *Retained foreign body with the coil deployment string mechanism lodged within the [facility] *Angio-Seal closure at RIGHT groin. Palpable RLE pulses at the end of the Case IMPRESSION: 1. Successful coil embolization of the gastroduodenal artery for persistent upper GI bleed. 2. No angiographic evidence of active contrast extravasation. 3. Retained foreign body with the coil deployment string mechanism lodged within the [facility]. PLAN: - The patient is to remain flat for 2 hours with RIGHT leg straight. - The patient may continue to experience residual melena however should resolve  in the coming days. Thom Hall, MD [facility] Radiology Electronically Signed   By: Thom Hall M.D.   On: 02/11/2023 18:47   IR Angiogram Visceral Selective Result Date: 02/11/2023 INDICATION: Upper GI bleed.  Duodenal ulcer. EXAM: Procedures: 1. MESENTERIC ARTERIOGRAPHY, INCLUDING CELIAC, COMMON HEPATIC AND GASTRODUODENAL ARTERIOGRAMS 2. COIL EMBOLIZATION OF  GASTRODUODENAL ARTERY COMPARISON:  CT [facility], 02/10/2023. MEDICATIONS: None ANESTHESIA/SEDATION: Local anesthetic was administered. The patient was continuously monitored during the procedure by the interventional radiology nurse under my direct supervision. CONTRAST:  90 mL Omnipaque  300 FLUOROSCOPY TIME:  Fluoroscopic dose; 304 mGy COMPLICATIONS: None immediate. PROCEDURE: Informed consent was obtained from the patient and/or patient's representative following explanation of the procedure, risks, benefits and alternatives. All questions were addressed. A time out was performed prior to the initiation of the procedure. Maximal barrier sterile technique utilized including caps, mask, sterile gowns, sterile gloves, large sterile drape, hand hygiene, and chlorhexidine  prep. The RIGHT femoral head was marked fluoroscopically. Under sterile conditions and local anesthesia, the RIGHT common femoral artery access was performed with a micropuncture needle. Under direct ultrasound guidance, the RIGHT common femoral was accessed with a micropuncture kit. An ultrasound image was saved for documentation purposes. This allowed for placement of a 5 Fr vascular sheath. A limited arteriogram was performed through the side arm of the sheath confirming appropriate access within the RIGHT common femoral artery. A limited abdominal aortogram was performed to help identify the celiac artery origin. Over a Bentson wire, a recurve (Chung B) catheter was advanced, back bled and flushed. The catheter was then utilized to select the  celiac artery and a selective celiac arteriogram was performed. Using a 2.4 Fr Progreat microcatheter and 0.016 inch Fathom microwire access into the gastroduodenal artery was performed and a selective arteriogram was performed. Selective embolization with multiple 0.018 inch Terumo micro coils was performed. A fluoropak packing coil was utilized to complete the embolization and was deployed, however the deployment mechanism was lodged within the common hepatic artery. An attempt at capture with a um loop snare was performed but unsuccessful. The microcatheter was therefore removed and arteriogram with the 5 Fr catheter at the common hepatic artery was performed. Adequate pruning of the GDA feeder arteries was achieved on post embolization arteriogram. Images were reviewed and the procedure was terminated. All wires, catheters and sheaths were removed from the patient. Hemostasis was achieved at the RIGHT groin access site with Angio-Seal closure device. The patient tolerated the procedure well without immediate post procedural complication. FINDINGS: *access via the RIGHT femoral artery. *Gastroduodenal arteriogram without angiographic evidence of active contrast extravasation. *Successful embolization with adequate pruning of the GDA distribution arteries. *Retained foreign body with the coil deployment string mechanism lodged within the [facility] *Angio-Seal closure at RIGHT groin. Palpable RLE pulses at the end of the Case IMPRESSION: 1. Successful coil embolization of the gastroduodenal artery for persistent upper GI bleed. 2. No angiographic evidence of active contrast extravasation. 3. Retained foreign body with the coil deployment string mechanism [facility]. PLAN: - The patient is to remain flat for 2 hours with RIGHT leg straight. - The patient may continue to experience residual melena however should resolve in the coming days. Thom Hall, MD [facility]  [facility] Radiology Electronically Signed   By: Thom Hall M.D.   On: 02/11/2023 18:47   IR Angiogram Selective Each Additional Vessel Result Date: 02/11/2023 INDICATION: Upper GI bleed.  Duodenal ulcer. EXAM: Procedures: 1. MESENTERIC ARTERIOGRAPHY, INCLUDING CELIAC, COMMON HEPATIC AND GASTRODUODENAL ARTERIOGRAMS 2. COIL EMBOLIZATION OF GASTRODUODENAL ARTERY COMPARISON:  CT [facility], 02/10/2023. MEDICATIONS: None ANESTHESIA/SEDATION: Local anesthetic was administered. The patient was continuously monitored during the procedure by the interventional radiology nurse under my direct supervision. CONTRAST:  90 mL Omnipaque  300 FLUOROSCOPY TIME:  Fluoroscopic dose; 304 mGy COMPLICATIONS: None immediate. PROCEDURE:  Informed consent was obtained from the patient and/or patient's representative following explanation of the procedure, risks, benefits and alternatives. All questions were addressed. A time out was performed prior to the initiation of the procedure. Maximal barrier sterile technique utilized including caps, mask, sterile gowns, sterile gloves, large sterile drape, hand hygiene, and chlorhexidine  prep. The RIGHT femoral head was marked fluoroscopically. Under sterile conditions and local anesthesia, the RIGHT common femoral artery access was performed with a micropuncture needle. Under direct ultrasound guidance, the RIGHT common femoral was accessed with a micropuncture kit. An ultrasound image was saved for documentation purposes. This allowed for placement of a 5 Fr vascular sheath. A limited arteriogram was performed through the side arm of the sheath confirming appropriate access within the RIGHT common femoral artery. A limited abdominal aortogram was performed to help identify the celiac artery origin. Over a Bentson wire, a recurve (Chung B) catheter was advanced, back bled and flushed. The catheter was then utilized to select the celiac artery and a selective celiac arteriogram was performed. Using a  2.4 Fr Progreat microcatheter and 0.016 inch Fathom microwire access into the gastroduodenal artery was performed and a selective arteriogram was performed. Selective embolization with multiple 0.018 inch Terumo micro coils was performed. A fluoropak packing coil was utilized to complete the embolization and was deployed, however the deployment mechanism was lodged within the common hepatic artery. An attempt at capture with a um loop snare was performed but unsuccessful. The microcatheter was therefore removed and arteriogram with the 5 Fr catheter at the common hepatic artery was performed. Adequate pruning of the GDA feeder arteries was achieved on post embolization arteriogram. Images were reviewed and the procedure was terminated. All wires, catheters and sheaths were removed from the patient. Hemostasis was achieved at the RIGHT groin access site with Angio-Seal closure device. The patient tolerated the procedure well without immediate post procedural complication. FINDINGS: *access via the RIGHT femoral artery. *Gastroduodenal arteriogram without angiographic evidence of active contrast extravasation. *Successful embolization with adequate pruning of the GDA distribution arteries. *Retained foreign body with the coil deployment string mechanism lodged within the [facility] *Angio-Seal closure at RIGHT groin. Palpable RLE pulses at the end of the Case IMPRESSION: 1. Successful coil embolization of the gastroduodenal artery for persistent upper GI bleed. 2. No angiographic evidence of active contrast extravasation. 3. Retained foreign body with the coil deployment string mechanism lodged within the [facility]. PLAN: - The patient is to remain flat for 2 hours with RIGHT leg straight. - The patient may continue to experience residual melena however should resolve in the coming days. Thom Hall, MD [facility] Radiology Electronically Signed   By: Thom Hall M.D.   On:  02/11/2023 18:47   CT ANGIO GI BLEED Addendum Date: 02/10/2023 ADDENDUM REPORT: 02/10/2023 16:14 ADDENDUM: Critical Value/emergent results were called by telephone at the time of interpretation on 02/10/2023 at 4:13 pm to provider ERIC AUSTRIA , who verbally acknowledged these results. Electronically Signed   By: Lynwood Landy Raddle M.D.   On: 02/10/2023 16:14   Result Date: 02/10/2023 CLINICAL DATA:  Upper gastrointestinal bleeding for 2 days. EXAM: CTA ABDOMEN AND PELVIS WITHOUT AND WITH CONTRAST TECHNIQUE: Multidetector CT imaging of the abdomen and pelvis was performed using the standard protocol during bolus administration of intravenous contrast. Multiplanar reconstructed images and MIPs were obtained and reviewed to evaluate the vascular anatomy. RADIATION DOSE REDUCTION: This exam was performed according to the departmental dose-optimization program which includes automated  exposure control, adjustment of the mA and/or kV according to patient size and/or use of iterative reconstruction technique. CONTRAST:  OMNIPAQUE  IOHEXOL  350 MG/ML SOLN COMPARISON:  July 19, 2014. FINDINGS: VASCULAR Aorta: Normal caliber aorta without aneurysm, dissection, vasculitis or significant stenosis. Celiac: Patent without evidence of aneurysm, dissection, vasculitis or significant stenosis. SMA: Patent without evidence of aneurysm, dissection, vasculitis or significant stenosis. Renals: Both renal arteries are patent without evidence of aneurysm, dissection, vasculitis, fibromuscular dysplasia or significant stenosis. IMA: Patent without evidence of aneurysm, dissection, vasculitis or significant stenosis. Inflow: Patent without evidence of aneurysm, dissection, vasculitis or significant stenosis. Proximal Outflow: Bilateral common femoral and visualized portions of the superficial and profunda femoral arteries are patent without evidence of aneurysm, dissection, vasculitis or significant stenosis. Veins: No obvious venous  abnormality within the limitations of this arterial phase study. Review of the MIP images confirms the above findings. NON-VASCULAR Lower chest: No acute abnormality. Hepatobiliary: Multiple hepatic cysts are again noted. Status post cholecystectomy. No biliary dilatation is noted. Pancreas: Unremarkable. No pancreatic ductal dilatation or surrounding inflammatory changes. Spleen: Normal in size without focal abnormality. Adrenals/Urinary Tract: Adrenal glands are unremarkable. Kidneys are normal, without renal calculi, focal lesion, or hydronephrosis. Bladder is unremarkable. Stomach/Bowel: There is no evidence of bowel obstruction. There is no clear extravasation of contrast seen on this exam, although a small high density focus is seen anterior to the gastroduodenal branch of the celiac artery posterior to the stomach, best seen on image number 75 of series 6. This potentially may represent a small branch, but small focus of extravasation cannot be excluded, although it is not clearly visualized on more delayed imaging. Mild inflammatory changes are seen involving the distal stomach and proximal duodenum with mildly enlarged lymph nodes. The appendix is unremarkable. Lymphatic: No adenopathy is noted. Reproductive: Status post hysterectomy. No adnexal masses. Other: Findings consistent with prior ventral hernia repair in the pelvis. No ascites. Musculoskeletal: Status post right hip arthroplasty. No acute osseous abnormality seen. IMPRESSION: VASCULAR As noted above, there is a very small high density focus seen anterior to the gastroduodenal branch of the celiac artery which is posterior to the stomach. This may simply represent a very small branch of the artery, but small focus of extravasation cannot be excluded, although it is not clearly visualized on more delayed imaging of this area. These results will be called to the ordering clinician or representative by the Radiologist Assistant, and communication  documented in the PACS or zVision Dashboard. NON-VASCULAR Mild inflammatory changes are seen involving the distal stomach and proximal duodenum with mildly enlarged lymph nodes, suggesting peptic ulcer disease. Electronically Signed: By: Lynwood Landy Raddle M.D. On: 02/10/2023 16:05    Scheduled Meds:  sodium chloride    Intravenous Once   ALPRAZolam   0.5 mg Oral QHS   Chlorhexidine  Gluconate Cloth  6 each Topical Q0600   leptospermum manuka honey  1 Application Topical Daily   pantoprazole  (PROTONIX ) IV  40 mg Intravenous Q12H   vitamin B-12  100 mcg Oral Daily   Continuous Infusions:   LOS: 2 days   Fredia Skeeter, MD [facility]  02/12/2023, 10:12 AM   *Please note that this is a verbal dictation therefore any spelling or grammatical errors are due to the Dragon Medical One system interpretation.  Please page via Amion and do not message via secure chat for urgent patient care matters. Secure chat can be used for non urgent patient care matters.  How to contact the [facility] Attending  or Consulting provider 7A - 7P or covering provider during after hours 7P -7A, for this patient?  Check the care team in [facility] and look for a) attending/consulting [facility] provider listed and b) the [facility] team listed. Page or secure chat 7A-7P. Log into [email or web address] and use Tobias's universal password to access. If you do not have the password, please contact the hospital operator. Locate the [facility] provider you are looking for under [facility] and page to a number that you can be directly reached. If you still have difficulty reaching the provider, please page the [facility] (Director on Call) for the Hospitalists listed on amion for assistance.

## 2023-02-12 NOTE — Progress Notes (Signed)
 [facility]  Talked to Dr. Vernon and Dr. Henreitta this AM. Continued dark bms and drifting h&h. Limited blood resources here. Cta recommended. Cta without active bleeding. Overall would continue resuscitation as major open surgery would be last option.  Manuelita Pander, MD

## 2023-02-12 NOTE — Progress Notes (Signed)
 Late note: Was notified by the nurse about patient's events at [facility], apparently, patient had very large bloody bowel movement when she arrived to [facility] to have the procedure by IR, her blood pressure dropped, rapid response was called, she received IV fluid bolus, per IR.  She was well enough to have the embolization completed and she was started on blood transfusion and she was sent back to the [facility]. With all that going on, it seemed more appropriate for the patient to stay back at [facility] show I called and spoke to the rapid response nurse and then spoke to the IR and found out that patient was already on the way back to [facility].  I called [facility] to try to reroute the truck to the [facility] but I was informed that since the patient does not have a designated bed, she could not go back.  Patient arrived back to [facility].  Long discussion with GI as well as general surgery Dr. Morna was involved in the care since GI was concerned about possible bowel perforation.  Upon arrival to ICU here at this campus, patient's blood pressure was slightly better, she clinically appeared slightly better but is still very pale.  General surgery assessed her, her abdomen exam was benign, there was no indication for surgery.  We decided to monitor here with blood transfusion and resuscitation for now but kept low threshold to transfer back to [facility] if needed. I spent approximately 50 minutes for the care of the patient communicating with multiple staff members and consultants.

## 2023-02-12 NOTE — Plan of Care (Signed)

## 2023-02-12 NOTE — Plan of Care (Signed)

## 2023-02-12 NOTE — Progress Notes (Signed)
 Extensive amount of bleeding noted and reported.  Color :Red/Maroon, Most recent amount 300. BP stable and however symptomatic with complaint of dizziness with turns. Remains NPO. Physicians ([facility]/admitting attending /Surgery) made aware.

## 2023-02-12 NOTE — Progress Notes (Signed)
 CT abdomen bleeding scan with no evidence of active bleeding at the time of the imaging.  Successful embolization of the gastroduodenal and proximal right gastroepiploic arteries.   BP (!) 103/37   Pulse 65   Temp 98.4 F (36.9 C) (Oral)   Resp 17   Ht 5' (1.524 m)   Wt 74.9 kg   SpO2 98%   BMI 32.25 kg/m   Plan to continue IV pantoprazole  Check H&H this pm.  Keep NPO for now.

## 2023-02-12 NOTE — Progress Notes (Signed)
 Toribio Fortune, M.D. Gastroenterology & Hepatology   Interval History:   Patient underwent GDA embolization yesterday at [facility].  Prior to undergoing this procedure she had large amount of hematochezia and was hypotensive, rapid response was called and she was given crystalloids.  Patient was transferred back to [facility], ICU.  While in the ICU, she presented soft blood pressures with recurrent episodes of hematochezia, close to 10 bowel movements.  Patient denies having any lightheadedness or dizziness.  No nausea or vomiting.  Repeat hemoglobin was 7.9 today in the morning.  Yesterday at 6 PM the hemoglobin was 6.3 and lactic acid was increased to 2.0.  Patient has required multiple blood products during the current hospitalization.  Inpatient Medications:  Current Facility-Administered Medications:    0.9 %  sodium chloride  infusion (Manually program via Guardrails IV Fluids), , Intravenous, Once, Vernon Ranks, MD   acetaminophen  (TYLENOL ) tablet 650 mg, 650 mg, Oral, Q6H PRN **OR** acetaminophen  (TYLENOL ) suppository 650 mg, 650 mg, Rectal, Q6H PRN, [place], Eric J, DO   albuterol  (PROVENTIL ) (2.5 MG/3ML) 0.083% nebulizer solution 2.5 mg, 2.5 mg, Nebulization, Q2H PRN, [place], Eric J, DO   ALPRAZolam  (XANAX ) tablet 0.5 mg, 0.5 mg, Oral, QHS, [place], Eric J, DO, 0.5 mg at 02/11/23 2148   Chlorhexidine  Gluconate Cloth 2 % PADS 6 each, 6 each, Topical, Q0600, [place], Eric J, DO, 6 each at 02/12/23 0321   fluticasone  (FLONASE ) 50 MCG/ACT nasal spray 1 spray, 1 spray, Each Nare, Daily PRN, [place], Eric J, DO   leptospermum manuka honey (MEDIHONEY) paste 1 Application, 1 Application, Topical, Daily, [place], Camellia PARAS, DO, 1 Application at 02/12/23 9155   ondansetron  (ZOFRAN ) tablet 4 mg, 4 mg, Oral, Q6H PRN **OR** ondansetron  (ZOFRAN ) injection 4 mg, 4 mg, Intravenous, Q6H PRN, [place], Eric J, DO   oxyCODONE  (Oxy IR/ROXICODONE ) immediate release tablet 5 mg, 5 mg, Oral, Q4H PRN,  [place], Eric J, DO   pantoprazole  (PROTONIX ) injection 40 mg, 40 mg, Intravenous, Q12H, [place], Eric J, DO, 40 mg at 02/12/23 0900   vitamin B-12 (CYANOCOBALAMIN ) tablet 100 mcg, 100 mcg, Oral, Daily, Pahwani, Ravi, MD, 100 mcg at 02/12/23 0900   zolpidem  (AMBIEN ) tablet 5 mg, 5 mg, Oral, QHS PRN, Vernon Ranks, MD, 5 mg at 02/11/23 2148  Facility-Administered Medications Ordered in Other Encounters:    sodium chloride  0.9 % injection 10 mL, 10 mL, Intravenous, PRN, Kefalas, Thomas S, PA-C, 10 mL at 08/31/12 1446   sodium chloride  0.9 % injection 10 mL, 10 mL, Intravenous, PRN, Lawernce Hacker, MD, 10 mL at 09/12/13 1408   I/O    Intake/Output Summary (Last 24 hours) at 02/12/2023 0909 Last data filed at 02/12/2023 0909 Gross per 24 hour  Intake 1657.01 ml  Output --  Net 1657.01 ml     Physical Exam: Temp:  [97.5 F (36.4 C)-98.5 F (36.9 C)] 97.5 F (36.4 C) (02/08 0845) Pulse Rate:  [68-112] 77 (02/08 0900) Resp:  [11-28] 14 (02/08 0900) BP: (67-133)/(31-82) 104/82 (02/08 0900) SpO2:  [88 %-100 %] 99 % (02/08 0900)  Temp (24hrs), Avg:98.1 F (36.7 C), Min:97.5 F (36.4 C), Max:98.5 F (36.9 C)  GENERAL: The patient is AO x3, in no acute distress. HEENT: Head is normocephalic and atraumatic. EOMI are intact. Mouth is well hydrated and without lesions. NECK: Supple. No masses LUNGS: Clear to auscultation. No presence of rhonchi/wheezing/rales. Adequate chest expansion HEART: RRR, normal s1 and s2. ABDOMEN: Soft, nontender, no guarding, no peritoneal signs, and nondistended. BS +. No masses.  EXTREMITIES: Without any cyanosis, clubbing, rash, lesions or edema. NEUROLOGIC: AOx3, no focal motor deficit. SKIN: no jaundice, no rashes  Laboratory Data: CBC:     Component Value Date/Time   WBC 8.8 02/11/2023 1412   RBC 1.72 (L) 02/11/2023 1412   HGB 7.9 (L) 02/12/2023 0551   HCT 23.9 (L) 02/12/2023 0551   PLT 264 02/11/2023 1412   MCV 91.3 02/11/2023 1412   MCH 29.1  02/11/2023 1412   MCHC 31.8 02/11/2023 1412   RDW 20.1 (H) 02/11/2023 1412   LYMPHSABS 1.0 02/10/2023 1100   MONOABS 0.4 02/10/2023 1100   EOSABS 0.0 02/10/2023 1100   BASOSABS 0.0 02/10/2023 1100   COAG:  Lab Results  Component Value Date   INR 1.5 (H) 02/11/2023   INR 1.1 02/11/2023   INR 1.0 12/13/2021    BMP:     Latest Ref Rng & Units 02/12/2023    2:12 AM 02/11/2023    3:34 AM 02/10/2023   11:00 AM  BMP  Glucose 70 - 99 mg/dL 93  90  896   BUN 8 - 23 mg/dL 44  48  58   Creatinine 0.44 - 1.00 mg/dL 9.12  9.12  8.82   Sodium 135 - 145 mmol/L 142  136  136   Potassium 3.5 - 5.1 mmol/L 4.3  4.1  5.3   Chloride 98 - 111 mmol/L 114  106  104   CO2 22 - 32 mmol/L 19  23  21    Calcium 8.9 - 10.3 mg/dL 7.7  8.2  8.5     HEPATIC:     Latest Ref Rng & Units 02/10/2023   11:00 AM 12/13/2021    4:02 AM 11/18/2020    1:26 PM  Hepatic Function  Total Protein 6.5 - 8.1 g/dL 6.1  6.0  6.3   Albumin  3.5 - 5.0 g/dL 2.9  2.6  3.6   AST 15 - 41 U/L 22  18  16    ALT 0 - 44 U/L 10  12  14    Alk Phosphatase 38 - 126 U/L 39  37  56   Total Bilirubin 0.0 - 1.2 mg/dL 0.9  0.4  0.7     CARDIAC: No results found for: CKTOTAL, CKMB, CKMBINDEX, TROPONINI    Imaging: I personally reviewed and interpreted the available labs, imaging and endoscopic files.   Assessment/Plan: Briefly, this is a 73 year old female with history of ovarian cancer status post complete hysterectomy and chemo, psoriasis, hypertension, who came to the hospital after presenting recurrent episodes of melena in the setting of meloxicam use.  Patient was found to have significant drop in her hemoglobin down to 7.6.  Underwent EGD on 2/6/2025Which showed presence of large amount of clots in the stomach, with presence of a nonobstructing cratered duodenal ulcer with adherent clot the duodenal bulb measuring, ulcer measured close to 20 mm.  This was injected with epinephrine .  CT angio bleed performed on the same day showed  presence of small less than esophagogastroduodenospy focus seen anterior to the gastroduodenal branch of the celiac artery, along with inflammatory changes in the distal stomach and proximal duodenum.  Due to this, the patient underwent cold embolization of the gastroduodenal artery yesterday by Dr. Hughes.  Unfortunately, after embolization was performed, the patient had recurrent episodes of hematochezia with further drop in her hemoglobin and soft blood pressures.  Case has been discussed with general surgery and hospitalist, along with interventional radiologist on-call (Dr. Karalee), decision was made to transfer  patient back to [facility] for close monitoring given high risk of bleeding and need for ICU level care and large amount of blood transfusion.  An urgent CTA was performed today which did not show any active bleeding at the moment.  - Repeat CBC q6h, transfuse if Hb <7 - Pantoprazole  40 mg twice daily -Sucralfate  1 g every 6 hours - 2 large bore IV lines - Active T/S - Keep NPO - Avoid NSAIDs -Agree with transfer to [facility] for close monitoring and possible need for intervention if recurrent bleeding  Toribio Fortune, MD Gastroenterology and Hepatology [facility] Gastroenterology

## 2023-02-12 NOTE — Progress Notes (Signed)
 [facility]  Came by to check on patient but she is already gone. RN reported benign abdominal exam and stable vitals. She is now being transported down to [facility]. I do not think she will require any surgical intervention for this and hopefully the IR coiling is sufficient. CTA was reviewed and reassuring. I have given [facility], Dr. Dasie, an FYI on EPIC but do not think they need to be involved unless a change in patient's exam.  Manuelita Pander, MD [facility] [address] [address] [place], [place] [address] [phone] (office)

## 2023-02-13 DIAGNOSIS — R58 Hemorrhage, not elsewhere classified: Secondary | ICD-10-CM

## 2023-02-13 DIAGNOSIS — K269 Duodenal ulcer, unspecified as acute or chronic, without hemorrhage or perforation: Secondary | ICD-10-CM | POA: Diagnosis not present

## 2023-02-13 LAB — BPAM RBC
Blood Product Expiration Date: 202502232359
Blood Product Expiration Date: 202502232359
Blood Product Expiration Date: 202502162359
Blood Product Expiration Date: 202502222359
Blood Product Expiration Date: 202502222359
Blood Product Expiration Date: 202502232359
ISSUE DATE / TIME: 202502080852
ISSUE DATE / TIME: 202502081047
ISSUE DATE / TIME: 202502061848
ISSUE DATE / TIME: 202502062207
ISSUE DATE / TIME: 202502071936
ISSUE DATE / TIME: 202502072156
Unit Type and Rh: 6200
Unit Type and Rh: 6200
Unit Type and Rh: 6200
Unit Type and Rh: 6200
Unit Type and Rh: 6200
Unit Type and Rh: 6200

## 2023-02-13 LAB — TYPE AND SCREEN
ABO/RH(D): A POS
Antibody Screen: NEGATIVE
Unit division: 0
Unit division: 0
Unit division: 0
Unit division: 0
Unit division: 0
Unit division: 0

## 2023-02-13 LAB — BASIC METABOLIC PANEL
Anion gap: 10 (ref 5–15)
BUN: 31 mg/dL — ABNORMAL HIGH (ref 8–23)
CO2: 19 mmol/L — ABNORMAL LOW (ref 22–32)
Calcium: 8.1 mg/dL — ABNORMAL LOW (ref 8.9–10.3)
Chloride: 113 mmol/L — ABNORMAL HIGH (ref 98–111)
Creatinine, Ser: 0.93 mg/dL (ref 0.44–1.00)
GFR, Estimated: >60 mL/min (ref >60)
Glucose, Bld: 94 mg/dL (ref 70–99)
Potassium: 3.4 mmol/L — ABNORMAL LOW (ref 3.5–5.1)
Sodium: 142 mmol/L (ref 135–145)

## 2023-02-13 LAB — HEPATIC FUNCTION PANEL
ALT: 14 U/L (ref 0–44)
AST: 17 U/L (ref 15–41)
Albumin: 2.8 g/dL — ABNORMAL LOW (ref 3.5–5.0)
Alkaline Phosphatase: 30 U/L — ABNORMAL LOW (ref 38–126)
Bilirubin, Direct: 0.2 mg/dL (ref 0.0–0.2)
Indirect Bilirubin: 0.5 mg/dL (ref 0.3–0.9)
Total Bilirubin: 0.7 mg/dL (ref 0.0–1.2)
Total Protein: 4.8 g/dL — ABNORMAL LOW (ref 6.5–8.1)

## 2023-02-13 LAB — CBC WITH DIFFERENTIAL/PLATELET
Abs Immature Granulocytes: 0.05 10*3/uL (ref 0.00–0.07)
Abs Immature Granulocytes: 0.07 10*3/uL (ref 0.00–0.07)
Basophils Absolute: 0 10*3/uL (ref 0.0–0.1)
Basophils Absolute: 0 10*3/uL (ref 0.0–0.1)
Basophils Relative: 0 %
Basophils Relative: 0 %
Eosinophils Absolute: 0.2 10*3/uL (ref 0.0–0.5)
Eosinophils Absolute: 0.3 10*3/uL (ref 0.0–0.5)
Eosinophils Relative: 3 %
Eosinophils Relative: 3 %
HCT: 27.9 % — ABNORMAL LOW (ref 36.0–46.0)
HCT: 30.9 % — ABNORMAL LOW (ref 36.0–46.0)
Hemoglobin: 9.5 g/dL — ABNORMAL LOW (ref 12.0–15.0)
Hemoglobin: 10.1 g/dL — ABNORMAL LOW (ref 12.0–15.0)
Immature Granulocytes: 1 %
Immature Granulocytes: 1 %
Lymphocytes Relative: 30 %
Lymphocytes Relative: 20 %
Lymphs Abs: 2.2 10*3/uL (ref 0.7–4.0)
Lymphs Abs: 1.8 10*3/uL (ref 0.7–4.0)
MCH: 30 pg (ref 26.0–34.0)
MCH: 29.2 pg (ref 26.0–34.0)
MCHC: 34.1 g/dL (ref 30.0–36.0)
MCHC: 32.7 g/dL (ref 30.0–36.0)
MCV: 88 fL (ref 80.0–100.0)
MCV: 89.3 fL (ref 80.0–100.0)
Monocytes Absolute: 0.7 10*3/uL (ref 0.1–1.0)
Monocytes Absolute: 1 10*3/uL (ref 0.1–1.0)
Monocytes Relative: 10 %
Monocytes Relative: 11 %
Neutro Abs: 4.1 10*3/uL (ref 1.7–7.7)
Neutro Abs: 5.9 10*3/uL (ref 1.7–7.7)
Neutrophils Relative %: 56 %
Neutrophils Relative %: 65 %
Platelets: 180 10*3/uL (ref 150–400)
Platelets: 185 10*3/uL (ref 150–400)
RBC: 3.17 MIL/uL — ABNORMAL LOW (ref 3.87–5.11)
RBC: 3.46 MIL/uL — ABNORMAL LOW (ref 3.87–5.11)
RDW: 16.6 % — ABNORMAL HIGH (ref 11.5–15.5)
RDW: 17 % — ABNORMAL HIGH (ref 11.5–15.5)
WBC: 7.3 10*3/uL (ref 4.0–10.5)
WBC: 9.1 10*3/uL (ref 4.0–10.5)
nRBC: 0 % (ref 0.0–0.2)
nRBC: 0 % (ref 0.0–0.2)

## 2023-02-13 LAB — MAGNESIUM: Magnesium: 1.7 mg/dL (ref 1.7–2.4)

## 2023-02-13 LAB — GLUCOSE, CAPILLARY
Glucose-Capillary: 92 mg/dL (ref 70–99)
Glucose-Capillary: 87 mg/dL (ref 70–99)
Glucose-Capillary: 78 mg/dL (ref 70–99)

## 2023-02-13 LAB — PHOSPHORUS: Phosphorus: 2.4 mg/dL — ABNORMAL LOW (ref 2.5–4.6)

## 2023-02-13 MED ORDER — K PHOS MONO-SOD PHOS DI & MONO 155-852-130 MG PO TABS
500.0000 mg | ORAL_TABLET | Freq: Once | ORAL | Status: AC
  Administered 2023-02-13: 500 mg via ORAL
  Filled 2023-02-13: qty 2

## 2023-02-13 MED ORDER — GERHARDT'S BUTT CREAM
1.0000 | TOPICAL_CREAM | Freq: Four times a day (QID) | CUTANEOUS | Status: DC | PRN
  Filled 2023-02-13: qty 60

## 2023-02-13 MED ORDER — POTASSIUM CHLORIDE CRYS ER 20 MEQ PO TBCR
40.0000 meq | EXTENDED_RELEASE_TABLET | ORAL | Status: AC
Start: 2023-02-13 — End: 2023-02-13
  Administered 2023-02-13 (×2): 40 meq via ORAL
  Filled 2023-02-13 (×2): qty 2

## 2023-02-13 NOTE — Progress Notes (Signed)
 PROGRESS NOTE    Sarah Walker  FMW:[id number] DOB: 12/14/1950 DOA: 02/10/2023 PCP: Marvine Rush, MD   Brief Narrative:  The patient is a 73 year old Caucasian female with past medical history significant for but also anxiety, hypertension, chronic knee pain, history of ovarian cancer status post hysterectomy/nephrectomy and chemotherapy in 2013 remission, history of neuropathy, history of provoked PE and other comorbidities including stress concern for psoriatic arthritis who presented to the [facility] ED on 02/10/2023 via EMS secondary to dark stools epigastric abdominal pain.  The onset was the day prior to admission her stool was black and the patient thought it was due to her taking hydrocodone  as 2000.  She had some pain in her epigastric region and reported radiation towards her back and denied NSAID use, BC powder or Goody powder.  She does utilize a baby aspirin  at baseline and endorses generalized fatigue and weakness.  She was FOBT positive and GI was consulted and [facility] was asked admit this patient for evaluation concern for upper GI bleed.  Hemoglobin on admission was extremely low and she received 2 units of PRBCs and she improved 8.7.  She was started on IV PPI and GI to the patient for EGD and it showed a large nonobstructing duodenal ulcer a large evident clot and unroofing was not attempted.  Given that a large peptic ulcer versus a mass versus a walled off perforation could not be determined and ruled out GI recommended stat CT scan abdomen pelvis recommending IV Protonix  drip and so she is transferred to the ICU.  CTA GI bleed showed a high density focus anterior to the gastroduodenal branch celiac artery posterior to the stomach concerning for possible extravasation and previous hospitalist discussed with interventional radiology Dr. Jennefer who did not believe that there is any concern findings to suggest extra sedation.  She is hemodynamic stable without hemoptysis and conservative  treatment recommended at that time however on the morning of 02/11/2023 plan was to transfer the patient to [facility] to have an IR procedure and she ended up having a mesenteric arteriography embolization of the gastroduodenal artery.  She was transferred back from [facility] and if pain after the procedure after arrival the patient's blood pressure was low she is given fluid bolus.  General surgery was consulted and is given 2 units of FFP and another 2 units of blood.  GI was consulted and patient was transferred back to [facility] and another repeat CT angio bleeding scan which showed no evidence of active bleeding.  Currently her hemoglobin is now stable and diet has been advanced.  No further bleeding noted.  Will obtain PT OT to further evaluate and treat and monitor overnight.  Hemoglobin continues to improve  Assessment and Plan:  Acute blood loss anemia secondary to upper GI bleed/duodenal ulcer, POA -See above for clinical course -She is status post mesenteric arteriography embolization of the gastroduodenal artery is also status post an EGD by GI -Had to be transferred back from [facility] yesterday and repeat CTA done and showed VASCULAR: 1. No evidence of active bleeding at the time of imaging. 2. Successful coil embolization of the gastroduodenal and proximal right gastroepiploic arteries. 3. Nonocclusive helical material present within the proximal splenic artery, throughout the common hepatic artery and within the proximal proper hepatic artery. 4. Scattered atherosclerotic plaque. Aortic Atherosclerosis. NON-VASCULAR: 1. No acute abnormality within the abdomen or pelvis. 2. Ancillary findings as above without interval change compared to recent prior imaging. -Anemia panel was  done and showed an iron level 86, UIBC 163, TIBC 249, saturation ratio 35%, ferritin of 74 and folate level of 8.6 and vitamin B12 level 142 -Current Hgb/Hct Trend: Recent Labs  Lab 02/11/23 1200  02/11/23 1412 02/11/23 1839 02/12/23 0212 02/12/23 0551 02/13/23 0240 02/13/23 1527  HGB 9.1* 5.0* 6.3* 8.6* 7.9* 9.5* 10.1*  HCT 28.1* 15.7* 20.0* 25.8* 23.9* 27.9* 30.9*  MCV  --  91.3  --   --   --  88.0 89.3  -Patient is status post 7 units of blood transfused and 1 unit of FFP -Now is back on IV PPI 40 mg BID and continuing sucralfate  1 g p.o. 3 times daily with meals and bedtime -Has no further active signs or symptoms of bleeding and will get PT OT to further evaluate and treat   Vitamin B12 deficiency -Vitamin B12 level was 142 -Initiate supplementation with cyanocobalamin  100 mcg p.o. daily   Hypokalemia -Patient's K+ Level Trend: Recent Labs  Lab 02/10/23 1100 02/11/23 0334 02/12/23 0212 02/13/23 0240  K 5.3* 4.1 4.3 3.4*  -Replete with po KCL 40 mEQ q4h x2 and po K Phos  Neutral 500 mg x1 -Continue to Monitor and Replete as Necessary -Repeat CMP in the AM   Metabolic Acidosis -Has a CO2 of 19, chloride level of 113 and anion gap of 10 -Continue to monitor and trend and repeat CMP in a.m.  Hypophosphatemia -Phos Level Trend: Recent Labs  Lab 02/13/23 0240  PHOS 2.4*  -Replete with po K Phos  Neutral 500 mg po x1 -Continue to Monitor and Trend and Repeat Phos Level in the AM   Essential Hypertension -On valsartan -HCTZ 320-12.5 mg p.o. daily at baseline PTA.   -BP initially soft on admission.   -Continue Holding antihypertensives. -Continue to monitor blood pressures per protocol -Last blood pressure reading was 122/59   Chronic Knee Pain -Avoid all NSAIDs -Continue with p.o./RC 650 mg of acetaminophen  every 6 as needed for mild pain and continue with p.o. oxycodone  5 mg every 4 as needed for moderate pain   History of ovarian cancer  -S/p hysterectomy/oophorectomy and chemotherapy 2013 in remission -Will need further evaluation by oncology in outpatient setting   Hx provoked pulmonary embolism 2013 -No longer on anticoagulation   Concern for  psoriatic arthritis -Currently followed by rheumatology outpatient, seen in clinic 02/09/2023 with workup underway.  Hypoalbuminemia -Patient's Albumin  Trend: Recent Labs  Lab 02/10/23 1100 02/13/23 0240  ALBUMIN  2.9* 2.8*  -Continue to Monitor and Trend and repeat CMP in the AM  Class I Obesity -Complicates overall prognosis and care -Estimated body mass index is 32.25 kg/m as calculated from the following:   Height as of this encounter: 5' (1.524 m).   Weight as of this encounter: 74.9 kg.  -Weight Loss and Dietary Counseling given   DVT prophylaxis: SCDs Start: 02/10/23 1419    Code Status: Full Code Family Communication: No family currently at bedside  Disposition Plan:  Level of care: Progressive Status is: Inpatient Remains inpatient appropriate because: Needs further evaluation by PT and OT to determine safe discharge disposition   Consultants:  General surgery Gastroenterology Interventional radiology  Procedures:  As delineated as above  Antimicrobials:  Anti-infectives (From admission, onward)    None       Subjective: Seen and examined at bedside was doing little bit better.  Had no real further bleeding noted but did have a slightly dark stool which was thought to be old blood.  Asking for diet as her  diet has been advanced given her hemoglobin is stable.  No nausea or vomiting.  No other concerns or complaints at this time.  Objective: Vitals:   02/13/23 0752 02/13/23 1127 02/13/23 1530 02/13/23 2003  BP:    (!) 122/59  Pulse:    71  Resp:    19  Temp: 98.4 F (36.9 C) 98.3 F (36.8 C) 98.4 F (36.9 C) 99.8 F (37.7 C)  TempSrc: Oral Oral Oral Oral  SpO2:    99%  Weight:      Height:        Intake/Output Summary (Last 24 hours) at 02/13/2023 2034 Last data filed at 02/13/2023 2000 Gross per 24 hour  Intake 2075.28 ml  Output --  Net 2075.28 ml   Filed Weights   02/10/23 1246 02/11/23 0600 02/12/23 1238  Weight: 76.7 kg 73.8 kg 74.9 kg    Examination: Physical Exam:  Constitutional: WN/WD obese Caucasian female who appears calm Respiratory: Diminished to auscultation bilaterally, no wheezing, rales, rhonchi or crackles. Normal respiratory effort and patient is not tachypenic. No accessory muscle use.  Unlabored breathing Cardiovascular: RRR, no murmurs / rubs / gallops. S1 and S2 auscultated. No extremity edema. .  Abdomen: Soft, non-tender, distended secondary to body habitus. Bowel sounds positive.  GU: Deferred. Musculoskeletal: No clubbing / cyanosis of digits/nails. No joint deformity upper and lower extremities.  Skin: No rashes, lesions, ulcers on limited skin evaluation. No induration; Warm and dry.  Neurologic: CN 2-12 grossly intact with no focal deficits. Romberg sign and cerebellar reflexes not assessed.  Psychiatric: Normal judgment and insight. Alert and oriented x 3. Normal mood and appropriate affect.   Data Reviewed: I have personally reviewed following labs and imaging studies  CBC: Recent Labs  Lab 02/10/23 1100 02/10/23 1652 02/11/23 0334 02/11/23 1200 02/11/23 1412 02/11/23 1839 02/12/23 0212 02/12/23 0551 02/13/23 0240 02/13/23 1527  WBC 9.4  --  8.1  --  8.8  --   --   --  7.3 9.1  NEUTROABS 7.9*  --   --   --   --   --   --   --  4.1 5.9  HGB 7.6*   < > 8.7*   < > 5.0* 6.3* 8.6* 7.9* 9.5* 10.1*  HCT 24.5*   < > 26.2*   < > 15.7* 20.0* 25.8* 23.9* 27.9* 30.9*  MCV 100.0  --  88.8  --  91.3  --   --   --  88.0 89.3  PLT 314  --  284  --  264  --   --   --  180 185   < > = values in this interval not displayed.   Basic Metabolic Panel: Recent Labs  Lab 02/10/23 1100 02/11/23 0334 02/12/23 0212 02/13/23 0240  NA 136 136 142 142  K 5.3* 4.1 4.3 3.4*  CL 104 106 114* 113*  CO2 21* 23 19* 19*  GLUCOSE 103* 90 93 94  BUN 58* 48* 44* 31*  CREATININE 1.17* 0.87 0.87 0.93  CALCIUM 8.5* 8.2* 7.7* 8.1*  MG  --   --   --  1.7  PHOS  --   --   --  2.4*   GFR: Estimated Creatinine  Clearance: 48 mL/min (by C-G formula based on SCr of 0.93 mg/dL). Liver Function Tests: Recent Labs  Lab 02/10/23 1100 02/13/23 0240  AST 22 17  ALT 10 14  ALKPHOS 39 30*  BILITOT 0.9 0.7  PROT 6.1* 4.8*  ALBUMIN  2.9* 2.8*   No results for input(s): LIPASE, AMYLASE in the last 168 hours. No results for input(s): AMMONIA in the last 168 hours. Coagulation Profile: Recent Labs  Lab 02/11/23 0935 02/11/23 1839  INR 1.1 1.5*   Cardiac Enzymes: No results for input(s): CKTOTAL, CKMB, CKMBINDEX, TROPONINI in the last 168 hours. BNP (last 3 results) No results for input(s): PROBNP in the last 8760 hours. HbA1C: No results for input(s): HGBA1C in the last 72 hours. CBG: Recent Labs  Lab 02/12/23 1602 02/12/23 2039 02/13/23 0746 02/13/23 1125 02/13/23 1529  GLUCAP 131* 75 92 87 78   Lipid Profile: No results for input(s): CHOL, HDL, LDLCALC, TRIG, CHOLHDL, LDLDIRECT in the last 72 hours. Thyroid  Function Tests: No results for input(s): TSH, T4TOTAL, FREET4, T3FREE, THYROIDAB in the last 72 hours. Anemia Panel: No results for input(s): VITAMINB12, FOLATE, FERRITIN, TIBC, IRON, RETICCTPCT in the last 72 hours. Sepsis Labs: Recent Labs  Lab 02/11/23 2134 02/12/23 0212 02/12/23 1832  LATICACIDVEN 2.0* 1.2 1.4   Recent Results (from the past 240 hours)  MRSA Next Gen by PCR, Nasal     Status: None   Collection Time: 02/10/23  6:00 PM   Specimen: Nasal Mucosa; Nasal Swab  Result Value Ref Range Status   MRSA by PCR Next Gen NOT DETECTED NOT DETECTED Final    Comment: (NOTE) The GeneXpert MRSA Assay (FDA approved for NASAL specimens only), is one component of a comprehensive MRSA colonization surveillance program. It is not intended to diagnose MRSA infection nor to guide or monitor treatment for MRSA infections. Test performance is not FDA approved in patients less than 47 years old. Performed at [facility],  [address], [place], [place] [address]     Radiology Studies: CT ANGIO GI BLEED Result Date: 02/12/2023 CLINICAL DATA:  73 year old female with persistent hematochezia EXAM: CTA ABDOMEN AND PELVIS WITHOUT AND WITH CONTRAST TECHNIQUE: Multidetector CT imaging of the abdomen and pelvis was performed using the standard protocol during bolus administration of intravenous contrast. Multiplanar reconstructed images and MIPs were obtained and reviewed to evaluate the vascular anatomy. RADIATION DOSE REDUCTION: This exam was performed according to the departmental dose-optimization program which includes automated exposure control, adjustment of the mA and/or kV according to patient size and/or use of iterative reconstruction technique. CONTRAST:  OMNIPAQUE  IOHEXOL  350 MG/ML SOLN COMPARISON:  CTA abdomen and pelvis 02/10/2023 FINDINGS: VASCULAR Aorta: Tortuous descending thoracic aorta. Scattered atherosclerotic plaque. No aneurysm or dissection. Celiac: The celiac axis, left gastric and splenic arteries are all normal in appearance. Nonocclusive helical material is present extending from the origin of the splenic artery throughout the common hepatic artery and just into the proper hepatic artery. This is nonocclusive. No evidence of thrombus. Occlusive coil pack is present within the gastroduodenal artery extending just into the right gastroepiploic artery. SMA: Patent without evidence of aneurysm, dissection, vasculitis or significant stenosis. Renals: Both renal arteries are patent without evidence of aneurysm, dissection, vasculitis, fibromuscular dysplasia or significant stenosis. IMA: Patent without evidence of aneurysm, dissection, vasculitis or significant stenosis. Inflow: Patent without evidence of aneurysm, dissection, vasculitis or significant stenosis. Proximal Outflow: Bilateral common femoral and visualized portions of the superficial and profunda femoral arteries are patent without evidence of  aneurysm, dissection, vasculitis or significant stenosis. Veins: No focal venous abnormality. Review of the MIP images confirms the above findings. NON-VASCULAR Lower chest: Fibrotic changes in the periphery of the lower lungs. Diffuse mild bronchial wall thickening. No acute abnormality. Hepatobiliary: Multiple circumscribed  low-attenuation lesions scattered throughout the liver consistent with simple cysts. The gallbladder is surgically absent. Pancreas: Unremarkable. No pancreatic ductal dilatation or surrounding inflammatory changes. Spleen: Normal in size without focal abnormality. Adrenals/Urinary Tract: Adrenal glands are unremarkable. Kidneys are normal, without renal calculi, focal lesion, or hydronephrosis. Bladder is unremarkable. Stomach/Bowel: No evidence of active arterial extravasation of contrast material to suggest a site of active bleeding. Large duodenal ulceration again noted in the first portion of the duodenum. No evidence of active GI bleeding elsewhere in the bowel. No evidence of obstruction. Lymphatic: No suspicious lymphadenopathy. Reproductive: Surgical changes of prior hysterectomy. Other: Fenestrated anterior abdominal wall with herniation of omental fat and a small portion of the transverse colon. No ascites. Musculoskeletal: No acute fracture or aggressive appearing lytic or blastic osseous lesion. Multilevel degenerative disc disease. Mild anterolisthesis of L5 on S1. IMPRESSION: VASCULAR 1. No evidence of active bleeding at the time of imaging. 2. Successful coil embolization of the gastroduodenal and proximal right gastroepiploic arteries. 3. Nonocclusive helical material present within the proximal splenic artery, throughout the common hepatic artery and within the proximal proper hepatic artery. 4. Scattered atherosclerotic plaque. Aortic Atherosclerosis (ICD10-I70.0). NON-VASCULAR 1. No acute abnormality within the abdomen or pelvis. 2. Ancillary findings as above without  interval change compared to recent prior imaging. Electronically Signed   By: Wilkie Lent M.D.   On: 02/12/2023 10:15   Scheduled Meds:  ALPRAZolam   0.5 mg Oral QHS   Chlorhexidine  Gluconate Cloth  6 each Topical Q0600   leptospermum manuka honey  1 Application Topical Daily   pantoprazole  (PROTONIX ) IV  40 mg Intravenous Q12H   phosphorus  500 mg Oral Once   sucralfate   1 g Oral TID WC & HS   vitamin B-12  100 mcg Oral Daily   Continuous Infusions:   LOS: 3 days   Alejandro Marker, DO [facility] Available via Epic secure chat 7am-7pm After these hours, please refer to coverage provider listed on [email or web address] 02/13/2023, 8:34 PM

## 2023-02-13 NOTE — Hospital Course (Addendum)
 The patient is a 73 year old Caucasian female with past medical history significant for but also anxiety, hypertension, chronic knee pain, history of ovarian cancer status post hysterectomy/nephrectomy and chemotherapy in 2013 remission, history of neuropathy, history of provoked PE and other comorbidities including stress concern for psoriatic arthritis who presented to the [facility] ED on 02/10/2023 via EMS secondary to dark stools epigastric abdominal pain.  The onset was the day prior to admission her stool was black and the patient thought it was due to her taking hydrocodone  as 2000.  She had some pain in her epigastric region and reported radiation towards her back and denied NSAID use, BC powder or Goody powder.  She does utilize a baby aspirin  at baseline and endorses generalized fatigue and weakness.  She was FOBT positive and GI was consulted and TRH was asked admit this patient for evaluation concern for upper GI bleed.  Hemoglobin on admission was extremely low and she received 2 units of PRBCs and she improved 8.7.  She was started on IV PPI and GI to the patient for EGD and it showed a large nonobstructing duodenal ulcer a large evident clot and unroofing was not attempted.  Given that a large peptic ulcer versus a mass versus a walled off perforation could not be determined and ruled out GI recommended stat CT scan abdomen pelvis recommending IV Protonix  drip and so she is transferred to the ICU.  CTA GI bleed showed a high density focus anterior to the gastroduodenal branch celiac artery posterior to the stomach concerning for possible extravasation and previous hospitalist discussed with interventional radiology Dr. Jennefer who did not believe that there is any concern findings to suggest extra sedation.  She is hemodynamic stable without hemoptysis and conservative treatment recommended at that time however on the morning of 02/11/2023 plan was to transfer the patient to [facility] to have an IR  procedure and she ended up having a mesenteric arteriography embolization of the gastroduodenal artery.  She was transferred back from [facility] and if pain after the procedure after arrival the patient's blood pressure was low she is given fluid bolus.  General surgery was consulted and is given 2 units of FFP and another 2 units of blood.  GI was consulted and patient was transferred back to [facility] and another repeat CT angio bleeding scan which showed no evidence of active bleeding.  Currently her hemoglobin is now stable and diet has been advanced.  No further bleeding noted.  Will obtain PT OT to further evaluate and treat and monitor overnight.  Hemoglobin continues to improve and is stable.  She did well and PT OT recommending home health.  She will need to follow-up with PCP, rheumatology and gastroenterology outpatient setting.  Assessment and Plan:  Acute blood loss anemia secondary to upper GI bleed/duodenal ulcer, POA, improved -See above for clinical course -She is status post mesenteric arteriography embolization of the gastroduodenal artery is also status post an EGD by GI -Had to be transferred back from [facility] yesterday and repeat CTA done and showed VASCULAR: 1. No evidence of active bleeding at the time of imaging. 2. Successful coil embolization of the gastroduodenal and proximal right gastroepiploic arteries. 3. Nonocclusive helical material present within the proximal splenic artery, throughout the common hepatic artery and within the proximal proper hepatic artery. 4. Scattered atherosclerotic plaque. Aortic Atherosclerosis. NON-VASCULAR: 1. No acute abnormality within the abdomen or pelvis. 2. Ancillary findings as above without interval change compared to recent prior  imaging. -Anemia panel was done and showed an iron level 86, UIBC 163, TIBC 249, saturation ratio 35%, ferritin of 74 and folate level of 8.6 and vitamin B12 level 142 -Current Hgb/Hct Trend: Recent  Labs  Lab 02/12/23 0212 02/12/23 0551 02/12/23 1612 02/12/23 2056 02/13/23 0240 02/13/23 1527 02/14/23 0233  HGB 8.6* 7.9* 10.8* 10.4* 9.5* 10.1* 10.1*  HCT 25.8* 23.9* 31.2* 29.9* 27.9* 30.9* 30.0*  MCV  --   --   --   --  88.0 89.3 90.6  -Patient is status post 7 units of blood transfused and 1 unit of FFP -Now is back on IV PPI 40 mg BID and continuing sucralfate  1 g p.o. 3 times daily with meals and bedtime -Has no further active signs or symptoms of bleeding and will get PT OT to further evaluate and treat -PT OT recommending home health and she is medically stable for discharge and hemoglobin is very stable for discharge   Vitamin B12 deficiency -Vitamin B12 level was 142 -Initiate supplementation with cyanocobalamin  100 mcg p.o. daily   Hypokalemia -Patient's K+ Level Trend: Recent Labs  Lab 02/10/23 1100 02/11/23 0334 02/12/23 0212 02/13/23 0240 02/14/23 0233  K 5.3* 4.1 4.3 3.4* 3.8  -Replete with po KCL 40 mEQ q4h x2 and po K Phos  Neutral 500 mg x1 -Continue to Monitor and Replete as Necessary -Repeat CMP within 1 week  Metabolic Acidosis -Has a CO2 of 19, chloride level of 113 and anion gap of 10 yesterday and this is improved slightly to a CO2 of 21, anion gap of 9 and a chloride level of 111 -Continue to monitor and trend and repeat CMP in a.m.  Hypomagnesemia -Patient's Mag Level Trend: Recent Labs  Lab 02/13/23 0240 02/14/23 0233  MG 1.7 1.5*  -Replete with IV Mag Sulfate 4 grams -Continue to Monitor and Replete as Necessary -Repeat Mag in the AM   Hypophosphatemia -Phos Level Trend: Recent Labs  Lab 02/13/23 0240 02/14/23 0233  PHOS 2.4* 2.8  -Replete with po K Phos  Neutral 500 mg po x1 -Continue to Monitor and Trend and Repeat Phos Level in the AM   Essential Hypertension -On valsartan -HCTZ 320-12.5 mg p.o. daily at baseline PTA.  And will hold for now and can be resumed in outpatient setting -BP initially soft on admission.   -Continue  Holding antihypertensives. -Continue to monitor blood pressures per protocol -Last blood pressure reading was 122/59   Chronic Knee Pain -Avoid all NSAIDs -Continue with p.o./RC 650 mg of acetaminophen  every 6 as needed for mild pain and continue with p.o. oxycodone  5 mg every 4 as needed for moderate pain   History of ovarian cancer  -S/p hysterectomy/oophorectomy and chemotherapy 2013 in remission -Will need further evaluation by oncology in outpatient setting   Hx provoked pulmonary embolism 2013 -No longer on anticoagulation   Concern for psoriatic arthritis -Currently followed by rheumatology outpatient, seen in clinic 02/09/2023 with workup underway.  Hypoalbuminemia -Patient's Albumin  Trend: Recent Labs  Lab 02/10/23 1100 02/13/23 0240 02/14/23 0233  ALBUMIN  2.9* 2.8* 2.8*  -Continue to Monitor and Trend and repeat CMP in the AM  Class I Obesity -Complicates overall prognosis and care -Estimated body mass index is 32.25 kg/m as calculated from the following:   Height as of this encounter: 5' (1.524 m).   Weight as of this encounter: 74.9 kg.  -Weight Loss and Dietary Counseling given

## 2023-02-13 NOTE — Plan of Care (Signed)

## 2023-02-14 DIAGNOSIS — K269 Duodenal ulcer, unspecified as acute or chronic, without hemorrhage or perforation: Secondary | ICD-10-CM | POA: Diagnosis not present

## 2023-02-14 DIAGNOSIS — R58 Hemorrhage, not elsewhere classified: Secondary | ICD-10-CM | POA: Diagnosis not present

## 2023-02-14 LAB — CBC WITH DIFFERENTIAL/PLATELET
Abs Immature Granulocytes: 0.06 10*3/uL (ref 0.00–0.07)
Basophils Absolute: 0 10*3/uL (ref 0.0–0.1)
Basophils Relative: 0 %
Eosinophils Absolute: 0.4 10*3/uL (ref 0.0–0.5)
Eosinophils Relative: 6 %
HCT: 30 % — ABNORMAL LOW (ref 36.0–46.0)
Hemoglobin: 10.1 g/dL — ABNORMAL LOW (ref 12.0–15.0)
Immature Granulocytes: 1 %
Lymphocytes Relative: 22 %
Lymphs Abs: 1.7 10*3/uL (ref 0.7–4.0)
MCH: 30.5 pg (ref 26.0–34.0)
MCHC: 33.7 g/dL (ref 30.0–36.0)
MCV: 90.6 fL (ref 80.0–100.0)
Monocytes Absolute: 0.6 10*3/uL (ref 0.1–1.0)
Monocytes Relative: 8 %
Neutro Abs: 4.9 10*3/uL (ref 1.7–7.7)
Neutrophils Relative %: 63 %
Platelets: 197 10*3/uL (ref 150–400)
RBC: 3.31 MIL/uL — ABNORMAL LOW (ref 3.87–5.11)
RDW: 16.6 % — ABNORMAL HIGH (ref 11.5–15.5)
WBC: 7.8 10*3/uL (ref 4.0–10.5)
nRBC: 0 % (ref 0.0–0.2)

## 2023-02-14 LAB — COMPREHENSIVE METABOLIC PANEL
ALT: 17 U/L (ref 0–44)
AST: 16 U/L (ref 15–41)
Albumin: 2.8 g/dL — ABNORMAL LOW (ref 3.5–5.0)
Alkaline Phosphatase: 34 U/L — ABNORMAL LOW (ref 38–126)
Anion gap: 9 (ref 5–15)
BUN: 18 mg/dL (ref 8–23)
CO2: 21 mmol/L — ABNORMAL LOW (ref 22–32)
Calcium: 8.5 mg/dL — ABNORMAL LOW (ref 8.9–10.3)
Chloride: 111 mmol/L (ref 98–111)
Creatinine, Ser: 0.76 mg/dL (ref 0.44–1.00)
GFR, Estimated: >60 mL/min (ref >60)
Glucose, Bld: 95 mg/dL (ref 70–99)
Potassium: 3.8 mmol/L (ref 3.5–5.1)
Sodium: 141 mmol/L (ref 135–145)
Total Bilirubin: 0.6 mg/dL (ref 0.0–1.2)
Total Protein: 4.8 g/dL — ABNORMAL LOW (ref 6.5–8.1)

## 2023-02-14 LAB — MAGNESIUM: Magnesium: 1.5 mg/dL — ABNORMAL LOW (ref 1.7–2.4)

## 2023-02-14 LAB — PHOSPHORUS: Phosphorus: 2.8 mg/dL (ref 2.5–4.6)

## 2023-02-14 LAB — GLUCOSE, CAPILLARY: Glucose-Capillary: 94 mg/dL (ref 70–99)

## 2023-02-14 MED ORDER — PANTOPRAZOLE SODIUM 40 MG PO TBEC: 40.0000 mg | DELAYED_RELEASE_TABLET | Freq: Two times a day (BID) | ORAL | Status: DC

## 2023-02-14 MED ORDER — ONDANSETRON HCL 4 MG PO TABS: 4.0000 mg | ORAL_TABLET | Freq: Four times a day (QID) | ORAL | 0 refills | Status: DC | PRN

## 2023-02-14 MED ORDER — MEDIHONEY WOUND/BURN DRESSING EX PSTE: 1.0000 | PASTE | Freq: Every day | CUTANEOUS | 0 refills | Status: DC

## 2023-02-14 MED ORDER — CYANOCOBALAMIN 100 MCG PO TABS: 100.0000 ug | ORAL_TABLET | Freq: Every day | ORAL | 0 refills | Status: AC

## 2023-02-14 MED ORDER — MAGNESIUM SULFATE 4 GM/100ML IV SOLN
4.0000 g | Freq: Once | INTRAVENOUS | Status: AC
  Administered 2023-02-14: 4 g via INTRAVENOUS
  Filled 2023-02-14: qty 100

## 2023-02-14 MED ORDER — SUCRALFATE 1 GM/10ML PO SUSP: 1.0000 g | Freq: Three times a day (TID) | ORAL | 0 refills | Status: DC

## 2023-02-14 MED ORDER — ACETAMINOPHEN 325 MG PO TABS: 650.0000 mg | ORAL_TABLET | Freq: Four times a day (QID) | ORAL | 0 refills | Status: DC | PRN

## 2023-02-14 MED ORDER — PANTOPRAZOLE SODIUM 40 MG PO TBEC: 40.0000 mg | DELAYED_RELEASE_TABLET | Freq: Two times a day (BID) | ORAL | 0 refills | Status: DC

## 2023-02-14 NOTE — TOC Transition Note (Addendum)
 Transition of Care [facility]) - Discharge Note   Patient Details  Name: Sarah Walker MRN: [id number] Date of Birth: 1950/03/02  [facility] CM/SW Contact:  Tom-Johnson, Alleyne [place], RN Phone Number: 02/14/2023, 12:47 PM   Clinical Narrative:     Patient is scheduled for discharge today.  Readmission Risk Assessment done. Outpatient f/u, hospital f/u and discharge instructions on AVS. CM unable to secure home health RN for wound care d/t patient's location. CM made patient and daughter, Sarah Walker aware. Patient states [facility] wound care clinic in [place] is too far,  she will f/u with her PCP. BSC ordered from [employer] and [facility] to drop-ship to patient's home per patient's request.  Daughter, Sarah Walker to transport at discharge.  No further TOC needs noted.        Final next level of care: Home w Home Health Services Barriers to Discharge: Barriers Resolved   Patient Goals and CMS Choice Patient states their goals for this hospitalization and ongoing recovery are:: To return home CMS Medicare.gov Compare Post Acute Care list provided to:: Patient Choice offered to / list presented to : Patient      Discharge Placement                Patient to be transferred to facility by: Daughter Name of family member notified: [name]    Discharge Plan and Services Additional resources added to the After Visit Summary for                  DME Arranged: Bedside commode DME Agency: [employer] Date DME Agency Contacted: 02/14/23 Time DME Agency Contacted: 1154 Representative spoke with at DME Agency: Raechel Bulla HH Arranged: RN HH Agency: [facility] Date [facility] Agency Contacted: 02/14/23 Time HH Agency Contacted: 1144 Representative spoke with at [facility] Agency: Randel Buss  Social Drivers of Health (SDOH) Interventions SDOH Screenings   Food Insecurity: No Food Insecurity (02/10/2023)  Housing: Low Risk  (02/10/2023)  Transportation Needs: No Transportation Needs  (02/10/2023)  Utilities: Not At Risk (02/10/2023)  Social Connections: Socially Integrated (02/10/2023)  Tobacco Use: Low Risk  (02/10/2023)     Readmission Risk Interventions    02/14/2023   12:45 PM  Readmission Risk Prevention Plan  Transportation Screening Complete  PCP or Specialist Appt within 5-7 Days Complete  Home Care Screening Complete  Medication Review (RN CM) Referral to Pharmacy

## 2023-02-14 NOTE — Evaluation (Signed)
 Occupational Therapy Evaluation Patient Details Name: Sarah Walker MRN: [id number] DOB: [birth year] Today's Date: 02/14/2023   History of Present Illness Pt is a 73 y/o F presenting to ED on 2/8 wtih dark/bloody stools, generalized weakness, EGD with large blood clotted in stomach, admitted for acute upper GI bleed. RR called 2/7 for bloody BM and hypotension requiring 2 units of blood/FFP. Repeat CT angio without bleeding. PMH includes HTN, anxiety, chronic knee pain, ovarian CA s/p hysterectomy/oopherectomy and chemothereapy 2013 in remission, neuropathy.   Clinical Impression   Pt reports ind at baseline with ADLs and functional mobility, household ambulator without AD but uses cane/RW in community. Pt lives with spouse. Pt reports feeling weaker than baseline, needs up to mod A for ADLs (pt reports typically using AD  for LB ADL), min A for bed mobility and CGA for transfers with RW. Pt presenting with impairments listed below, will follow acutely. Recommend HHOT at d/c pending progression.      If plan is discharge home, recommend the following: A little help with walking and/or transfers;A little help with bathing/dressing/bathroom;Assistance with cooking/housework;Direct supervision/assist for medications management;Direct supervision/assist for financial management;Assist for transportation    Functional Status Assessment  Patient has had a recent decline in their functional status and demonstrates the ability to make significant improvements in function in a reasonable and predictable amount of time.  Equipment Recommendations  BSC/3in1    Recommendations for Other Services PT consult     Precautions / Restrictions Precautions Precautions: Fall Restrictions Weight Bearing Restrictions Per Provider Order: No      Mobility Bed Mobility Overal bed mobility: Needs Assistance Bed Mobility: Sit to Supine       Sit to supine: Min assist   General bed mobility comments: min A  to assist BLEs    Transfers Overall transfer level: Needs assistance Equipment used: Rolling walker (2 wheels) Transfers: Sit to/from Stand Sit to Stand: Contact guard assist                  Balance Overall balance assessment: Needs assistance Sitting-balance support: Feet supported Sitting balance-Leahy Scale: Good Sitting balance - Comments: sits EOB unsupported   Standing balance support: During functional activity, Reliant on assistive device for balance Standing balance-Leahy Scale: Good Standing balance comment: RW                           ADL either performed or assessed with clinical judgement   ADL Overall ADL's : Needs assistance/impaired Eating/Feeding: Set up   Grooming: Oral care;Standing   Upper Body Bathing: Minimal assistance   Lower Body Bathing: Moderate assistance   Upper Body Dressing : Minimal assistance   Lower Body Dressing: Moderate assistance   Toilet Transfer: Contact guard assist;Ambulation;Regular Toilet;Rolling walker (2 wheels)   Toileting- Clothing Manipulation and Hygiene: Contact guard assist       Functional mobility during ADLs: Contact guard assist;Rolling walker (2 wheels)       Vision   Vision Assessment?: No apparent visual deficits     Perception Perception: Not tested       Praxis Praxis: Not tested       Pertinent Vitals/Pain Pain Assessment Pain Assessment: No/denies pain     Extremity/Trunk Assessment Upper Extremity Assessment Upper Extremity Assessment: Generalized weakness   Lower Extremity Assessment Lower Extremity Assessment: Defer to PT evaluation (reports recent L knee replacement last year)   Cervical / Trunk Assessment Cervical / Trunk Assessment: Normal  Communication Communication Communication: No apparent difficulties   Cognition Arousal: Alert Behavior During Therapy: WFL for tasks assessed/performed Overall Cognitive Status: Within Functional Limits for tasks  assessed                                       General Comments  VSS on RA    Exercises     Shoulder Instructions      Home Living Family/patient expects to be discharged to:: Private residence Living Arrangements: Spouse/significant other Available Help at Discharge: Family;Available 24 hours/day Type of Home: House Home Access: Level entry     Home Layout: Multi-level;Other (Comment) Alternate Level Stairs-Number of Steps: 4 Alternate Level Stairs-Rails: Right Bathroom Shower/Tub: Producer, television/film/video: Handicapped height Bathroom Accessibility: Yes   Home Equipment: Agricultural consultant (2 wheels);Cane - single point;Grab bars - toilet;Grab bars - tub/shower;BSC/3in1;Shower seat;Adaptive equipment Adaptive Equipment: Sock aid;Reacher        Prior Functioning/Environment Prior Level of Function : Independent/Modified Independent;Driving             Mobility Comments: no AD use inside, cane/RW for community mobility ADLs Comments: ind with ADls        OT Problem List: Decreased strength;Decreased range of motion;Decreased activity tolerance;Impaired balance (sitting and/or standing);Decreased cognition;Decreased safety awareness      OT Treatment/Interventions: Self-care/ADL training;Therapeutic exercise;Energy conservation;DME and/or AE instruction;Therapeutic activities;Patient/family education;Balance training    OT Goals(Current goals can be found in the care plan section) Acute Rehab OT Goals Patient Stated Goal: none stated OT Goal Formulation: With patient Time For Goal Achievement: 02/28/23 Potential to Achieve Goals: Good ADL Goals Pt Will Perform Upper Body Dressing: with supervision;sitting Pt Will Perform Lower Body Dressing: with supervision;sit to/from stand;sitting/lateral leans;with adaptive equipment Pt Will Transfer to Toilet: with supervision;ambulating;regular height toilet Pt Will Perform Tub/Shower Transfer:  Shower transfer;with supervision;ambulating;shower seat  OT Frequency: Min 1X/week    Co-evaluation              AM-PAC OT "6 Clicks" Daily Activity     Outcome Measure Help from another person eating meals?: None Help from another person taking care of personal grooming?: A Little Help from another person toileting, which includes using toliet, bedpan, or urinal?: A Little Help from another person bathing (including washing, rinsing, drying)?: A Lot Help from another person to put on and taking off regular upper body clothing?: A Little Help from another person to put on and taking off regular lower body clothing?: A Lot 6 Click Score: 17   End of Session Equipment Utilized During Treatment: Gait belt;Rolling walker (2 wheels) Nurse Communication: Mobility status  Activity Tolerance: Patient tolerated treatment well Patient left: in bed;with call bell/phone within reach;with bed alarm set  OT Visit Diagnosis: Unsteadiness on feet (R26.81);Other abnormalities of gait and mobility (R26.89);Muscle weakness (generalized) (M62.81)                Time: 7829-5621 OT Time Calculation (min): 19 min Charges:  OT General Charges $OT Visit: 1 Visit OT Evaluation $OT Eval Moderate Complexity: 1 Mod  Silas Muff K, OTD, OTR/L SecureChat Preferred Acute Rehab [phone]   Benedict Brain Koonce 02/14/2023, 8:49 AM

## 2023-02-14 NOTE — Evaluation (Signed)
 Physical Therapy Evaluation Patient Details Name: LEGACIE DETORE MRN: [id number] DOB: 1950-10-31 Today's Date: 02/14/2023  History of Present Illness  Pt is a 73 y/o F presenting to ED on 2/8 wtih dark/bloody stools, generalized weakness, EGD with large blood clotted in stomach, admitted for acute upper GI bleed. RR called 2/7 for bloody BM and hypotension requiring 2 units of blood/FFP. Repeat CT angio without bleeding. PMH includes HTN, anxiety, chronic knee pain, ovarian CA s/p hysterectomy/oopherectomy and chemothereapy 2013 in remission, neuropathy.  Clinical Impression  Pt presents to PT with deficits in activity tolerance, power, balance. Pt is mobilizing well, ambulating for household distances with support of a RW. PT encourages frequent mobilization at the time of discharge, along with use of DME initially with a transition back to independent household ambulation without assistive devices. PT anticipates no post-acute needs.        If plan is discharge home, recommend the following:     Can travel by private vehicle        Equipment Recommendations None recommended by PT  Recommendations for Other Services       Functional Status Assessment Patient has had a recent decline in their functional status and demonstrates the ability to make significant improvements in function in a reasonable and predictable amount of time.     Precautions / Restrictions Precautions Precautions: Fall Restrictions Weight Bearing Restrictions Per Provider Order: No      Mobility  Bed Mobility Overal bed mobility: Needs Assistance Bed Mobility: Supine to Sit, Sit to Supine     Supine to sit: Supervision Sit to supine: Min assist        Transfers Overall transfer level: Needs assistance Equipment used: Rolling walker (2 wheels) Transfers: Sit to/from Stand Sit to Stand: Supervision                Ambulation/Gait Ambulation/Gait assistance: Modified independent  (Device/Increase time) Gait Distance (Feet): 300 Feet Assistive device: Rolling walker (2 wheels) Gait Pattern/deviations: Step-through pattern Gait velocity: reduced Gait velocity interpretation: <1.8 ft/sec, indicate of risk for recurrent falls   General Gait Details: slowed step-through gait  Stairs            Wheelchair Mobility     Tilt Bed    Modified Rankin (Stroke Patients Only)       Balance Overall balance assessment: Needs assistance Sitting-balance support: No upper extremity supported, Feet supported Sitting balance-Leahy Scale: Good     Standing balance support: Bilateral upper extremity supported, Reliant on assistive device for balance Standing balance-Leahy Scale: Poor                               Pertinent Vitals/Pain Pain Assessment Pain Assessment: No/denies pain    Home Living Family/patient expects to be discharged to:: Private residence Living Arrangements: Spouse/significant other Available Help at Discharge: Family;Available 24 hours/day Type of Home: House Home Access: Level entry     Alternate Level Stairs-Number of Steps: 4 Home Layout: Multi-level;Other (Comment) Home Equipment: Rolling Walker (2 wheels);Cane - single point;Grab bars - toilet;Grab bars - tub/shower;BSC/3in1;Shower seat;Adaptive equipment      Prior Function Prior Level of Function : Independent/Modified Independent;Driving             Mobility Comments: no AD use inside, cane/RW for community mobility ADLs Comments: ind with ADls     Extremity/Trunk Assessment   Upper Extremity Assessment Upper Extremity Assessment: Overall WFL for tasks assessed  Lower Extremity Assessment Lower Extremity Assessment: Overall WFL for tasks assessed    Cervical / Trunk Assessment Cervical / Trunk Assessment: Normal  Communication   Communication Communication: No apparent difficulties Cueing Techniques: Verbal cues  Cognition Arousal:  Alert Behavior During Therapy: WFL for tasks assessed/performed Overall Cognitive Status: Within Functional Limits for tasks assessed                                          General Comments General comments (skin integrity, edema, etc.): VSS on RA    Exercises     Assessment/Plan    PT Assessment Patient needs continued PT services  PT Problem List Decreased activity tolerance;Decreased balance;Decreased mobility       PT Treatment Interventions DME instruction;Gait training;Stair training;Functional mobility training;Therapeutic activities;Balance training;Therapeutic exercise;Neuromuscular re-education;Patient/family education    PT Goals (Current goals can be found in the Care Plan section)  Acute Rehab PT Goals Patient Stated Goal: to return home PT Goal Formulation: With patient Time For Goal Achievement: 02/28/23 Potential to Achieve Goals: Good    Frequency Min 1X/week     Co-evaluation               AM-PAC PT "6 Clicks" Mobility  Outcome Measure Help needed turning from your back to your side while in a flat bed without using bedrails?: None Help needed moving from lying on your back to sitting on the side of a flat bed without using bedrails?: A Little Help needed moving to and from a bed to a chair (including a wheelchair)?: A Little Help needed standing up from a chair using your arms (e.g., wheelchair or bedside chair)?: A Little Help needed to walk in hospital room?: None Help needed climbing 3-5 steps with a railing? : A Little 6 Click Score: 20    End of Session   Activity Tolerance: Patient tolerated treatment well Patient left: in bed;with call bell/phone within reach Nurse Communication: Mobility status PT Visit Diagnosis: Other abnormalities of gait and mobility (R26.89)    Time: 8119-1478 PT Time Calculation (min) (ACUTE ONLY): 20 min   Charges:   PT Evaluation $PT Eval Low Complexity: 1 Low   PT General  Charges $$ ACUTE PT VISIT: 1 Visit         Rexie Catena, PT, DPT Acute Rehabilitation Office [phone]   Rexie Catena 02/14/2023, 10:26 AM

## 2023-02-14 NOTE — Progress Notes (Signed)
    Durable Medical Equipment  (From admission, onward)           Start     Ordered   02/14/23 1155  For home use only DME Bedside commode  Once       Question:  Patient needs a bedside commode to treat with the following condition  Answer:  Weakness   02/14/23 1155

## 2023-02-14 NOTE — Discharge Summary (Signed)
Physician Discharge Summary   Patient: Sarah Walker MRN: [id number] DOB: 1950-02-18  Admit date:     02/10/2023  Discharge date: 02/14/23  Discharge Physician: Marguerita Merles, DO   PCP: Assunta Found, MD   Recommendations at discharge:   Follow-up with PCP within 1 to 2 weeks repeat CBC, CMP, mag, Phos within 1 week Follow-up with rheumatology in outpatient setting within 1 to 2 weeks Follow-up with gastroenterology in outpatient setting PPI twice daily for 2 months at least and follow-up with GI within 1 to 2 weeks  Discharge Diagnoses: Principal Problem:   Acute upper GI bleed Active Problems:   Duodenal ulcer   Acute GI bleeding   Hemorrhage requiring transfusion  Resolved Problems:   * No resolved hospital problems. [facility] Course: The patient is a 73 year old Caucasian female with past medical history significant for but also anxiety, hypertension, chronic knee pain, history of ovarian cancer status post hysterectomy/nephrectomy and chemotherapy in 2013 remission, history of neuropathy, history of provoked PE and other comorbidities including stress concern for psoriatic arthritis who presented to the [facility] ED on 02/10/2023 via EMS secondary to dark stools epigastric abdominal pain.  The onset was the day prior to admission her stool was black and the patient thought it was due to her taking hydrocodone as 2000.  She had some pain in her epigastric region and reported radiation towards her back and denied NSAID use, BC powder or Goody powder.  She does utilize a baby aspirin at baseline and endorses generalized fatigue and weakness.  She was FOBT positive and GI was consulted and TRH was asked admit this patient for evaluation concern for upper GI bleed.  Hemoglobin on admission was extremely low and she received 2 units of PRBCs and she improved 8.7.  She was started on IV PPI and GI to the patient for EGD and it showed a large nonobstructing duodenal ulcer a large evident clot and  unroofing was not attempted.  Given that a large peptic ulcer versus a mass versus a walled off perforation could not be determined and ruled out GI recommended stat CT scan abdomen pelvis recommending IV Protonix drip and so she is transferred to the ICU.  CTA GI bleed showed a high density focus anterior to the gastroduodenal branch celiac artery posterior to the stomach concerning for possible extravasation and previous hospitalist discussed with interventional radiology Dr. Elby Showers who did not believe that there is any concern findings to suggest extra sedation.  She is hemodynamic stable without hemoptysis and conservative treatment recommended at that time however on the morning of 02/11/2023 plan was to transfer the patient to [facility] to have an IR procedure and she ended up having a mesenteric arteriography" embolization of the gastroduodenal artery.  She was transferred back from [facility] and if pain after the procedure after arrival the patient's blood pressure was low she is given fluid bolus.  General surgery was consulted and is given 2 units of FFP and another 2 units of blood.  GI was consulted and patient was transferred back to [facility] and another repeat CT angio bleeding scan which showed no evidence of active bleeding.  Currently her hemoglobin is now stable and diet has been advanced.  No further bleeding noted.  Will obtain PT OT to further evaluate and treat and monitor overnight.  Hemoglobin continues to improve and is stable.  She did well and PT OT recommending home health.  She will need to follow-up with PCP,  rheumatology and gastroenterology outpatient setting.  Assessment and Plan:  Acute blood loss anemia secondary to upper GI bleed/duodenal ulcer, POA, improved -See above for clinical course -She is status post mesenteric arteriography" embolization of the gastroduodenal artery is also status post an EGD by GI -Had to be transferred back from [facility] yesterday and  repeat CTA done and showed "VASCULAR: 1. No evidence of active bleeding at the time of imaging. 2. Successful coil embolization of the gastroduodenal and proximal right gastroepiploic arteries. 3. Nonocclusive helical material present within the proximal splenic artery, throughout the common hepatic artery and within the proximal proper hepatic artery. 4. Scattered atherosclerotic plaque. Aortic Atherosclerosis. NON-VASCULAR: 1. No acute abnormality within the abdomen or pelvis. 2. Ancillary findings as above without interval change compared to recent prior imaging. -Anemia panel was done and showed an iron level 86, UIBC 163, TIBC 249, saturation ratio 35%, ferritin of 74 and folate level of 8.6 and vitamin B12 level 142 -Current Hgb/Hct Trend: Recent Labs  Lab 02/12/23 0212 02/12/23 0551 02/12/23 1612 02/12/23 2056 02/13/23 0240 02/13/23 1527 02/14/23 0233  HGB 8.6* 7.9* 10.8* 10.4* 9.5* 10.1* 10.1*  HCT 25.8* 23.9* 31.2* 29.9* 27.9* 30.9* 30.0*  MCV  --   --   --   --  88.0 89.3 90.6  -Patient is status post 7 units of blood transfused and 1 unit of FFP -Now is back on IV PPI 40 mg BID and continuing sucralfate 1 g p.o. 3 times daily with meals and bedtime -Has no further active signs or symptoms of bleeding and will get PT OT to further evaluate and treat -PT OT recommending home health and she is medically stable for discharge and hemoglobin is very stable for discharge   Vitamin B12 deficiency -Vitamin B12 level was 142 -Initiate supplementation with cyanocobalamin 100 mcg p.o. daily   Hypokalemia -Patient's K+ Level Trend: Recent Labs  Lab 02/10/23 1100 02/11/23 0334 02/12/23 0212 02/13/23 0240 02/14/23 0233  K 5.3* 4.1 4.3 3.4* 3.8  -Replete with po KCL 40 mEQ q4h x2 and po K Phos Neutral 500 mg x1 -Continue to Monitor and Replete as Necessary -Repeat CMP within 1 week  Metabolic Acidosis -Has a CO2 of 19, chloride level of 113 and anion gap of 10 yesterday and this  is improved slightly to a CO2 of 21, anion gap of 9 and a chloride level of 111 -Continue to monitor and trend and repeat CMP in a.m.  Hypomagnesemia -Patient's Mag Level Trend: Recent Labs  Lab 02/13/23 0240 02/14/23 0233  MG 1.7 1.5*  -Replete with IV Mag Sulfate 4 grams -Continue to Monitor and Replete as Necessary -Repeat Mag in the AM   Hypophosphatemia -Phos Level Trend: Recent Labs  Lab 02/13/23 0240 02/14/23 0233  PHOS 2.4* 2.8  -Replete with po K Phos Neutral 500 mg po x1 -Continue to Monitor and Trend and Repeat Phos Level in the AM   Essential Hypertension -On valsartan-HCTZ 320-12.5 mg p.o. daily at baseline PTA.  And will hold for now and can be resumed in outpatient setting -BP initially soft on admission.   -Continue Holding antihypertensives. -Continue to monitor blood pressures per protocol -Last blood pressure reading was 122/59   Chronic Knee Pain -Avoid all NSAIDs -Continue with p.o./RC 650 mg of acetaminophen every 6 as needed for mild pain and continue with p.o. oxycodone 5 mg every 4 as needed for moderate pain   History of ovarian cancer  -S/p hysterectomy/oophorectomy and chemotherapy 2013 in remission -  Will need further evaluation by oncology in outpatient setting   Hx provoked pulmonary embolism 2013 -No longer on anticoagulation   Concern for psoriatic arthritis -Currently followed by rheumatology outpatient, seen in clinic 02/09/2023 with workup underway.  Hypoalbuminemia -Patient's Albumin Trend: Recent Labs  Lab 02/10/23 1100 02/13/23 0240 02/14/23 0233  ALBUMIN 2.9* 2.8* 2.8*  -Continue to Monitor and Trend and repeat CMP in the AM  Class I Obesity -Complicates overall prognosis and care -Estimated body mass index is 32.25 kg/m as calculated from the following:   Height as of this encounter: 5' (1.524 m).   Weight as of this encounter: 74.9 kg.  -Weight Loss and Dietary Counseling given  Pressure Ulcer, poA Active Pressure  Injury/Wound(s)     Pressure Ulcer  Duration          Pressure Injury 02/10/23 Buttocks Right;Lower Stage 3 -  Full thickness tissue loss. Subcutaneous fat may be visible but bone, tendon or muscle are NOT exposed. Pink, red, white, open 5 days   Pressure Injury 02/10/23 Buttocks Right;Mid Stage 3 -  Full thickness tissue loss. Subcutaneous fat may be visible but bone, tendon or muscle are NOT exposed. Pink, red, white, open 5 days   Pressure Injury 02/10/23 Buttocks Right;Upper Stage 3 -  Full thickness tissue loss. Subcutaneous fat may be visible but bone, tendon or muscle are NOT exposed. pink, red, white, open 5 days           Consultants: Gastroenterology, IR, General Surgery Procedures performed: As delineated as above Disposition: Home health Diet recommendation:  Discharge Diet Orders (From admission, onward)     Start     Ordered   02/14/23 0000  Diet - low sodium heart healthy        02/14/23 1120           Cardiac diet DISCHARGE MEDICATION: Allergies as of 02/14/2023       Reactions   Erythromycin Itching   Codeine Nausea And Vomiting   Not right away after a couple days of taking        Medication List     PAUSE taking these medications    aspirin EC 81 MG tablet Wait to take this until your doctor or other care provider tells you to start again. Take 81 mg by mouth daily. Swallow whole.       STOP taking these medications    acitretin 25 MG capsule Commonly known as: SORIATANE   bismuth subsalicylate 262 MG/15ML suspension Commonly known as: PEPTO BISMOL   meloxicam 15 MG tablet Commonly known as: MOBIC   mupirocin ointment 2 % Commonly known as: BACTROBAN       TAKE these medications    acetaminophen 325 MG tablet Commonly known as: TYLENOL Take 2 tablets (650 mg total) by mouth every 6 (six) hours as needed for mild pain (pain score 1-3) (or Fever >/= 101). What changed:  medication strength how much to take when to take  this reasons to take this   ALPRAZolam 0.5 MG tablet Commonly known as: XANAX Take 0.5 mg by mouth at bedtime.   azelastine 0.05 % ophthalmic solution Commonly known as: OPTIVAR Place 1 drop into both eyes daily as needed (rag weed).   cyanocobalamin 100 MCG tablet Take 1 tablet (100 mcg total) by mouth daily.   fexofenadine 180 MG tablet Commonly known as: ALLEGRA Take 180 mg by mouth daily as needed for allergies or rhinitis.   fluticasone 50 MCG/ACT nasal spray Commonly known  as: FLONASE Place 1 spray into both nostrils daily as needed for allergies or rhinitis.   HYDROcodone-acetaminophen 5-325 MG tablet Commonly known as: NORCO/VICODIN Take 1-2 tablets by mouth every 6 (six) hours as needed for moderate pain (pain score 4-6).   Klor-Con M20 20 MEQ tablet Generic drug: potassium chloride SA TAKE 1 TABLET BY MOUTH EVERY DAY What changed: how much to take   leptospermum manuka honey Pste paste Apply 1 Application topically daily.   ondansetron 4 MG tablet Commonly known as: ZOFRAN Take 1 tablet (4 mg total) by mouth every 6 (six) hours as needed for nausea. What changed:  when to take this reasons to take this   pantoprazole 40 MG tablet Commonly known as: PROTONIX Take 1 tablet (40 mg total) by mouth 2 (two) times daily.   sennosides-docusate sodium 8.6-50 MG tablet Commonly known as: SENOKOT-S Take 2 tablets by mouth daily.   sucralfate 1 GM/10ML suspension Commonly known as: CARAFATE Take 10 mLs (1 g total) by mouth 4 (four) times daily -  with meals and at bedtime.   valsartan-hydrochlorothiazide 320-12.5 MG tablet Commonly known as: DIOVAN-HCT Take 1 tablet by mouth daily.               Discharge Care Instructions  (From admission, onward)           Start     Ordered   02/14/23 0000  Discharge wound care:       Comments: Clean R buttocks wounds with NS, apply Medihoney to wound bed daily. Cover with dry gauze and silicone foam. May lift  foam daily to replace Medihoney.  Change foam 3 days and prn soiling.   02/14/23 1120            Follow-up Information     Assunta Found, MD. Call.   Specialty: Family Medicine Why: Follow up within 1 week Contact information: [address] [place] [address] [phone]         Dolores Frame, MD. Call.   Specialty: Gastroenterology Why: Follow up within 1-2 weeks Contact information: [address] [place] [address] [phone]         Annie Paras, MD. Call.   Specialty: Rheumatology Why: Follow up for further evaluation Contact information: [address] [place] [address] [phone]                Discharge Exam: Ceasar Mons Weights   02/10/23 1246 02/11/23 0600 02/12/23 1238  Weight: 76.7 kg 73.8 kg 74.9 kg   Vitals:   02/14/23 0741 02/14/23 1200  BP:  138/78  Pulse:  60  Resp:  13  Temp: 97.9 F (36.6 C)   SpO2:  96%   Examination: Physical Exam:  Constitutional: WN/WD obese Caucasian female in no acute distress Respiratory: Diminished to auscultation bilaterally, no wheezing, rales, rhonchi or crackles. Normal respiratory effort and patient is not tachypenic. No accessory muscle use.  Unlabored breathing Cardiovascular: RRR, no murmurs / rubs / gallops. S1 and S2 auscultated. No extremity edema.  Abdomen: Soft, non-tender, distended secondary habitus. Bowel sounds positive.  GU: Deferred. Musculoskeletal: No clubbing / cyanosis of digits/nails. No joint deformity upper and lower extremities. Skin: No rashes, lesions, ulcers on limited skin evaluation. No induration; Warm and dry.  Neurologic: CN 2-12 grossly intact with no focal deficits. Romberg sign and cerebellar reflexes not assessed.  Psychiatric: Normal judgment and insight. Alert and oriented x 3. Normal mood and appropriate affect.  Condition at discharge: stable  The results of significant diagnostics from this  hospitalization (including imaging, microbiology, ancillary and laboratory) are listed below for reference.   Imaging Studies: CT ANGIO GI BLEED Result Date: 02/12/2023 CLINICAL DATA:  73 year old female with persistent hematochezia EXAM: CTA ABDOMEN AND PELVIS WITHOUT AND WITH CONTRAST TECHNIQUE: Multidetector CT imaging of the abdomen and pelvis was performed using the standard protocol during bolus administration of intravenous contrast. Multiplanar reconstructed images and MIPs were obtained and reviewed to evaluate the vascular anatomy. RADIATION DOSE REDUCTION: This exam was performed according to the departmental dose-optimization program which includes automated exposure control, adjustment of the mA and/or kV according to patient size and/or use of iterative reconstruction technique. CONTRAST:  OMNIPAQUE IOHEXOL 350 MG/ML SOLN COMPARISON:  CTA abdomen and pelvis 02/10/2023 FINDINGS: VASCULAR Aorta: Tortuous descending thoracic aorta. Scattered atherosclerotic plaque. No aneurysm or dissection. Celiac: The celiac axis, left gastric and splenic arteries are all normal in appearance. Nonocclusive helical material is present extending from the origin of the splenic artery throughout the common hepatic artery and just into the proper hepatic artery. This is nonocclusive. No evidence of thrombus. Occlusive coil pack is present within the gastroduodenal artery extending just into the right gastroepiploic artery. SMA: Patent without evidence of aneurysm, dissection, vasculitis or significant stenosis. Renals: Both renal arteries are patent without evidence of aneurysm, dissection, vasculitis, fibromuscular dysplasia or significant stenosis. IMA: Patent without evidence of aneurysm, dissection, vasculitis or significant stenosis. Inflow: Patent without evidence of aneurysm, dissection, vasculitis or significant stenosis. Proximal Outflow: Bilateral common femoral and visualized portions of the superficial  and profunda femoral arteries are patent without evidence of aneurysm, dissection, vasculitis or significant stenosis. Veins: No focal venous abnormality. Review of the MIP images confirms the above findings. NON-VASCULAR Lower chest: Fibrotic changes in the periphery of the lower lungs. Diffuse mild bronchial wall thickening. No acute abnormality. Hepatobiliary: Multiple circumscribed low-attenuation lesions scattered throughout the liver consistent with simple cysts. The gallbladder is surgically absent. Pancreas: Unremarkable. No pancreatic ductal dilatation or surrounding inflammatory changes. Spleen: Normal in size without focal abnormality. Adrenals/Urinary Tract: Adrenal glands are unremarkable. Kidneys are normal, without renal calculi, focal lesion, or hydronephrosis. Bladder is unremarkable. Stomach/Bowel: No evidence of active arterial extravasation of contrast material to suggest a site of active bleeding. Large duodenal ulceration again noted in the first portion of the duodenum. No evidence of active GI bleeding elsewhere in the bowel. No evidence of obstruction. Lymphatic: No suspicious lymphadenopathy. Reproductive: Surgical changes of prior hysterectomy. Other: Fenestrated anterior abdominal wall with herniation of omental fat and a small portion of the transverse colon. No ascites. Musculoskeletal: No acute fracture or aggressive appearing lytic or blastic osseous lesion. Multilevel degenerative disc disease. Mild anterolisthesis of L5 on S1. IMPRESSION: VASCULAR 1. No evidence of active bleeding at the time of imaging. 2. Successful coil embolization of the gastroduodenal and proximal right gastroepiploic arteries. 3. Nonocclusive helical material present within the proximal splenic artery, throughout the common hepatic artery and within the proximal proper hepatic artery. 4. Scattered atherosclerotic plaque. Aortic Atherosclerosis (ICD10-I70.0). NON-VASCULAR 1. No acute abnormality within the  abdomen or pelvis. 2. Ancillary findings as above without interval change compared to recent prior imaging. Electronically Signed   By: Malachy Moan M.D.   On: 02/12/2023 10:15   IR EMBO ART  VEN HEMORR LYMPH EXTRAV  INC GUIDE ROADMAPPING Result Date: 02/11/2023 INDICATION: Upper GI bleed.  Duodenal ulcer. EXAM: Procedures: 1. MESENTERIC ARTERIOGRAPHY, INCLUDING CELIAC, COMMON HEPATIC AND GASTRODUODENAL  ARTERIOGRAMS 2. COIL EMBOLIZATION OF GASTRODUODENAL ARTERY COMPARISON:  CT AP, 02/10/2023. MEDICATIONS: None ANESTHESIA/SEDATION: Local anesthetic was administered. The patient was continuously monitored during the procedure by the interventional radiology nurse under my direct supervision. CONTRAST:  90 mL Omnipaque 300 FLUOROSCOPY TIME:  Fluoroscopic dose; 304 mGy COMPLICATIONS: None immediate. PROCEDURE: Informed consent was obtained from the patient and/or patient's representative following explanation of the procedure, risks, benefits and alternatives. All questions were addressed. A time out was performed prior to the initiation of the procedure. Maximal barrier sterile technique utilized including caps, mask, sterile gowns, sterile gloves, large sterile drape, hand hygiene, and chlorhexidine prep. The RIGHT femoral head was marked fluoroscopically. Under sterile conditions and local anesthesia, the RIGHT common femoral artery access was performed with a micropuncture needle. Under direct ultrasound guidance, the RIGHT common femoral was accessed with a micropuncture kit. An ultrasound image was saved for documentation purposes. This allowed for placement of a 5 Fr vascular sheath. A limited arteriogram was performed through the side arm of the sheath confirming appropriate access within the RIGHT common femoral artery. A limited abdominal aortogram was performed to help identify the celiac artery origin. Over a Bentson wire, a recurve (Chung B) catheter was advanced, back bled and flushed. The catheter  was then utilized to select the celiac artery and a selective celiac arteriogram was performed. Using a 2.4 Fr Progreat microcatheter and 0.016 inch Fathom microwire access into the gastroduodenal artery was performed and a selective arteriogram was performed. Selective embolization with multiple 0.018 inch Terumo micro coils was performed. A "fluoropak" packing coil was utilized to complete the embolization and was deployed, however the deployment mechanism was lodged within the common hepatic artery. An attempt at capture with a um loop snare was performed but unsuccessful. The microcatheter was therefore removed and arteriogram with the 5 Fr catheter at the common hepatic artery was performed. Adequate pruning of the GDA feeder arteries was achieved on post embolization arteriogram. Images were reviewed and the procedure was terminated. All wires, catheters and sheaths were removed from the patient. Hemostasis was achieved at the RIGHT groin access site with Angio-Seal closure device. The patient tolerated the procedure well without immediate post procedural complication. FINDINGS: *access via the RIGHT femoral artery. *Gastroduodenal arteriogram without angiographic evidence of active contrast extravasation. *Successful embolization with adequate pruning of the GDA distribution arteries. *Retained foreign body with the coil deployment string mechanism lodged within the [facility] *Angio-Seal closure at RIGHT groin. Palpable RLE pulses at the end of the Case IMPRESSION: 1. Successful coil embolization of the gastroduodenal artery for persistent upper GI bleed. 2. No angiographic evidence of active contrast extravasation. 3. Retained foreign body with the coil deployment string mechanism lodged within the [facility]. PLAN: - The patient is to remain flat for 2 hours with RIGHT leg straight. - The patient may continue to experience residual melena however should resolve in the coming days. Roanna Banning, MD [facility] Radiology Electronically Signed   By: Roanna Banning M.D.   On: 02/11/2023 18:47   IR US Guide Vasc Access Right Result Date: 02/11/2023 INDICATION: Upper GI bleed.  Duodenal ulcer. EXAM: Procedures: 1. MESENTERIC ARTERIOGRAPHY, INCLUDING CELIAC, COMMON HEPATIC AND GASTRODUODENAL ARTERIOGRAMS 2. COIL EMBOLIZATION OF GASTRODUODENAL ARTERY COMPARISON:  CT AP, 02/10/2023. MEDICATIONS: None ANESTHESIA/SEDATION: Local anesthetic was administered. The patient was continuously monitored during the procedure by the interventional radiology nurse under my direct supervision. CONTRAST:  90 mL Omnipaque 300 FLUOROSCOPY TIME:  Fluoroscopic dose; 304  mGy COMPLICATIONS: None immediate. PROCEDURE: Informed consent was obtained from the patient and/or patient's representative following explanation of the procedure, risks, benefits and alternatives. All questions were addressed. A time out was performed prior to the initiation of the procedure. Maximal barrier sterile technique utilized including caps, mask, sterile gowns, sterile gloves, large sterile drape, hand hygiene, and chlorhexidine prep. The RIGHT femoral head was marked fluoroscopically. Under sterile conditions and local anesthesia, the RIGHT common femoral artery access was performed with a micropuncture needle. Under direct ultrasound guidance, the RIGHT common femoral was accessed with a micropuncture kit. An ultrasound image was saved for documentation purposes. This allowed for placement of a 5 Fr vascular sheath. A limited arteriogram was performed through the side arm of the sheath confirming appropriate access within the RIGHT common femoral artery. A limited abdominal aortogram was performed to help identify the celiac artery origin. Over a Bentson wire, a recurve (Chung B) catheter was advanced, back bled and flushed. The catheter was then utilized to select the celiac artery and a selective celiac arteriogram was  performed. Using a 2.4 Fr Progreat microcatheter and 0.016 inch Fathom microwire access into the gastroduodenal artery was performed and a selective arteriogram was performed. Selective embolization with multiple 0.018 inch Terumo micro coils was performed. A "fluoropak" packing coil was utilized to complete the embolization and was deployed, however the deployment mechanism was lodged within the common hepatic artery. An attempt at capture with a um loop snare was performed but unsuccessful. The microcatheter was therefore removed and arteriogram with the 5 Fr catheter at the common hepatic artery was performed. Adequate pruning of the GDA feeder arteries was achieved on post embolization arteriogram. Images were reviewed and the procedure was terminated. All wires, catheters and sheaths were removed from the patient. Hemostasis was achieved at the RIGHT groin access site with Angio-Seal closure device. The patient tolerated the procedure well without immediate post procedural complication. FINDINGS: *access via the RIGHT femoral artery. *Gastroduodenal arteriogram without angiographic evidence of active contrast extravasation. *Successful embolization with adequate pruning of the GDA distribution arteries. *Retained foreign body with the coil deployment string mechanism lodged within the [facility] *Angio-Seal closure at RIGHT groin. Palpable RLE pulses at the end of the Case IMPRESSION: 1. Successful coil embolization of the gastroduodenal artery for persistent upper GI bleed. 2. No angiographic evidence of active contrast extravasation. 3. Retained foreign body with the coil deployment string mechanism lodged within the [facility]. PLAN: - The patient is to remain flat for 2 hours with RIGHT leg straight. - The patient may continue to experience residual melena however should resolve in the coming days. Roanna Banning, MD [facility] Radiology Electronically Signed   By: Roanna Banning M.D.   On: 02/11/2023 18:47   IR Angiogram Visceral Selective Result Date: 02/11/2023 INDICATION: Upper GI bleed.  Duodenal ulcer. EXAM: Procedures: 1. MESENTERIC ARTERIOGRAPHY, INCLUDING CELIAC, COMMON HEPATIC AND GASTRODUODENAL ARTERIOGRAMS 2. COIL EMBOLIZATION OF GASTRODUODENAL ARTERY COMPARISON:  CT AP, 02/10/2023. MEDICATIONS: None ANESTHESIA/SEDATION: Local anesthetic was administered. The patient was continuously monitored during the procedure by the interventional radiology nurse under my direct supervision. CONTRAST:  90 mL Omnipaque 300 FLUOROSCOPY TIME:  Fluoroscopic dose; 304 mGy COMPLICATIONS: None immediate. PROCEDURE: Informed consent was obtained from the patient and/or patient's representative following explanation of the procedure, risks, benefits and alternatives. All questions were addressed. A time out was performed prior to the initiation of the procedure. Maximal barrier sterile technique utilized including caps, mask, sterile gowns,  sterile gloves, large sterile drape, hand hygiene, and chlorhexidine prep. The RIGHT femoral head was marked fluoroscopically. Under sterile conditions and local anesthesia, the RIGHT common femoral artery access was performed with a micropuncture needle. Under direct ultrasound guidance, the RIGHT common femoral was accessed with a micropuncture kit. An ultrasound image was saved for documentation purposes. This allowed for placement of a 5 Fr vascular sheath. A limited arteriogram was performed through the side arm of the sheath confirming appropriate access within the RIGHT common femoral artery. A limited abdominal aortogram was performed to help identify the celiac artery origin. Over a Bentson wire, a recurve (Chung B) catheter was advanced, back bled and flushed. The catheter was then utilized to select the celiac artery and a selective celiac arteriogram was performed. Using a 2.4 Fr Progreat microcatheter and 0.016 inch Fathom microwire access  into the gastroduodenal artery was performed and a selective arteriogram was performed. Selective embolization with multiple 0.018 inch Terumo micro coils was performed. A "fluoropak" packing coil was utilized to complete the embolization and was deployed, however the deployment mechanism was lodged within the common hepatic artery. An attempt at capture with a um loop snare was performed but unsuccessful. The microcatheter was therefore removed and arteriogram with the 5 Fr catheter at the common hepatic artery was performed. Adequate pruning of the GDA feeder arteries was achieved on post embolization arteriogram. Images were reviewed and the procedure was terminated. All wires, catheters and sheaths were removed from the patient. Hemostasis was achieved at the RIGHT groin access site with Angio-Seal closure device. The patient tolerated the procedure well without immediate post procedural complication. FINDINGS: *access via the RIGHT femoral artery. *Gastroduodenal arteriogram without angiographic evidence of active contrast extravasation. *Successful embolization with adequate pruning of the GDA distribution arteries. *Retained foreign body with the coil deployment string mechanism lodged within the [facility] *Angio-Seal closure at RIGHT groin. Palpable RLE pulses at the end of the Case IMPRESSION: 1. Successful coil embolization of the gastroduodenal artery for persistent upper GI bleed. 2. No angiographic evidence of active contrast extravasation. 3. Retained foreign body with the coil deployment string mechanism lodged within the [facility]. PLAN: - The patient is to remain flat for 2 hours with RIGHT leg straight. - The patient may continue to experience residual melena however should resolve in the coming days. Roanna Banning, MD [facility] Radiology Electronically Signed   By: Roanna Banning M.D.   On: 02/11/2023 18:47   IR Angiogram Selective Each Additional  Vessel Result Date: 02/11/2023 INDICATION: Upper GI bleed.  Duodenal ulcer. EXAM: Procedures: 1. MESENTERIC ARTERIOGRAPHY, INCLUDING CELIAC, COMMON HEPATIC AND GASTRODUODENAL ARTERIOGRAMS 2. COIL EMBOLIZATION OF GASTRODUODENAL ARTERY COMPARISON:  CT AP, 02/10/2023. MEDICATIONS: None ANESTHESIA/SEDATION: Local anesthetic was administered. The patient was continuously monitored during the procedure by the interventional radiology nurse under my direct supervision. CONTRAST:  90 mL Omnipaque 300 FLUOROSCOPY TIME:  Fluoroscopic dose; 304 mGy COMPLICATIONS: None immediate. PROCEDURE: Informed consent was obtained from the patient and/or patient's representative following explanation of the procedure, risks, benefits and alternatives. All questions were addressed. A time out was performed prior to the initiation of the procedure. Maximal barrier sterile technique utilized including caps, mask, sterile gowns, sterile gloves, large sterile drape, hand hygiene, and chlorhexidine prep. The RIGHT femoral head was marked fluoroscopically. Under sterile conditions and local anesthesia, the RIGHT common femoral artery access was performed with a micropuncture needle. Under direct ultrasound guidance, the RIGHT common femoral was accessed with a micropuncture  kit. An ultrasound image was saved for documentation purposes. This allowed for placement of a 5 Fr vascular sheath. A limited arteriogram was performed through the side arm of the sheath confirming appropriate access within the RIGHT common femoral artery. A limited abdominal aortogram was performed to help identify the celiac artery origin. Over a Bentson wire, a recurve (Chung B) catheter was advanced, back bled and flushed. The catheter was then utilized to select the celiac artery and a selective celiac arteriogram was performed. Using a 2.4 Fr Progreat microcatheter and 0.016 inch Fathom microwire access into the gastroduodenal artery was performed and a selective  arteriogram was performed. Selective embolization with multiple 0.018 inch Terumo micro coils was performed. A "fluoropak" packing coil was utilized to complete the embolization and was deployed, however the deployment mechanism was lodged within the common hepatic artery. An attempt at capture with a um loop snare was performed but unsuccessful. The microcatheter was therefore removed and arteriogram with the 5 Fr catheter at the common hepatic artery was performed. Adequate pruning of the GDA feeder arteries was achieved on post embolization arteriogram. Images were reviewed and the procedure was terminated. All wires, catheters and sheaths were removed from the patient. Hemostasis was achieved at the RIGHT groin access site with Angio-Seal closure device. The patient tolerated the procedure well without immediate post procedural complication. FINDINGS: *access via the RIGHT femoral artery. *Gastroduodenal arteriogram without angiographic evidence of active contrast extravasation. *Successful embolization with adequate pruning of the GDA distribution arteries. *Retained foreign body with the coil deployment string mechanism lodged within the [facility] *Angio-Seal closure at RIGHT groin. Palpable RLE pulses at the end of the Case IMPRESSION: 1. Successful coil embolization of the gastroduodenal artery for persistent upper GI bleed. 2. No angiographic evidence of active contrast extravasation. 3. Retained foreign body with the coil deployment string mechanism lodged within the [facility]. PLAN: - The patient is to remain flat for 2 hours with RIGHT leg straight. - The patient may continue to experience residual melena however should resolve in the coming days. Roanna Banning, MD [facility] Radiology Electronically Signed   By: Roanna Banning M.D.   On: 02/11/2023 18:47   CT ANGIO GI BLEED Addendum Date: 02/10/2023 ADDENDUM REPORT: 02/10/2023 16:14 ADDENDUM: Critical Value/emergent  results were called by telephone at the time of interpretation on 02/10/2023 at 4:13 pm to provider ERIC [place] , who verbally acknowledged these results. Electronically Signed   By: Lupita Raider M.D.   On: 02/10/2023 16:14   Result Date: 02/10/2023 CLINICAL DATA:  Upper gastrointestinal bleeding for 2 days. EXAM: CTA ABDOMEN AND PELVIS WITHOUT AND WITH CONTRAST TECHNIQUE: Multidetector CT imaging of the abdomen and pelvis was performed using the standard protocol during bolus administration of intravenous contrast. Multiplanar reconstructed images and MIPs were obtained and reviewed to evaluate the vascular anatomy. RADIATION DOSE REDUCTION: This exam was performed according to the departmental dose-optimization program which includes automated exposure control, adjustment of the mA and/or kV according to patient size and/or use of iterative reconstruction technique. CONTRAST:  OMNIPAQUE IOHEXOL 350 MG/ML SOLN COMPARISON:  July 19, 2014. FINDINGS: VASCULAR Aorta: Normal caliber aorta without aneurysm, dissection, vasculitis or significant stenosis. Celiac: Patent without evidence of aneurysm, dissection, vasculitis or significant stenosis. SMA: Patent without evidence of aneurysm, dissection, vasculitis or significant stenosis. Renals: Both renal arteries are patent without evidence of aneurysm, dissection, vasculitis, fibromuscular dysplasia or significant stenosis. IMA: Patent without evidence of aneurysm, dissection, vasculitis or significant  stenosis. Inflow: Patent without evidence of aneurysm, dissection, vasculitis or significant stenosis. Proximal Outflow: Bilateral common femoral and visualized portions of the superficial and profunda femoral arteries are patent without evidence of aneurysm, dissection, vasculitis or significant stenosis. Veins: No obvious venous abnormality within the limitations of this arterial phase study. Review of the MIP images confirms the above findings. NON-VASCULAR  Lower chest: No acute abnormality. Hepatobiliary: Multiple hepatic cysts are again noted. Status post cholecystectomy. No biliary dilatation is noted. Pancreas: Unremarkable. No pancreatic ductal dilatation or surrounding inflammatory changes. Spleen: Normal in size without focal abnormality. Adrenals/Urinary Tract: Adrenal glands are unremarkable. Kidneys are normal, without renal calculi, focal lesion, or hydronephrosis. Bladder is unremarkable. Stomach/Bowel: There is no evidence of bowel obstruction. There is no clear extravasation of contrast seen on this exam, although a small high density focus is seen anterior to the gastroduodenal branch of the celiac artery posterior to the stomach, best seen on image number 75 of series 6. This potentially may represent a small branch, but small focus of extravasation cannot be excluded, although it is not clearly visualized on more delayed imaging. Mild inflammatory changes are seen involving the distal stomach and proximal duodenum with mildly enlarged lymph nodes. The appendix is unremarkable. Lymphatic: No adenopathy is noted. Reproductive: Status post hysterectomy. No adnexal masses. Other: Findings consistent with prior ventral hernia repair in the pelvis. No ascites. Musculoskeletal: Status post right hip arthroplasty. No acute osseous abnormality seen. IMPRESSION: VASCULAR As noted above, there is a very small high density focus seen anterior to the gastroduodenal branch of the celiac artery which is posterior to the stomach. This may simply represent a very small branch of the artery, but small focus of extravasation cannot be excluded, although it is not clearly visualized on more delayed imaging of this area. These results will be called to the ordering clinician or representative by the Radiologist Assistant, and communication documented in the PACS or zVision Dashboard. NON-VASCULAR Mild inflammatory changes are seen involving the distal stomach and proximal  duodenum with mildly enlarged lymph nodes, suggesting peptic ulcer disease. Electronically Signed: By: Lupita Raider M.D. On: 02/10/2023 16:05   Microbiology: Results for orders placed or performed during the hospital encounter of 02/10/23  MRSA Next Gen by PCR, Nasal     Status: None   Collection Time: 02/10/23  6:00 PM   Specimen: Nasal Mucosa; Nasal Swab  Result Value Ref Range Status   MRSA by PCR Next Gen NOT DETECTED NOT DETECTED Final    Comment: (NOTE) The GeneXpert MRSA Assay (FDA approved for NASAL specimens only), is one component of a comprehensive MRSA colonization surveillance program. It is not intended to diagnose MRSA infection nor to guide or monitor treatment for MRSA infections. Test performance is not FDA approved in patients less than 50 years old. Performed at [facility], [address] [place], [place] [address]    Labs: CBC: Recent Labs  Lab 02/10/23 1100 02/10/23 1652 02/11/23 0334 02/11/23 1200 02/11/23 1412 02/11/23 1839 02/12/23 1612 02/12/23 2056 02/13/23 0240 02/13/23 1527 02/14/23 0233  WBC 9.4  --  8.1  --  8.8  --   --   --  7.3 9.1 7.8  NEUTROABS 7.9*  --   --   --   --   --   --   --  4.1 5.9 4.9  HGB 7.6*   < > 8.7*   < > 5.0*   < > 10.8* 10.4* 9.5* 10.1* 10.1*  HCT  24.5*   < > 26.2*   < > 15.7*   < > 31.2* 29.9* 27.9* 30.9* 30.0*  MCV 100.0  --  88.8  --  91.3  --   --   --  88.0 89.3 90.6  PLT 314  --  284  --  264  --   --   --  180 185 197   < > = values in this interval not displayed.   Basic Metabolic Panel: Recent Labs  Lab 02/10/23 1100 02/11/23 0334 02/12/23 0212 02/13/23 0240 02/14/23 0233  NA 136 136 142 142 141  K 5.3* 4.1 4.3 3.4* 3.8  CL 104 106 114* 113* 111  CO2 21* 23 19* 19* 21*  GLUCOSE 103* 90 93 94 95  BUN 58* 48* 44* 31* 18  CREATININE 1.17* 0.87 0.87 0.93 0.76  CALCIUM 8.5* 8.2* 7.7* 8.1* 8.5*  MG  --   --   --  1.7 1.5*  PHOS  --   --   --  2.4* 2.8   Liver Function Tests: Recent Labs  Lab  02/10/23 1100 02/13/23 0240 02/14/23 0233  AST 22 17 16   ALT 10 14 17   ALKPHOS 39 30* 34*  BILITOT 0.9 0.7 0.6  PROT 6.1* 4.8* 4.8*  ALBUMIN 2.9* 2.8* 2.8*   CBG: Recent Labs  Lab 02/12/23 2039 02/13/23 0746 02/13/23 1125 02/13/23 1529 02/14/23 0738  GLUCAP 75 92 87 78 94   Discharge time spent: greater than 30 minutes.  Signed: Marguerita Merles, DO [facility] 02/15/2023

## 2023-02-16 ENCOUNTER — Encounter (INDEPENDENT_AMBULATORY_CARE_PROVIDER_SITE_OTHER): Payer: Self-pay | Admitting: Gastroenterology

## 2023-02-16 ENCOUNTER — Ambulatory Visit (INDEPENDENT_AMBULATORY_CARE_PROVIDER_SITE_OTHER): Payer: Medicare Other | Admitting: Gastroenterology

## 2023-02-16 ENCOUNTER — Other Ambulatory Visit (INDEPENDENT_AMBULATORY_CARE_PROVIDER_SITE_OTHER): Payer: Self-pay | Admitting: *Deleted

## 2023-02-16 VITALS — BP 136/64 | HR 81 | Temp 97.5°F | Ht 60.0 in | Wt 168.9 lb

## 2023-02-16 DIAGNOSIS — E538 Deficiency of other specified B group vitamins: Secondary | ICD-10-CM | POA: Diagnosis not present

## 2023-02-16 DIAGNOSIS — K269 Duodenal ulcer, unspecified as acute or chronic, without hemorrhage or perforation: Secondary | ICD-10-CM | POA: Diagnosis not present

## 2023-02-16 DIAGNOSIS — Z1211 Encounter for screening for malignant neoplasm of colon: Secondary | ICD-10-CM | POA: Insufficient documentation

## 2023-02-16 DIAGNOSIS — K264 Chronic or unspecified duodenal ulcer with hemorrhage: Secondary | ICD-10-CM | POA: Insufficient documentation

## 2023-02-16 MED ORDER — PANTOPRAZOLE SODIUM 40 MG PO TBEC: 40.0000 mg | DELAYED_RELEASE_TABLET | Freq: Two times a day (BID) | ORAL | 2 refills | Status: DC

## 2023-02-16 MED FILL — Fentanyl Citrate Preservative Free (PF) Inj 100 MCG/2ML: INTRAMUSCULAR | Qty: 2 | Status: AC

## 2023-02-16 NOTE — Patient Instructions (Signed)
It was very nice to meet you today, as dicussed with will plan for the following :  1) Pantoprazole 40mg , 30 min before meals twice daily for 2 months atleast Caralfate  2) Repeat Upper endoscopy and colonosocpy in 8 weeks  3) as discussed if you feel weak or shortness of breath or black-colored stools go to the ER

## 2023-02-16 NOTE — Progress Notes (Signed)
Sarah Walker , M.D. Gastroenterology & Hepatology [facility] Gastroenterology [address] [place], [place] [address] Primary Care Physician: Assunta Found, MD [address] [place] [address]  Chief Complaint:  recent massive GI bleed with Duodenal ulcer   History of Present Illness:  Sarah Walker is a 73 year old female with history of ovarian cancer status post complete hysterectomy and chemo, psoriasis, hypertension, who is here for a follow up after being discharged from the hospital with massive upper GI bleed due to duodenal ulcer status post EGD and embolization .  Patient was discharged 2 days ago from [facility] (02/14/2023) .  Since being discharged patient is feeling better but continues to be weak.  Although fortunately she had 2 bowel movements in the past 2 days and both bowel movements were brown in color denies any melena or hematochezia. Patient does report taking meloxicam for many years and aspirin 81 mg.  Patient has not picked up PPI and have not had any for the last 2 days.  Tolerating regular diet without any abdominal pain  History of massive UGIB   Patient came to the hospital after presenting recurrent episodes of melena in the setting of meloxicam use.  Patient was found to have significant drop in her hemoglobin down to 7.6.  Underwent EGD on 02/10/2023 Which showed presence of large amount of clots in the stomach, with presence of a nonobstructing cratered duodenal ulcer with adherent clot the duodenal bulb measuring, ulcer measured close to 20 mm.  This was injected with epinephrine.  CT angio bleed performed on the same day showed presence of small less than esophagogastroduodenospy focus seen anterior to the gastroduodenal branch of the celiac artery, along with inflammatory changes in the distal stomach and proximal duodenum.  Due to this, the patient underwent cold embolization of the gastroduodenal artery .  Following  this patient had further episodes of hematochezia with drop in hemoglobin and was transferred back to [facility], ICU where repeat CTA did not demonstrate any extravasation and patient was at that time treated conservatively without any further bleeding and hence was discharged.  Patient required total of 7 units PRBC and 1 unit FFP  Unfortunately, after embolization was performed, the patient had recurrent episodes of hematochezia with further drop in her hemoglobin and soft blood pressures.   The patient denies having any nausea, vomiting, fever, chills, hematochezia, melena, hematemesis, abdominal distention, abdominal pain, diarrhea, jaundice, pruritus or weight loss.  Last EGD:02/2024 - Normal esophagus. - Large amount of Clotted blood in the entire stomach, unable to be suctioned hence inadequate exam - Non- obstructing large duodenal ulcer with large adherent clot; unroofing was not attempted. Perforation of the bowel wall cannot be ruled out. Injected. This is either large peptic ulcer vs mass vs walled off perforation - No specimens collected.  Last Colonoscopy:10 years ago as per patient   Last labs from 02/14/2023 with hemoglobin 10.1 platelet 197 Interestingly her alk phos is very low 34 AST ALT normal Ferritin 74 Past Medical History: Past Medical History:  Diagnosis Date   Abdominal hernia    Allergic rhinitis    Arthritis    Bone spur of other site    left foot   Dry eyes    Family history of kidney cancer    Family history of prostate cancer    Hypertension    Ovarian cancer (HCC)    Ovarian cancer on right (HCC) 06/17/2011   Peripheral neuropathy 08/26/2011  Chemotherapy-induced   Pulmonary embolism (HCC)    Sinusitis     Past Surgical History: Past Surgical History:  Procedure Laterality Date   ABDOMINAL HYSTERECTOMY  05/25/2011   [place] bs&o and cancer staging   CHOLECYSTECTOMY  01/05/1988   COLONOSCOPY N/A 09/06/2014   Procedure: COLONOSCOPY;  Surgeon: Malissa Hippo, MD;  [facility]: [facility];  Service: Endoscopy;  Laterality: N/A;  930 - moved to 9/2 @ 8:25   ESOPHAGOGASTRODUODENOSCOPY (EGD) WITH PROPOFOL N/A 02/10/2023   Procedure: ESOPHAGOGASTRODUODENOSCOPY (EGD) WITH PROPOFOL;  Surgeon: Franky Macho, MD;  Location: [facility];  Service: Endoscopy;  Laterality: N/A;   I & D EXTREMITY Left 12/13/2021   Procedure: IRRIGATION AND DEBRIDEMENT ANKLE/ LEG;  Surgeon: Sheral Apley, MD;  Location: [facility] OR;  Service: Orthopedics;  Laterality: Left;   INCISIONAL HERNIA REPAIR  10/15/2014   INCISIONAL HERNIA REPAIR  10/15/2014   Procedure: OPEN INCISIONAL HERNIA REPAIR WITH MESH AND MYOFASCIAL FLAPS;  Surgeon: Harriette Bouillon, MD;  Location: [facility] OR;  Service: General;;   IR ANGIOGRAM SELECTIVE EACH ADDITIONAL VESSEL  02/11/2023   IR ANGIOGRAM VISCERAL SELECTIVE  02/11/2023   IR EMBO ART  VEN HEMORR LYMPH EXTRAV  INC GUIDE ROADMAPPING  02/11/2023   IR US GUIDE VASC ACCESS RIGHT  02/11/2023   IVC filter  05/24/2011   PORT-A-CATH REMOVAL Right 11/21/2013   Procedure: MINOR REMOVAL PORT-A-CATH;  Surgeon: Dalia Heading, MD;  Location: [facility] ORS;  Service: General;  Laterality: Right;   PORTACATH PLACEMENT  06/16/2011   Done at [facility]   ROTATOR CUFF REPAIR Right    SCLEROTHERAPY  02/10/2023   Procedure: Susa Day;  Surgeon: Franky Macho, MD;  Location: [facility];  Service: Endoscopy;;   TONSILLECTOMY     age 62's   TOTAL HIP ARTHROPLASTY Right 08/26/2020   Procedure: TOTAL HIP ARTHROPLASTY ANTERIOR APPROACH;  Surgeon: Sheral Apley, MD;  Location: [facility] ORS;  Service: Orthopedics;  Laterality: Right;   TOTAL KNEE ARTHROPLASTY Left 07/20/2022   Procedure: TOTAL KNEE ARTHROPLASTY;  Surgeon: Sheral Apley, MD;  Location: [facility] ORS;  Service: Orthopedics;  Laterality: Left;   TUBAL LIGATION  01/05/1979    Family History: Family History  Problem Relation Age of Onset   Stroke Mother    Cervical cancer Mother 68   Kidney cancer Father  81   Aneurysm Paternal Grandmother        92s   Hodgkin's lymphoma Maternal Uncle 33   Prostate cancer Paternal Uncle    Throat cancer Paternal Uncle    Cancer Cousin        2 female maternal first cousins with unknown cancer   Colon cancer Neg Hx    Stomach cancer Neg Hx     Social History: Social History   Tobacco Use  Smoking Status Never  Smokeless Tobacco Never   Social History   Substance and Sexual Activity  Alcohol Use No   Social History   Substance and Sexual Activity  Drug Use No    Allergies: Allergies  Allergen Reactions   Erythromycin Itching   Codeine Nausea And Vomiting    Not right away after a couple days of taking    Medications: Current Outpatient Medications  Medication Sig Dispense Refill   acetaminophen (TYLENOL) 325 MG tablet Take 2 tablets (650 mg total) by mouth every 6 (six) hours as needed for mild pain (pain score 1-3) (or Fever >/= 101). 20 tablet 0   ALPRAZolam (XANAX) 0.5  MG tablet Take 0.5 mg by mouth at bedtime.     azelastine (OPTIVAR) 0.05 % ophthalmic solution Place 1 drop into both eyes daily as needed (rag weed).  11   fexofenadine (ALLEGRA) 180 MG tablet Take 180 mg by mouth daily as needed for allergies or rhinitis.     fluticasone (FLONASE) 50 MCG/ACT nasal spray Place 1 spray into both nostrils daily as needed for allergies or rhinitis.     HYDROcodone-acetaminophen (NORCO/VICODIN) 5-325 MG tablet Take 1-2 tablets by mouth every 6 (six) hours as needed for moderate pain (pain score 4-6).     KLOR-CON M20 20 MEQ tablet TAKE 1 TABLET BY MOUTH EVERY DAY (Patient taking differently: Take 10 mEq by mouth daily.) 90 tablet 1   leptospermum manuka honey (MEDIHONEY) PSTE paste Apply 1 Application topically daily. 30 mL 0   ondansetron (ZOFRAN) 4 MG tablet Take 1 tablet (4 mg total) by mouth every 6 (six) hours as needed for nausea. 20 tablet 0   sucralfate (CARAFATE) 1 GM/10ML suspension Take 10 mLs (1 g total) by mouth 4 (four) times  daily -  with meals and at bedtime. 420 mL 0   valsartan-hydrochlorothiazide (DIOVAN-HCT) 320-12.5 MG tablet Take 1 tablet by mouth daily.     vitamin B-12 100 MCG tablet Take 1 tablet (100 mcg total) by mouth daily. 30 tablet 0   [Paused] aspirin EC 81 MG tablet Take 81 mg by mouth daily. Swallow whole. (Patient not taking: Reported on 02/16/2023)     pantoprazole (PROTONIX) 40 MG tablet Take 1 tablet (40 mg total) by mouth 2 (two) times daily. (Patient not taking: Reported on 02/16/2023) 60 tablet 0   sennosides-docusate sodium (SENOKOT-S) 8.6-50 MG tablet Take 2 tablets by mouth daily. (Patient not taking: Reported on 02/16/2023) 30 tablet 1   No current facility-administered medications for this visit.   Facility-Administered Medications Ordered in Other Visits  Medication Dose Route Frequency Provider Last Rate Last Admin   sodium chloride 0.9 % injection 10 mL  10 mL Intravenous PRN Ellouise Newer, PA-C   10 mL at 08/31/12 1446   sodium chloride 0.9 % injection 10 mL  10 mL Intravenous PRN Alla German, MD   10 mL at 09/12/13 1408    Review of Systems: GENERAL: negative for malaise, night sweats HEENT: No changes in hearing or vision, no nose bleeds or other nasal problems. NECK: Negative for lumps, goiter, pain and significant neck swelling RESPIRATORY: Negative for cough, wheezing CARDIOVASCULAR: Negative for chest pain, leg swelling, palpitations, orthopnea GI: SEE HPI MUSCULOSKELETAL: Negative for joint pain or swelling, back pain, and muscle pain. SKIN: Negative for lesions, rash HEMATOLOGY Negative for prolonged bleeding, bruising easily, and swollen nodes. ENDOCRINE: Negative for cold or heat intolerance, polyuria, polydipsia and goiter. NEURO: negative for tremor, gait imbalance, syncope and seizures. The remainder of the review of systems is noncontributory.   Physical Exam: There were no vitals taken for this visit. GENERAL: The patient is AO x3, in no acute  distress. HEENT: Head is normocephalic and atraumatic. EOMI are intact. Mouth is well hydrated and without lesions. NECK: Supple. No masses LUNGS: Clear to auscultation. No presence of rhonchi/wheezing/rales. Adequate chest expansion HEART: RRR, normal s1 and s2. ABDOMEN: Soft, nontender, no guarding, no peritoneal signs, and nondistended. BS +. No masses.  Imaging/Labs: as above     Latest Ref Rng & Units 02/14/2023    2:33 AM 02/13/2023    3:27 PM 02/13/2023    2:40 AM  CBC  WBC 4.0 - 10.5 K/uL 7.8  9.1  7.3   Hemoglobin 12.0 - 15.0 g/dL 40.9  81.1  9.5   Hematocrit 36.0 - 46.0 % 30.0  30.9  27.9   Platelets 150 - 400 K/uL 197  185  180    Lab Results  Component Value Date   IRON 86 02/10/2023   TIBC 249 (L) 02/10/2023   FERRITIN 74 02/10/2023    I personally reviewed and interpreted the available labs, imaging and endoscopic files.  Impression and Plan:  Sarah Walker is a 73 year old female with history of ovarian cancer status post complete hysterectomy and chemo, psoriasis, hypertension, who is here for a follow up after being discharged from the hospital with massive upper GI bleed due to duodenal ulcer status post EGD and embolization .  #Duodenal ulcer complicated with recent hospitalization with massive upper GI bleed  Patient was discharged 2 days ago from [facility] (02/14/2023) .  Since being discharged patient is feeling better but continues to be weak.  Although fortunately she had 2 bowel movements in the past 2 days and both bowel movements were brown in color denies any melena or hematochezia, hence unlikely actively bleeding at this time   Likely cause of peptic ulcer disease meloxicam and aspirin but need to evaluate for H. pylori   Patient has not picked up PPI and have not had any for the last 2 days.  Tolerating regular diet without any abdominal pain  Recs:  -Was given a dose of vonoprazan in the office today as we did not have PPI, and patient havent  taken PPI for 2 days -Patient will pick up pantoprazole today from pharmacy -Extensively counseled on taking PPI 40 mg 30 minutes before meals for 8 weeks -Repeat CBC CMP mag and Phos -Sucralfate -Repeat upper endoscopy in 8 weeks to assess healing of the ulcer and to complete the exam as last EGD was with significant blood and was not an adequate exam.  Will take biopsy at the same time to evaluate for H. pylori -Patient extensively counseled and given ED precautions if any shortness of breath worsening fatigue or black-colored stools or blood in stool to go to the ER -Will check stool H. pylori antigen, we have new testing which apparently does not get affected by PPI use, kit given  -Avoid using high dose aspirin including Goody/BC powders, NSAIDs such as Aleve, ibuprofen, naproxen, Motrin, Voltaren or Advil (even the topical ones)  #Vitamin B12 deficiency  Obtain pernicious anemia labs , with anti-intrinsic factor antibody antiparietal antibody  #Colon cancer screening  The patient was counseled regarding the importance of colorectal cancer screening,  The benefits of screening include early detection of colorectal cancer and precancerous polyps, which can improve treatment outcomes and reduce mortality. Risks associated with screening, particularly colonoscopy, include potential complications such as bleeding and perforation. After deciding different modalities for screening for colon cancer , patient has opted to pursue Colonoscopy , will perform with EGD in 8 weeks   All questions were answered.      Sarah Lawman, MD Gastroenterology and Hepatology [facility] Gastroenterology   This chart has been completed using [employer] Dictation software, and while attempts have been made to ensure accuracy , certain words and phrases may not be transcribed as intended

## 2023-02-17 DIAGNOSIS — K270 Acute peptic ulcer, site unspecified, with hemorrhage: Secondary | ICD-10-CM | POA: Diagnosis not present

## 2023-02-17 LAB — BPAM FFP
Blood Product Expiration Date: 202502090045
Blood Product Expiration Date: 202502122359
ISSUE DATE / TIME: 202502080138
ISSUE DATE / TIME: 202502080220
Unit Type and Rh: 6200
Unit Type and Rh: 6200

## 2023-02-17 LAB — PREPARE FRESH FROZEN PLASMA: Unit division: 0

## 2023-02-18 DIAGNOSIS — E538 Deficiency of other specified B group vitamins: Secondary | ICD-10-CM | POA: Diagnosis not present

## 2023-02-18 DIAGNOSIS — K264 Chronic or unspecified duodenal ulcer with hemorrhage: Secondary | ICD-10-CM | POA: Diagnosis not present

## 2023-02-18 DIAGNOSIS — K269 Duodenal ulcer, unspecified as acute or chronic, without hemorrhage or perforation: Secondary | ICD-10-CM | POA: Diagnosis not present

## 2023-02-21 ENCOUNTER — Telehealth (INDEPENDENT_AMBULATORY_CARE_PROVIDER_SITE_OTHER): Payer: Self-pay | Admitting: Gastroenterology

## 2023-02-21 LAB — COMPREHENSIVE METABOLIC PANEL
ALT: 8 [IU]/L (ref 0–32)
AST: 10 [IU]/L (ref 0–40)
Albumin: 3.6 g/dL — ABNORMAL LOW (ref 3.8–4.8)
Alkaline Phosphatase: 60 [IU]/L (ref 44–121)
BUN/Creatinine Ratio: 9 — ABNORMAL LOW (ref 12–28)
BUN: 11 mg/dL (ref 8–27)
Bilirubin Total: 0.3 mg/dL (ref 0.0–1.2)
CO2: 26 mmol/L (ref 20–29)
Calcium: 8.6 mg/dL — ABNORMAL LOW (ref 8.7–10.3)
Chloride: 105 mmol/L (ref 96–106)
Creatinine, Ser: 1.17 mg/dL — ABNORMAL HIGH (ref 0.57–1.00)
Globulin, Total: 2.1 g/dL (ref 1.5–4.5)
Glucose: 86 mg/dL (ref 70–99)
Potassium: 4.3 mmol/L (ref 3.5–5.2)
Sodium: 143 mmol/L (ref 134–144)
Total Protein: 5.7 g/dL — ABNORMAL LOW (ref 6.0–8.5)
eGFR: 49 mL/min/{1.73_m2} — ABNORMAL LOW (ref >59)

## 2023-02-21 LAB — CBC WITH DIFFERENTIAL/PLATELET
Basophils Absolute: 0 10*3/uL (ref 0.0–0.2)
Basos: 0 %
EOS (ABSOLUTE): 0.3 10*3/uL (ref 0.0–0.4)
Eos: 4 %
Hematocrit: 36.9 % (ref 34.0–46.6)
Hemoglobin: 11.7 g/dL (ref 11.1–15.9)
Immature Grans (Abs): 0 10*3/uL (ref 0.0–0.1)
Immature Granulocytes: 0 %
Lymphocytes Absolute: 1.2 10*3/uL (ref 0.7–3.1)
Lymphs: 16 %
MCH: 29.5 pg (ref 26.6–33.0)
MCHC: 31.7 g/dL (ref 31.5–35.7)
MCV: 93 fL (ref 79–97)
Monocytes Absolute: 0.7 10*3/uL (ref 0.1–0.9)
Monocytes: 9 %
Neutrophils Absolute: 4.9 10*3/uL (ref 1.4–7.0)
Neutrophils: 71 %
Platelets: 244 10*3/uL (ref 150–450)
RBC: 3.97 x10E6/uL (ref 3.77–5.28)
RDW: 14.1 % (ref 11.7–15.4)
WBC: 7.1 10*3/uL (ref 3.4–10.8)

## 2023-02-21 LAB — MAGNESIUM: Magnesium: 1.8 mg/dL (ref 1.6–2.3)

## 2023-02-21 LAB — PHOSPHORUS: Phosphorus: 3.6 mg/dL (ref 3.0–4.3)

## 2023-02-21 LAB — INTRINSIC FACTOR ANTIBODIES: Intrinsic Factor Abs, Serum: 1 [AU]/ml (ref 0.0–1.1)

## 2023-02-21 LAB — ANTI-PARIETAL ANTIBODY: Parietal Cell Ab: 1 U (ref 0.0–20.0)

## 2023-02-21 NOTE — Telephone Encounter (Signed)
Stool Hpylori test negative

## 2023-02-21 NOTE — Progress Notes (Signed)
Hi Sarah Walker ,  Can you please call the patient and tell the patient the lab work shows improving blood count which is a good news  Please continue to take pantoprazole twice daily 40 mg 30 minutes before breakfast for 8 weeks and then make it daily after that  Please continue follow-up with [place] in the GI clinic  Thanks,  [name], MD Gastroenterology and Hepatology [facility] Gastroenterology

## 2023-02-22 DIAGNOSIS — M79642 Pain in left hand: Secondary | ICD-10-CM | POA: Diagnosis not present

## 2023-02-22 DIAGNOSIS — E569 Vitamin deficiency, unspecified: Secondary | ICD-10-CM | POA: Diagnosis not present

## 2023-02-22 DIAGNOSIS — L409 Psoriasis, unspecified: Secondary | ICD-10-CM | POA: Diagnosis not present

## 2023-02-22 DIAGNOSIS — L4050 Arthropathic psoriasis, unspecified: Secondary | ICD-10-CM | POA: Diagnosis not present

## 2023-02-22 DIAGNOSIS — R0602 Shortness of breath: Secondary | ICD-10-CM | POA: Diagnosis not present

## 2023-02-22 DIAGNOSIS — L8940 Pressure ulcer of contiguous site of back, buttock and hip, unspecified stage: Secondary | ICD-10-CM | POA: Diagnosis not present

## 2023-02-22 DIAGNOSIS — R1013 Epigastric pain: Secondary | ICD-10-CM | POA: Diagnosis not present

## 2023-02-22 DIAGNOSIS — R5383 Other fatigue: Secondary | ICD-10-CM | POA: Diagnosis not present

## 2023-02-22 DIAGNOSIS — M79641 Pain in right hand: Secondary | ICD-10-CM | POA: Diagnosis not present

## 2023-02-22 DIAGNOSIS — R531 Weakness: Secondary | ICD-10-CM | POA: Diagnosis not present

## 2023-02-25 ENCOUNTER — Telehealth (INDEPENDENT_AMBULATORY_CARE_PROVIDER_SITE_OTHER): Payer: Self-pay | Admitting: *Deleted

## 2023-02-25 ENCOUNTER — Other Ambulatory Visit (INDEPENDENT_AMBULATORY_CARE_PROVIDER_SITE_OTHER): Payer: Self-pay | Admitting: *Deleted

## 2023-02-25 NOTE — Telephone Encounter (Signed)
Patient wanted to lt you know she saw Rheumatologist for psoriatic arthritis and was prescribed rasuvo and folic acid. She has not started yet and wanted to make sure you were ok with her taking before starting.   She also asked about if she could take baclofen and robaxin and I told her she should  call pcp as they were both muscle relaxers.

## 2023-03-01 NOTE — Telephone Encounter (Signed)
 Discussed with patient per Dr.Ahmed -  There is no absolute GI contraindication to start Rasuvo with folic acid given patient has psoriatic arthritis and rheumatologist recommended it  . Although please advice her to continue taking PPI twice daily and DO NOT use high dose aspirin includi ng Goody/BC powders, NSAIDs such as Aleve, ibuprofen, naproxen, Motrin, Voltaren or Advil (even the topical ones)  Patient verbalized understanding.

## 2023-03-07 ENCOUNTER — Telehealth (INDEPENDENT_AMBULATORY_CARE_PROVIDER_SITE_OTHER): Payer: Self-pay | Admitting: *Deleted

## 2023-03-07 NOTE — Telephone Encounter (Signed)
 Patient states her instructions from hospital said to take 2 - 325mg  every 6 hours. She usually takes extra strength tylenol 500mg  2 at night to total 1000mg  at night and also wanted to know if safe to take hydrocodone 5/325mg  at night. Instead of tylenol if pain is worse. Wanted to take this arthritis pain.   Also asking for the stool test results.  ( It is under labs scanned in report )   [phone]

## 2023-03-08 NOTE — Telephone Encounter (Signed)
 Hi Yes tylenol and oxycodone are not NSAID and are ok to take and can be taken with the doses given by the prescribing provider .  Also if you can inform her stool studies did not show Hpylori bacteria hence the ulcer was from NSAIDs

## 2023-03-08 NOTE — Telephone Encounter (Signed)
 Discussed with patient per Dr. Tasia Catchings -Yes tylenol and oxycodone are not NSAID and are ok to take and can be taken with the doses given by the prescribing provider . so if you can inform her stool studies did not show Hpylori bacteria hence the ulcer was from NSAIDs   Patient verbalized understanding

## 2023-03-10 ENCOUNTER — Telehealth (INDEPENDENT_AMBULATORY_CARE_PROVIDER_SITE_OTHER): Payer: Self-pay | Admitting: *Deleted

## 2023-03-10 ENCOUNTER — Telehealth (INDEPENDENT_AMBULATORY_CARE_PROVIDER_SITE_OTHER): Payer: Self-pay | Admitting: Gastroenterology

## 2023-03-10 DIAGNOSIS — Z1211 Encounter for screening for malignant neoplasm of colon: Secondary | ICD-10-CM

## 2023-03-10 DIAGNOSIS — K269 Duodenal ulcer, unspecified as acute or chronic, without hemorrhage or perforation: Secondary | ICD-10-CM

## 2023-03-10 MED ORDER — SUTAB 1479-225-188 MG PO TABS: ORAL_TABLET | ORAL | 0 refills | Status: DC

## 2023-03-10 NOTE — Telephone Encounter (Signed)
 Pt.notified

## 2023-03-10 NOTE — Telephone Encounter (Signed)
 Contacted pt and scheduled her to TCS/EGD. Scheduled for 04/14/23. Pt requested tablets. Will send to pharmacy. Will mail instructions. No PA needed per insurance.

## 2023-03-10 NOTE — Telephone Encounter (Signed)
 Pt got rx for sucralfate from dr Marland Mcalpine at hospital. She does not have refills and wants to know if she needs to continue med then will need rx sent to pharmacy or if she does not need to take med any longer.   [phone]

## 2023-03-17 DIAGNOSIS — L4050 Arthropathic psoriasis, unspecified: Secondary | ICD-10-CM | POA: Diagnosis not present

## 2023-03-18 ENCOUNTER — Other Ambulatory Visit (INDEPENDENT_AMBULATORY_CARE_PROVIDER_SITE_OTHER): Payer: Self-pay | Admitting: Gastroenterology

## 2023-03-18 DIAGNOSIS — K264 Chronic or unspecified duodenal ulcer with hemorrhage: Secondary | ICD-10-CM

## 2023-03-18 DIAGNOSIS — K269 Duodenal ulcer, unspecified as acute or chronic, without hemorrhage or perforation: Secondary | ICD-10-CM

## 2023-03-29 DIAGNOSIS — J3089 Other allergic rhinitis: Secondary | ICD-10-CM | POA: Diagnosis not present

## 2023-03-30 ENCOUNTER — Ambulatory Visit (INDEPENDENT_AMBULATORY_CARE_PROVIDER_SITE_OTHER): Payer: Medicare Other | Admitting: Gastroenterology

## 2023-03-31 ENCOUNTER — Encounter: Payer: Self-pay | Admitting: Podiatry

## 2023-03-31 ENCOUNTER — Ambulatory Visit (INDEPENDENT_AMBULATORY_CARE_PROVIDER_SITE_OTHER): Admitting: Podiatry

## 2023-03-31 DIAGNOSIS — B351 Tinea unguium: Secondary | ICD-10-CM

## 2023-03-31 DIAGNOSIS — M79674 Pain in right toe(s): Secondary | ICD-10-CM

## 2023-03-31 DIAGNOSIS — M79675 Pain in left toe(s): Secondary | ICD-10-CM

## 2023-03-31 DIAGNOSIS — M722 Plantar fascial fibromatosis: Secondary | ICD-10-CM | POA: Diagnosis not present

## 2023-03-31 MED ORDER — TRIAMCINOLONE ACETONIDE 10 MG/ML IJ SUSP
10.0000 mg | Freq: Once | INTRAMUSCULAR | Status: AC
  Administered 2023-03-31: 10 mg via INTRA_ARTICULAR

## 2023-03-31 NOTE — Progress Notes (Signed)
 Subjective:   Patient ID: Sarah Walker, female   DOB: 73 y.o.   MRN: [id number]   HPI Patient presents stating that the heels have been really bothering her on both feet and her nails of elongated are thick and she cannot cut   ROS      Objective:  Physical Exam  Neurovascular status intact with the patient found to have patient of the plantar aspect of both feet at the insertional point tendon calcaneus with fluid buildup and is noted to have thick yellow brittle nailbeds 1-5 both feet that she cannot cut and they are tender     Assessment:  Acute plantar fasciitis bilateral mycotic toenail infections 1-5 bilateral with pain     Plan:  H&P reviewed went ahead today sterile prep injected the plantar fascia bilateral 3 mg Kenalog 5 mg Xylocaine and then went ahead and debrided nailbeds 1-5 both feet no iatrogenic bleeding reappoint routine care

## 2023-04-01 ENCOUNTER — Telehealth (INDEPENDENT_AMBULATORY_CARE_PROVIDER_SITE_OTHER): Payer: Self-pay | Admitting: *Deleted

## 2023-04-01 NOTE — Telephone Encounter (Signed)
 Fax from [employer] asking for medical records that pertain to the ordered test helicobacter pylori panel. I faxed office visit note from 02/16/23.  Fax number [phone]

## 2023-04-06 ENCOUNTER — Encounter (INDEPENDENT_AMBULATORY_CARE_PROVIDER_SITE_OTHER): Payer: Self-pay | Admitting: Gastroenterology

## 2023-04-06 ENCOUNTER — Ambulatory Visit (INDEPENDENT_AMBULATORY_CARE_PROVIDER_SITE_OTHER): Admitting: Gastroenterology

## 2023-04-06 VITALS — BP 129/75 | HR 69 | Temp 97.5°F | Ht 60.0 in | Wt 158.7 lb

## 2023-04-06 DIAGNOSIS — K269 Duodenal ulcer, unspecified as acute or chronic, without hemorrhage or perforation: Secondary | ICD-10-CM | POA: Diagnosis not present

## 2023-04-06 DIAGNOSIS — E538 Deficiency of other specified B group vitamins: Secondary | ICD-10-CM

## 2023-04-06 DIAGNOSIS — Z1211 Encounter for screening for malignant neoplasm of colon: Secondary | ICD-10-CM

## 2023-04-06 NOTE — H&P (View-Only) (Signed)
 [name] , M.D. Gastroenterology & Hepatology [facility] [address] [place], [place] [address] Primary Care Physician: Assunta Found, MD [address] [place] [address]  Chief Complaint:  PUD, Screening colonoscopy   History of Present Illness:  Sarah Walker is a 73 year old female with history of ovarian cancer status post complete hysterectomy and chemo, psoriasis, hypertension, who is here for a follow up with history of  massive upper GI bleed due to duodenal ulcer status post EGD and embolization and colon cancer screening .  Patient reports that her stools are usually brown no black tarry stools.  Has 1-2 bowel movement daily patient does report taking meloxicam for many years and aspirin 81 mg which she has stopped since hospitalization .  he patient denies having any nausea, vomiting, fever, chills, hematochezia, melena, hematemesis, abdominal distention, abdominal pain, diarrhea, jaundice, pruritus or weight loss.  History of massive UGIB   Patient came to the hospital after presenting recurrent episodes of melena in the setting of meloxicam use.  Patient was found to have significant drop in her hemoglobin down to 7.6.  Underwent EGD on 02/10/2023 Which showed presence of large amount of clots in the stomach, with presence of a nonobstructing cratered duodenal ulcer with adherent clot the duodenal bulb measuring, ulcer measured close to 20 mm.  This was injected with epinephrine.  CT angio bleed performed on the same day showed presence of small less than esophagogastroduodenospy focus seen anterior to the gastroduodenal branch of the celiac artery, along with inflammatory changes in the distal stomach and proximal duodenum.  Due to this, the patient underwent cold embolization of the gastroduodenal artery .  Following this patient had further episodes of hematochezia with drop in hemoglobin and was transferred back to  [facility], ICU where repeat CTA did not demonstrate any extravasation and patient was at that time treated conservatively without any further bleeding and hence was discharged.  Patient required total of 7 units PRBC and 1 unit FFP Unfortunately, after embolization was performed, the patient had recurrent episodes of hematochezia with further drop in her hemoglobin and soft blood pressures.     Last EGD:02/2024 - Normal esophagus. - Large amount of Clotted blood in the entire stomach, unable to be suctioned hence inadequate exam - Non- obstructing large duodenal ulcer with large adherent clot; unroofing was not attempted. Perforation of the bowel wall cannot be ruled out. Injected. This is either large peptic ulcer vs mass vs walled off perforation - No specimens collected.  Last Colonoscopy:10 years ago as per patient   Last labs from 02/14/2023 with hemoglobin 10.1 platelet 197 Interestingly her alk phos is very low 34 AST ALT normal Ferritin 74  Past Medical History: Past Medical History:  Diagnosis Date   Abdominal hernia    Allergic rhinitis    Arthritis    Bone spur of other site    left foot   Dry eyes    Family history of kidney cancer    Family history of prostate cancer    Hypertension    Ovarian cancer (HCC)    Ovarian cancer on right (HCC) 06/17/2011   Peripheral neuropathy 08/26/2011   Chemotherapy-induced   Pulmonary embolism (HCC)    Sinusitis     Past Surgical History: Past Surgical History:  Procedure Laterality Date   ABDOMINAL HYSTERECTOMY  05/25/2011   [place] [facility] and cancer staging   CHOLECYSTECTOMY  01/05/1988   COLONOSCOPY N/A 09/06/2014   Procedure: COLONOSCOPY;  Surgeon: Malissa Hippo, MD;  Location: [facility];  Service: Endoscopy;  Laterality: N/A;  930 - moved to 9/2 @ 8:25   ESOPHAGOGASTRODUODENOSCOPY (EGD) WITH PROPOFOL N/A 02/10/2023   Procedure: ESOPHAGOGASTRODUODENOSCOPY (EGD) WITH PROPOFOL;  Surgeon: Franky Macho, MD;  Location: [facility];  Service: Endoscopy;  Laterality: N/A;   I & D EXTREMITY Left 12/13/2021   Procedure: IRRIGATION AND DEBRIDEMENT ANKLE/ LEG;  Surgeon: Sheral Apley, MD;  Location: [facility] OR;  Service: Orthopedics;  Laterality: Left;   INCISIONAL HERNIA REPAIR  10/15/2014   INCISIONAL HERNIA REPAIR  10/15/2014   Procedure: OPEN INCISIONAL HERNIA REPAIR WITH MESH AND MYOFASCIAL FLAPS;  Surgeon: Harriette Bouillon, MD;  Location: [facility] OR;  Service: General;;   IR ANGIOGRAM SELECTIVE EACH ADDITIONAL VESSEL  02/11/2023   IR ANGIOGRAM VISCERAL SELECTIVE  02/11/2023   IR EMBO ART  VEN HEMORR LYMPH EXTRAV  INC GUIDE ROADMAPPING  02/11/2023   IR US GUIDE VASC ACCESS RIGHT  02/11/2023   IVC filter  05/24/2011   PORT-A-CATH REMOVAL Right 11/21/2013   Procedure: MINOR REMOVAL PORT-A-CATH;  Surgeon: Dalia Heading, MD;  Location: [facility] ORS;  Service: General;  Laterality: Right;   PORTACATH PLACEMENT  06/16/2011   Done at [facility]   ROTATOR CUFF REPAIR Right    SCLEROTHERAPY  02/10/2023   Procedure: Susa Day;  Surgeon: Franky Macho, MD;  Location: [facility];  Service: Endoscopy;;   TONSILLECTOMY     age 88's   TOTAL HIP ARTHROPLASTY Right 08/26/2020   Procedure: TOTAL HIP ARTHROPLASTY ANTERIOR APPROACH;  Surgeon: Sheral Apley, MD;  Location: [facility] ORS;  Service: Orthopedics;  Laterality: Right;   TOTAL KNEE ARTHROPLASTY Left 07/20/2022   Procedure: TOTAL KNEE ARTHROPLASTY;  Surgeon: Sheral Apley, MD;  Location: [facility] ORS;  Service: Orthopedics;  Laterality: Left;   TUBAL LIGATION  01/05/1979    Family History: Family History  Problem Relation Age of Onset   Stroke Mother    Cervical cancer Mother 25   Kidney cancer Father 50   Aneurysm Paternal Grandmother        34s   Hodgkin's lymphoma Maternal Uncle 33   Prostate cancer Paternal Uncle    Throat cancer Paternal Uncle    Cancer Cousin        2 female maternal first cousins with unknown cancer   Colon cancer Neg Hx    Stomach cancer Neg Hx      Social History: Social History   Tobacco Use  Smoking Status Never  Smokeless Tobacco Never   Social History   Substance and Sexual Activity  Alcohol Use No   Social History   Substance and Sexual Activity  Drug Use No    Allergies: Allergies  Allergen Reactions   Erythromycin Itching   Codeine Nausea And Vomiting    Not right away after a couple days of taking    Medications: Current Outpatient Medications  Medication Sig Dispense Refill   acetaminophen (TYLENOL) 500 MG tablet Take 500 mg by mouth every 6 (six) hours as needed.     ALPRAZolam (XANAX) 0.5 MG tablet Take 0.5 mg by mouth at bedtime.     Bismuth Subsalicylate (PEPTO-BISMOL PO) Take by mouth.     fluticasone (FLONASE) 50 MCG/ACT nasal spray Place 1 spray into both nostrils daily as needed for allergies or rhinitis.     folic acid (FOLVITE) 1 MG tablet Take 1 mg by mouth daily.     HYDROcodone-acetaminophen (NORCO/VICODIN) 5-325  MG tablet Take 1-2 tablets by mouth every 6 (six) hours as needed for moderate pain (pain score 4-6).     KLOR-CON M20 20 MEQ tablet TAKE 1 TABLET BY MOUTH EVERY DAY (Patient taking differently: Take 20 mEq by mouth daily.) 90 tablet 1   leptospermum manuka honey (MEDIHONEY) PSTE paste Apply 1 Application topically daily. 30 mL 0   levocetirizine (XYZAL) 5 MG tablet Take 2.5 mg by mouth every evening.     Methotrexate, PF, (RASUVO) 10 MG/0.2ML SOAJ 10mg  sub injection every 30 days     pantoprazole (PROTONIX) 40 MG tablet TAKE 1 TABLET BY MOUTH TWICE A DAY 180 tablet 3   valsartan-hydrochlorothiazide (DIOVAN-HCT) 320-12.5 MG tablet Take 1 tablet by mouth daily.     vitamin B-12 100 MCG tablet Take 1 tablet (100 mcg total) by mouth daily. 30 tablet 0   Vitamin D, Ergocalciferol, (DRISDOL) 1.25 MG (50000 UNIT) CAPS capsule Take 50,000 Units by mouth every 7 (seven) days.     [Paused] aspirin EC 81 MG tablet Take 81 mg by mouth daily. Swallow whole. (Patient not taking: Reported on  04/06/2023)     sucralfate (CARAFATE) 1 GM/10ML suspension Take 10 mLs (1 g total) by mouth 4 (four) times daily -  with meals and at bedtime. (Patient not taking: Reported on 04/06/2023) 420 mL 0   No current facility-administered medications for this visit.   Facility-Administered Medications Ordered in Other Visits  Medication Dose Route Frequency Provider Last Rate Last Admin   sodium chloride 0.9 % injection 10 mL  10 mL Intravenous PRN Ellouise Newer, PA-C   10 mL at 08/31/12 1446   sodium chloride 0.9 % injection 10 mL  10 mL Intravenous PRN Alla German, MD   10 mL at 09/12/13 1408    Review of Systems: GENERAL: negative for malaise, night sweats HEENT: No changes in hearing or vision, no nose bleeds or other nasal problems. NECK: Negative for lumps, goiter, pain and significant neck swelling RESPIRATORY: Negative for cough, wheezing CARDIOVASCULAR: Negative for chest pain, leg swelling, palpitations, orthopnea GI: SEE HPI MUSCULOSKELETAL: Negative for joint pain or swelling, back pain, and muscle pain. SKIN: Negative for lesions, rash HEMATOLOGY Negative for prolonged bleeding, bruising easily, and swollen nodes. ENDOCRINE: Negative for cold or heat intolerance, polyuria, polydipsia and goiter. NEURO: negative for tremor, gait imbalance, syncope and seizures. The remainder of the review of systems is noncontributory.   Physical Exam: BP 129/75   Pulse 69   Temp (!) 97.5 F (36.4 C)   Ht 5' (1.524 m)   Wt 158 lb 11.2 oz (72 kg)   BMI 30.99 kg/m  GENERAL: The patient is AO x3, in no acute distress. HEENT: Head is normocephalic and atraumatic. EOMI are intact. Mouth is well hydrated and without lesions. NECK: Supple. No masses LUNGS: Clear to auscultation. No presence of rhonchi/wheezing/rales. Adequate chest expansion HEART: RRR, normal s1 and s2. ABDOMEN: Soft, nontender, no guarding, no peritoneal signs, and nondistended. BS +. No masses.  Imaging/Labs: as  above     Latest Ref Rng & Units 02/18/2023   11:40 AM 02/14/2023    2:33 AM 02/13/2023    3:27 PM  CBC  WBC 3.4 - 10.8 x10E3/uL 7.1  7.8  9.1   Hemoglobin 11.1 - 15.9 g/dL 54.0  98.1  19.1   Hematocrit 34.0 - 46.6 % 36.9  30.0  30.9   Platelets 150 - 450 x10E3/uL 244  197  185    Lab Results  Component Value Date   IRON 86 02/10/2023   TIBC 249 (L) 02/10/2023   FERRITIN 74 02/10/2023    I personally reviewed and interpreted the available labs, imaging and endoscopic files.  Negative intrinsic factor antibody .  Negative antiparietal antibody  H Pylori  stool testing was negative  Impression and Plan:  Sarah Walker is a 73 year old female with history of ovarian cancer status post complete hysterectomy and chemo, psoriasis, hypertension, who is here for a follow up with history of  massive upper GI bleed due to duodenal ulcer status post EGD and embolization and colon cancer screening .  #History Duodenal ulcer with massive upper GI bleed  Likely cause of peptic ulcer disease meloxicam and aspirin but need to evaluate for H. pylori   Taking protonix BID for 8 week. No futher overt bleeding , improved HBG and H. Pylori stool Ag was negative   Recs:  -Protonix 40mg  OD  -Repeat upper endoscopy to assess healing of the ulcer and to complete the exam as last EGD was with significant blood and was not an adequate exam.  Will take mapping biopsy at the same time to evaluate for H. Pylori and history of Vitamin b12 deficiency   -Avoid using high dose aspirin including Goody/BC powders, NSAIDs such as Aleve, ibuprofen, naproxen, Motrin, Voltaren or Advil (even the topical ones)  #Vitamin B12 deficiency  Negative  pernicious anemia labs , with anti-intrinsic factor antibody antiparietal antibody Continue with Vitamin B12 supplementation   #Colon cancer screening  The patient was counseled regarding the importance of colorectal cancer screening,  The benefits of screening include  early detection of colorectal cancer and precancerous polyps, which can improve treatment outcomes and reduce mortality. Risks associated with screening, particularly colonoscopy, include potential complications such as bleeding and perforation. After deciding different modalities for screening for colon cancer , patient has opted to pursue Colonoscopy , will perform with EGD.  All questions were answered.      [name], MD Gastroenterology and Hepatology [facility] Gastro[facility]   This chart has been completed using [facility] Dictation software, and while attempts have been made to ensure accuracy , certain words and phrases may not be transcribed as intended

## 2023-04-06 NOTE — Progress Notes (Signed)
 [name] , M.D. Gastroenterology & Hepatology [facility] [address] [place], [place] [address] Primary Care Physician: Assunta Found, MD [address] [place] [address]  Chief Complaint:  PUD, Screening colonoscopy   History of Present Illness:  Sarah Walker is a 73 year old female with history of ovarian cancer status post complete hysterectomy and chemo, psoriasis, hypertension, who is here for a follow up with history of  massive upper GI bleed due to duodenal ulcer status post EGD and embolization and colon cancer screening .  Patient reports that her stools are usually brown no black tarry stools.  Has 1-2 bowel movement daily patient does report taking meloxicam for many years and aspirin 81 mg which she has stopped since hospitalization .  he patient denies having any nausea, vomiting, fever, chills, hematochezia, melena, hematemesis, abdominal distention, abdominal pain, diarrhea, jaundice, pruritus or weight loss.  History of massive UGIB   Patient came to the hospital after presenting recurrent episodes of melena in the setting of meloxicam use.  Patient was found to have significant drop in her hemoglobin down to 7.6.  Underwent EGD on 02/10/2023 Which showed presence of large amount of clots in the stomach, with presence of a nonobstructing cratered duodenal ulcer with adherent clot the duodenal bulb measuring, ulcer measured close to 20 mm.  This was injected with epinephrine.  CT angio bleed performed on the same day showed presence of small less than esophagogastroduodenospy focus seen anterior to the gastroduodenal branch of the celiac artery, along with inflammatory changes in the distal stomach and proximal duodenum.  Due to this, the patient underwent cold embolization of the gastroduodenal artery .  Following this patient had further episodes of hematochezia with drop in hemoglobin and was transferred back to  [facility], ICU where repeat CTA did not demonstrate any extravasation and patient was at that time treated conservatively without any further bleeding and hence was discharged.  Patient required total of 7 units PRBC and 1 unit FFP Unfortunately, after embolization was performed, the patient had recurrent episodes of hematochezia with further drop in her hemoglobin and soft blood pressures.     Last EGD:02/2024 - Normal esophagus. - Large amount of Clotted blood in the entire stomach, unable to be suctioned hence inadequate exam - Non- obstructing large duodenal ulcer with large adherent clot; unroofing was not attempted. Perforation of the bowel wall cannot be ruled out. Injected. This is either large peptic ulcer vs mass vs walled off perforation - No specimens collected.  Last Colonoscopy:10 years ago as per patient   Last labs from 02/14/2023 with hemoglobin 10.1 platelet 197 Interestingly her alk phos is very low 34 AST ALT normal Ferritin 74  Past Medical History: Past Medical History:  Diagnosis Date   Abdominal hernia    Allergic rhinitis    Arthritis    Bone spur of other site    left foot   Dry eyes    Family history of kidney cancer    Family history of prostate cancer    Hypertension    Ovarian cancer (HCC)    Ovarian cancer on right (HCC) 06/17/2011   Peripheral neuropathy 08/26/2011   Chemotherapy-induced   Pulmonary embolism (HCC)    Sinusitis     Past Surgical History: Past Surgical History:  Procedure Laterality Date   ABDOMINAL HYSTERECTOMY  05/25/2011   [place] bs&[facility] and cancer staging   CHOLECYSTECTOMY  01/05/1988   COLONOSCOPY N/A 09/06/2014   Procedure: COLONOSCOPY;  Surgeon: Malissa Hippo, MD;  Location: [facility];  Service: Endoscopy;  Laterality: N/A;  930 - moved to 9/2 @ 8:25   ESOPHAGOGASTRODUODENOSCOPY (EGD) WITH PROPOFOL N/A 02/10/2023   Procedure: ESOPHAGOGASTRODUODENOSCOPY (EGD) WITH PROPOFOL;  Surgeon: Franky Macho, MD;  Location: [facility];  Service: Endoscopy;  Laterality: N/A;   I & D EXTREMITY Left 12/13/2021   Procedure: IRRIGATION AND DEBRIDEMENT ANKLE/ LEG;  Surgeon: Sheral Apley, MD;  Location: [facility] OR;  Service: Orthopedics;  Laterality: Left;   INCISIONAL HERNIA REPAIR  10/15/2014   INCISIONAL HERNIA REPAIR  10/15/2014   Procedure: OPEN INCISIONAL HERNIA REPAIR WITH MESH AND MYOFASCIAL FLAPS;  Surgeon: Harriette Bouillon, MD;  Location: [facility] OR;  Service: General;;   IR ANGIOGRAM SELECTIVE EACH ADDITIONAL VESSEL  02/11/2023   IR ANGIOGRAM VISCERAL SELECTIVE  02/11/2023   IR EMBO ART  VEN HEMORR LYMPH EXTRAV  INC GUIDE ROADMAPPING  02/11/2023   IR US GUIDE VASC ACCESS RIGHT  02/11/2023   IVC filter  05/24/2011   PORT-A-CATH REMOVAL Right 11/21/2013   Procedure: MINOR REMOVAL PORT-A-CATH;  Surgeon: Dalia Heading, MD;  Location: [facility] ORS;  Service: General;  Laterality: Right;   PORTACATH PLACEMENT  06/16/2011   Done at [facility]   ROTATOR CUFF REPAIR Right    SCLEROTHERAPY  02/10/2023   Procedure: Susa Day;  Surgeon: Franky Macho, MD;  Location: [facility];  Service: Endoscopy;;   TONSILLECTOMY     age 88's   TOTAL HIP ARTHROPLASTY Right 08/26/2020   Procedure: TOTAL HIP ARTHROPLASTY ANTERIOR APPROACH;  Surgeon: Sheral Apley, MD;  Location: [facility] ORS;  Service: Orthopedics;  Laterality: Right;   TOTAL KNEE ARTHROPLASTY Left 07/20/2022   Procedure: TOTAL KNEE ARTHROPLASTY;  Surgeon: Sheral Apley, MD;  Location: [facility] ORS;  Service: Orthopedics;  Laterality: Left;   TUBAL LIGATION  01/05/1979    Family History: Family History  Problem Relation Age of Onset   Stroke Mother    Cervical cancer Mother 25   Kidney cancer Father 50   Aneurysm Paternal Grandmother        34s   Hodgkin's lymphoma Maternal Uncle 33   Prostate cancer Paternal Uncle    Throat cancer Paternal Uncle    Cancer Cousin        2 female maternal first cousins with unknown cancer   Colon cancer Neg Hx    Stomach cancer Neg Hx      Social History: Social History   Tobacco Use  Smoking Status Never  Smokeless Tobacco Never   Social History   Substance and Sexual Activity  Alcohol Use No   Social History   Substance and Sexual Activity  Drug Use No    Allergies: Allergies  Allergen Reactions   Erythromycin Itching   Codeine Nausea And Vomiting    Not right away after a couple days of taking    Medications: Current Outpatient Medications  Medication Sig Dispense Refill   acetaminophen (TYLENOL) 500 MG tablet Take 500 mg by mouth every 6 (six) hours as needed.     ALPRAZolam (XANAX) 0.5 MG tablet Take 0.5 mg by mouth at bedtime.     Bismuth Subsalicylate (PEPTO-BISMOL PO) Take by mouth.     fluticasone (FLONASE) 50 MCG/ACT nasal spray Place 1 spray into both nostrils daily as needed for allergies or rhinitis.     folic acid (FOLVITE) 1 MG tablet Take 1 mg by mouth daily.     HYDROcodone-acetaminophen (NORCO/VICODIN) 5-325  MG tablet Take 1-2 tablets by mouth every 6 (six) hours as needed for moderate pain (pain score 4-6).     KLOR-CON M20 20 MEQ tablet TAKE 1 TABLET BY MOUTH EVERY DAY (Patient taking differently: Take 20 mEq by mouth daily.) 90 tablet 1   leptospermum manuka honey (MEDIHONEY) PSTE paste Apply 1 Application topically daily. 30 mL 0   levocetirizine (XYZAL) 5 MG tablet Take 2.5 mg by mouth every evening.     Methotrexate, PF, (RASUVO) 10 MG/0.2ML SOAJ 10mg  sub injection every 30 days     pantoprazole (PROTONIX) 40 MG tablet TAKE 1 TABLET BY MOUTH TWICE A DAY 180 tablet 3   valsartan-hydrochlorothiazide (DIOVAN-HCT) 320-12.5 MG tablet Take 1 tablet by mouth daily.     vitamin B-12 100 MCG tablet Take 1 tablet (100 mcg total) by mouth daily. 30 tablet 0   Vitamin D, Ergocalciferol, (DRISDOL) 1.25 MG (50000 UNIT) CAPS capsule Take 50,000 Units by mouth every 7 (seven) days.     [Paused] aspirin EC 81 MG tablet Take 81 mg by mouth daily. Swallow whole. (Patient not taking: Reported on  04/06/2023)     sucralfate (CARAFATE) 1 GM/10ML suspension Take 10 mLs (1 g total) by mouth 4 (four) times daily -  with meals and at bedtime. (Patient not taking: Reported on 04/06/2023) 420 mL 0   No current facility-administered medications for this visit.   Facility-Administered Medications Ordered in Other Visits  Medication Dose Route Frequency Provider Last Rate Last Admin   sodium chloride 0.9 % injection 10 mL  10 mL Intravenous PRN Ellouise Newer, PA-C   10 mL at 08/31/12 1446   sodium chloride 0.9 % injection 10 mL  10 mL Intravenous PRN Alla German, MD   10 mL at 09/12/13 1408    Review of Systems: GENERAL: negative for malaise, night sweats HEENT: No changes in hearing or vision, no nose bleeds or other nasal problems. NECK: Negative for lumps, goiter, pain and significant neck swelling RESPIRATORY: Negative for cough, wheezing CARDIOVASCULAR: Negative for chest pain, leg swelling, palpitations, orthopnea GI: SEE HPI MUSCULOSKELETAL: Negative for joint pain or swelling, back pain, and muscle pain. SKIN: Negative for lesions, rash HEMATOLOGY Negative for prolonged bleeding, bruising easily, and swollen nodes. ENDOCRINE: Negative for cold or heat intolerance, polyuria, polydipsia and goiter. NEURO: negative for tremor, gait imbalance, syncope and seizures. The remainder of the review of systems is noncontributory.   Physical Exam: BP 129/75   Pulse 69   Temp (!) 97.5 F (36.4 C)   Ht 5' (1.524 m)   Wt 158 lb 11.2 oz (72 kg)   BMI 30.99 kg/m  GENERAL: The patient is AO x3, in no acute distress. HEENT: Head is normocephalic and atraumatic. EOMI are intact. Mouth is well hydrated and without lesions. NECK: Supple. No masses LUNGS: Clear to auscultation. No presence of rhonchi/wheezing/rales. Adequate chest expansion HEART: RRR, normal s1 and s2. ABDOMEN: Soft, nontender, no guarding, no peritoneal signs, and nondistended. BS +. No masses.  Imaging/Labs: as  above     Latest Ref Rng & Units 02/18/2023   11:40 AM 02/14/2023    2:33 AM 02/13/2023    3:27 PM  CBC  WBC 3.4 - 10.8 x10E3/uL 7.1  7.8  9.1   Hemoglobin 11.1 - 15.9 g/dL 54.0  98.1  19.1   Hematocrit 34.0 - 46.6 % 36.9  30.0  30.9   Platelets 150 - 450 x10E3/uL 244  197  185    Lab Results  Component Value Date   IRON 86 02/10/2023   TIBC 249 (L) 02/10/2023   FERRITIN 74 02/10/2023    I personally reviewed and interpreted the available labs, imaging and endoscopic files.  Negative intrinsic factor antibody .  Negative antiparietal antibody  H Pylori  stool testing was negative  Impression and Plan:  Sarah Walker is a 73 year old female with history of ovarian cancer status post complete hysterectomy and chemo, psoriasis, hypertension, who is here for a follow up with history of  massive upper GI bleed due to duodenal ulcer status post EGD and embolization and colon cancer screening .  #History Duodenal ulcer with massive upper GI bleed  Likely cause of peptic ulcer disease meloxicam and aspirin but need to evaluate for H. pylori   Taking protonix BID for 8 week. No futher overt bleeding , improved HBG and H. Pylori stool Ag was negative   Recs:  -Protonix 40mg  OD  -Repeat upper endoscopy to assess healing of the ulcer and to complete the exam as last EGD was with significant blood and was not an adequate exam.  Will take mapping biopsy at the same time to evaluate for H. Pylori and history of Vitamin b12 deficiency   -Avoid using high dose aspirin including Goody/BC powders, NSAIDs such as Aleve, ibuprofen, naproxen, Motrin, Voltaren or Advil (even the topical ones)  #Vitamin B12 deficiency  Negative  pernicious anemia labs , with anti-intrinsic factor antibody antiparietal antibody Continue with Vitamin B12 supplementation   #Colon cancer screening  The patient was counseled regarding the importance of colorectal cancer screening,  The benefits of screening include  early detection of colorectal cancer and precancerous polyps, which can improve treatment outcomes and reduce mortality. Risks associated with screening, particularly colonoscopy, include potential complications such as bleeding and perforation. After deciding different modalities for screening for colon cancer , patient has opted to pursue Colonoscopy , will perform with EGD.  All questions were answered.      [name], MD Gastroenterology and Hepatology [facility] Gastroenterology   This chart has been completed using [facility] Dictation software, and while attempts have been made to ensure accuracy , certain words and phrases may not be transcribed as intended

## 2023-04-06 NOTE — Patient Instructions (Addendum)
 It was very nice to meet you today, as dicussed with will plan for the following :  Avoid using high dose aspirin including Goody/BC powders, NSAIDs such as Aleve, ibuprofen, naproxen, Motrin, Voltaren or Advil (even the topical ones)  See you next week for Egd and Colonoscopy

## 2023-04-12 ENCOUNTER — Other Ambulatory Visit (HOSPITAL_COMMUNITY)
Admission: RE | Admit: 2023-04-12 | Discharge: 2023-04-12 | Disposition: A | Discharge status: Home / Self Care | Source: Ambulatory Visit | Attending: Gastroenterology | Admitting: Gastroenterology

## 2023-04-12 DIAGNOSIS — Z1211 Encounter for screening for malignant neoplasm of colon: Secondary | ICD-10-CM | POA: Insufficient documentation

## 2023-04-12 DIAGNOSIS — K269 Duodenal ulcer, unspecified as acute or chronic, without hemorrhage or perforation: Secondary | ICD-10-CM | POA: Diagnosis not present

## 2023-04-12 LAB — BASIC METABOLIC PANEL WITH GFR
Anion gap: 7 (ref 5–15)
BUN: 26 mg/dL — ABNORMAL HIGH (ref 8–23)
CO2: 26 mmol/L (ref 22–32)
Calcium: 9 mg/dL (ref 8.9–10.3)
Chloride: 104 mmol/L (ref 98–111)
Creatinine, Ser: 0.9 mg/dL (ref 0.44–1.00)
GFR, Estimated: >60 mL/min (ref >60)
Glucose, Bld: 89 mg/dL (ref 70–99)
Potassium: 3.9 mmol/L (ref 3.5–5.1)
Sodium: 137 mmol/L (ref 135–145)

## 2023-04-13 ENCOUNTER — Telehealth (INDEPENDENT_AMBULATORY_CARE_PROVIDER_SITE_OTHER): Payer: Self-pay | Admitting: Gastroenterology

## 2023-04-13 NOTE — Telephone Encounter (Signed)
 Clarified with Dr.Ahmed if pts are to continue potassium while taking prep from [facility]-Dr.Ahmed states they should continue it. Contacted pt and made her aware to continue potassium.

## 2023-04-13 NOTE — Telephone Encounter (Signed)
 Pt left voicemail stating she has questions about the low residue breakfast. Contacted pt and she asked if she could have a low residue breakfast since that is what it says on the box. Advised pt to follow the instructions that we gave her and not the ones on the box. Pt asked if she needed to hold her potassium tomorrow and nurse advised yes. Pt verbalized understanding.

## 2023-04-14 ENCOUNTER — Encounter (INDEPENDENT_AMBULATORY_CARE_PROVIDER_SITE_OTHER): Payer: Self-pay | Admitting: *Deleted

## 2023-04-14 ENCOUNTER — Ambulatory Visit (HOSPITAL_COMMUNITY)
Admission: RE | Admit: 2023-04-14 | Discharge: 2023-04-14 | Disposition: A | Discharge status: Home / Self Care | Attending: Gastroenterology | Admitting: Gastroenterology

## 2023-04-14 ENCOUNTER — Ambulatory Visit (HOSPITAL_BASED_OUTPATIENT_CLINIC_OR_DEPARTMENT_OTHER): Admitting: Certified Registered"

## 2023-04-14 ENCOUNTER — Encounter (HOSPITAL_COMMUNITY)
Admission: RE | Disposition: A | Discharge status: Home / Self Care | Payer: Self-pay | Source: Home / Self Care | Attending: Gastroenterology

## 2023-04-14 ENCOUNTER — Ambulatory Visit (HOSPITAL_COMMUNITY): Admitting: Certified Registered"

## 2023-04-14 ENCOUNTER — Other Ambulatory Visit: Payer: Self-pay

## 2023-04-14 ENCOUNTER — Encounter (HOSPITAL_COMMUNITY): Payer: Self-pay | Admitting: Gastroenterology

## 2023-04-14 DIAGNOSIS — D123 Benign neoplasm of transverse colon: Secondary | ICD-10-CM | POA: Diagnosis not present

## 2023-04-14 DIAGNOSIS — Z8711 Personal history of peptic ulcer disease: Secondary | ICD-10-CM | POA: Diagnosis not present

## 2023-04-14 DIAGNOSIS — K921 Melena: Secondary | ICD-10-CM

## 2023-04-14 DIAGNOSIS — Z86711 Personal history of pulmonary embolism: Secondary | ICD-10-CM | POA: Diagnosis not present

## 2023-04-14 DIAGNOSIS — K648 Other hemorrhoids: Secondary | ICD-10-CM | POA: Insufficient documentation

## 2023-04-14 DIAGNOSIS — Z139 Encounter for screening, unspecified: Secondary | ICD-10-CM | POA: Diagnosis not present

## 2023-04-14 DIAGNOSIS — K2950 Unspecified chronic gastritis without bleeding: Secondary | ICD-10-CM

## 2023-04-14 DIAGNOSIS — T183XXA Foreign body in small intestine, initial encounter: Secondary | ICD-10-CM

## 2023-04-14 DIAGNOSIS — I10 Essential (primary) hypertension: Secondary | ICD-10-CM

## 2023-04-14 DIAGNOSIS — K909 Intestinal malabsorption, unspecified: Secondary | ICD-10-CM

## 2023-04-14 DIAGNOSIS — R197 Diarrhea, unspecified: Secondary | ICD-10-CM

## 2023-04-14 DIAGNOSIS — W449XXA Unspecified foreign body entering into or through a natural orifice, initial encounter: Secondary | ICD-10-CM | POA: Insufficient documentation

## 2023-04-14 DIAGNOSIS — Z1211 Encounter for screening for malignant neoplasm of colon: Secondary | ICD-10-CM | POA: Diagnosis not present

## 2023-04-14 DIAGNOSIS — K635 Polyp of colon: Secondary | ICD-10-CM | POA: Diagnosis not present

## 2023-04-14 DIAGNOSIS — K2970 Gastritis, unspecified, without bleeding: Secondary | ICD-10-CM

## 2023-04-14 HISTORY — PX: ESOPHAGOGASTRODUODENOSCOPY: SHX5428

## 2023-04-14 HISTORY — PX: COLONOSCOPY: SHX5424

## 2023-04-14 LAB — HM COLONOSCOPY

## 2023-04-14 SURGERY — EGD (ESOPHAGOGASTRODUODENOSCOPY)
Anesthesia: General

## 2023-04-14 MED ORDER — EPHEDRINE SULFATE-NACL 50-0.9 MG/10ML-% IV SOSY
PREFILLED_SYRINGE | INTRAVENOUS | Status: DC | PRN
Start: 2023-04-14 — End: 2023-04-14
  Administered 2023-04-14 (×3): 5 mg via INTRAVENOUS

## 2023-04-14 MED ORDER — PROPOFOL 10 MG/ML IV BOLUS
INTRAVENOUS | Status: DC | PRN
  Administered 2023-04-14: 40 mg via INTRAVENOUS
  Administered 2023-04-14: 100 mg via INTRAVENOUS

## 2023-04-14 MED ORDER — EPHEDRINE 5 MG/ML INJ
INTRAVENOUS | Status: AC
  Filled 2023-04-14: qty 5

## 2023-04-14 MED ORDER — LIDOCAINE HCL (PF) 2 % IJ SOLN
INTRAMUSCULAR | Status: DC | PRN
  Administered 2023-04-14: 100 mg via INTRADERMAL

## 2023-04-14 MED ORDER — PHENYLEPHRINE 80 MCG/ML (10ML) SYRINGE FOR IV PUSH (FOR BLOOD PRESSURE SUPPORT)
PREFILLED_SYRINGE | INTRAVENOUS | Status: DC | PRN
Start: 2023-04-14 — End: 2023-04-14
  Administered 2023-04-14 (×4): 80 ug via INTRAVENOUS

## 2023-04-14 MED ORDER — PHENYLEPHRINE 80 MCG/ML (10ML) SYRINGE FOR IV PUSH (FOR BLOOD PRESSURE SUPPORT)
PREFILLED_SYRINGE | INTRAVENOUS | Status: AC
  Filled 2023-04-14: qty 10

## 2023-04-14 MED ORDER — PROPOFOL 500 MG/50ML IV EMUL
INTRAVENOUS | Status: DC | PRN
  Administered 2023-04-14: 150 ug/kg/min via INTRAVENOUS

## 2023-04-14 MED ORDER — STERILE WATER FOR IRRIGATION IR SOLN
Status: DC | PRN
  Administered 2023-04-14: 180 mL

## 2023-04-14 MED ORDER — LACTATED RINGERS IV SOLN: INTRAVENOUS | Status: DC | PRN

## 2023-04-14 MED ORDER — LACTATED RINGERS IV SOLN: INTRAVENOUS | Status: DC

## 2023-04-14 NOTE — Op Note (Addendum)
 [facility] Patient Name: Sarah Walker Procedure Date: 04/14/2023 9:37 AM MRN: [id number] Date of Birth: Sep 16, 1950 Attending MD: Sanjuan Dame , MD, [id number] CSN: [id number] Age: 73 Admit Type: Outpatient Procedure:                Upper GI endoscopy Indications:              Personal history of peptic ulcer disease Providers:                Sanjuan Dame, MD, Francoise Ceo RN, RN, Lennice Sites                            Technician, Technician, [place] Wilson Referring MD:              Medicines:                Monitored Anesthesia Care Complications:            No immediate complications. Estimated Blood Loss:     Estimated blood loss: none. Procedure:                Pre-Anesthesia Assessment:                           - Prior to the procedure, a History and Physical                            was performed, and patient medications and                            allergies were reviewed. The patient's tolerance of                            previous anesthesia was also reviewed. The risks                            and benefits of the procedure and the sedation                            options and risks were discussed with the patient.                            All questions were answered, and informed consent                            was obtained. Prior Anticoagulants: The patient has                            taken no anticoagulant or antiplatelet agents. ASA                            Grade Assessment: II - A patient with mild systemic                            disease. After reviewing the risks and benefits,  the patient was deemed in satisfactory condition to                            undergo the procedure.                           After obtaining informed consent, the endoscope was                            passed under direct vision. Throughout the                            procedure, the patient's blood pressure, pulse, and                             oxygen saturations were monitored continuously. The                            [id number]-H190 ([id number]) scope was introduced through the                            mouth, and advanced to the second part of duodenum.                            The upper GI endoscopy was accomplished without                            difficulty. The patient tolerated the procedure                            well. Scope In: 9:48:42 AM Scope Out: 9:54:31 AM Total Procedure Duration: 0 hours 5 minutes 49 seconds  Findings:      The examined esophagus was normal.      Mild inflammation characterized by erythema was found in the gastric       body. Biopsies were taken with a cold forceps for histology.      A foreign body was found in the duodenal bulb.      The second portion of the duodenum was normal. Impression:               - Normal esophagus.                           - Gastritis. Biopsied.                           - Duodenal foreign body in the bulb.This appears to                            be coils from embolization at the same area where                            previous ulcer was seen. No ulcer seen                           -  Normal second portion of the duodenum.                           - I discussed case with IR on call ( Dr Fredia Sorrow) ,                            who thinks that these appears to be coils eroded                            through the wall of the duodenum. Given patient is                            asymptomatic without any overt bleeding , they                            recommend to monitor this for now and treat it as                            an ulcer , may need repeat EGD or CTA in future Moderate Sedation:      Per Anesthesia Care Recommendation:           - Patient has a contact number available for                            emergencies. The signs and symptoms of potential                            delayed complications were discussed with the                             patient. Return to normal activities tomorrow.                            Written discharge instructions were provided to the                            patient.                           - Resume previous diet.                           - Continue present medications.                           - Await pathology results.                           -ED precautions given : if any black colour stools                            , coffee ground emesis, hematochezia or any overt  bleeding come to the hospital right away                           -Avoid NSAID                           -Continue PPI                           -Possible repeat in 3-4 months Procedure Code(s):        --- Professional ---                           [phone], Esophagogastroduodenoscopy, flexible,                            transoral; with biopsy, single or multiple Diagnosis Code(s):        --- Professional ---                           K29.70, Gastritis, unspecified, without bleeding                           T18.3XXA, Foreign body in small intestine, initial                            encounter                           Z87.11, Personal history of peptic ulcer disease CPT copyright 2022 [employer]. All rights reserved. The codes documented in this report are preliminary and upon coder review may  be revised to meet current compliance requirements. Sanjuan Dame, MD Sanjuan Dame, MD 04/14/2023 9:59:09 AM This report has been signed electronically. Number of Addenda: 0

## 2023-04-14 NOTE — Op Note (Signed)
 [facility] Patient Name: Sarah Walker Procedure Date: 04/14/2023 9:36 AM MRN: [id number] Date of Birth: 1950-06-02 Attending MD: Sanjuan Dame , MD, [id number] CSN: [id number] Age: 73 Admit Type: Outpatient Procedure:                Colonoscopy Indications:              Screening for colorectal malignant neoplasm Providers:                Sanjuan Dame, MD, Francoise Ceo RN, RN, Lennice Sites                            Technician, Technician, [place] Wilson Referring MD:              Medicines:                Monitored Anesthesia Care Complications:            No immediate complications. Estimated Blood Loss:     Estimated blood loss: none. Procedure:                Pre-Anesthesia Assessment:                           - Prior to the procedure, a History and Physical                            was performed, and patient medications and                            allergies were reviewed. The patient's tolerance of                            previous anesthesia was also reviewed. The risks                            and benefits of the procedure and the sedation                            options and risks were discussed with the patient.                            All questions were answered, and informed consent                            was obtained. Prior Anticoagulants: The patient has                            taken no anticoagulant or antiplatelet agents. ASA                            Grade Assessment: II - A patient with mild systemic                            disease. After reviewing the risks and benefits,  the patient was deemed in satisfactory condition to                            undergo the procedure.                           After obtaining informed consent, the colonoscope                            was passed under direct vision. Throughout the                            procedure, the patient's blood pressure, pulse, and                             oxygen saturations were monitored continuously. The                            [id number]-[id number] ([id number]) scope was introduced through                            the anus and advanced to the the cecum, identified                            by appendiceal orifice and ileocecal valve. The                            colonoscopy was performed without difficulty. The                            patient tolerated the procedure well. The quality                            of the bowel preparation was evaluated using the                            BBPS [facility] Bowel Preparation Scale) with scores                            of: Right Colon = 2 (minor amount of residual                            staining, small fragments of stool and/or opaque                            liquid, but mucosa seen well), Transverse Colon = 2                            (minor amount of residual staining, small fragments                            of stool and/or opaque liquid, but mucosa seen  well) and Left Colon = 2 (minor amount of residual                            staining, small fragments of stool and/or opaque                            liquid, but mucosa seen well). The total BBPS score                            equals 6. The ileocecal valve, appendiceal orifice,                            and rectum were photographed. Scope In: 10:04:36 AM Scope Out: 10:23:40 AM Scope Withdrawal Time: 0 hours 15 minutes 27 seconds  Total Procedure Duration: 0 hours 19 minutes 4 seconds  Findings:      The perianal and digital rectal examinations were normal.      A 6 mm polyp was found in the hepatic flexure. The polyp was sessile.       The polyp was removed with a cold snare. Resection and retrieval were       complete.      Copious quantities of stool was found in the entire colon, precluding       visualization. Lavage of the area was performed using copious amounts of       sterile water,  resulting in clearance with good visualization.      There is no endoscopic evidence of mass in the entire colon. Biopsies       for histology were taken with a cold forceps from the entire colon for       evaluation of microscopic colitis.      Non-bleeding internal hemorrhoids were found during retroflexion. The       hemorrhoids were small. Impression:               - One 6 mm polyp at the hepatic flexure, removed                            with a cold snare. Resected and retrieved.                           - Stool in the entire examined colon.                           - Non-bleeding internal hemorrhoids. Moderate Sedation:      Per Anesthesia Care Recommendation:           - Patient has a contact number available for                            emergencies. The signs and symptoms of potential                            delayed complications were discussed with the                            patient. Return to normal activities tomorrow.  Written discharge instructions were provided to the                            patient.                           - High fiber diet.                           - Continue present medications.                           - Await pathology results.                           - Repeat colonoscopy in 5 years for surveillance                            given fair prep and if medically fit .                           - Return to GI office as previously scheduled. Procedure Code(s):        --- Professional ---                           [id number], Colonoscopy, flexible; with removal of                            tumor(s), polyp(s), or other lesion(s) by snare                            technique                           45380, 59, Colonoscopy, flexible; with biopsy,                            single or multiple Diagnosis Code(s):        --- Professional ---                           Z12.11, Encounter for screening for malignant                             neoplasm of colon                           D12.3, Benign neoplasm of transverse colon (hepatic                            flexure or splenic flexure)                           K64.8, Other hemorrhoids CPT copyright 2022 American Medical Association. All rights reserved. The codes documented in this report are preliminary and upon coder review may  be revised to meet current compliance requirements. Sanjuan Dame, MD Sanjuan Dame, MD  04/14/2023 10:35:04 AM This report has been signed electronically. Number of Addenda: 0

## 2023-04-14 NOTE — Anesthesia Preprocedure Evaluation (Signed)
 Anesthesia Evaluation  Patient identified by MRN, date of birth, ID band Patient awake    Reviewed: Allergy & Precautions, H&P , NPO status , Patient's Chart, lab work & pertinent test results, reviewed documented beta blocker date and time   Airway Mallampati: II  TM Distance: >3 FB Neck ROM: full    Dental no notable dental hx. (+) Dental Advisory Given, Teeth Intact   Pulmonary PE   Pulmonary exam normal breath sounds clear to auscultation       Cardiovascular Exercise Tolerance: Good hypertension, negative cardio ROS Normal cardiovascular exam Rhythm:regular Rate:Normal     Neuro/Psych  Neuromuscular disease  negative psych ROS   GI/Hepatic negative GI ROS, Neg liver ROS,,,  Endo/Other  negative endocrine ROS    Renal/GU negative Renal ROS  negative genitourinary   Musculoskeletal  (+) Arthritis , Osteoarthritis,    Abdominal   Peds  Hematology  (+) Blood dyscrasia, anemia Hgb 7.6   Anesthesia Other Findings Ovarian cancer  Reproductive/Obstetrics negative OB ROS                              Anesthesia Physical Anesthesia Plan  ASA: 3 and emergent  Anesthesia Plan: General   Post-op Pain Management: Minimal or no pain anticipated   Induction: Intravenous  PONV Risk Score and Plan: Propofol infusion  Airway Management Planned: Nasal Cannula and Natural Airway  Additional Equipment: None  Intra-op Plan:   Post-operative Plan:   Informed Consent: I have reviewed the patients History and Physical, chart, labs and discussed the procedure including the risks, benefits and alternatives for the proposed anesthesia with the patient or authorized representative who has indicated his/her understanding and acceptance.     Dental Advisory Given  Plan Discussed with: CRNA  Anesthesia Plan Comments:         Anesthesia Quick Evaluation

## 2023-04-14 NOTE — Interval H&P Note (Signed)
 History and Physical Interval Note:  04/14/2023 9:03 AM  Sarah Walker  has presented today for surgery, with the diagnosis of PUD, SCREENING.  The various methods of treatment have been discussed with the patient and family. After consideration of risks, benefits and other options for treatment, the patient has consented to  Procedure(s) with comments: EGD (ESOPHAGOGASTRODUODENOSCOPY) (N/A) - 10:00AM;ASA 1-2 COLONOSCOPY (N/A) - 10:00AM;ASA 1-2 as a surgical intervention.  The patient's history has been reviewed, patient examined, no change in status, stable for surgery.  I have reviewed the patient's chart and labs.  Questions were answered to the patient's satisfaction.     Juanetta Beets Sarah Walker

## 2023-04-14 NOTE — Transfer of Care (Signed)
 Immediate Anesthesia Transfer of Care Note  Patient: Sarah Walker  Procedure(s) Performed: EGD (ESOPHAGOGASTRODUODENOSCOPY) COLONOSCOPY  Patient Location: Endoscopy Unit  Anesthesia Type:General  Level of Consciousness: awake  Airway & Oxygen Therapy: Patient Spontanous Breathing  Post-op Assessment: Report given to RN and Post -op Vital signs reviewed and stable  Post vital signs: Reviewed and stable  Last Vitals:  Vitals Value Taken Time  BP 114/51 04/14/23 1028  Temp 36.5 C 04/14/23 1028  Pulse 60 04/14/23 1028  Resp 15 04/14/23 1028  SpO2 100 % 04/14/23 1028    Last Pain:  Vitals:   04/14/23 1028  TempSrc: Axillary  PainSc: 0-No pain      Patients Stated Pain Goal: 8 (04/14/23 0849)  Complications: No notable events documented.

## 2023-04-14 NOTE — Anesthesia Postprocedure Evaluation (Signed)
 Anesthesia Post Note  Patient: IRIDIANA FONNER  Procedure(s) Performed: EGD (ESOPHAGOGASTRODUODENOSCOPY) COLONOSCOPY  Patient location during evaluation: Phase II Anesthesia Type: General Level of consciousness: awake Pain management: pain level controlled Vital Signs Assessment: post-procedure vital signs reviewed and stable Respiratory status: spontaneous breathing and respiratory function stable Cardiovascular status: blood pressure returned to baseline and stable Postop Assessment: no headache and no apparent nausea or vomiting Anesthetic complications: no Comments: Late entry   No notable events documented.   Last Vitals:  Vitals:   04/14/23 0849 04/14/23 1028  BP: (!) 144/74 (!) 114/51  Pulse: 60 60  Resp: 17 15  Temp: 36.7 C 36.5 C  SpO2: 99% 100%    Last Pain:  Vitals:   04/14/23 1028  TempSrc: Axillary  PainSc: 0-No pain                 Windell Norfolk

## 2023-04-14 NOTE — Anesthesia Procedure Notes (Addendum)
 Date/Time: 04/14/2023 9:42 AM  Performed by: Julian Reil, CRNAPre-anesthesia Checklist: Patient identified, Emergency Drugs available, Suction available and Patient being monitored Patient Re-evaluated:Patient Re-evaluated prior to induction Oxygen Delivery Method: Nasal cannula Induction Type: IV induction Placement Confirmation: positive ETCO2 Comments: Optiflow High Flow [place] O2 used.

## 2023-04-14 NOTE — Discharge Instructions (Signed)

## 2023-04-15 ENCOUNTER — Encounter (HOSPITAL_COMMUNITY): Payer: Self-pay | Admitting: Gastroenterology

## 2023-04-18 LAB — SURGICAL PATHOLOGY

## 2023-04-18 NOTE — Progress Notes (Signed)
 I reviewed the pathology results. Sarah Walker, can you send her a letter with the findings as described below please?  Repeat colonoscopy in 5 years Repeat Upper endoscopy in 6 months  Thanks,  Davinder Haff Sarah Walker Sarah Lavallee, MD Gastroenterology and Hepatology [facility]  ---------------------------------------------------------------------------------------------  [facility] [address], [address], [place], [place] [address] Phone:  [phone]   04/18/23 Sarah Walker, [place]   Dear Sarah Walker,  I am writing to inform you that the biopsies taken during your recent endoscopic examination showed:  FINAL MICROSCOPIC DIAGNOSIS:   A. STOMACH, BIOPSY:  Antral and oxyntic mucosa with mild chronic inflammation.  Immunohistochemistry for Helicobacter pylori is negative.   B. HEPATIC FLEXURE, POLYPECTOMY:  Tubular adenoma (1) without high grade dysplasia.   C. COLON, RANDOM, BIOPSY:  Colonic mucosa with no significant pathologic changes.  No microscopic colitis, active inflammation or granulomas.    What does this mean?    You had a total of 1 polyp removed. The pathology came back as "tubular adenoma." These findings are NOT cancer, but had the polyps remained in your colon, they could have turned into cancer.  Given these findings, it is recommended that your next colonoscopy be performed in 5 years given fair prep .   Also regarding upper endoscopy showed  :  No H. Pylori bacteria in stomach , or any early cancer changes to the stomach mucosa ( Intestinal metaplasia)  Normal biopsies of the food-pipe ( no eosinophilic esophagitis )   I had reached out to the interventional radiologist regarding the coils I saw in the duodenum and they report that nothing to be done for it unless you have any bleeding or any severe abdominal pain etc  I recommend repeat  Upper endoscopy in 6-8 months to evaluate that area again and the base of the area where the coils were seen  Continue taking protonix and avoid NSAIDs  Also I value your feedback , so if you get a survey , please take the time to fill it out and thank you for choosing [facility]  Please call us  at [phone] if you have persistent problems or have questions about your condition that have not been fully answered at this time.  Sincerely,  Sarah Walker Sarah Walker Sarah Besecker, MD Gastroenterology and Hepatology

## 2023-04-21 ENCOUNTER — Encounter (INDEPENDENT_AMBULATORY_CARE_PROVIDER_SITE_OTHER): Payer: Self-pay | Admitting: *Deleted

## 2023-05-02 ENCOUNTER — Telehealth (INDEPENDENT_AMBULATORY_CARE_PROVIDER_SITE_OTHER): Payer: Self-pay | Admitting: *Deleted

## 2023-05-02 NOTE — Telephone Encounter (Signed)
 Patient has a very large peptic ulcer with massive GI bleed . Please reassure patien regarding PPI use and safety of when appropriate indications in question. Most recent studies on PPI therapy that association of symptoms is not equivalent to causation and overall association with for example osteoporosis is weak and based on observational studies. When PPI use is indicated, it is safe to proceed with therapy and titrate dosing/use based on symptom response.

## 2023-05-02 NOTE — Telephone Encounter (Signed)
 Pt called and asked if she was suppose to continue protonix  long term. I let her know you had sent in a year of refills on 03/22/23 so yes she should continue the med and she was concerned about it causing kidney issues and osteoporosis. Last seen 04/06/23 for duodenal ulcer.   [phone]

## 2023-05-02 NOTE — Telephone Encounter (Signed)
 Discussed with patient per Dr. Alita Irwin -  Patient has a very large peptic ulcer with massive GI bleed . Please reassure patien regarding PPI use and safety of when appropriate indications in question. Most recent studies on PPI therapy that association of symptoms is not equivalent to causation and overall association with for example osteoporosis is weak and based on observational studies. When PPI use is indicated, it is safe to proceed with therapy and titrate dosing/use based on symptom response.  Patient verbalized understanding.

## 2023-05-04 ENCOUNTER — Ambulatory Visit (INDEPENDENT_AMBULATORY_CARE_PROVIDER_SITE_OTHER): Admitting: Gastroenterology

## 2023-05-11 DIAGNOSIS — M25562 Pain in left knee: Secondary | ICD-10-CM | POA: Diagnosis not present

## 2023-05-11 DIAGNOSIS — M25512 Pain in left shoulder: Secondary | ICD-10-CM | POA: Diagnosis not present

## 2023-05-31 DIAGNOSIS — M79642 Pain in left hand: Secondary | ICD-10-CM | POA: Diagnosis not present

## 2023-05-31 DIAGNOSIS — E538 Deficiency of other specified B group vitamins: Secondary | ICD-10-CM | POA: Diagnosis not present

## 2023-05-31 DIAGNOSIS — L4050 Arthropathic psoriasis, unspecified: Secondary | ICD-10-CM | POA: Diagnosis not present

## 2023-05-31 DIAGNOSIS — E559 Vitamin D deficiency, unspecified: Secondary | ICD-10-CM | POA: Diagnosis not present

## 2023-05-31 DIAGNOSIS — M79641 Pain in right hand: Secondary | ICD-10-CM | POA: Diagnosis not present

## 2023-06-10 ENCOUNTER — Ambulatory Visit (HOSPITAL_COMMUNITY): Admitting: Occupational Therapy

## 2023-06-10 ENCOUNTER — Encounter (INDEPENDENT_AMBULATORY_CARE_PROVIDER_SITE_OTHER): Payer: Self-pay | Admitting: *Deleted

## 2023-06-23 ENCOUNTER — Ambulatory Visit (HOSPITAL_COMMUNITY): Attending: Orthopedic Surgery | Admitting: Occupational Therapy

## 2023-06-23 ENCOUNTER — Encounter (HOSPITAL_COMMUNITY): Payer: Self-pay | Admitting: Occupational Therapy

## 2023-06-23 ENCOUNTER — Other Ambulatory Visit: Payer: Self-pay

## 2023-06-23 DIAGNOSIS — M25512 Pain in left shoulder: Secondary | ICD-10-CM | POA: Diagnosis not present

## 2023-06-23 DIAGNOSIS — G8929 Other chronic pain: Secondary | ICD-10-CM | POA: Insufficient documentation

## 2023-06-23 DIAGNOSIS — R29898 Other symptoms and signs involving the musculoskeletal system: Secondary | ICD-10-CM | POA: Diagnosis not present

## 2023-06-23 NOTE — Therapy (Signed)
 OUTPATIENT OCCUPATIONAL THERAPY ORTHO EVALUATION  Patient Name: Sarah Walker MRN: [id number] DOB:September 15, 1950, 73 y.o., female Today's Date: 06/23/2023   END OF SESSION:  OT End of Session - 06/23/23 1000     Visit Number 1    Number of Visits 4    Date for OT Re-Evaluation 07/23/23    Authorization Type [employer] Medicare    Authorization Time Period Requesting auth    Progress Note Due on Visit 10    OT Start Time [phone]    OT Stop Time 1002    OT Time Calculation (min) 29 min    Activity Tolerance Patient tolerated treatment well    Behavior During Therapy WFL for tasks assessed/performed          Past Medical History:  Diagnosis Date   Abdominal hernia    Allergic rhinitis    Arthritis    Bone spur of other site    left foot   Dry eyes    Family history of kidney cancer    Family history of prostate cancer    Hypertension    Ovarian cancer (HCC)    Ovarian cancer on right (HCC) 06/17/2011   Peripheral neuropathy 08/26/2011   Chemotherapy-induced   Pulmonary embolism (HCC)    Sinusitis    Past Surgical History:  Procedure Laterality Date   ABDOMINAL HYSTERECTOMY  05/25/2011   [place] bs&o and cancer staging   CHOLECYSTECTOMY  01/05/1988   COLONOSCOPY N/A 09/06/2014   Procedure: COLONOSCOPY;  Surgeon: Ruby Corporal, MD;  [facility];  Service: Endoscopy;  Laterality: N/A;  930 - moved to 9/2 @ 8:25   COLONOSCOPY N/A 04/14/2023   Procedure: COLONOSCOPY;  Surgeon: Hargis Lias, MD;  Location: [facility];  Service: Endoscopy;  Laterality: N/A;  10:00AM;ASA 1-2   ESOPHAGOGASTRODUODENOSCOPY N/A 04/14/2023   Procedure: EGD (ESOPHAGOGASTRODUODENOSCOPY);  Surgeon: Hargis Lias, MD;  Location: [facility];  Service: Endoscopy;  Laterality: N/A;  10:00AM;ASA 1-2   ESOPHAGOGASTRODUODENOSCOPY (EGD) WITH PROPOFOL  N/A 02/10/2023   Procedure: ESOPHAGOGASTRODUODENOSCOPY (EGD) WITH PROPOFOL ;  Surgeon: Hargis Lias, MD;  [facility]: [facility];  Service:  Endoscopy;  Laterality: N/A;   I & D EXTREMITY Left 12/13/2021   Procedure: IRRIGATION AND DEBRIDEMENT ANKLE/ LEG;  Surgeon: Saundra Curl, MD;  Location: [facility] OR;  Service: Orthopedics;  Laterality: Left;   INCISIONAL HERNIA REPAIR  10/15/2014   INCISIONAL HERNIA REPAIR  10/15/2014   Procedure: OPEN INCISIONAL HERNIA REPAIR WITH MESH AND MYOFASCIAL FLAPS;  Surgeon: Sim Dryer, MD;  Location: [facility] OR;  Service: General;;   IR ANGIOGRAM SELECTIVE EACH ADDITIONAL VESSEL  02/11/2023   IR ANGIOGRAM VISCERAL SELECTIVE  02/11/2023   IR EMBO ART  VEN HEMORR LYMPH EXTRAV  INC GUIDE ROADMAPPING  02/11/2023   IR US  GUIDE VASC ACCESS RIGHT  02/11/2023   IVC filter  05/24/2011   PORT-A-CATH REMOVAL Right 11/21/2013   Procedure: MINOR REMOVAL PORT-A-CATH;  Surgeon: Beau Bound, MD;  Location: [facility];  Service: General;  Laterality: Right;   PORTACATH PLACEMENT  06/16/2011   Done at [facility]   ROTATOR CUFF REPAIR Right    SCLEROTHERAPY  02/10/2023   Procedure: Daryle Eon;  Surgeon: Hargis Lias, MD;  Location: [facility];  Service: Endoscopy;;   TONSILLECTOMY     age 25's   TOTAL HIP ARTHROPLASTY Right 08/26/2020   Procedure: TOTAL HIP ARTHROPLASTY ANTERIOR APPROACH;  Surgeon: Saundra Curl, MD;  Location: [facility];  Service: Orthopedics;  Laterality: Right;   TOTAL KNEE ARTHROPLASTY Left 07/20/2022   Procedure: TOTAL KNEE ARTHROPLASTY;  Surgeon: Saundra Curl, MD;  Location: [facility];  Service: Orthopedics;  Laterality: Left;   TUBAL LIGATION  01/05/1979   Patient Active Problem List   Diagnosis Date Noted   Gastritis and gastroduodenitis 04/14/2023   Diarrhea due to malabsorption 04/14/2023   Polyp of hepatic flexure of colon 04/14/2023   Vitamin B12 deficiency 02/16/2023   Gastrointestinal hemorrhage associated with duodenal ulcer 02/16/2023   Colon cancer screening 02/16/2023   Hemorrhage requiring transfusion 02/12/2023   Acute upper GI bleed 02/10/2023   Duodenal ulcer  02/10/2023   Acute GI bleeding 02/10/2023   S/P TKR (total knee replacement) 07/21/2022   Status post left knee replacement 07/20/2022   Leg abscess 12/13/2021   Abscess of left leg 12/12/2021   Cellulitis 12/12/2021   Eczema 12/12/2021   Elevated serum creatinine 12/12/2021   S/P total right hip arthroplasty 08/26/2020   Vaginal atrophy 03/02/2017   History of ovarian cancer 03/02/2017   Chemotherapy-induced neuropathy (HCC) 03/31/2015   Incisional hernia, incarcerated 10/15/2014   Ventral hernia 07/15/2014   Ovarian cancer on right (HCC) 06/17/2011   Hypertension 06/17/2011   H/O hysterectomy with oophorectomy 06/17/2011    PCP: Dr. Minus Amel  REFERRING PROVIDER: Dr. Randal Bury  ONSET DATE: chronic for years  REFERRING DIAG: left shoulder pain  THERAPY DIAG:  Chronic left shoulder pain  Other symptoms and signs involving the musculoskeletal system  Rationale for Evaluation and Treatment: Rehabilitation  SUBJECTIVE:   SUBJECTIVE STATEMENT: S: I have trouble lifting with my arm. Pt accompanied by: self  PERTINENT HISTORY: Pt is a 73 y/o female presenting with left shoulder pain present for several years. Pt has not had any imaging recently on the LUE. Pt had a cortisone shot approximately 1 month ago.   PRECAUTIONS: None  WEIGHT BEARING RESTRICTIONS: No  PAIN:  Are you having pain? No  FALLS: Has patient fallen in last 6 months? No  PLOF: Independent  PATIENT GOALS: To be able to lift the arm better.   NEXT MD VISIT: none scheduled  OBJECTIVE:   HAND DOMINANCE: Right  ADLs: Overall ADLs: Pt reports difficulty with lifting items with the LUE. Does not sleep on the left side due to past aching if she did that. Pt is able to reach and dressing, bath, etc., but has difficulty with meal preparation and lifting items.   FUNCTIONAL OUTCOME MEASURES: Upper Extremity Functional Scale (UEFS): 64/80=80%  UPPER EXTREMITY ROM:       Assessed in  sitting, er/IR adducted  Active ROM Left eval  Shoulder flexion 140  Shoulder abduction 130  Shoulder internal rotation 90  Shoulder external rotation 41  (Blank rows = not tested)    UPPER EXTREMITY MMT:     Assessed in sitting, er/IR adducted  MMT Left eval  Shoulder flexion 3+/5  Shoulder abduction 3+/5  Shoulder internal rotation 4/5  Shoulder external rotation 4-/5  (Blank rows = not tested)  SENSATION: WFL  EDEMA: None   COGNITION: Overall cognitive status: Within functional limits for tasks assessed  OBSERVATIONS: Min fascial restrictions present   TODAY'S TREATMENT:  DATE:  Eval:  -A/ROM: sitting-protraction, flexion, horizontal abduction, er, abduction, 10 reps    PATIENT EDUCATION: Education details: A/ROM Person educated: Patient Education method: Programmer, multimedia, Facilities manager, and Handouts Education comprehension: verbalized understanding and returned demonstration  HOME EXERCISE PROGRAM: Eval: A/ROM  GOALS: Goals reviewed with patient? Yes   SHORT TERM GOALS: Target date: 07/22/23  Pt will be provided with and educated on HEP to improve mobility in LUE required for use during ADL completion.   Goal status: INITIAL  2.  Pt will increase LUE strength to 4/5 to improve ability to reach for items at waist to chest height during bathing and grooming tasks.   Goal status: INITIAL  3.  Pt will decrease pain in LUE to 3/10 or less to improve ability to sleep for 2+ consecutive hours without waking due to pain.   Goal status: INITIAL  4.  Pt will decrease LUE fascial restrictions to min amounts or less to improve mobility required for functional reaching tasks.   Goal status: INITIAL    ASSESSMENT:  CLINICAL IMPRESSION: Patient is a 73 y.o. female who was seen today for occupational therapy evaluation for LUE. Pt  presents with increased pain and fascial restrictions, decreased ROM, strength, and functional use of the LUE. Pt reports improvement in pain since cortisone shot, however has weakness and difficulty lifting items with the LUE.    PERFORMANCE DEFICITS: in functional skills including in functional skills including ADLs, IADLs, coordination, tone, ROM, strength, pain, fascial restrictions, muscle spasms, and UE functional use  IMPAIRMENTS: are limiting patient from ADLs, IADLs, rest and sleep, and leisure.   COMORBIDITIES: has no other co-morbidities that affects occupational performance. Patient will benefit from skilled OT to address above impairments and improve overall function.  MODIFICATION OR ASSISTANCE TO COMPLETE EVALUATION: No modification of tasks or assist necessary to complete an evaluation.  OT OCCUPATIONAL PROFILE AND HISTORY: Problem focused assessment: Including review of records relating to presenting problem.  CLINICAL DECISION MAKING: LOW - limited treatment options, no task modification necessary  REHAB POTENTIAL: Good  EVALUATION COMPLEXITY: Low      PLAN:  OT FREQUENCY: 1x/week  OT DURATION: 4 weeks  PLANNED INTERVENTIONS: 97168 OT Re-evaluation, 97535 self care/ADL training, 40981 therapeutic exercise, 97530 therapeutic activity, 97112 neuromuscular re-education, 97140 manual therapy, 97035 ultrasound, patient/family education, and DME and/or AE instructions  RECOMMENDED OTHER SERVICES: None at this time  CONSULTED AND AGREED WITH PLAN OF CARE: Patient  PLAN FOR NEXT SESSION: Follow up on HEP, manual techniques, scapular strengthening, functional reaching with light weight   Lafonda Piety, OTR/L  [phone] 06/23/2023, 10:06 AM   [employer] Medicare Auth Request Information  Date of referral: 05/26/23 Referring provider: Dr. Randal Bury Referring diagnosis (ICD 10)? M25.512 Treatment diagnosis (ICD 10)? (if different than referring diagnosis) M25.512,  R29.898  Functional Tool Score: UEFS: 80%  What was this (referring dx) caused by? Ongoing Issue  Lonne Roan of Condition: Chronic (continuous duration > 3 months)   Laterality: Lt  Objective measurements identify impairments when they are compared to normal values, the uninvolved extremity, and prior level of function.  [x]  Yes  []  No  Objective assessment of functional ability: Minimal functional limitations   Briefly describe symptoms: pain, weakness, decreased functional use of LUE  How did symptoms start: unsure  Average pain intensity:  Last 24 hours: 1/10  Past week: 3/10  How often does the pt experience symptoms? Frequently  How much have the symptoms interfered with usual daily activities? A little bit  How has condition changed since care began at this facility? NA - initial visit  In general, how is the patients overall health? Good   BACK PAIN (STarT Back Screening Tool) No

## 2023-06-23 NOTE — Patient Instructions (Signed)
 Repeat all exercises 10-15 times, 1-2 times per day.  1) Shoulder Protraction    Begin with elbows by your side, slowly "punch" straight out in front of you.      2) Shoulder Flexion Standing:         Begin with arms at your side with thumbs pointed up, slowly raise both arms up and forward towards overhead.               3) Horizontal abduction/adduction  Standing:           Begin with arms straight out in front of you, bring out to the side in at "T" shape. Keep arms straight entire time.                 4) Internal & External Rotation  Standing:     Stand with elbows at the side and elbows bent 90 degrees. Move your forearms away from your body, then bring back inward toward the body.     5) Shoulder Abduction  Standing:       Lying on your back begin with your arms flat on the table next to your side. Slowly move your arms out to the side so that they go overhead, in a jumping jack or snow angel movement.

## 2023-06-28 ENCOUNTER — Ambulatory Visit (HOSPITAL_COMMUNITY): Admitting: Occupational Therapy

## 2023-06-28 DIAGNOSIS — R29898 Other symptoms and signs involving the musculoskeletal system: Secondary | ICD-10-CM

## 2023-06-28 DIAGNOSIS — G8929 Other chronic pain: Secondary | ICD-10-CM

## 2023-06-28 DIAGNOSIS — M25512 Pain in left shoulder: Secondary | ICD-10-CM | POA: Diagnosis not present

## 2023-06-28 NOTE — Therapy (Signed)
 OUTPATIENT OCCUPATIONAL THERAPY ORTHO TREATMENT NOTE  Patient Name: Sarah Walker MRN: [id number] DOB:1950-09-22, 73 y.o., female Today's Date: 06/28/2023   END OF SESSION:  OT End of Session - 06/28/23 1033     Visit Number 2    Number of Visits 4    Date for OT Re-Evaluation 07/23/23    Authorization Type [employer] Medicare    Authorization Time Period Requesting auth    Progress Note Due on Visit 10    OT Start Time [phone]    OT Stop Time 0932    OT Time Calculation (min) 43 min    Activity Tolerance Patient tolerated treatment well    Behavior During Therapy WFL for tasks assessed/performed           Past Medical History:  Diagnosis Date   Abdominal hernia    Allergic rhinitis    Arthritis    Bone spur of other site    left foot   Dry eyes    Family history of kidney cancer    Family history of prostate cancer    Hypertension    Ovarian cancer (HCC)    Ovarian cancer on right (HCC) 06/17/2011   Peripheral neuropathy 08/26/2011   Chemotherapy-induced   Pulmonary embolism (HCC)    Sinusitis    Past Surgical History:  Procedure Laterality Date   ABDOMINAL HYSTERECTOMY  05/25/2011   [place] bs&o and cancer staging   CHOLECYSTECTOMY  01/05/1988   COLONOSCOPY N/A 09/06/2014   Procedure: COLONOSCOPY;  Surgeon: Claudis RAYMOND Rivet, MD;  [facility];  Service: Endoscopy;  Laterality: N/A;  930 - moved to 9/2 @ 8:25   COLONOSCOPY N/A 04/14/2023   Procedure: COLONOSCOPY;  Surgeon: Cinderella Deatrice FALCON, MD;  [facility];  Service: Endoscopy;  Laterality: N/A;  10:00AM;ASA 1-2   ESOPHAGOGASTRODUODENOSCOPY N/A 04/14/2023   Procedure: EGD (ESOPHAGOGASTRODUODENOSCOPY);  Surgeon: Cinderella Deatrice FALCON, MD;  [facility];  Service: Endoscopy;  Laterality: N/A;  10:00AM;ASA 1-2   ESOPHAGOGASTRODUODENOSCOPY (EGD) WITH PROPOFOL  N/A 02/10/2023   Procedure: ESOPHAGOGASTRODUODENOSCOPY (EGD) WITH PROPOFOL ;  Surgeon: Cinderella Deatrice FALCON, MD;  [facility];   Service: Endoscopy;  Laterality: N/A;   I & D EXTREMITY Left 12/13/2021   Procedure: IRRIGATION AND DEBRIDEMENT ANKLE/ LEG;  Surgeon: Beverley Evalene BIRCH, MD;  Location: [facility];  Service: Orthopedics;  Laterality: Left;   INCISIONAL HERNIA REPAIR  10/15/2014   INCISIONAL HERNIA REPAIR  10/15/2014   Procedure: OPEN INCISIONAL HERNIA REPAIR WITH MESH AND MYOFASCIAL FLAPS;  Surgeon: Debby Shipper, MD;  Location: [facility];  Service: General;;   IR ANGIOGRAM SELECTIVE EACH ADDITIONAL VESSEL  02/11/2023   IR ANGIOGRAM VISCERAL SELECTIVE  02/11/2023   IR EMBO ART  VEN HEMORR LYMPH EXTRAV  INC GUIDE ROADMAPPING  02/11/2023   IR US  GUIDE VASC ACCESS RIGHT  02/11/2023   IVC filter  05/24/2011   PORT-A-CATH REMOVAL Right 11/21/2013   Procedure: MINOR REMOVAL PORT-A-CATH;  Surgeon: Oneil DELENA Budge, MD;  Location: [facility];  Service: General;  Laterality: Right;   PORTACATH PLACEMENT  06/16/2011   Done at [facility]   ROTATOR CUFF REPAIR Right    SCLEROTHERAPY  02/10/2023   Procedure: MATIAS;  Surgeon: Cinderella Deatrice FALCON, MD;  [facility];  Service: Endoscopy;;   TONSILLECTOMY     age 40's   TOTAL HIP ARTHROPLASTY Right 08/26/2020   Procedure: TOTAL HIP ARTHROPLASTY ANTERIOR APPROACH;  Surgeon: Beverley Evalene BIRCH, MD;  Location: [facility];  Service:  Orthopedics;  Laterality: Right;   TOTAL KNEE ARTHROPLASTY Left 07/20/2022   Procedure: TOTAL KNEE ARTHROPLASTY;  Surgeon: Beverley Evalene BIRCH, MD;  [facility];  Service: Orthopedics;  Laterality: Left;   TUBAL LIGATION  01/05/1979   Patient Active Problem List   Diagnosis Date Noted   Gastritis and gastroduodenitis 04/14/2023   Diarrhea due to malabsorption 04/14/2023   Polyp of hepatic flexure of colon 04/14/2023   Vitamin B12 deficiency 02/16/2023   Gastrointestinal hemorrhage associated with duodenal ulcer 02/16/2023   Colon cancer screening 02/16/2023   Hemorrhage requiring transfusion 02/12/2023   Acute upper GI bleed 02/10/2023    Duodenal ulcer 02/10/2023   Acute GI bleeding 02/10/2023   S/P TKR (total knee replacement) 07/21/2022   Status post left knee replacement 07/20/2022   Leg abscess 12/13/2021   Abscess of left leg 12/12/2021   Cellulitis 12/12/2021   Eczema 12/12/2021   Elevated serum creatinine 12/12/2021   S/P total right hip arthroplasty 08/26/2020   Vaginal atrophy 03/02/2017   History of ovarian cancer 03/02/2017   Chemotherapy-induced neuropathy (HCC) 03/31/2015   Incisional hernia, incarcerated 10/15/2014   Ventral hernia 07/15/2014   Ovarian cancer on right (HCC) 06/17/2011   Hypertension 06/17/2011   H/O hysterectomy with oophorectomy 06/17/2011    PCP: Dr. Norleen General  REFERRING PROVIDER: Dr. Evalene Beverley  ONSET DATE: chronic for years  REFERRING DIAG: left shoulder pain  THERAPY DIAG:  Chronic left shoulder pain  Other symptoms and signs involving the musculoskeletal system  Rationale for Evaluation and Treatment: Rehabilitation  SUBJECTIVE:   SUBJECTIVE STATEMENT: S: I had a few days where I hurt too much to do my exercises. Pt accompanied by: self  PERTINENT HISTORY: Pt is a 73 y/o female presenting with left shoulder pain present for several years. Pt has not had any imaging recently on the LUE. Pt had a cortisone shot approximately 1 month ago.   PRECAUTIONS: None  WEIGHT BEARING RESTRICTIONS: No  PAIN:  Are you having pain? No  FALLS: Has patient fallen in last 6 months? No  PLOF: Independent  PATIENT GOALS: To be able to lift the arm better.   NEXT MD VISIT: none scheduled  OBJECTIVE:   HAND DOMINANCE: Right  ADLs: Overall ADLs: Pt reports difficulty with lifting items with the LUE. Does not sleep on the left side due to past aching if she did that. Pt is able to reach and dressing, bath, etc., but has difficulty with meal preparation and lifting items.   FUNCTIONAL OUTCOME MEASURES: Upper Extremity Functional Scale (UEFS): 64/80=80%  UPPER  EXTREMITY ROM:       Assessed in sitting, er/IR adducted  Active ROM Left eval  Shoulder flexion 140  Shoulder abduction 130  Shoulder internal rotation 90  Shoulder external rotation 41  (Blank rows = not tested)    UPPER EXTREMITY MMT:     Assessed in sitting, er/IR adducted  MMT Left eval  Shoulder flexion 3+/5  Shoulder abduction 3+/5  Shoulder internal rotation 4/5  Shoulder external rotation 4-/5  (Blank rows = not tested)  SENSATION: WFL  EDEMA: None   COGNITION: Overall cognitive status: Within functional limits for tasks assessed  OBSERVATIONS: Min fascial restrictions present   TODAY'S TREATMENT:  DATE:  06/28/23 -Manual Therapy: myofascial release and trigger point applied to biceps, trapezius, and scapular region in order to reduce pain and fascial restrictions, as well as improve ROM.  -A/ROM: supine, flexion, abduction, protraction, horizontal abduction, er/IR, x10 -Proximal Shoulder Exercises: paddles, criss cross, circles both directions, x10 each -A/ROM: seated, flexion, abduction, protraction, horizontal abduction, er/IR, x10 -Functional Reaching: 10 cones, counter to 1st shelf, counter to 2nd shelf, 2# counter to 1st shelf, 1# counter to 2nd shelf  Eval:  -A/ROM: sitting-protraction, flexion, horizontal abduction, er, abduction, 10 reps    PATIENT EDUCATION: Education details: A/ROM Person educated: Patient Education method: Programmer, multimedia, Facilities manager, and Handouts Education comprehension: verbalized understanding and returned demonstration  HOME EXERCISE PROGRAM: Eval: A/ROM  GOALS: Goals reviewed with patient? Yes   SHORT TERM GOALS: Target date: 07/22/23  Pt will be provided with and educated on HEP to improve mobility in LUE required for use during ADL completion.   Goal status: IN PROGRESS  2.  Pt will  increase LUE strength to 4/5 to improve ability to reach for items at waist to chest height during bathing and grooming tasks.   Goal status: IN PROGRESS  3.  Pt will decrease pain in LUE to 3/10 or less to improve ability to sleep for 2+ consecutive hours without waking due to pain.   Goal status: IN PROGRESS  4.  Pt will decrease LUE fascial restrictions to min amounts or less to improve mobility required for functional reaching tasks.   Goal status: IN PROGRESS    ASSESSMENT:  CLINICAL IMPRESSION: This session pt presents to OT with minimal pain, stating that she feels like the exercises are helping. She tolerated manual therapy well and feels that it helped her to loosen up and move better. In supine pt had full ROM, however once in sitting against gravity she demonstrated increased difficulty and fatigue. OT added functional reaching this session for endurance and functional related activities at home that she reports difficulty with. Verbal and tactile cuing provided for positioning and technique throughout session.     PERFORMANCE DEFICITS: in functional skills including in functional skills including ADLs, IADLs, coordination, tone, ROM, strength, pain, fascial restrictions, muscle spasms, and UE functional use   PLAN:  OT FREQUENCY: 1x/week  OT DURATION: 4 weeks  PLANNED INTERVENTIONS: 97168 OT Re-evaluation, 97535 self care/ADL training, 02889 therapeutic exercise, 97530 therapeutic activity, 97112 neuromuscular re-education, 97140 manual therapy, 97035 ultrasound, patient/family education, and DME and/or AE instructions  RECOMMENDED OTHER SERVICES: None at this time  CONSULTED AND AGREED WITH PLAN OF CARE: Patient  PLAN FOR NEXT SESSION: Follow up on HEP, manual techniques, scapular strengthening, functional reaching with light weight   Valentin Nightingale, OTR/L [phone] 06/28/2023, 10:34 AM   [facility] Medicare Auth Request Information  Date of referral:  05/26/23 Referring provider: Dr. Evalene Chancy Referring diagnosis (ICD 10)? M25.512 Treatment diagnosis (ICD 10)? (if different than referring diagnosis) M25.512, R29.898  Functional Tool Score: UEFS: 80%  What was this (referring dx) caused by? Ongoing Issue  Lysle of Condition: Chronic (continuous duration > 3 months)   Laterality: Lt  Objective measurements identify impairments when they are compared to normal values, the uninvolved extremity, and prior level of function.  [x]  Yes  []  No  Objective assessment of functional ability: Minimal functional limitations   Briefly describe symptoms: pain, weakness, decreased functional use of LUE  How did symptoms start: unsure  Average pain intensity:  Last 24 hours: 1/10  Past week: 3/10  How often  does the pt experience symptoms? Frequently  How much have the symptoms interfered with usual daily activities? A little bit  How has condition changed since care began at this facility? NA - initial visit  In general, how is the patients overall health? Good   BACK PAIN (STarT Back Screening Tool) No

## 2023-07-05 ENCOUNTER — Ambulatory Visit (HOSPITAL_COMMUNITY): Attending: Orthopedic Surgery | Admitting: Occupational Therapy

## 2023-07-05 ENCOUNTER — Encounter (HOSPITAL_COMMUNITY): Payer: Self-pay | Admitting: Occupational Therapy

## 2023-07-05 DIAGNOSIS — G8929 Other chronic pain: Secondary | ICD-10-CM | POA: Insufficient documentation

## 2023-07-05 DIAGNOSIS — M25512 Pain in left shoulder: Secondary | ICD-10-CM | POA: Diagnosis not present

## 2023-07-05 DIAGNOSIS — R29898 Other symptoms and signs involving the musculoskeletal system: Secondary | ICD-10-CM | POA: Diagnosis not present

## 2023-07-05 NOTE — Therapy (Signed)
 OUTPATIENT OCCUPATIONAL THERAPY ORTHO TREATMENT NOTE  Patient Name: Sarah Walker MRN: [id number] DOB:May 26, 1950, 73 y.o., female Today's Date: 07/05/2023   END OF SESSION:  OT End of Session - 07/05/23 1030     Visit Number 3    Number of Visits 4    Date for OT Re-Evaluation 07/23/23    Authorization Type [employer] Medicare    Authorization - Visit Number 3    Authorization - Number of Visits 6    Progress Note Due on Visit 10    OT Start Time [phone]    OT Stop Time 1016    OT Time Calculation (min) 38 min    Activity Tolerance Patient tolerated treatment well    Behavior During Therapy WFL for tasks assessed/performed            Past Medical History:  Diagnosis Date   Abdominal hernia    Allergic rhinitis    Arthritis    Bone spur of other site    left foot   Dry eyes    Family history of kidney cancer    Family history of prostate cancer    Hypertension    Ovarian cancer (HCC)    Ovarian cancer on right (HCC) 06/17/2011   Peripheral neuropathy 08/26/2011   Chemotherapy-induced   Pulmonary embolism (HCC)    Sinusitis    Past Surgical History:  Procedure Laterality Date   ABDOMINAL HYSTERECTOMY  05/25/2011   [place] bs&o and cancer staging   CHOLECYSTECTOMY  01/05/1988   COLONOSCOPY N/A 09/06/2014   Procedure: COLONOSCOPY;  Surgeon: Claudis RAYMOND Rivet, MD;  [facility];  Service: Endoscopy;  Laterality: N/A;  930 - moved to 9/2 @ 8:25   COLONOSCOPY N/A 04/14/2023   Procedure: COLONOSCOPY;  Surgeon: Cinderella Deatrice FALCON, MD;  [facility];  Service: Endoscopy;  Laterality: N/A;  10:00AM;ASA 1-2   ESOPHAGOGASTRODUODENOSCOPY N/A 04/14/2023   Procedure: EGD (ESOPHAGOGASTRODUODENOSCOPY);  Surgeon: Cinderella Deatrice FALCON, MD;  [facility];  Service: Endoscopy;  Laterality: N/A;  10:00AM;ASA 1-2   ESOPHAGOGASTRODUODENOSCOPY (EGD) WITH PROPOFOL  N/A 02/10/2023   Procedure: ESOPHAGOGASTRODUODENOSCOPY (EGD) WITH PROPOFOL ;  Surgeon: Cinderella Deatrice FALCON, MD;   [facility];  Service: Endoscopy;  Laterality: N/A;   I & D EXTREMITY Left 12/13/2021   Procedure: IRRIGATION AND DEBRIDEMENT ANKLE/ LEG;  Surgeon: Beverley Evalene BIRCH, MD;  [facility]: [facility];  Service: Orthopedics;  Laterality: Left;   INCISIONAL HERNIA REPAIR  10/15/2014   INCISIONAL HERNIA REPAIR  10/15/2014   Procedure: OPEN INCISIONAL HERNIA REPAIR WITH MESH AND MYOFASCIAL FLAPS;  Surgeon: Debby Shipper, MD;  Location: [facility];  Service: General;;   IR ANGIOGRAM SELECTIVE EACH ADDITIONAL VESSEL  02/11/2023   IR ANGIOGRAM VISCERAL SELECTIVE  02/11/2023   IR EMBO ART  VEN HEMORR LYMPH EXTRAV  INC GUIDE ROADMAPPING  02/11/2023   IR US  GUIDE VASC ACCESS RIGHT  02/11/2023   IVC filter  05/24/2011   PORT-A-CATH REMOVAL Right 11/21/2013   Procedure: MINOR REMOVAL PORT-A-CATH;  Surgeon: Oneil DELENA Budge, MD;  Location: [facility];  Service: General;  Laterality: Right;   PORTACATH PLACEMENT  06/16/2011   Done at [facility]   ROTATOR CUFF REPAIR Right    SCLEROTHERAPY  02/10/2023   Procedure: MATIAS;  Surgeon: Cinderella Deatrice FALCON, MD;  [facility];  Service: Endoscopy;;   TONSILLECTOMY     age 61's   TOTAL HIP ARTHROPLASTY Right 08/26/2020   Procedure: TOTAL HIP ARTHROPLASTY ANTERIOR APPROACH;  Surgeon:  Beverley Evalene BIRCH, MD;  Location: [facility];  Service: Orthopedics;  Laterality: Right;   TOTAL KNEE ARTHROPLASTY Left 07/20/2022   Procedure: TOTAL KNEE ARTHROPLASTY;  Surgeon: Beverley Evalene BIRCH, MD;  [facility];  Service: Orthopedics;  Laterality: Left;   TUBAL LIGATION  01/05/1979   Patient Active Problem List   Diagnosis Date Noted   Gastritis and gastroduodenitis 04/14/2023   Diarrhea due to malabsorption 04/14/2023   Polyp of hepatic flexure of colon 04/14/2023   Vitamin B12 deficiency 02/16/2023   Gastrointestinal hemorrhage associated with duodenal ulcer 02/16/2023   Colon cancer screening 02/16/2023   Hemorrhage requiring transfusion 02/12/2023   Acute upper GI  bleed 02/10/2023   Duodenal ulcer 02/10/2023   Acute GI bleeding 02/10/2023   S/P TKR (total knee replacement) 07/21/2022   Status post left knee replacement 07/20/2022   Leg abscess 12/13/2021   Abscess of left leg 12/12/2021   Cellulitis 12/12/2021   Eczema 12/12/2021   Elevated serum creatinine 12/12/2021   S/P total right hip arthroplasty 08/26/2020   Vaginal atrophy 03/02/2017   History of ovarian cancer 03/02/2017   Chemotherapy-induced neuropathy (HCC) 03/31/2015   Incisional hernia, incarcerated 10/15/2014   Ventral hernia 07/15/2014   Ovarian cancer on right (HCC) 06/17/2011   Hypertension 06/17/2011   H/O hysterectomy with oophorectomy 06/17/2011    PCP: Dr. Norleen General  REFERRING PROVIDER: Dr. Evalene Beverley  ONSET DATE: chronic for years  REFERRING DIAG: left shoulder pain  THERAPY DIAG:  Other symptoms and signs involving the musculoskeletal system  Chronic left shoulder pain  Rationale for Evaluation and Treatment: Rehabilitation  SUBJECTIVE:   SUBJECTIVE STATEMENT: S: I had a few days where I hurt too much to do my exercises. Pt accompanied by: self  PERTINENT HISTORY: Pt is a 73 y/o female presenting with left shoulder pain present for several years. Pt has not had any imaging recently on the LUE. Pt had a cortisone shot approximately 1 month ago.   PRECAUTIONS: None  WEIGHT BEARING RESTRICTIONS: No  PAIN:  Are you having pain? No  FALLS: Has patient fallen in last 6 months? No  PLOF: Independent  PATIENT GOALS: To be able to lift the arm better.   NEXT MD VISIT: none scheduled  OBJECTIVE:   HAND DOMINANCE: Right  ADLs: Overall ADLs: Pt reports difficulty with lifting items with the LUE. Does not sleep on the left side due to past aching if she did that. Pt is able to reach and dressing, bath, etc., but has difficulty with meal preparation and lifting items.   FUNCTIONAL OUTCOME MEASURES: Upper Extremity Functional Scale (UEFS):  64/80=80%  UPPER EXTREMITY ROM:       Assessed in sitting, er/IR adducted  Active ROM Left eval  Shoulder flexion 140  Shoulder abduction 130  Shoulder internal rotation 90  Shoulder external rotation 41  (Blank rows = not tested)    UPPER EXTREMITY MMT:     Assessed in sitting, er/IR adducted  MMT Left eval  Shoulder flexion 3+/5  Shoulder abduction 3+/5  Shoulder internal rotation 4/5  Shoulder external rotation 4-/5  (Blank rows = not tested)  SENSATION: WFL  EDEMA: None   COGNITION: Overall cognitive status: Within functional limits for tasks assessed  OBSERVATIONS: Min fascial restrictions present   TODAY'S TREATMENT:  DATE:  07/05/23 -Manual Therapy: myofascial release and trigger point applied to biceps, trapezius, and scapular region in order to reduce pain and fascial restrictions, as well as improve ROM.  -A/ROM: supine, flexion, abduction, protraction, horizontal abduction, er/IR, x10 -Strengthening: 1#, supine, flexion, abduction, protraction, horizontal abduction, er/IR, x10 -Proximal Shoulder Exercises: paddles, criss cross, circles both directions, x10 each -Scapular Strengthening: red band, extension, retraction, rows, x10  06/28/23 -Manual Therapy: myofascial release and trigger point applied to biceps, trapezius, and scapular region in order to reduce pain and fascial restrictions, as well as improve ROM.  -A/ROM: supine, flexion, abduction, protraction, horizontal abduction, er/IR, x10 -Proximal Shoulder Exercises: paddles, criss cross, circles both directions, x10 each -A/ROM: seated, flexion, abduction, protraction, horizontal abduction, er/IR, x10 -Functional Reaching: 10 cones, counter to 1st shelf, counter to 2nd shelf, 2# counter to 1st shelf, 1# counter to 2nd shelf  Eval:  -A/ROM: sitting-protraction, flexion,  horizontal abduction, er, abduction, 10 reps    PATIENT EDUCATION: Education details: Publishing rights manager Person educated: Patient Education method: Explanation, Demonstration, and Handouts Education comprehension: verbalized understanding and returned demonstration  HOME EXERCISE PROGRAM: Eval: A/ROM 7/1: Scapular strengthening  GOALS: Goals reviewed with patient? Yes   SHORT TERM GOALS: Target date: 07/22/23  Pt will be provided with and educated on HEP to improve mobility in LUE required for use during ADL completion.   Goal status: IN PROGRESS  2.  Pt will increase LUE strength to 4/5 to improve ability to reach for items at waist to chest height during bathing and grooming tasks.   Goal status: IN PROGRESS  3.  Pt will decrease pain in LUE to 3/10 or less to improve ability to sleep for 2+ consecutive hours without waking due to pain.   Goal status: IN PROGRESS  4.  Pt will decrease LUE fascial restrictions to min amounts or less to improve mobility required for functional reaching tasks.   Goal status: IN PROGRESS    ASSESSMENT:  CLINICAL IMPRESSION: Pt continuing to tolerate therapy well. She was able to start light weight ROM, as well as scapular strengthening with theraband this session. Overall ROM continues to be full and pt only reporting mild soreness in the scapular region and biceps. With scapular strengthening, she reported that it actually made her arm feel better. OT providing verbal and tactile cuing throughout session for positioning and technique.     PERFORMANCE DEFICITS: in functional skills including in functional skills including ADLs, IADLs, coordination, tone, ROM, strength, pain, fascial restrictions, muscle spasms, and UE functional use   PLAN:  OT FREQUENCY: 1x/week  OT DURATION: 4 weeks  PLANNED INTERVENTIONS: 97168 OT Re-evaluation, 97535 self care/ADL training, 02889 therapeutic exercise, 97530 therapeutic activity, 97112  neuromuscular re-education, 97140 manual therapy, 97035 ultrasound, patient/family education, and DME and/or AE instructions  RECOMMENDED OTHER SERVICES: None at this time  CONSULTED AND AGREED WITH PLAN OF CARE: Patient  PLAN FOR NEXT SESSION: Follow up on HEP, manual techniques, scapular strengthening, functional reaching with light weight   Valentin Nightingale, OTR/L [phone] 07/05/2023, 10:31 AM   [facility] Medicare Auth Request Information  Date of referral: 05/26/23 Referring provider: Dr. Evalene Chancy Referring diagnosis (ICD 10)? M25.512 Treatment diagnosis (ICD 10)? (if different than referring diagnosis) M25.512, R29.898  Functional Tool Score: UEFS: 80%  What was this (referring dx) caused by? Ongoing Issue  Lysle of Condition: Chronic (continuous duration > 3 months)   Laterality: Lt  Objective measurements identify impairments when they are compared to normal values, the uninvolved  extremity, and prior level of function.  [x]  Yes  []  No  Objective assessment of functional ability: Minimal functional limitations   Briefly describe symptoms: pain, weakness, decreased functional use of LUE  How did symptoms start: unsure  Average pain intensity:  Last 24 hours: 1/10  Past week: 3/10  How often does the pt experience symptoms? Frequently  How much have the symptoms interfered with usual daily activities? A little bit  How has condition changed since care began at this facility? NA - initial visit  In general, how is the patients overall health? Good   BACK PAIN (STarT Back Screening Tool) No

## 2023-07-05 NOTE — Patient Instructions (Signed)

## 2023-07-11 DIAGNOSIS — J302 Other seasonal allergic rhinitis: Secondary | ICD-10-CM | POA: Diagnosis not present

## 2023-07-11 DIAGNOSIS — I10 Essential (primary) hypertension: Secondary | ICD-10-CM | POA: Diagnosis not present

## 2023-07-11 DIAGNOSIS — Z79899 Other long term (current) drug therapy: Secondary | ICD-10-CM | POA: Diagnosis not present

## 2023-07-11 DIAGNOSIS — Z7689 Persons encountering health services in other specified circumstances: Secondary | ICD-10-CM | POA: Diagnosis not present

## 2023-07-11 DIAGNOSIS — L4050 Arthropathic psoriasis, unspecified: Secondary | ICD-10-CM | POA: Diagnosis not present

## 2023-07-11 DIAGNOSIS — M792 Neuralgia and neuritis, unspecified: Secondary | ICD-10-CM | POA: Diagnosis not present

## 2023-07-12 ENCOUNTER — Encounter (HOSPITAL_COMMUNITY): Payer: Self-pay | Admitting: Occupational Therapy

## 2023-07-12 ENCOUNTER — Ambulatory Visit (HOSPITAL_COMMUNITY): Admitting: Occupational Therapy

## 2023-07-12 DIAGNOSIS — R29898 Other symptoms and signs involving the musculoskeletal system: Secondary | ICD-10-CM | POA: Diagnosis not present

## 2023-07-12 DIAGNOSIS — G8929 Other chronic pain: Secondary | ICD-10-CM

## 2023-07-12 DIAGNOSIS — M25512 Pain in left shoulder: Secondary | ICD-10-CM | POA: Diagnosis not present

## 2023-07-12 NOTE — Therapy (Signed)
 OUTPATIENT OCCUPATIONAL THERAPY ORTHO TREATMENT NOTE DISCHARGE NOTE  Patient Name: Sarah Walker MRN: [id number] DOB:Aug 31, 1950, 73 y.o., female Today's Date: 07/12/2023  OCCUPATIONAL THERAPY DISCHARGE SUMMARY  Visits from Start of Care: 4  Current functional level related to goals / functional outcomes: Pt has met 3 of 4 OT goals. Her pain and fascial restrictions are minimal and she has been provided a comprehensive HEP.    Remaining deficits: Pt continues to have increased weakness in her shoulder.   Education / Equipment: Pt provided comprehensive HEP and therabands.    Plan: Patient requests discharge as she feels that she can continue exercising it at home.      END OF SESSION:  OT End of Session - 07/12/23 1005     Visit Number 4    Number of Visits 4    Date for OT Re-Evaluation 07/23/23    Authorization Type [employer] Medicare    Authorization - Visit Number 4    Authorization - Number of Visits 6    Progress Note Due on Visit 10    OT Start Time [phone]    OT Stop Time 1005    OT Time Calculation (min) 34 min    Activity Tolerance Patient tolerated treatment well    Behavior During Therapy WFL for tasks assessed/performed             Past Medical History:  Diagnosis Date   Abdominal hernia    Allergic rhinitis    Arthritis    Bone spur of other site    left foot   Dry eyes    Family history of kidney cancer    Family history of prostate cancer    Hypertension    Ovarian cancer (HCC)    Ovarian cancer on right (HCC) 06/17/2011   Peripheral neuropathy 08/26/2011   Chemotherapy-induced   Pulmonary embolism (HCC)    Sinusitis    Past Surgical History:  Procedure Laterality Date   ABDOMINAL HYSTERECTOMY  05/25/2011   [place] bs&[facility] and cancer staging   CHOLECYSTECTOMY  01/05/1988   COLONOSCOPY N/A 09/06/2014   Procedure: COLONOSCOPY;  Surgeon: Claudis RAYMOND Rivet, MD;  [facility];  Service: Endoscopy;  Laterality: N/A;  930 - moved to 9/2 @ 8:25    COLONOSCOPY N/A 04/14/2023   Procedure: COLONOSCOPY;  Surgeon: Cinderella Deatrice FALCON, MD;  [facility]: [facility];  Service: Endoscopy;  Laterality: N/A;  10:00AM;ASA 1-2   ESOPHAGOGASTRODUODENOSCOPY N/A 04/14/2023   Procedure: EGD (ESOPHAGOGASTRODUODENOSCOPY);  Surgeon: Cinderella Deatrice FALCON, MD;  [facility]: [facility];  Service: Endoscopy;  Laterality: N/A;  10:00AM;ASA 1-2   ESOPHAGOGASTRODUODENOSCOPY (EGD) WITH PROPOFOL  N/A 02/10/2023   Procedure: ESOPHAGOGASTRODUODENOSCOPY (EGD) WITH PROPOFOL ;  Surgeon: Cinderella Deatrice FALCON, MD;  [facility];  Service: Endoscopy;  Laterality: N/A;   I & D EXTREMITY Left 12/13/2021   Procedure: IRRIGATION AND DEBRIDEMENT ANKLE/ LEG;  Surgeon: Beverley Evalene BIRCH, MD;  Location: [facility];  Service: Orthopedics;  Laterality: Left;   INCISIONAL HERNIA REPAIR  10/15/2014   INCISIONAL HERNIA REPAIR  10/15/2014   Procedure: OPEN INCISIONAL HERNIA REPAIR WITH MESH AND MYOFASCIAL FLAPS;  Surgeon: Debby Shipper, MD;  Location: [facility];  Service: General;;   IR ANGIOGRAM SELECTIVE EACH ADDITIONAL VESSEL  02/11/2023   IR ANGIOGRAM VISCERAL SELECTIVE  02/11/2023   IR EMBO ART  VEN HEMORR LYMPH EXTRAV  INC GUIDE ROADMAPPING  02/11/2023   IR US  GUIDE VASC ACCESS RIGHT  02/11/2023   IVC filter  05/24/2011  PORT-A-CATH REMOVAL Right 11/21/2013   Procedure: MINOR REMOVAL PORT-A-CATH;  Surgeon: Oneil DELENA Budge, MD;  Location: [facility];  Service: General;  Laterality: Right;   PORTACATH PLACEMENT  06/16/2011   Done at [facility]   ROTATOR CUFF REPAIR Right    SCLEROTHERAPY  02/10/2023   Procedure: MATIAS;  Surgeon: Cinderella Deatrice FALCON, MD;  [facility]: [facility];  Service: Endoscopy;;   TONSILLECTOMY     age 60's   TOTAL HIP ARTHROPLASTY Right 08/26/2020   Procedure: TOTAL HIP ARTHROPLASTY ANTERIOR APPROACH;  Surgeon: Beverley Evalene BIRCH, MD;  Location: [facility];  Service: Orthopedics;  Laterality: Right;   TOTAL KNEE ARTHROPLASTY Left 07/20/2022   Procedure: TOTAL KNEE  ARTHROPLASTY;  Surgeon: Beverley Evalene BIRCH, MD;  Location: [facility];  Service: Orthopedics;  Laterality: Left;   TUBAL LIGATION  01/05/1979   Patient Active Problem List   Diagnosis Date Noted   Gastritis and gastroduodenitis 04/14/2023   Diarrhea due to malabsorption 04/14/2023   Polyp of hepatic flexure of colon 04/14/2023   Vitamin B12 deficiency 02/16/2023   Gastrointestinal hemorrhage associated with duodenal ulcer 02/16/2023   Colon cancer screening 02/16/2023   Hemorrhage requiring transfusion 02/12/2023   Acute upper GI bleed 02/10/2023   Duodenal ulcer 02/10/2023   Acute GI bleeding 02/10/2023   S/P TKR (total knee replacement) 07/21/2022   Status post left knee replacement 07/20/2022   Leg abscess 12/13/2021   Abscess of left leg 12/12/2021   Cellulitis 12/12/2021   Eczema 12/12/2021   Elevated serum creatinine 12/12/2021   S/P total right hip arthroplasty 08/26/2020   Vaginal atrophy 03/02/2017   History of ovarian cancer 03/02/2017   Chemotherapy-induced neuropathy (HCC) 03/31/2015   Incisional hernia, incarcerated 10/15/2014   Ventral hernia 07/15/2014   Ovarian cancer on right (HCC) 06/17/2011   Hypertension 06/17/2011   H/O hysterectomy with oophorectomy 06/17/2011    PCP: Dr. Norleen General  REFERRING PROVIDER: Dr. Evalene Beverley  ONSET DATE: chronic for years  REFERRING DIAG: left shoulder pain  THERAPY DIAG:  Other symptoms and signs involving the musculoskeletal system  Chronic left shoulder pain  Rationale for Evaluation and Treatment: Rehabilitation  SUBJECTIVE:   SUBJECTIVE STATEMENT: S: I've been using it a lot. Pt accompanied by: self  PERTINENT HISTORY: Pt is a 73 y/o female presenting with left shoulder pain present for several years. Pt has not had any imaging recently on the LUE. Pt had a cortisone shot approximately 1 month ago.   PRECAUTIONS: None  WEIGHT BEARING RESTRICTIONS: No  PAIN:  Are you having pain? No  FALLS: Has  patient fallen in last 6 months? No  PLOF: Independent  PATIENT GOALS: To be able to lift the arm better.   NEXT MD VISIT: none scheduled  OBJECTIVE:   HAND DOMINANCE: Right  ADLs: Overall ADLs: Pt reports difficulty with lifting items with the LUE. Does not sleep on the left side due to past aching if she did that. Pt is able to reach and dressing, bath, etc., but has difficulty with meal preparation and lifting items.   FUNCTIONAL OUTCOME MEASURES: Upper Extremity Functional Scale (UEFS): 64/80=80% 07/12/23: 22/80 - 27.5%  UPPER EXTREMITY ROM:       Assessed in sitting, er/IR adducted  Active ROM Left eval Left 07/12/23  Shoulder flexion 140 156  Shoulder abduction 130 145  Shoulder internal rotation 90 90  Shoulder external rotation 41 49  (Blank rows = not tested)    UPPER EXTREMITY MMT:  Assessed in sitting, er/IR adducted  MMT Left eval Left 07/12/23  Shoulder flexion 3+/5 4-/5  Shoulder abduction 3+/5 3+/5  Shoulder internal rotation 4/5 4+/5  Shoulder external rotation 4-/5 4-/5  (Blank rows = not tested)  SENSATION: WFL  EDEMA: None   COGNITION: Overall cognitive status: Within functional limits for tasks assessed  OBSERVATIONS: Min fascial restrictions present   TODAY'S TREATMENT:                                                                                                                              DATE:  07/12/23 -A/ROM: seated, flexion, abduction, protraction, horizontal abduction, er/IR, x10 -Scapular Strengthening: red band, extension, retraction, rows, x10 -PNF Strengthening: red band, chest pulls, er pulls, PNF up, PNF down, x10  07/05/23 -Manual Therapy: myofascial release and trigger point applied to biceps, trapezius, and scapular region in order to reduce pain and fascial restrictions, as well as improve ROM.  -A/ROM: supine, flexion, abduction, protraction, horizontal abduction, er/IR, x10 -Strengthening: 1#, supine, flexion,  abduction, protraction, horizontal abduction, er/IR, x10 -Proximal Shoulder Exercises: paddles, criss cross, circles both directions, x10 each -Scapular Strengthening: red band, extension, retraction, rows, x10  06/28/23 -Manual Therapy: myofascial release and trigger point applied to biceps, trapezius, and scapular region in order to reduce pain and fascial restrictions, as well as improve ROM.  -A/ROM: supine, flexion, abduction, protraction, horizontal abduction, er/IR, x10 -Proximal Shoulder Exercises: paddles, criss cross, circles both directions, x10 each -A/ROM: seated, flexion, abduction, protraction, horizontal abduction, er/IR, x10 -Functional Reaching: 10 cones, counter to 1st shelf, counter to 2nd shelf, 2# counter to 1st shelf, 1# counter to 2nd shelf    PATIENT EDUCATION: Education details: PNF Strengthening Person educated: Patient Education method: Programmer, multimedia, Facilities manager, and Handouts Education comprehension: verbalized understanding and returned demonstration  HOME EXERCISE PROGRAM: Eval: A/ROM 7/1: Scapular strengthening 7/8: PNF Strengthening  GOALS: Goals reviewed with patient? Yes   SHORT TERM GOALS: Target date: 07/22/23  Pt will be provided with and educated on HEP to improve mobility in LUE required for use during ADL completion.   Goal status: MET  2.  Pt will increase LUE strength to 4/5 to improve ability to reach for items at waist to chest height during bathing and grooming tasks.   Goal status: NOT MET  3.  Pt will decrease pain in LUE to 3/10 or less to improve ability to sleep for 2+ consecutive hours without waking due to pain.   Goal status: MET  4.  Pt will decrease LUE fascial restrictions to min amounts or less to improve mobility required for functional reaching tasks.   Goal status: MET    ASSESSMENT:  CLINICAL IMPRESSION: Pt completed reassessment for discharge this session. Overall she has improvement with her ROM, pain,  and fascial restrictions. Pt continues to have weakness with all movements, requiring adding to her HEP for continued functional strengthening. Pt requests to discharge from OT, as she feels she can continue  improving at home with HEP.   PERFORMANCE DEFICITS: in functional skills including in functional skills including ADLs, IADLs, coordination, tone, ROM, strength, pain, fascial restrictions, muscle spasms, and UE functional use   PLAN:  OT FREQUENCY: 1x/week  OT DURATION: 4 weeks  PLANNED INTERVENTIONS: 97168 OT Re-evaluation, 97535 self care/ADL training, 02889 therapeutic exercise, 97530 therapeutic activity, 97112 neuromuscular re-education, 97140 manual therapy, 97035 ultrasound, patient/family education, and DME and/or AE instructions  RECOMMENDED OTHER SERVICES: None at this time  CONSULTED AND AGREED WITH PLAN OF CARE: Patient  PLAN FOR NEXT SESSION: Discharge   Valentin Nightingale, OTR/L [phone] 07/12/2023, 11:02 AM   [facility] Medicare Auth Request Information  Date of referral: 05/26/23 Referring provider: Dr. Evalene Chancy Referring diagnosis (ICD 10)? M25.512 Treatment diagnosis (ICD 10)? (if different than referring diagnosis) M25.512, R29.898  Functional Tool Score: UEFS: 80%  What was this (referring dx) caused by? Ongoing Issue  Lysle of Condition: Chronic (continuous duration > 3 months)   Laterality: Lt  Objective measurements identify impairments when they are compared to normal values, the uninvolved extremity, and prior level of function.  [x]  Yes  []  No  Objective assessment of functional ability: Minimal functional limitations   Briefly describe symptoms: pain, weakness, decreased functional use of LUE  How did symptoms start: unsure  Average pain intensity:  Last 24 hours: 1/10  Past week: 3/10  How often does the pt experience symptoms? Frequently  How much have the symptoms interfered with usual daily activities? A little bit  How has  condition changed since care began at this facility? NA - initial visit  In general, how is the patients overall health? Good   BACK PAIN (STarT Back Screening Tool) No

## 2023-07-12 NOTE — Patient Instructions (Signed)

## 2023-07-15 DIAGNOSIS — Z7689 Persons encountering health services in other specified circumstances: Secondary | ICD-10-CM | POA: Diagnosis not present

## 2023-07-15 DIAGNOSIS — I10 Essential (primary) hypertension: Secondary | ICD-10-CM | POA: Diagnosis not present

## 2023-07-19 ENCOUNTER — Encounter (HOSPITAL_COMMUNITY): Admitting: Occupational Therapy

## 2023-07-22 DIAGNOSIS — D359 Benign neoplasm of endocrine gland, unspecified: Secondary | ICD-10-CM | POA: Diagnosis not present

## 2023-07-22 DIAGNOSIS — R252 Cramp and spasm: Secondary | ICD-10-CM | POA: Diagnosis not present

## 2023-07-22 DIAGNOSIS — M792 Neuralgia and neuritis, unspecified: Secondary | ICD-10-CM | POA: Diagnosis not present

## 2023-07-22 DIAGNOSIS — J302 Other seasonal allergic rhinitis: Secondary | ICD-10-CM | POA: Diagnosis not present

## 2023-07-22 DIAGNOSIS — D539 Nutritional anemia, unspecified: Secondary | ICD-10-CM | POA: Diagnosis not present

## 2023-07-22 DIAGNOSIS — Z0001 Encounter for general adult medical examination with abnormal findings: Secondary | ICD-10-CM | POA: Diagnosis not present

## 2023-07-22 DIAGNOSIS — R42 Dizziness and giddiness: Secondary | ICD-10-CM | POA: Diagnosis not present

## 2023-07-22 DIAGNOSIS — L4050 Arthropathic psoriasis, unspecified: Secondary | ICD-10-CM | POA: Diagnosis not present

## 2023-07-22 DIAGNOSIS — I10 Essential (primary) hypertension: Secondary | ICD-10-CM | POA: Diagnosis not present

## 2023-07-26 ENCOUNTER — Telehealth: Payer: Self-pay

## 2023-07-26 NOTE — Telephone Encounter (Signed)
 Who is your primary care physician: Dr. Norleen General    Are you diabetic? If yes, Type 1 or Type 2?    no  Do you have a prosthetic or mechanical heart valve? no  Do you have a pacemaker/defibrillator?   no  Have you had endocarditis/atrial fibrillation? no  Have you had joint replacement within the last 12 months?  Yes 07/21/2022 Total knee                          Do you tend to be constipated or have to use laxatives? no             Do you have any history of drugs or alchohol?  no  Do you use supplemental oxygen?  no  Have you had a stroke or heart attack within the last 6 months? no  Do you take weight loss medication?  no  For female patients: have you had a hysterectomy?  yes                                     are you post menopausal?       no                                            do you still have your menstrual cycle? no      Do you take any blood-thinning medications such as: (aspirin , warfarin, Plavix, Aggrenox)  no  If yes we need the name, milligram, dosage and who is prescribing doctor  Current Outpatient Medications on File Prior to Visit  Medication Sig Dispense Refill   acetaminophen  (TYLENOL ) 500 MG tablet Take 500 mg by mouth every 6 (six) hours as needed.     ALPRAZolam  (XANAX ) 0.5 MG tablet Take 0.5 mg by mouth at bedtime.     [Paused] aspirin  EC 81 MG tablet Take 81 mg by mouth daily. Swallow whole. (Patient not taking: Reported on 04/06/2023)     Bismuth  Subsalicylate (PEPTO-BISMOL PO) Take by mouth.     fluticasone  (FLONASE ) 50 MCG/ACT nasal spray Place 1 spray into both nostrils daily as needed for allergies or rhinitis.     folic acid  (FOLVITE ) 1 MG tablet Take 1 mg by mouth daily.     HYDROcodone -acetaminophen  (NORCO/VICODIN) 5-325 MG tablet Take 1-2 tablets by mouth every 6 (six) hours as needed for moderate pain (pain score 4-6).     KLOR-CON  M20 20 MEQ tablet TAKE 1 TABLET BY MOUTH EVERY DAY (Patient taking differently: Take 20 mEq by mouth  daily.) 90 tablet 1   leptospermum manuka honey (MEDIHONEY) PSTE paste Apply 1 Application topically daily. 30 mL 0   levocetirizine (XYZAL) 5 MG tablet Take 2.5 mg by mouth every evening.     Methotrexate, PF, (RASUVO) 10 MG/0.2ML SOAJ 10mg  sub injection every 30 days     pantoprazole  (PROTONIX ) 40 MG tablet TAKE 1 TABLET BY MOUTH TWICE A DAY 180 tablet 3   sucralfate  (CARAFATE ) 1 GM/10ML suspension Take 10 mLs (1 g total) by mouth 4 (four) times daily -  with meals and at bedtime. (Patient not taking: Reported on 04/06/2023) 420 mL 0   valsartan -hydrochlorothiazide  (DIOVAN -HCT) 320-12.5 MG tablet Take 1 tablet by mouth daily.     vitamin B-12  100 MCG tablet Take 1 tablet (100 mcg total) by mouth daily. 30 tablet 0   Vitamin D, Ergocalciferol, (DRISDOL) 1.25 MG (50000 UNIT) CAPS capsule Take 50,000 Units by mouth every 7 (seven) days.     Current Facility-Administered Medications on File Prior to Visit  Medication Dose Route Frequency Provider Last Rate Last Admin   sodium chloride  0.9 % injection 10 mL  10 mL Intravenous PRN Kefalas, Thomas S, PA-C   10 mL at 08/31/12 1446   sodium chloride  0.9 % injection 10 mL  10 mL Intravenous PRN Lawernce Hacker, MD   10 mL at 09/12/13 1408    Allergies  Allergen Reactions   Erythromycin Itching   Codeine Nausea And Vomiting    Not right away after a couple days of taking     Pharmacy: [facility] [address] [place]   Primary Insurance Name: [facility] [id number], [place] [id number]   Best number where you can be reached: [phone]

## 2023-08-01 NOTE — Telephone Encounter (Signed)
LMOVM to call back to schedule EGD

## 2023-08-01 NOTE — Telephone Encounter (Signed)
 Ok to schedule.  Room any . Please continue protonix    Thanks,  Danamarie Minami Faizan Lenton Gendreau, MD Gastroenterology and Hepatology [facility] Gastroenterology

## 2023-08-02 NOTE — Telephone Encounter (Signed)
 Pt called back and scheduled for 8/14

## 2023-08-16 ENCOUNTER — Encounter (HOSPITAL_COMMUNITY)
Admission: RE | Admit: 2023-08-16 | Discharge: 2023-08-16 | Disposition: A | Discharge status: Home / Self Care | Source: Ambulatory Visit | Attending: Gastroenterology | Admitting: Gastroenterology

## 2023-08-16 ENCOUNTER — Encounter (HOSPITAL_COMMUNITY): Payer: Self-pay

## 2023-08-16 ENCOUNTER — Other Ambulatory Visit: Payer: Self-pay

## 2023-08-17 ENCOUNTER — Ambulatory Visit (HOSPITAL_COMMUNITY)

## 2023-08-18 ENCOUNTER — Encounter (HOSPITAL_COMMUNITY)
Admission: RE | Disposition: A | Discharge status: Home / Self Care | Payer: Self-pay | Source: Home / Self Care | Attending: Gastroenterology

## 2023-08-18 ENCOUNTER — Ambulatory Visit (HOSPITAL_COMMUNITY): Admitting: Anesthesiology

## 2023-08-18 ENCOUNTER — Encounter (HOSPITAL_COMMUNITY): Payer: Self-pay | Admitting: Gastroenterology

## 2023-08-18 ENCOUNTER — Ambulatory Visit (HOSPITAL_BASED_OUTPATIENT_CLINIC_OR_DEPARTMENT_OTHER): Admitting: Anesthesiology

## 2023-08-18 ENCOUNTER — Ambulatory Visit (HOSPITAL_COMMUNITY)
Admission: RE | Admit: 2023-08-18 | Discharge: 2023-08-18 | Disposition: A | Discharge status: Home / Self Care | Attending: Gastroenterology | Admitting: Gastroenterology

## 2023-08-18 DIAGNOSIS — I10 Essential (primary) hypertension: Secondary | ICD-10-CM | POA: Diagnosis not present

## 2023-08-18 DIAGNOSIS — T183XXA Foreign body in small intestine, initial encounter: Secondary | ICD-10-CM

## 2023-08-18 DIAGNOSIS — W448XXA Other foreign body entering into or through a natural orifice, initial encounter: Secondary | ICD-10-CM | POA: Diagnosis not present

## 2023-08-18 DIAGNOSIS — K3189 Other diseases of stomach and duodenum: Secondary | ICD-10-CM | POA: Diagnosis not present

## 2023-08-18 DIAGNOSIS — Z09 Encounter for follow-up examination after completed treatment for conditions other than malignant neoplasm: Secondary | ICD-10-CM | POA: Insufficient documentation

## 2023-08-18 DIAGNOSIS — Z90710 Acquired absence of both cervix and uterus: Secondary | ICD-10-CM | POA: Insufficient documentation

## 2023-08-18 DIAGNOSIS — T182XXA Foreign body in stomach, initial encounter: Secondary | ICD-10-CM

## 2023-08-18 DIAGNOSIS — K2950 Unspecified chronic gastritis without bleeding: Secondary | ICD-10-CM | POA: Insufficient documentation

## 2023-08-18 DIAGNOSIS — Z8543 Personal history of malignant neoplasm of ovary: Secondary | ICD-10-CM | POA: Diagnosis not present

## 2023-08-18 DIAGNOSIS — Z8711 Personal history of peptic ulcer disease: Secondary | ICD-10-CM | POA: Diagnosis not present

## 2023-08-18 DIAGNOSIS — Z9221 Personal history of antineoplastic chemotherapy: Secondary | ICD-10-CM | POA: Insufficient documentation

## 2023-08-18 DIAGNOSIS — K279 Peptic ulcer, site unspecified, unspecified as acute or chronic, without hemorrhage or perforation: Secondary | ICD-10-CM

## 2023-08-18 DIAGNOSIS — M1990 Unspecified osteoarthritis, unspecified site: Secondary | ICD-10-CM | POA: Diagnosis not present

## 2023-08-18 HISTORY — PX: ESOPHAGOGASTRODUODENOSCOPY: SHX5428

## 2023-08-18 SURGERY — EGD (ESOPHAGOGASTRODUODENOSCOPY)
Anesthesia: General

## 2023-08-18 MED ORDER — LACTATED RINGERS IV SOLN: INTRAVENOUS | Status: DC | PRN

## 2023-08-18 MED ORDER — LACTATED RINGERS IV SOLN: INTRAVENOUS | Status: DC

## 2023-08-18 MED ORDER — LIDOCAINE 2% (20 MG/ML) 5 ML SYRINGE
INTRAMUSCULAR | Status: DC | PRN
  Administered 2023-08-18: 80 mg via INTRAVENOUS

## 2023-08-18 MED ORDER — PROPOFOL 10 MG/ML IV BOLUS
INTRAVENOUS | Status: DC | PRN
  Administered 2023-08-18 (×2): 50 mg via INTRAVENOUS

## 2023-08-18 MED ORDER — PROPOFOL 500 MG/50ML IV EMUL
INTRAVENOUS | Status: DC | PRN
  Administered 2023-08-18: 100 ug/kg/min via INTRAVENOUS

## 2023-08-18 NOTE — Anesthesia Preprocedure Evaluation (Signed)
 Anesthesia Evaluation  Patient identified by MRN, date of birth, ID band Patient awake    Reviewed: Allergy & Precautions, H&P , NPO status , Patient's Chart, lab work & pertinent test results, reviewed documented beta blocker date and time   Airway Mallampati: II  TM Distance: >3 FB Neck ROM: full    Dental no notable dental hx. (+) Dental Advisory Given, Teeth Intact   Pulmonary PE   Pulmonary exam normal breath sounds clear to auscultation       Cardiovascular Exercise Tolerance: Good hypertension, Normal cardiovascular exam Rhythm:regular Rate:Normal     Neuro/Psych  Neuromuscular disease  negative psych ROS   GI/Hepatic Neg liver ROS,,,  Endo/Other  negative endocrine ROS    Renal/GU negative Renal ROS  negative genitourinary   Musculoskeletal  (+) Arthritis , Osteoarthritis,    Abdominal   Peds  Hematology  (+) Blood dyscrasia, anemia Hgb 7.6   Anesthesia Other Findings Ovarian cancer  Reproductive/Obstetrics negative OB ROS                              Anesthesia Physical Anesthesia Plan  ASA: 3 and emergent  Anesthesia Plan: General   Post-op Pain Management: Minimal or no pain anticipated   Induction: Intravenous  PONV Risk Score and Plan: Propofol  infusion  Airway Management Planned: Nasal Cannula and Natural Airway  Additional Equipment: None  Intra-op Plan:   Post-operative Plan:   Informed Consent: I have reviewed the patients History and Physical, chart, labs and discussed the procedure including the risks, benefits and alternatives for the proposed anesthesia with the patient or authorized representative who has indicated his/her understanding and acceptance.     Dental Advisory Given  Plan Discussed with: CRNA  Anesthesia Plan Comments:         Anesthesia Quick Evaluation

## 2023-08-18 NOTE — Op Note (Addendum)
 [facility] Patient Name: Sarah Walker Procedure Date: 08/18/2023 10:56 AM MRN: [id number] Date of Birth: 1950/09/06 Attending MD: Deatrice Dine , MD, [id number] CSN: [id number] Age: 73 Admit Type: Outpatient Procedure:                Upper GI endoscopy Indications:              Personal history of peptic ulcer disease Providers:                Deatrice Dine, MD, Devere Lodge, Dorcas Lenis,                            Technician Referring MD:              Medicines:                Monitored Anesthesia Care Complications:            No immediate complications. Estimated Blood Loss:     Estimated blood loss: none. Procedure:                Pre-Anesthesia Assessment:                           - Prior to the procedure, a History and Physical                            was performed, and patient medications and                            allergies were reviewed. The patient's tolerance of                            previous anesthesia was also reviewed. The risks                            and benefits of the procedure and the sedation                            options and risks were discussed with the patient.                            All questions were answered, and informed consent                            was obtained. Prior Anticoagulants: The patient has                            taken no anticoagulant or antiplatelet agents. ASA                            Grade Assessment: II - A patient with mild systemic                            disease. After reviewing the risks and benefits,  the patient was deemed in satisfactory condition to                            undergo the procedure.                           After obtaining informed consent, the endoscope was                            passed under direct vision. Throughout the                            procedure, the patient's blood pressure, pulse, and                            oxygen  saturations were monitored continuously. The                            [id number]-[id number] ([id number]) scope was introduced through the                            mouth, and advanced to the second part of duodenum.                            The upper GI endoscopy was accomplished without                            difficulty. The patient tolerated the procedure                            well. Scope In: 11:14:08 AM Scope Out: 11:18:12 AM Total Procedure Duration: 0 hours 4 minutes 4 seconds  Findings:      The examined esophagus was normal.      Mildly erythematous mucosa without bleeding was found in the gastric       antrum. This was biopsied with a cold jumbo forceps for histology.      A foreign body was found in the duodenal bulb. Impression:               - Normal esophagus.                           - Erythematous mucosa in the antrum. Biopsied.                           - Duodenal foreign body. . This appears to be coils                            from embolization at the same area where previous                            ulcer was seen. No ulcer seen - Normal second                            portion of the  duodenum. - I discussed case with IR                            ( Dr Luverne) ,who adviced to monitor this for now                            and no intervention required. Given patient is                            asymptomatic without any overt bleeding , they                            recommend to monitor this for now . Moderate Sedation:      Per Anesthesia Care Recommendation:           - Patient has a contact number available for                            emergencies. The signs and symptoms of potential                            delayed complications were discussed with the                            patient. Return to normal activities tomorrow.                            Written discharge instructions were provided to the                            patient.                            - Resume previous diet.                           - Continue present medications.                           - Await pathology results.                           -continue PPI                           -I also gave patient option for referral to IR so                            she can speak with them regarding this finding .                            Patient would like to hold on to the referral and                            will reach out to our clinic if she  want to speak                            to IR Procedure Code(s):        --- Professional ---                           [phone], Esophagogastroduodenoscopy, flexible,                            transoral; with biopsy, single or multiple Diagnosis Code(s):        --- Professional ---                           K31.89, Other diseases of stomach and duodenum                           T18.3XXA, Foreign body in small intestine, initial                            encounter                           Z87.11, Personal history of peptic ulcer disease CPT copyright 2022 American Medical Association. All rights reserved. The codes documented in this report are preliminary and upon coder review may  be revised to meet current compliance requirements. Deatrice Dine, MD Deatrice Dine, MD 08/18/2023 11:26:43 AM This report has been signed electronically. Number of Addenda: 0

## 2023-08-18 NOTE — Anesthesia Postprocedure Evaluation (Signed)
 Anesthesia Post Note  Patient: Sarah Walker  Procedure(s) Performed: EGD (ESOPHAGOGASTRODUODENOSCOPY)  Patient location during evaluation: Phase II Anesthesia Type: General Level of consciousness: awake and alert Pain management: pain level controlled Vital Signs Assessment: post-procedure vital signs reviewed and stable Respiratory status: spontaneous breathing, nonlabored ventilation and respiratory function stable Cardiovascular status: blood pressure returned to baseline and stable Postop Assessment: no apparent nausea or vomiting Anesthetic complications: no   There were no known notable events for this encounter.   Last Vitals:  Vitals:   08/18/23 1124 08/18/23 1131  BP: (!) 97/46 100/71  Pulse: 83 78  Resp: 17 18  Temp: 36.9 C 36.9 C  SpO2: 100% 100%    Last Pain:  Vitals:   08/18/23 1131  TempSrc: Oral  PainSc: 0-No pain                 Treyden Hakim L Orene Abbasi

## 2023-08-18 NOTE — Transfer of Care (Signed)
 Immediate Anesthesia Transfer of Care Note  Patient: Sarah Walker  Procedure(s) Performed: EGD (ESOPHAGOGASTRODUODENOSCOPY)  Patient Location: PACU  Anesthesia Type:MAC  Level of Consciousness: awake and alert   Airway & Oxygen Therapy: Patient Spontanous Breathing and Patient connected to nasal cannula oxygen  Post-op Assessment: Report given to RN and Post -op Vital signs reviewed and stable  Post vital signs: Reviewed and stable  Last Vitals:  Vitals Value Taken Time  BP    Temp    Pulse    Resp    SpO2      Last Pain:  Vitals:   08/18/23 1107  PainSc: 0-No pain         Complications: No notable events documented.

## 2023-08-18 NOTE — H&P (Signed)
 Primary Care Physician:  Shona Norleen PEDLAR, MD Primary Gastroenterologist:  Dr. Cinderella  Pre-Procedure History & Physical: HPI:   Sarah Walker is a 73 year old female with history of ovarian cancer status post complete hysterectomy and chemo, psoriasis, hypertension, who is here for a surveillance upper endoscopy with history of  massive upper GI bleed due to duodenal ulcer status post EGD and embolization .   04/2023  - Normal esophagus. - Gastritis. Biopsied. - Duodenal foreign body in the bulb. This appears to be coils from embolization at the same area where previous ulcer was seen. No ulcer seen - Normal second portion of the duodenum. - I discussed case with IR on call ( Dr Luverne) , who thinks that these appears to be coils eroded through the wall of the duodenum. Given patient is asymptomatic without any overt bleeding , they recommend to monitor this for now and treat it as an ulcer , may need repeat EGD or CTA in future   Past Medical History:  Diagnosis Date   Abdominal hernia    Allergic rhinitis    Arthritis    Bone spur of other site    left foot   Dry eyes    Family history of kidney cancer    Family history of prostate cancer    Hypertension    Ovarian cancer (HCC)    Ovarian cancer on right (HCC) 06/17/2011   Peripheral neuropathy 08/26/2011   Chemotherapy-induced   Pulmonary embolism (HCC)    Sinusitis     Past Surgical History:  Procedure Laterality Date   ABDOMINAL HYSTERECTOMY  05/25/2011   [place] bs&o and cancer staging   CHOLECYSTECTOMY  01/05/1988   COLONOSCOPY N/A 09/06/2014   Procedure: COLONOSCOPY;  Surgeon: Claudis RAYMOND Rivet, MD;  [facility];  Service: Endoscopy;  Laterality: N/A;  930 - moved to 9/2 @ 8:25   COLONOSCOPY N/A 04/14/2023   Procedure: COLONOSCOPY;  Surgeon: Cinderella Deatrice FALCON, MD;  [facility];  Service: Endoscopy;  Laterality: N/A;  10:00AM;ASA 1-2   ESOPHAGOGASTRODUODENOSCOPY N/A 04/14/2023   Procedure: EGD  (ESOPHAGOGASTRODUODENOSCOPY);  Surgeon: Cinderella Deatrice FALCON, MD;  [facility];  Service: Endoscopy;  Laterality: N/A;  10:00AM;ASA 1-2   ESOPHAGOGASTRODUODENOSCOPY (EGD) WITH PROPOFOL  N/A 02/10/2023   Procedure: ESOPHAGOGASTRODUODENOSCOPY (EGD) WITH PROPOFOL ;  Surgeon: Cinderella Deatrice FALCON, MD;  [facility];  Service: Endoscopy;  Laterality: N/A;   I & D EXTREMITY Left 12/13/2021   Procedure: IRRIGATION AND DEBRIDEMENT ANKLE/ LEG;  Surgeon: Beverley Evalene BIRCH, MD;  [facility];  Service: Orthopedics;  Laterality: Left;   INCISIONAL HERNIA REPAIR  10/15/2014   INCISIONAL HERNIA REPAIR  10/15/2014   Procedure: OPEN INCISIONAL HERNIA REPAIR WITH MESH AND MYOFASCIAL FLAPS;  Surgeon: Debby Shipper, MD;  [facility];  Service: General;;   IR ANGIOGRAM SELECTIVE EACH ADDITIONAL VESSEL  02/11/2023   IR ANGIOGRAM VISCERAL SELECTIVE  02/11/2023   IR EMBO ART  VEN HEMORR LYMPH EXTRAV  INC GUIDE ROADMAPPING  02/11/2023   IR US  GUIDE VASC ACCESS RIGHT  02/11/2023   IVC filter  05/24/2011   PORT-A-CATH REMOVAL Right 11/21/2013   Procedure: MINOR REMOVAL PORT-A-CATH;  Surgeon: Oneil DELENA Budge, MD;  [facility]: [facility];  Service: General;  Laterality: Right;   PORTACATH PLACEMENT  06/16/2011   Done at [facility]   ROTATOR CUFF REPAIR Right    SCLEROTHERAPY  02/10/2023   Procedure: MATIAS;  Surgeon: Cinderella Deatrice FALCON, MD;  [facility];  Service: Endoscopy;;   TONSILLECTOMY     age 68's   TOTAL HIP ARTHROPLASTY Right 08/26/2020   Procedure: TOTAL HIP ARTHROPLASTY ANTERIOR APPROACH;  Surgeon: Beverley Evalene BIRCH, MD;  [facility];  Service: Orthopedics;  Laterality: Right;   TOTAL KNEE ARTHROPLASTY Left 07/20/2022   Procedure: TOTAL KNEE ARTHROPLASTY;  Surgeon: Beverley Evalene BIRCH, MD;  [facility];  Service: Orthopedics;  Laterality: Left;   TUBAL LIGATION  01/05/1979    Prior to Admission medications   Medication Sig Start Date End Date Taking? Authorizing  Provider  Azelastine HCl 137 MCG/SPRAY SOLN Place 2 sprays into both nostrils 2 (two) times daily. 07/11/23  Yes [provider]  Fexofenadine-Pseudoephedrine (ALLEGRA-D PO) Take by mouth.   Yes [provider]  folic acid  (FOLVITE ) 1 MG tablet Take 1 mg by mouth daily. 02/16/23  Yes [provider]  KLOR-CON  M20 20 MEQ tablet TAKE 1 TABLET BY MOUTH EVERY DAY 11/30/16  Yes Letha Truman ORN, NP  pantoprazole  (PROTONIX ) 40 MG tablet TAKE 1 TABLET BY MOUTH TWICE A DAY 03/21/23  Yes Aleaha Fickling, Deatrice FALCON, MD  valsartan -hydrochlorothiazide  (DIOVAN -HCT) 320-12.5 MG tablet Take 1 tablet by mouth daily. 05/21/22  Yes [provider]  vitamin B-12 100 MCG tablet Take 1 tablet (100 mcg total) by mouth daily. 02/15/23  Yes Sheikh, Omair Latif, DO  Vitamin D, Ergocalciferol, (DRISDOL) 1.25 MG (50000 UNIT) CAPS capsule Take 50,000 Units by mouth every 7 (seven) days.   Yes [provider]  Methotrexate, PF, (RASUVO) 10 MG/0.2ML SOAJ 10mg  sub injection every 30 days 02/22/23   [provider]    Allergies as of 08/02/2023 - Review Complete 07/26/2023  Allergen Reaction Noted   Erythromycin Itching 12/11/2021   Codeine Nausea And Vomiting 06/17/2011    Family History  Problem Relation Age of Onset   Stroke Mother    Cervical cancer Mother 48   Kidney cancer Father 54   Aneurysm Paternal Grandmother        66s   Hodgkin's lymphoma Maternal Uncle 33   Prostate cancer Paternal Uncle    Throat cancer Paternal Uncle    Cancer Cousin        2 female maternal first cousins with unknown cancer   Colon cancer Neg Hx    Stomach cancer Neg Hx     Social History   Socioeconomic History   Marital status: Married    Spouse name: Not on file   Number of children: Not on file   Years of education: Not on file   Highest education level: Not on file  Occupational History   Not on file  Tobacco Use   Smoking status: Never   Smokeless tobacco: Never  Vaping Use    Vaping status: Never Used  Substance and Sexual Activity   Alcohol use: No   Drug use: No   Sexual activity: Yes    Birth control/protection: Surgical    Comment: hyst  Other Topics Concern   Not on file  Social History Narrative   Not on file   Social Drivers of Health   Financial Resource Strain: Not on file  Food Insecurity: No Food Insecurity (02/10/2023)   Hunger Vital Sign    Worried About Running Out of Food in the Last Year: Never true    Ran Out of Food in the Last Year: Never true  Transportation Needs: No Transportation Needs (02/10/2023)   PRAPARE - Administrator, Civil Service (Medical): No  Lack of Transportation (Non-Medical): No  Physical Activity: Not on file  Stress: Not on file  Social Connections: Socially Integrated (02/10/2023)   Social Connection and Isolation Panel    Frequency of Communication with Friends and Family: Once a week    Frequency of Social Gatherings with Friends and Family: More than three times a week    Attends Religious Services: 1 to 4 times per year    Active Member of [employer] or Organizations: No    Attends Engineer, structural: More than 4 times per year    Marital Status: Married  Catering manager Violence: Not At Risk (02/10/2023)   Humiliation, Afraid, Rape, and Kick questionnaire    Fear of Current or Ex-Partner: No    Emotionally Abused: No    Physically Abused: No    Sexually Abused: No    Review of Systems: See HPI, otherwise negative ROS  Physical Exam: Vital signs in last 24 hours: Temp:  [98.3 F (36.8 C)] 98.3 F (36.8 C) (08/14 0833) Resp:  [17] 17 (08/14 0833) BP: (123)/(82) 123/82 (08/14 0833) SpO2:  [98 %] 98 % (08/14 0833)   General:   Alert,  Well-developed, well-nourished, pleasant and cooperative in NAD Head:  Normocephalic and atraumatic. Eyes:  Sclera clear, no icterus.   Conjunctiva pink. Ears:  Normal auditory acuity. Nose:  No deformity, discharge,  or lesions. Msk:   Symmetrical without gross deformities. Normal posture. Extremities:  Without clubbing or edema. Neurologic:  Alert and  oriented x4;  grossly normal neurologically. Skin:  Intact without significant lesions or rashes. Psych:  Alert and cooperative. Normal mood and affect.  Impression/Plan: Sarah Walker is a 73 year old female with history of ovarian cancer status post complete hysterectomy and chemo, psoriasis, hypertension, who is here for a surveillance upper endoscopy given history of PUD to assess healing of ulcer/lesion  The risks of the procedure including infection, bleed, or perforation as well as benefits, limitations, alternatives and imponderables have been reviewed with the patient. Questions have been answered. All parties agreeable.

## 2023-08-19 ENCOUNTER — Encounter (HOSPITAL_COMMUNITY): Payer: Self-pay | Admitting: Gastroenterology

## 2023-08-22 LAB — SURGICAL PATHOLOGY

## 2023-08-26 ENCOUNTER — Telehealth (INDEPENDENT_AMBULATORY_CARE_PROVIDER_SITE_OTHER): Payer: Self-pay | Admitting: Gastroenterology

## 2023-08-26 NOTE — Telephone Encounter (Signed)
 Pt left voicemail stating she had EGD on 08/18/23 and Dr.Ahmed placed her on Protonix  and he may decrease it. Pt is wanting to know does she stay on 40 mg BID or is provider decreasing. Please advise. Thank you.

## 2023-08-29 ENCOUNTER — Ambulatory Visit (INDEPENDENT_AMBULATORY_CARE_PROVIDER_SITE_OTHER): Payer: Self-pay | Admitting: Gastroenterology

## 2023-08-30 NOTE — Telephone Encounter (Signed)
 Pt contacted and verbalized understanding.

## 2023-08-30 NOTE — Progress Notes (Signed)
 Patient result letter mailed procedure note and pathology result faxed to PCP

## 2023-09-01 DIAGNOSIS — M79641 Pain in right hand: Secondary | ICD-10-CM | POA: Diagnosis not present

## 2023-09-01 DIAGNOSIS — R5383 Other fatigue: Secondary | ICD-10-CM | POA: Diagnosis not present

## 2023-09-01 DIAGNOSIS — L4050 Arthropathic psoriasis, unspecified: Secondary | ICD-10-CM | POA: Diagnosis not present

## 2023-09-01 DIAGNOSIS — Z79899 Other long term (current) drug therapy: Secondary | ICD-10-CM | POA: Diagnosis not present

## 2023-09-01 DIAGNOSIS — M1991 Primary osteoarthritis, unspecified site: Secondary | ICD-10-CM | POA: Diagnosis not present

## 2023-09-01 DIAGNOSIS — E559 Vitamin D deficiency, unspecified: Secondary | ICD-10-CM | POA: Diagnosis not present

## 2023-09-06 DIAGNOSIS — M25512 Pain in left shoulder: Secondary | ICD-10-CM | POA: Diagnosis not present

## 2023-10-03 ENCOUNTER — Encounter (INDEPENDENT_AMBULATORY_CARE_PROVIDER_SITE_OTHER): Payer: Self-pay | Admitting: Gastroenterology

## 2023-10-03 ENCOUNTER — Ambulatory Visit (INDEPENDENT_AMBULATORY_CARE_PROVIDER_SITE_OTHER): Admitting: Gastroenterology

## 2023-10-03 ENCOUNTER — Other Ambulatory Visit: Payer: Self-pay | Admitting: Gastroenterology

## 2023-10-03 VITALS — BP 112/69 | HR 67 | Temp 98.1°F | Ht 60.0 in | Wt 170.9 lb

## 2023-10-03 DIAGNOSIS — K635 Polyp of colon: Secondary | ICD-10-CM

## 2023-10-03 DIAGNOSIS — K269 Duodenal ulcer, unspecified as acute or chronic, without hemorrhage or perforation: Secondary | ICD-10-CM

## 2023-10-03 DIAGNOSIS — Z8719 Personal history of other diseases of the digestive system: Secondary | ICD-10-CM | POA: Diagnosis not present

## 2023-10-03 DIAGNOSIS — K279 Peptic ulcer, site unspecified, unspecified as acute or chronic, without hemorrhage or perforation: Secondary | ICD-10-CM | POA: Diagnosis not present

## 2023-10-03 DIAGNOSIS — Z860100 Personal history of colon polyps, unspecified: Secondary | ICD-10-CM | POA: Diagnosis not present

## 2023-10-03 DIAGNOSIS — K264 Chronic or unspecified duodenal ulcer with hemorrhage: Secondary | ICD-10-CM

## 2023-10-03 MED ORDER — PANTOPRAZOLE SODIUM 40 MG PO TBEC: 40.0000 mg | DELAYED_RELEASE_TABLET | Freq: Every day | ORAL | 5 refills | Status: AC

## 2023-10-03 NOTE — Patient Instructions (Signed)
 It was very nice to meet you today, as dicussed with will plan for the following :  1)  -Avoid using high dose aspirin  including Goody/BC powders, NSAIDs such as Aleve, ibuprofen, naproxen, Motrin, Voltaren or Advil (even the topical ones)   2) protonix  40 mg 30 min before breakfast   3) See interventional radiologist

## 2023-10-03 NOTE — Progress Notes (Signed)
 Eldor Conaway Faizan Yolando Gillum , M.D. Gastroenterology & Hepatology [facility] Gastroenterology [address] [place], [place] [address] Primary Care Physician: Shona Norleen PEDLAR, MD [address] [place] [address]  Chief Complaint: Peptic ulcer disease  History of Present Illness:  Sarah Walker is a 73 year old female with history of ovarian cancer status post complete hysterectomy and chemo, psoriasis, hypertension, who is here for a follow up with history of  massive upper GI bleed due to duodenal ulcer status post EGD and embolization here for a follow up   Patient underwent 2 upper endoscopies for surveillance where vascular coil was seen through the duodenal bulb wall without any underlying ulcer or bleeding.Patient was doing well,reports that her stools are usually brown no black tarry stools.  Has 1-2 bowel movement daily patient does report taking meloxicam for many years and aspirin  81 mg which she has stopped since last hospitalization .  he patient denies having any nausea, vomiting, fever, chills, hematochezia, melena, hematemesis, abdominal distention, abdominal pain, diarrhea, jaundice, pruritus or weight loss.  History of massive UGIB   Patient had of melena in the setting of meloxicam use.  Patient was found to have significant drop in her hemoglobin down to 7.6.  Underwent EGD on 02/10/2023 Which showed presence of large amount of clots in the stomach, with presence of a nonobstructing cratered duodenal ulcer with adherent clot the duodenal bulb measuring, ulcer measured close to 20 mm.  This was injected with epinephrine .  CT angio bleed performed on the same day showed presence of small less than esophagogastroduodenospy focus seen anterior to the gastroduodenal branch of the celiac artery, along with inflammatory changes in the distal stomach and proximal duodenum.  Due to this, the patient underwent cold embolization of the gastroduodenal artery .   Following this patient had further episodes of hematochezia with drop in hemoglobin and was transferred back to [facility], ICU where repeat CTA did not demonstrate any extravasation and patient was at that time treated conservatively without any further bleeding and hence was discharged.  Patient required total of 7 units PRBC and 1 unit FFP    Last EGD:08/2023  - Normal esophagus. - Erythematous mucosa in the antrum. Biopsied. - Duodenal foreign body. . This appears to be coils from embolization at the same area where previous ulcer was seen. No ulcer seen - Normal second portion of the duodenum. - I discussed case with IR ( Dr Luverne) , who adviced to monitor this for now and no intervention required. Given patient is asymptomatic without any overt bleeding , they recommend to monitor this for now .  A. STOMACH BIOPSY:  -  Antral/oxyntic mucosa with features of both a moderate chronic  inactive gastritis and chemical/reactive change.  -  An immunohistochemical stain for Helicobacter pylori organisms is  negative.   02/2024 - Normal esophagus. - Large amount of Clotted blood in the entire stomach, unable to be suctioned hence inadequate exam - Non- obstructing large duodenal ulcer with large adherent clot; unroofing was not attempted. Perforation of the bowel wall cannot be ruled out. Injected. This is either large peptic ulcer vs mass vs walled off perforation - No specimens collected.  Last Colonoscopy:04/2023  - One 6 mm polyp at the hepatic flexure, removed with a cold snare. Resected and retrieved. - Stool in the entire examined colon. - Non- bleeding internal hemorrhoids.   Last labs from 02/14/2023 with hemoglobin 10.1 platelet 197 Interestingly her alk phos is very  low 34 AST ALT normal Ferritin 74  Past Medical History: Past Medical History:  Diagnosis Date   Abdominal hernia    Allergic rhinitis    Arthritis    Bone spur of other site    left foot   Dry eyes    Family  history of kidney cancer    Family history of prostate cancer    Hypertension    Ovarian cancer (HCC)    Ovarian cancer on right (HCC) 06/17/2011   Peripheral neuropathy 08/26/2011   Chemotherapy-induced   Pulmonary embolism (HCC)    Sinusitis     Past Surgical History: Past Surgical History:  Procedure Laterality Date   ABDOMINAL HYSTERECTOMY  05/25/2011   [place] bs&o and cancer staging   CHOLECYSTECTOMY  01/05/1988   COLONOSCOPY N/A 09/06/2014   Procedure: COLONOSCOPY;  Surgeon: Claudis RAYMOND Rivet, MD;  [facility];  Service: Endoscopy;  Laterality: N/A;  930 - moved to 9/2 @ 8:25   COLONOSCOPY N/A 04/14/2023   Procedure: COLONOSCOPY;  Surgeon: Cinderella Deatrice FALCON, MD;  [facility];  Service: Endoscopy;  Laterality: N/A;  10:00AM;ASA 1-2   ESOPHAGOGASTRODUODENOSCOPY N/A 04/14/2023   Procedure: EGD (ESOPHAGOGASTRODUODENOSCOPY);  Surgeon: Cinderella Deatrice FALCON, MD;  [facility];  Service: Endoscopy;  Laterality: N/A;  10:00AM;ASA 1-2   ESOPHAGOGASTRODUODENOSCOPY N/A 08/18/2023   Procedure: EGD (ESOPHAGOGASTRODUODENOSCOPY);  Surgeon: Cinderella Deatrice FALCON, MD;  [facility];  Service: Endoscopy;  Laterality: N/A;  1015am, asa 1-2   ESOPHAGOGASTRODUODENOSCOPY (EGD) WITH PROPOFOL  N/A 02/10/2023   Procedure: ESOPHAGOGASTRODUODENOSCOPY (EGD) WITH PROPOFOL ;  Surgeon: Cinderella Deatrice FALCON, MD;  [facility];  Service: Endoscopy;  Laterality: N/A;   I & D EXTREMITY Left 12/13/2021   Procedure: IRRIGATION AND DEBRIDEMENT ANKLE/ LEG;  Surgeon: Beverley Evalene BIRCH, MD;  [facility];  Service: Orthopedics;  Laterality: Left;   INCISIONAL HERNIA REPAIR  10/15/2014   INCISIONAL HERNIA REPAIR  10/15/2014   Procedure: OPEN INCISIONAL HERNIA REPAIR WITH MESH AND MYOFASCIAL FLAPS;  Surgeon: Debby Shipper, MD;  [facility];  Service: General;;   IR ANGIOGRAM SELECTIVE EACH ADDITIONAL VESSEL  02/11/2023   IR ANGIOGRAM VISCERAL SELECTIVE  02/11/2023   IR EMBO ART   VEN HEMORR LYMPH EXTRAV  INC GUIDE ROADMAPPING  02/11/2023   IR US  GUIDE VASC ACCESS RIGHT  02/11/2023   IVC filter  05/24/2011   PORT-A-CATH REMOVAL Right 11/21/2013   Procedure: MINOR REMOVAL PORT-A-CATH;  Surgeon: Oneil DELENA Budge, MD;  [facility]: [facility];  Service: General;  Laterality: Right;   PORTACATH PLACEMENT  06/16/2011   Done at [facility]   ROTATOR CUFF REPAIR Right    SCLEROTHERAPY  02/10/2023   Procedure: MATIAS;  Surgeon: Cinderella Deatrice FALCON, MD;  [facility];  Service: Endoscopy;;   TONSILLECTOMY     age 38's   TOTAL HIP ARTHROPLASTY Right 08/26/2020   Procedure: TOTAL HIP ARTHROPLASTY ANTERIOR APPROACH;  Surgeon: Beverley Evalene BIRCH, MD;  [facility];  Service: Orthopedics;  Laterality: Right;   TOTAL KNEE ARTHROPLASTY Left 07/20/2022   Procedure: TOTAL KNEE ARTHROPLASTY;  Surgeon: Beverley Evalene BIRCH, MD;  [facility];  Service: Orthopedics;  Laterality: Left;   TUBAL LIGATION  01/05/1979    Family History: Family History  Problem Relation Age of Onset   Stroke Mother    Cervical cancer Mother 51   Kidney cancer Father 8   Aneurysm Paternal Grandmother        79s   Hodgkin's  lymphoma Maternal Uncle 33   Prostate cancer Paternal Uncle    Throat cancer Paternal Uncle    Cancer Cousin        2 female maternal first cousins with unknown cancer   Colon cancer Neg Hx    Stomach cancer Neg Hx     Social History: Social History   Tobacco Use  Smoking Status Never  Smokeless Tobacco Never   Social History   Substance and Sexual Activity  Alcohol Use No   Social History   Substance and Sexual Activity  Drug Use No    Allergies: Allergies  Allergen Reactions   Erythromycin Itching   Codeine Nausea And Vomiting    Not right away after a couple days of taking    Medications: Current Outpatient Medications  Medication Sig Dispense Refill   Azelastine HCl 137 MCG/SPRAY SOLN Place 2 sprays into both nostrils 2 (two) times daily.      Fexofenadine-Pseudoephedrine (ALLEGRA-D PO) Take by mouth.     folic acid  (FOLVITE ) 1 MG tablet Take 1 mg by mouth daily.     KLOR-CON  M20 20 MEQ tablet TAKE 1 TABLET BY MOUTH EVERY DAY 90 tablet 1   Methotrexate, PF, (RASUVO) 10 MG/0.2ML SOAJ 10mg  sub injection every 30 days     pantoprazole  (PROTONIX ) 40 MG tablet TAKE 1 TABLET BY MOUTH TWICE A DAY (Patient taking differently: Take 40 mg by mouth daily.) 180 tablet 3   valsartan -hydrochlorothiazide  (DIOVAN -HCT) 320-12.5 MG tablet Take 1 tablet by mouth daily.     vitamin B-12 100 MCG tablet Take 1 tablet (100 mcg total) by mouth daily. 30 tablet 0   Vitamin D, Ergocalciferol, (DRISDOL) 1.25 MG (50000 UNIT) CAPS capsule Take 50,000 Units by mouth every 7 (seven) days.     No current facility-administered medications for this visit.   Facility-Administered Medications Ordered in Other Visits  Medication Dose Route Frequency Provider Last Rate Last Admin   sodium chloride  0.9 % injection 10 mL  10 mL Intravenous PRN Kefalas, Thomas S, PA-C   10 mL at 08/31/12 1446   sodium chloride  0.9 % injection 10 mL  10 mL Intravenous PRN Lawernce Hacker, MD   10 mL at 09/12/13 1408    Review of Systems: GENERAL: negative for malaise, night sweats HEENT: No changes in hearing or vision, no nose bleeds or other nasal problems. NECK: Negative for lumps, goiter, pain and significant neck swelling RESPIRATORY: Negative for cough, wheezing CARDIOVASCULAR: Negative for chest pain, leg swelling, palpitations, orthopnea GI: SEE HPI MUSCULOSKELETAL: Negative for joint pain or swelling, back pain, and muscle pain. SKIN: Negative for lesions, rash HEMATOLOGY Negative for prolonged bleeding, bruising easily, and swollen nodes. ENDOCRINE: Negative for cold or heat intolerance, polyuria, polydipsia and goiter. NEURO: negative for tremor, gait imbalance, syncope and seizures. The remainder of the review of systems is noncontributory.   Physical Exam: BP  112/69   Pulse 67   Temp 98.1 F (36.7 C)   Ht 5' (1.524 m)   Wt 170 lb 14.4 oz (77.5 kg)   BMI 33.38 kg/m  GENERAL: The patient is AO x3, in no acute distress. HEENT: Head is normocephalic and atraumatic. EOMI are intact. Mouth is well hydrated and without lesions. NECK: Supple. No masses LUNGS: Clear to auscultation. No presence of rhonchi/wheezing/rales. Adequate chest expansion HEART: RRR, normal s1 and s2. ABDOMEN: Soft, nontender, no guarding, no peritoneal signs, and nondistended. BS +. No masses.  Imaging/Labs: as above     Latest Ref Rng &  Units 02/18/2023   11:40 AM 02/14/2023    2:33 AM 02/13/2023    3:27 PM  CBC  WBC 3.4 - 10.8 x10E3/uL 7.1  7.8  9.1   Hemoglobin 11.1 - 15.9 g/dL 88.2  89.8  89.8   Hematocrit 34.0 - 46.6 % 36.9  30.0  30.9   Platelets 150 - 450 x10E3/uL 244  197  185    Lab Results  Component Value Date   IRON 86 02/10/2023   TIBC 249 (L) 02/10/2023   FERRITIN 74 02/10/2023    I personally reviewed and interpreted the available labs, imaging and endoscopic files.  Negative intrinsic factor antibody .  Negative antiparietal antibody  H Pylori  stool testing was negative  Impression and Plan:  Lailie Smead is a 73 year old female with history of ovarian cancer status post complete hysterectomy and chemo, psoriasis, hypertension, who is here for a follow up with history of  massive upper GI bleed due to duodenal ulcer status post EGD and embolization here for a follow up   #History Duodenal ulcer with massive upper GI bleed  Likely cause of peptic ulcer disease meloxicam and aspirin  .negative H Pylori   Patient underwent 2 upper endoscopies for surveillance where vascular coil was seen through the duodenal bulb wall without any underlying ulcer or bleeding  -Avoid using high dose aspirin  including Goody/BC powders, NSAIDs such as Aleve, ibuprofen, naproxen, Motrin, Voltaren or Advil (even the topical ones)  Patient was re-assured regarding  PPI use and safety of when appropriate indications in question. Most recent studies on PPI therapy that association of symptoms is not equivalent to causation and overall association with for example osteoporosis is weak and based on observational studies. When PPI use is indicated, it is safe to proceed with therapy and titrate dosing/use based on symptom response.  C/w PPI daily   -Patient had several questions about the vascular coils, I had reassured her as after procedure I reached out to IR and they mentioned no intervention required . I also gave patient option for referral to IR so she can speak with them regarding this finding . Patient would like to speak to IR regarding vascular coil seen through the duodenal bulb on 2 upper endoscopies, without any underlying ulcer or bleeding  -Patient is up to date to colonoscopy   All questions were answered.      Sarah Walker Faizan Dravyn Severs, MD Gastroenterology and Hepatology [facility]   This chart has been completed using [facility] Dictation software, and while attempts have been made to ensure accuracy , certain words and phrases may not be transcribed as intended

## 2023-10-12 ENCOUNTER — Other Ambulatory Visit

## 2023-11-01 NOTE — Progress Notes (Signed)
 Reason for visit: Hx UGIB s/p coil embolization. Coils within duodenum, req to discuss w Pt    Care Team(s) Primary Care: Shona Norleen PEDLAR, MD Gastroenterology: Cinderella Deatrice FALCON MD  History of present illness:  Sarah Walker is a 73 y/o F comorbid w PMHx significant for massive UGIB w hemodynamic instability leading to hospitalization and ICU admission. Pt known to me s/p mesenteric arteriography and coil embolization of GDA on 02/11/23, with success. Pt was discharged from hospital on 02/14/23 and has been closely followed by her GI in [place] w serial endoscopies, most recently 08/18/23 w noted endoluminal coils noted.   His GI contacted the VIR service and had spoken w my colleague, Dr Luverne, who reassured him of the findings. He discussed it with the Pt and she requested a visit to understand the implcations of the findings, leadiang to the referral. Pt is joined by her son, Sarah Walker, in the visit. She is in otherwise good health.   Review of Systems: A 12 point ROS discussed and pertinent positives are indicated in the HPI above.  All other systems are negative.   Past Medical History:  Diagnosis Date   Abdominal hernia    Allergic rhinitis    Arthritis    Bone spur of other site    left foot   Dry eyes    Family history of kidney cancer    Family history of prostate cancer    Hypertension    Ovarian cancer (HCC)    Ovarian cancer on right (HCC) 06/17/2011   Peripheral neuropathy 08/26/2011   Chemotherapy-induced   Pulmonary embolism (HCC)    Sinusitis     Past Surgical History:  Procedure Laterality Date   ABDOMINAL HYSTERECTOMY  05/25/2011   [place] bs&o and cancer staging   CHOLECYSTECTOMY  01/05/1988   COLONOSCOPY N/A 09/06/2014   Procedure: COLONOSCOPY;  Surgeon: Claudis RAYMOND Rivet, MD;  [facility];  Service: Endoscopy;  Laterality: N/A;  930 - moved to 9/2 @ 8:25   COLONOSCOPY N/A 04/14/2023   Procedure: COLONOSCOPY;  Surgeon: Cinderella Deatrice FALCON, MD;   [facility]: [facility];  Service: Endoscopy;  Laterality: N/A;  10:00AM;ASA 1-2   ESOPHAGOGASTRODUODENOSCOPY N/A 04/14/2023   Procedure: EGD (ESOPHAGOGASTRODUODENOSCOPY);  Surgeon: Cinderella Deatrice FALCON, MD;  [facility];  Service: Endoscopy;  Laterality: N/A;  10:00AM;ASA 1-2   ESOPHAGOGASTRODUODENOSCOPY N/A 08/18/2023   Procedure: EGD (ESOPHAGOGASTRODUODENOSCOPY);  Surgeon: Cinderella Deatrice FALCON, MD;  [facility];  Service: Endoscopy;  Laterality: N/A;  1015am, asa 1-2   ESOPHAGOGASTRODUODENOSCOPY (EGD) WITH PROPOFOL  N/A 02/10/2023   Procedure: ESOPHAGOGASTRODUODENOSCOPY (EGD) WITH PROPOFOL ;  Surgeon: Cinderella Deatrice FALCON, MD;  [facility];  Service: Endoscopy;  Laterality: N/A;   I & D EXTREMITY Left 12/13/2021   Procedure: IRRIGATION AND DEBRIDEMENT ANKLE/ LEG;  Surgeon: Beverley Evalene BIRCH, MD;  Location: [facility];  Service: Orthopedics;  Laterality: Left;   INCISIONAL HERNIA REPAIR  10/15/2014   INCISIONAL HERNIA REPAIR  10/15/2014   Procedure: OPEN INCISIONAL HERNIA REPAIR WITH MESH AND MYOFASCIAL FLAPS;  Surgeon: Debby Shipper, MD;  Location: [facility];  Service: General;;   IR ANGIOGRAM SELECTIVE EACH ADDITIONAL VESSEL  02/11/2023   IR ANGIOGRAM VISCERAL SELECTIVE  02/11/2023   IR EMBO ART  VEN HEMORR LYMPH EXTRAV  INC GUIDE ROADMAPPING  02/11/2023   IR RADIOLOGIST EVAL & MGMT  11/02/2023   IR US  GUIDE VASC ACCESS RIGHT  02/11/2023   IVC filter  05/24/2011  PORT-A-CATH REMOVAL Right 11/21/2013   Procedure: MINOR REMOVAL PORT-A-CATH;  Surgeon: Oneil DELENA Budge, MD;  Location: [facility];  Service: General;  Laterality: Right;   PORTACATH PLACEMENT  06/16/2011   Done at [facility]   ROTATOR CUFF REPAIR Right    SCLEROTHERAPY  02/10/2023   Procedure: MATIAS;  Surgeon: Cinderella Deatrice FALCON, MD;  [facility]: [facility];  Service: Endoscopy;;   TONSILLECTOMY     age 1's   TOTAL HIP ARTHROPLASTY Right 08/26/2020   Procedure: TOTAL HIP ARTHROPLASTY ANTERIOR APPROACH;  Surgeon:  Beverley Evalene BIRCH, MD;  Location: [facility];  Service: Orthopedics;  Laterality: Right;   TOTAL KNEE ARTHROPLASTY Left 07/20/2022   Procedure: TOTAL KNEE ARTHROPLASTY;  Surgeon: Beverley Evalene BIRCH, MD;  Location: [facility];  Service: Orthopedics;  Laterality: Left;   TUBAL LIGATION  01/05/1979    Allergies: Erythromycin and Codeine  Medications: Prior to Admission medications   Medication Sig Start Date End Date Taking? Authorizing Provider  Azelastine HCl 137 MCG/SPRAY SOLN Place 2 sprays into both nostrils 2 (two) times daily. 07/11/23   [provider]  Fexofenadine-Pseudoephedrine (ALLEGRA-D PO) Take by mouth.    [provider]  folic acid  (FOLVITE ) 1 MG tablet Take 1 mg by mouth daily. 02/16/23   [provider]  KLOR-CON  M20 20 MEQ tablet TAKE 1 TABLET BY MOUTH EVERY DAY 11/30/16   Letha Truman ORN, NP  Methotrexate, PF, (RASUVO) 10 MG/0.2ML SOAJ 10mg  sub injection every 30 days 02/22/23   [provider]  pantoprazole  (PROTONIX ) 40 MG tablet Take 1 tablet (40 mg total) by mouth daily. 10/03/23 03/31/24  Ahmed, Muhammad F, MD  valsartan -hydrochlorothiazide  (DIOVAN -HCT) 320-12.5 MG tablet Take 1 tablet by mouth daily. 05/21/22   [provider]  vitamin B-12 100 MCG tablet Take 1 tablet (100 mcg total) by mouth daily. 02/15/23   Sheikh, Omair Latif, DO  Vitamin D, Ergocalciferol, (DRISDOL) 1.25 MG (50000 UNIT) CAPS capsule Take 50,000 Units by mouth every 7 (seven) days.    [provider]     Family History  Problem Relation Age of Onset   Stroke Mother    Cervical cancer Mother 78   Kidney cancer Father 57   Aneurysm Paternal Grandmother        55s   Hodgkin's lymphoma Maternal Uncle 33   Prostate cancer Paternal Uncle    Throat cancer Paternal Uncle    Cancer Cousin        2 female maternal first cousins with unknown cancer   Colon cancer Neg Hx    Stomach cancer Neg Hx     Social History   Socioeconomic History   Marital  status: Married    Spouse name: Not on file   Number of children: Not on file   Years of education: Not on file   Highest education level: Not on file  Occupational History   Not on file  Tobacco Use   Smoking status: Never   Smokeless tobacco: Never  Vaping Use   Vaping status: Never Used  Substance and Sexual Activity   Alcohol use: No   Drug use: No   Sexual activity: Yes    Birth control/protection: Surgical    Comment: hyst  Other Topics Concern   Not on file  Social History Narrative   Not on file   Social Drivers of Health   Financial Resource Strain: Not on file  Food Insecurity: No Food Insecurity (02/10/2023)   Hunger Vital Sign  Worried About Programme Researcher, Broadcasting/film/video in the Last Year: Never true    Ran Out of Food in the Last Year: Never true  Transportation Needs: No Transportation Needs (02/10/2023)   PRAPARE - Administrator, Civil Service (Medical): No    Lack of Transportation (Non-Medical): No  Physical Activity: Not on file  Stress: Not on file  Social Connections: Socially Integrated (02/10/2023)   Social Connection and Isolation Panel    Frequency of Communication with Friends and Family: Once a week    Frequency of Social Gatherings with Friends and Family: More than three times a week    Attends Religious Services: 1 to 4 times per year    Active Member of [employer] or Organizations: No    Attends Engineer, Structural: More than 4 times per year    Marital Status: Married     Vital Signs: BP 139/83 (BP Location: Left Arm, Patient Position: Sitting, Cuff Size: Normal)   Pulse 60   Temp (!) 97.5 F (36.4 C) (Oral)   Resp 18   Physical Exam  General: WN, NAD  CV: RRR on monitor Pulm: normal work of breathing on RA Abd: S, ND, NT MSK: Grossly normal Psych: Appropriate affect.  Imaging:    EGD, 08/18/23 Impression:  - Normal esophagus. - Erythematous mucosa in the antrum. Biopsied.  - Duodenal foreign body. This appears to  be coils from embolization at the same area where previous ulcer was seen. No ulcer seen  - Normal second portion of the duodenum.     CTA [facility], 02/12/23 IMPRESSION:  VASCULAR   1. No evidence of active bleeding at the time of imaging.  2. Successful coil embolization of the gastroduodenal and proximal right gastroepiploic arteries.  3. Nonocclusive helical material present within the proximal splenic artery, throughout the common hepatic artery and within the proximal proper hepatic artery.  4. Scattered atherosclerotic plaque. Aortic Atherosclerosis (ICD10-I70.0).   NON-VASCULAR   1. No acute abnormality within the abdomen or pelvis.   IR GDA Embolization, 02/11/23 IMPRESSION:  1. Successful coil embolization of the gastroduodenal artery for persistent upper GI bleed.   2. No angiographic evidence of active contrast extravasation.   3. Retained foreign body with the coil deployment string mechanism lodged within the [facility].  No results found.  Labs:  CBC: Recent Labs    02/13/23 0240 02/13/23 1527 02/14/23 0233 02/18/23 1140  WBC 7.3 9.1 7.8 7.1  HGB 9.5* 10.1* 10.1* 11.7  HCT 27.9* 30.9* 30.0* 36.9  PLT 180 185 197 244    COAGS: Recent Labs    02/11/23 0935 02/11/23 1839  INR 1.1 1.5*    BMP: Recent Labs    02/12/23 0212 02/13/23 0240 02/14/23 0233 02/18/23 1140 04/12/23 0921  NA 142 142 141 143 137  K 4.3 3.4* 3.8 4.3 3.9  CL 114* 113* 111 105 104  CO2 19* 19* 21* 26 26  GLUCOSE 93 94 95 86 89  BUN 44* 31* 18 11 26*  CALCIUM 7.7* 8.1* 8.5* 8.6* 9.0  CREATININE 0.87 0.93 0.76 1.17* 0.90  GFRNONAA >60 >60 >60  --  >60    Assessment and Plan:  73 y/o F comorbid w PMHx significant for massive UGIB w hemodynamic instability leading to hospitalization and ICU admission. Pt known to me s/p mesenteric arteriography and coil embolization of GDA on 02/11/23, with success. Pt was discharged from hospital on 02/14/23 and has been closely followed by her GI in  [place] w serial endoscopies, most recently 08/18/23 w noted endoluminal coils noted.    *reassured Pt and her Son that the coils do not pose a risk for re-bleeding, and should not propagate as they are anchored within her occluded GDA *No VIR concern or intervention at this time. Follow up PRN. *Continue routine GI follow up.   Thank you for allowing us  to participate in the care of this Patient. Please contact me with questions, concerns, or if new issues arise.  Electronically Signed:   Thom Hall, MD [facility]   Pager. [phone] Clinic. [phone]   I spent a total of 25 Minutes in face to face in clinical consultation, greater than 50% of which was counseling/coordinating care for Sarah Shawne Bulow [facility] management post upper GI bleed.

## 2023-11-02 ENCOUNTER — Ambulatory Visit
Admission: RE | Admit: 2023-11-02 | Discharge: 2023-11-02 | Disposition: A | Discharge status: Home / Self Care | Source: Ambulatory Visit | Attending: Gastroenterology | Admitting: Gastroenterology

## 2023-11-02 DIAGNOSIS — K279 Peptic ulcer, site unspecified, unspecified as acute or chronic, without hemorrhage or perforation: Secondary | ICD-10-CM

## 2023-11-02 HISTORY — PX: IR RADIOLOGIST EVAL & MGMT: IMG5224
